# Patient Record
Sex: Female | Born: 1950 | Race: Black or African American | Hispanic: No | Marital: Single | Smoking: Never smoker
Health system: Southern US, Community
[De-identification: ages and names within clinical notes are randomized; demographics above are authoritative.]

## PROBLEM LIST (undated history)

## (undated) DIAGNOSIS — N390 Urinary tract infection, site not specified: Secondary | ICD-10-CM

## (undated) DIAGNOSIS — I10 Essential (primary) hypertension: Secondary | ICD-10-CM

## (undated) DIAGNOSIS — T8859XA Other complications of anesthesia, initial encounter: Secondary | ICD-10-CM

## (undated) DIAGNOSIS — T884XXA Failed or difficult intubation, initial encounter: Secondary | ICD-10-CM

## (undated) DIAGNOSIS — D649 Anemia, unspecified: Secondary | ICD-10-CM

## (undated) DIAGNOSIS — Z9889 Other specified postprocedural states: Secondary | ICD-10-CM

## (undated) DIAGNOSIS — R112 Nausea with vomiting, unspecified: Secondary | ICD-10-CM

## (undated) DIAGNOSIS — C259 Malignant neoplasm of pancreas, unspecified: Secondary | ICD-10-CM

## (undated) DIAGNOSIS — R109 Unspecified abdominal pain: Secondary | ICD-10-CM

## (undated) DIAGNOSIS — T4145XA Adverse effect of unspecified anesthetic, initial encounter: Secondary | ICD-10-CM

## (undated) HISTORY — PX: TUBAL LIGATION: SHX77

## (undated) HISTORY — DX: Unspecified abdominal pain: R10.9

## (undated) HISTORY — PX: OOPHORECTOMY: SHX86

## (undated) HISTORY — PX: CHOLECYSTECTOMY: SHX55

---

## 2015-12-14 DIAGNOSIS — N281 Cyst of kidney, acquired: Secondary | ICD-10-CM | POA: Insufficient documentation

## 2015-12-14 DIAGNOSIS — I10 Essential (primary) hypertension: Secondary | ICD-10-CM | POA: Insufficient documentation

## 2016-05-18 DIAGNOSIS — D4959 Neoplasm of unspecified behavior of other genitourinary organ: Secondary | ICD-10-CM | POA: Insufficient documentation

## 2017-06-19 DIAGNOSIS — K8020 Calculus of gallbladder without cholecystitis without obstruction: Secondary | ICD-10-CM | POA: Insufficient documentation

## 2017-07-24 DIAGNOSIS — N183 Chronic kidney disease, stage 3 unspecified: Secondary | ICD-10-CM | POA: Insufficient documentation

## 2017-09-19 DIAGNOSIS — M4800 Spinal stenosis, site unspecified: Secondary | ICD-10-CM | POA: Insufficient documentation

## 2017-10-17 ENCOUNTER — Encounter: Payer: Self-pay | Admitting: Emergency Medicine

## 2017-10-17 ENCOUNTER — Emergency Department: Payer: Medicare Other

## 2017-10-17 ENCOUNTER — Emergency Department
Admission: EM | Admit: 2017-10-17 | Discharge: 2017-10-17 | Disposition: A | Discharge status: Home / Self Care | Payer: Medicare Other | Source: Home / Self Care | Attending: Emergency Medicine | Admitting: Emergency Medicine

## 2017-10-17 ENCOUNTER — Other Ambulatory Visit: Payer: Self-pay

## 2017-10-17 DIAGNOSIS — K8689 Other specified diseases of pancreas: Secondary | ICD-10-CM

## 2017-10-17 DIAGNOSIS — C259 Malignant neoplasm of pancreas, unspecified: Secondary | ICD-10-CM | POA: Diagnosis not present

## 2017-10-17 DIAGNOSIS — N2889 Other specified disorders of kidney and ureter: Secondary | ICD-10-CM | POA: Diagnosis not present

## 2017-10-17 DIAGNOSIS — M545 Low back pain: Secondary | ICD-10-CM | POA: Insufficient documentation

## 2017-10-17 DIAGNOSIS — N3000 Acute cystitis without hematuria: Secondary | ICD-10-CM

## 2017-10-17 DIAGNOSIS — K869 Disease of pancreas, unspecified: Secondary | ICD-10-CM

## 2017-10-17 DIAGNOSIS — N390 Urinary tract infection, site not specified: Secondary | ICD-10-CM

## 2017-10-17 LAB — COMPREHENSIVE METABOLIC PANEL
ALBUMIN: 4.1 g/dL (ref 3.5–5.0)
ALK PHOS: 111 U/L (ref 38–126)
ALT: 18 U/L (ref 0–44)
AST: 26 U/L (ref 15–41)
Anion gap: 13 (ref 5–15)
BILIRUBIN TOTAL: 0.8 mg/dL (ref 0.3–1.2)
BUN: 18 mg/dL (ref 8–23)
CALCIUM: 9.7 mg/dL (ref 8.9–10.3)
CO2: 21 mmol/L — ABNORMAL LOW (ref 22–32)
Chloride: 108 mmol/L (ref 98–111)
Creatinine, Ser: 1.24 mg/dL — ABNORMAL HIGH (ref 0.44–1.00)
GFR calc Af Amer: 51 mL/min — ABNORMAL LOW (ref >60)
GFR, EST NON AFRICAN AMERICAN: 44 mL/min — AB (ref >60)
GLUCOSE: 130 mg/dL — AB (ref 70–99)
POTASSIUM: 4 mmol/L (ref 3.5–5.1)
Sodium: 142 mmol/L (ref 135–145)
TOTAL PROTEIN: 8.3 g/dL — AB (ref 6.5–8.1)

## 2017-10-17 LAB — URINALYSIS, COMPLETE (UACMP) WITH MICROSCOPIC
Bilirubin Urine: NEGATIVE
GLUCOSE, UA: NEGATIVE mg/dL
Hgb urine dipstick: NEGATIVE
KETONES UR: 5 mg/dL — AB
NITRITE: NEGATIVE
PROTEIN: NEGATIVE mg/dL
Specific Gravity, Urine: 1.01 (ref 1.005–1.030)
pH: 5 (ref 5.0–8.0)

## 2017-10-17 LAB — CBC WITH DIFFERENTIAL/PLATELET
BASOS ABS: 0.1 10*3/uL (ref 0–0.1)
Basophils Relative: 1 %
Eosinophils Absolute: 0 10*3/uL (ref 0–0.7)
Eosinophils Relative: 0 %
HEMATOCRIT: 37.5 % (ref 35.0–47.0)
Hemoglobin: 13 g/dL (ref 12.0–16.0)
LYMPHS PCT: 26 %
Lymphs Abs: 3.8 10*3/uL — ABNORMAL HIGH (ref 1.0–3.6)
MCH: 32 pg (ref 26.0–34.0)
MCHC: 34.7 g/dL (ref 32.0–36.0)
MCV: 92.4 fL (ref 80.0–100.0)
MONO ABS: 0.6 10*3/uL (ref 0.2–0.9)
MONOS PCT: 4 %
NEUTROS ABS: 10.4 10*3/uL — AB (ref 1.4–6.5)
Neutrophils Relative %: 69 %
Platelets: 345 10*3/uL (ref 150–440)
RBC: 4.05 MIL/uL (ref 3.80–5.20)
RDW: 14.5 % (ref 11.5–14.5)
WBC: 14.9 10*3/uL — ABNORMAL HIGH (ref 3.6–11.0)

## 2017-10-17 LAB — TROPONIN I: Troponin I: <0.03 ng/mL (ref <0.03)

## 2017-10-17 LAB — LIPASE, BLOOD: LIPASE: 42 U/L (ref 11–51)

## 2017-10-17 MED ORDER — CEPHALEXIN 250 MG PO CAPS: 250.0000 mg | ORAL_CAPSULE | Freq: Four times a day (QID) | ORAL | 0 refills | Status: AC

## 2017-10-17 MED ORDER — ONDANSETRON 4 MG PO TBDP: 4.0000 mg | ORAL_TABLET | Freq: Three times a day (TID) | ORAL | 0 refills | Status: DC | PRN

## 2017-10-17 MED ORDER — HYDROMORPHONE HCL 1 MG/ML IJ SOLN
1.0000 mg | Freq: Once | INTRAMUSCULAR | Status: AC
  Administered 2017-10-17: 1 mg via INTRAVENOUS
  Filled 2017-10-17: qty 1

## 2017-10-17 MED ORDER — IOPAMIDOL (ISOVUE-300) INJECTION 61%
100.0000 mL | Freq: Once | INTRAVENOUS | Status: AC | PRN
  Administered 2017-10-17: 100 mL via INTRAVENOUS

## 2017-10-17 MED ORDER — ONDANSETRON HCL 4 MG/2ML IJ SOLN
4.0000 mg | Freq: Once | INTRAMUSCULAR | Status: AC
  Administered 2017-10-17: 4 mg via INTRAVENOUS
  Filled 2017-10-17: qty 2

## 2017-10-17 MED ORDER — HYDROMORPHONE HCL 2 MG PO TABS: 2.0000 mg | ORAL_TABLET | Freq: Two times a day (BID) | ORAL | 0 refills | Status: DC | PRN

## 2017-10-17 MED ORDER — SODIUM CHLORIDE 0.9 % IV SOLN
1.0000 g | Freq: Once | INTRAVENOUS | Status: AC
  Administered 2017-10-17: 1 g via INTRAVENOUS
  Filled 2017-10-17: qty 10

## 2017-10-17 MED ORDER — SODIUM CHLORIDE 0.9 % IV SOLN
1000.0000 mL | Freq: Once | INTRAVENOUS | Status: AC
  Administered 2017-10-17: 1000 mL via INTRAVENOUS

## 2017-10-17 MED ORDER — MORPHINE SULFATE (PF) 4 MG/ML IV SOLN
4.0000 mg | Freq: Once | INTRAVENOUS | Status: AC
  Administered 2017-10-17: 4 mg via INTRAVENOUS
  Filled 2017-10-17: qty 1

## 2017-10-17 MED ORDER — SENNA 8.6 MG PO TABS: 1.0000 | ORAL_TABLET | Freq: Every day | ORAL | 0 refills | Status: DC

## 2017-10-17 NOTE — ED Provider Notes (Signed)
Patient was discussed with oncology and she will be seen on Friday for further evaluation.  We will discharge her with pain medicine and antibiotics until outpatient follow-up.   Earleen Newport, MD 10/17/17 1101

## 2017-10-17 NOTE — ED Notes (Signed)
Pt was unable to obtain urine specimen at this time.

## 2017-10-17 NOTE — ED Provider Notes (Signed)
[facility] Emergency Department Provider Note       Time seen: ----------------------------------------- 7:57 AM on 10/17/2017 -----------------------------------------   I have reviewed the triage vital signs and the nursing notes.  HISTORY   Chief Complaint Back Pain    HPI Cindy Bowman is a 67 y.o. female with a history of hypertension and chronic kidney disease who presents to the ED for mid to lower back pain for the past few days is worsening today.  She has no history of kidney stones but recently had hematuria and was treated for UTI approximately 3 weeks ago.  Patient states as far she knows the urinary symptoms improved.  She states that she does have a history of back pain and she has been having on and off problems since April.  She denies fevers but has had vomiting this morning.  Denies diarrhea.  History reviewed. No pertinent past medical history.  There are no active problems to display for this patient.   History reviewed. No pertinent surgical history.  Allergies Penicillins  Social History Social History   Tobacco Use  . Smoking status: Never Smoker  . Smokeless tobacco: Never Used  Substance Use Topics  . Alcohol use: Not Currently  . Drug use: Not on file   Review of Systems Constitutional: Negative for fever. Cardiovascular: Negative for chest pain. Respiratory: Negative for shortness of breath. Gastrointestinal: Negative for abdominal pain, positive for vomiting Genitourinary: Negative for dysuria. Musculoskeletal: Positive for back pain Skin: Negative for rash. Neurological: Negative for headaches, focal weakness or numbness.  All systems negative/normal/unremarkable except as stated in the HPI  ____________________________________________   PHYSICAL EXAM:  VITAL SIGNS: ED Triage Vitals  Enc Vitals Group     BP 10/17/17 0749 (!) 141/80     Pulse Rate 10/17/17 0749 (!) 103     Resp --      Temp 10/17/17  0749 98.3 F (36.8 C)     Temp Source 10/17/17 0749 Oral     SpO2 10/17/17 0749 100 %     Weight 10/17/17 0753 177 lb (80.3 kg)     Height 10/17/17 0753 5\' 3"  (1.6 m)     Head Circumference --      Peak Flow --      Pain Score 10/17/17 0753 10     Pain Loc --      Pain Edu? --      Excl. in [place]? --    Constitutional: Alert and oriented.  Tearful, mild distress Eyes: Conjunctivae are normal. Normal extraocular movements. ENT   Head: Normocephalic and atraumatic.   Nose: No congestion/rhinnorhea.   Mouth/Throat: Mucous membranes are moist.   Neck: No stridor. Cardiovascular: Normal rate, regular rhythm. No murmurs, rubs, or gallops. Respiratory: Normal respiratory effort without tachypnea nor retractions. Breath sounds are clear and equal bilaterally. No wheezes/rales/rhonchi. Gastrointestinal: Soft and nontender. Normal bowel sounds Musculoskeletal: Midline lumbar spine tenderness, left-sided thoracic tenderness. Neurologic:  Normal speech and language. No gross focal neurologic deficits are appreciated.  Skin:  Skin is warm, dry and intact. No rash noted. Psychiatric: Mood and affect are normal.  ____________________________________________  ED COURSE:  As part of my medical decision making, I reviewed the following data within the [place] History obtained from family if available, nursing notes, old chart and ekg, as well as notes from prior ED visits. Patient presented for worsening back pain, we will assess with labs and imaging as indicated at this time.   Procedures  ____________________________________________   LABS (pertinent positives/negatives)  Labs Reviewed  CBC WITH DIFFERENTIAL/PLATELET - Abnormal; Notable for the following components:      Result Value   WBC 14.9 (*)    Neutro Abs 10.4 (*)    Lymphs Abs 3.8 (*)    All other components within normal limits  COMPREHENSIVE METABOLIC PANEL - Abnormal; Notable for the following  components:   CO2 21 (*)    Glucose, Bld 130 (*)    Creatinine, Ser 1.24 (*)    Total Protein 8.3 (*)    GFR calc non Af Amer 44 (*)    GFR calc Af Amer 51 (*)    All other components within normal limits  URINALYSIS, COMPLETE (UACMP) WITH MICROSCOPIC - Abnormal; Notable for the following components:   Color, Urine YELLOW (*)    APPearance HAZY (*)    Ketones, ur 5 (*)    Leukocytes, UA LARGE (*)    WBC, UA >50 (*)    Bacteria, UA RARE (*)    All other components within normal limits  LIPASE, BLOOD  TROPONIN I    RADIOLOGY Images were viewed by me  CT renal protocol CT the abdomen pelvis with contrast IMPRESSION: 1. Hypoenhancing 3.0 cm mass of the uncinate process of the pancreas. The lesion abuts the SMV, SMA, and left renal vein without true encasement at this time. Appearance suspicious for pancreatic adenocarcinoma, focal pancreatitis considered a less likely differential diagnostic consideration. Metastatic disease from the left renal mass is likewise considered less likely given the difference in overall characteristics between the pancreatic and renal lesions. Further characteristics of the pancreatic tumor are enumerated above. 2. There is also a 2.8 cm enhancing exophytic mass of the left mid kidney compatible with renal cell carcinoma. No tumor thrombus in the left renal vein or adjacent periaortic adenopathy. 3. No evidence of hepatic or other metastatic lesions. 4. Other imaging findings of potential clinical significance: Diffuse hepatic steatosis. Descending and sigmoid colon diverticulosis. Trace free pelvic fluid. Umbilical hernia contains adipose tissue.  IMPRESSION: 1. Hypoenhancing 3.0 cm mass of the uncinate process of the pancreas. The lesion abuts the SMV, SMA, and left renal vein without true encasement at this time. Appearance suspicious for pancreatic adenocarcinoma, focal pancreatitis considered a less likely differential diagnostic  consideration. Metastatic disease from the left renal mass is likewise considered less likely given the difference in overall characteristics between the pancreatic and renal lesions. Further characteristics of the pancreatic tumor are enumerated above. 2. There is also a 2.8 cm enhancing exophytic mass of the left mid kidney compatible with renal cell carcinoma. No tumor thrombus in the left renal vein or adjacent periaortic adenopathy. 3. No evidence of hepatic or other metastatic lesions. 4. Other imaging findings of potential clinical significance: Diffuse hepatic steatosis. Descending and sigmoid colon diverticulosis. Trace free pelvic fluid. Umbilical hernia contains adipose tissue. ____________________________________________  DIFFERENTIAL DIAGNOSIS   Renal colic, UTI, pyelonephritis, chronic pain, degenerative disc disease  FINAL ASSESSMENT AND PLAN  Back pain, pancreatic mass, UTI   Plan: The patient had presented for worsening low back pain. Patient's labs do reveal leukocytosis as well as a mildly elevated creatinine. Patient's imaging as dictated above.  There is a concerning masslike area on her pancreas.  There is also likely UTI.  I will discuss with oncology for a plan going forward.   Laurence Aly, MD   Note: This note was generated in part or whole with voice recognition software. Voice recognition is  usually quite accurate but there are transcription errors that can and very often do occur. I apologize for any typographical errors that were not detected and corrected.     Earleen Newport, MD 10/17/17 1037

## 2017-10-17 NOTE — ED Notes (Signed)
Patient transported to CT 

## 2017-10-17 NOTE — ED Triage Notes (Signed)
Pain in mid to lower back for the past few days, worsening today, no history of kidney stones, however, son thinks that might be the issue, denies burning with urination, has had blood in her urine recently.

## 2017-10-18 ENCOUNTER — Other Ambulatory Visit: Payer: Self-pay

## 2017-10-18 ENCOUNTER — Encounter: Payer: Self-pay | Admitting: *Deleted

## 2017-10-18 ENCOUNTER — Telehealth: Payer: Self-pay | Admitting: *Deleted

## 2017-10-18 ENCOUNTER — Inpatient Hospital Stay
Admission: EM | Admit: 2017-10-18 | Discharge: 2017-10-19 | DRG: 437 | Disposition: A | Discharge status: Home / Self Care | Payer: Medicare Other | Attending: Internal Medicine | Admitting: Internal Medicine

## 2017-10-18 DIAGNOSIS — I129 Hypertensive chronic kidney disease with stage 1 through stage 4 chronic kidney disease, or unspecified chronic kidney disease: Secondary | ICD-10-CM | POA: Diagnosis present

## 2017-10-18 DIAGNOSIS — M5134 Other intervertebral disc degeneration, thoracic region: Secondary | ICD-10-CM | POA: Diagnosis present

## 2017-10-18 DIAGNOSIS — Z885 Allergy status to narcotic agent status: Secondary | ICD-10-CM | POA: Diagnosis not present

## 2017-10-18 DIAGNOSIS — Z8249 Family history of ischemic heart disease and other diseases of the circulatory system: Secondary | ICD-10-CM | POA: Diagnosis not present

## 2017-10-18 DIAGNOSIS — N2889 Other specified disorders of kidney and ureter: Secondary | ICD-10-CM | POA: Diagnosis present

## 2017-10-18 DIAGNOSIS — K8689 Other specified diseases of pancreas: Secondary | ICD-10-CM

## 2017-10-18 DIAGNOSIS — N189 Chronic kidney disease, unspecified: Secondary | ICD-10-CM | POA: Diagnosis present

## 2017-10-18 DIAGNOSIS — C259 Malignant neoplasm of pancreas, unspecified: Secondary | ICD-10-CM | POA: Diagnosis present

## 2017-10-18 DIAGNOSIS — C250 Malignant neoplasm of head of pancreas: Secondary | ICD-10-CM | POA: Diagnosis present

## 2017-10-18 DIAGNOSIS — R52 Pain, unspecified: Secondary | ICD-10-CM

## 2017-10-18 DIAGNOSIS — K869 Disease of pancreas, unspecified: Secondary | ICD-10-CM | POA: Diagnosis not present

## 2017-10-18 DIAGNOSIS — Z886 Allergy status to analgesic agent status: Secondary | ICD-10-CM | POA: Diagnosis not present

## 2017-10-18 DIAGNOSIS — R109 Unspecified abdominal pain: Secondary | ICD-10-CM | POA: Diagnosis not present

## 2017-10-18 HISTORY — DX: Essential (primary) hypertension: I10

## 2017-10-18 LAB — CBC WITH DIFFERENTIAL/PLATELET
BASOS ABS: 0.2 10*3/uL — AB (ref 0–0.1)
BASOS PCT: 1 %
EOS PCT: 1 %
Eosinophils Absolute: 0.1 10*3/uL (ref 0–0.7)
HCT: 37.7 % (ref 35.0–47.0)
Hemoglobin: 12.7 g/dL (ref 12.0–16.0)
Lymphocytes Relative: 31 %
Lymphs Abs: 5 10*3/uL — ABNORMAL HIGH (ref 1.0–3.6)
MCH: 31.4 pg (ref 26.0–34.0)
MCHC: 33.7 g/dL (ref 32.0–36.0)
MCV: 92.9 fL (ref 80.0–100.0)
MONO ABS: 0.9 10*3/uL (ref 0.2–0.9)
Monocytes Relative: 6 %
Neutro Abs: 9.7 10*3/uL — ABNORMAL HIGH (ref 1.4–6.5)
Neutrophils Relative %: 61 %
Platelets: 360 10*3/uL (ref 150–440)
RBC: 4.06 MIL/uL (ref 3.80–5.20)
RDW: 14.4 % (ref 11.5–14.5)
WBC: 15.9 10*3/uL — ABNORMAL HIGH (ref 3.6–11.0)

## 2017-10-18 LAB — COMPREHENSIVE METABOLIC PANEL
ALBUMIN: 4.1 g/dL (ref 3.5–5.0)
ALK PHOS: 115 U/L (ref 38–126)
ALT: 20 U/L (ref 0–44)
AST: 30 U/L (ref 15–41)
Anion gap: 14 (ref 5–15)
BILIRUBIN TOTAL: 0.8 mg/dL (ref 0.3–1.2)
BUN: 15 mg/dL (ref 8–23)
CALCIUM: 9.5 mg/dL (ref 8.9–10.3)
CO2: 22 mmol/L (ref 22–32)
CREATININE: 1.23 mg/dL — AB (ref 0.44–1.00)
Chloride: 104 mmol/L (ref 98–111)
GFR calc Af Amer: 51 mL/min — ABNORMAL LOW (ref >60)
GFR calc non Af Amer: 44 mL/min — ABNORMAL LOW (ref >60)
GLUCOSE: 106 mg/dL — AB (ref 70–99)
Potassium: 4 mmol/L (ref 3.5–5.1)
Sodium: 140 mmol/L (ref 135–145)
TOTAL PROTEIN: 8.3 g/dL — AB (ref 6.5–8.1)

## 2017-10-18 LAB — LIPASE, BLOOD: Lipase: 42 U/L (ref 11–51)

## 2017-10-18 LAB — URINALYSIS, COMPLETE (UACMP) WITH MICROSCOPIC
Bilirubin Urine: NEGATIVE
GLUCOSE, UA: NEGATIVE mg/dL
Hgb urine dipstick: NEGATIVE
Ketones, ur: 5 mg/dL — AB
NITRITE: NEGATIVE
PH: 5 (ref 5.0–8.0)
PROTEIN: 30 mg/dL — AB
Specific Gravity, Urine: 1.033 — ABNORMAL HIGH (ref 1.005–1.030)

## 2017-10-18 LAB — CA 19-9 (SERIAL): CA 19-9: 78 U/mL — ABNORMAL HIGH (ref 0–35)

## 2017-10-18 MED ORDER — SODIUM CHLORIDE 0.9 % IV SOLN
1.0000 g | Freq: Once | INTRAVENOUS | Status: AC
  Administered 2017-10-18: 1 g via INTRAVENOUS
  Filled 2017-10-18: qty 10

## 2017-10-18 MED ORDER — ONDANSETRON HCL 4 MG/2ML IJ SOLN
4.0000 mg | Freq: Once | INTRAMUSCULAR | Status: AC
  Administered 2017-10-18: 4 mg via INTRAVENOUS
  Filled 2017-10-18: qty 2

## 2017-10-18 MED ORDER — ONDANSETRON HCL 4 MG/2ML IJ SOLN
4.0000 mg | Freq: Four times a day (QID) | INTRAMUSCULAR | Status: DC | PRN
  Administered 2017-10-19: 4 mg via INTRAVENOUS
  Filled 2017-10-18: qty 2

## 2017-10-18 MED ORDER — ENOXAPARIN SODIUM 40 MG/0.4ML ~~LOC~~ SOLN
40.0000 mg | SUBCUTANEOUS | Status: DC
  Administered 2017-10-18: 40 mg via SUBCUTANEOUS
  Filled 2017-10-18: qty 0.4

## 2017-10-18 MED ORDER — OXYCODONE HCL ER 10 MG PO T12A
10.0000 mg | EXTENDED_RELEASE_TABLET | Freq: Two times a day (BID) | ORAL | Status: DC
  Administered 2017-10-18 – 2017-10-19 (×2): 10 mg via ORAL
  Filled 2017-10-18 (×2): qty 1

## 2017-10-18 MED ORDER — ONDANSETRON HCL 4 MG PO TABS: 4.0000 mg | ORAL_TABLET | Freq: Four times a day (QID) | ORAL | Status: DC | PRN

## 2017-10-18 MED ORDER — KETOROLAC TROMETHAMINE 30 MG/ML IJ SOLN
15.0000 mg | Freq: Once | INTRAMUSCULAR | Status: AC
  Administered 2017-10-18: 15 mg via INTRAVENOUS
  Filled 2017-10-18: qty 1

## 2017-10-18 MED ORDER — HYDROCODONE-ACETAMINOPHEN 7.5-325 MG PO TABS
1.0000 | ORAL_TABLET | Freq: Four times a day (QID) | ORAL | Status: DC | PRN
  Administered 2017-10-18: 21:00:00 1 via ORAL
  Filled 2017-10-18: qty 1

## 2017-10-18 MED ORDER — HYDROMORPHONE HCL 1 MG/ML IJ SOLN
1.0000 mg | INTRAMUSCULAR | Status: DC | PRN
  Administered 2017-10-19 (×3): 1 mg via INTRAVENOUS
  Filled 2017-10-18 (×3): qty 1

## 2017-10-18 MED ORDER — FENTANYL 25 MCG/HR TD PT72: 25.0000 ug | MEDICATED_PATCH | TRANSDERMAL | Status: DC

## 2017-10-18 MED ORDER — ACETAMINOPHEN 325 MG PO TABS: 650.0000 mg | ORAL_TABLET | Freq: Four times a day (QID) | ORAL | Status: DC | PRN

## 2017-10-18 MED ORDER — LOSARTAN POTASSIUM-HCTZ 50-12.5 MG PO TABS: 1.0000 | ORAL_TABLET | Freq: Every day | ORAL | Status: DC

## 2017-10-18 MED ORDER — HYDROCHLOROTHIAZIDE 12.5 MG PO CAPS
12.5000 mg | ORAL_CAPSULE | Freq: Every day | ORAL | Status: DC
  Administered 2017-10-18 – 2017-10-19 (×2): 12.5 mg via ORAL
  Filled 2017-10-18 (×2): qty 1

## 2017-10-18 MED ORDER — ACETAMINOPHEN 650 MG RE SUPP: 650.0000 mg | Freq: Four times a day (QID) | RECTAL | Status: DC | PRN

## 2017-10-18 MED ORDER — LOSARTAN POTASSIUM 50 MG PO TABS
50.0000 mg | ORAL_TABLET | Freq: Every day | ORAL | Status: DC
  Administered 2017-10-18 – 2017-10-19 (×2): 50 mg via ORAL
  Filled 2017-10-18 (×2): qty 1

## 2017-10-18 MED ORDER — HYDROMORPHONE HCL 1 MG/ML IJ SOLN
1.0000 mg | Freq: Once | INTRAMUSCULAR | Status: AC
  Administered 2017-10-18: 1 mg via INTRAVENOUS
  Filled 2017-10-18: qty 1

## 2017-10-18 MED ORDER — SODIUM CHLORIDE 0.9 % IV BOLUS
500.0000 mL | Freq: Once | INTRAVENOUS | Status: AC
  Administered 2017-10-18: 500 mL via INTRAVENOUS

## 2017-10-18 NOTE — ED Triage Notes (Signed)
Increasing low back and also pointing to right flank pain.  Some dry heaving.  Here yesterday.

## 2017-10-18 NOTE — H&P (Signed)
[facility] [place] NAME: Cindy Bowman    MR#:  [id number]  DATE OF BIRTH:  [birth year]  DATE OF ADMISSION:  10/18/2017  PRIMARY CARE PHYSICIAN: Glendon Axe, MD   REQUESTING/REFERRING PHYSICIAN: Dr. Charlotte Crumb  CHIEF COMPLAINT:   Chief Complaint  Patient presents with  . Back Pain    HISTORY OF PRESENT ILLNESS:  Cindy Bowman  is a 67 y.o. female with a known history of essential hypertension who presents to the hospital complaining of severe back pain.  Patient says she has been having back pain ongoing for the past few months and has had an extensive work-up including MRI of her lumbar and thoracic spine which showed some degenerative disc disease but no other acute pathology.  She continues to have significant back pain which was excruciating so she came to the ER for further evaluation yesterday.  Patient had a CT of her abdomen pelvis and also a kidney stone CT which showed evidence of a possible pancreatic mass and also a renal mass.  She was started on some oral Dilaudid and discharged home with outpatient follow-up with oncology.  Overnight her pain has gotten significantly worse and therefore she came back to the ER for further evaluation.  Despite getting multiple doses of IV Dilaudid patient is still having significant excruciating back/abdominal pain therefore hospitalist services were contacted for admission.  Patient does admit to a weight loss about 10 pounds over the past few months, she denies any night sweats, hematuria, admits to some mild nausea but no vomiting.  She also admits to poor p.o. intake ongoing for the past few months.   PAST MEDICAL HISTORY:   Past Medical History:  Diagnosis Date  . Essential hypertension     PAST SURGICAL HISTORY:  History reviewed. No pertinent surgical history.  SOCIAL HISTORY:   Social History   Tobacco Use  . Smoking status: Never Smoker  . Smokeless tobacco: Never Used  Substance  Use Topics  . Alcohol use: Not Currently    FAMILY HISTORY:   Family History  Problem Relation Age of Onset  . Hypertension Mother   . Heart attack Father     DRUG ALLERGIES:   Allergies  Allergen Reactions  . Nsaids Other (See Comments)    Decreased GFR  . Ibuprofen     Lowers kidney function  . Penicillins     Yeast infection  Has patient had a PCN reaction causing immediate rash, facial/tongue/throat swelling, SOB or lightheadedness with hypotension: No Has patient had a PCN reaction causing severe rash involving mucus membranes or skin necrosis: No Has patient had a PCN reaction that required hospitalization: No Has patient had a PCN reaction occurring within the last 10 years: No If all of the above answers are "NO", then may proceed with Cephalosporin use.     REVIEW OF SYSTEMS:   Review of Systems  Constitutional: Negative for fever and weight loss.  HENT: Negative for congestion, nosebleeds and tinnitus.   Eyes: Negative for blurred vision, double vision and redness.  Respiratory: Negative for cough, hemoptysis and shortness of breath.   Cardiovascular: Negative for chest pain, orthopnea, leg swelling and PND.  Gastrointestinal: Positive for abdominal pain. Negative for diarrhea, melena, nausea and vomiting.  Genitourinary: Negative for dysuria, hematuria and urgency.  Musculoskeletal: Positive for back pain. Negative for falls and joint pain.  Neurological: Negative for dizziness, tingling, sensory change, focal weakness, seizures, weakness and headaches.  Endo/Heme/Allergies: Negative for  polydipsia. Does not bruise/bleed easily.  Psychiatric/Behavioral: Negative for depression and memory loss. The patient is not nervous/anxious.     MEDICATIONS AT HOME:   Prior to Admission medications   Medication Sig Start Date End Date Taking? Authorizing Provider  cephALEXin (KEFLEX) 250 MG capsule Take 1 capsule (250 mg total) by mouth 4 (four) times daily for 10  days. 10/17/17 10/27/17 Yes Earleen Newport, MD  HYDROmorphone (DILAUDID) 2 MG tablet Take 1 tablet (2 mg total) by mouth every 12 (twelve) hours as needed for severe pain. 10/17/17 10/17/18 Yes Earleen Newport, MD  losartan-hydrochlorothiazide (HYZAAR) 50-12.5 MG tablet Take 1 tablet by mouth daily. 08/27/17  Yes [provider]  ondansetron (ZOFRAN ODT) 4 MG disintegrating tablet Take 1 tablet (4 mg total) by mouth every 8 (eight) hours as needed for nausea or vomiting. 10/17/17  Yes Earleen Newport, MD  predniSONE (STERAPRED UNI-PAK 21 TAB) 5 MG (21) TBPK tablet Take by mouth as directed.  10/16/17 10/20/17 Yes [provider]  senna (SENOKOT) 8.6 MG TABS tablet Take 1 tablet (8.6 mg total) by mouth at bedtime. 10/17/17  Yes Earleen Newport, MD      VITAL SIGNS:  Blood pressure 128/68, pulse 69, temperature 98.2 F (36.8 C), temperature source Oral, resp. rate 15, height 5\' 3"  (1.6 m), weight 80.3 kg, SpO2 100 %.  PHYSICAL EXAMINATION:  Physical Exam  GENERAL:  67 y.o.-year-old patient lying in the bed with no acute distress.  EYES: Pupils equal, round, reactive to light and accommodation. No scleral icterus. Extraocular muscles intact.  HEENT: Head atraumatic, normocephalic. Oropharynx and nasopharynx clear. No oropharyngeal erythema, moist oral mucosa  NECK:  Supple, no jugular venous distention. No thyroid enlargement, no tenderness.  LUNGS: Normal breath sounds bilaterally, no wheezing, rales, rhonchi. No use of accessory muscles of respiration.  CARDIOVASCULAR: S1, S2 RRR. No murmurs, rubs, gallops, clicks.  ABDOMEN: Soft, nontender, nondistended. Bowel sounds present. No organomegaly or mass.  EXTREMITIES: No pedal edema, cyanosis, or clubbing. + 2 pedal & radial pulses b/l.   NEUROLOGIC: Cranial nerves II through XII are intact. No focal Motor or sensory deficits appreciated b/l PSYCHIATRIC: The patient is alert and oriented x 3.  SKIN: No obvious rash,  lesion, or ulcer.   LABORATORY PANEL:   CBC Recent Labs  Lab 10/18/17 1310  WBC 15.9*  HGB 12.7  HCT 37.7  PLT 360   ------------------------------------------------------------------------------------------------------------------  Chemistries  Recent Labs  Lab 10/18/17 1310  NA 140  K 4.0  CL 104  CO2 22  GLUCOSE 106*  BUN 15  CREATININE 1.23*  CALCIUM 9.5  AST 30  ALT 20  ALKPHOS 115  BILITOT 0.8   ------------------------------------------------------------------------------------------------------------------  Cardiac Enzymes Recent Labs  Lab 10/17/17 0809  TROPONINI <0.03   ------------------------------------------------------------------------------------------------------------------  RADIOLOGY:  Ct Abdomen Pelvis W Contrast  Result Date: 10/17/2017 CLINICAL DATA:  Pancreatic lesion on earlier noncontrast CT, for further assessment. EXAM: CT ABDOMEN AND PELVIS WITH CONTRAST TECHNIQUE: Multidetector CT imaging of the abdomen and pelvis was performed using the standard protocol following bolus administration of intravenous contrast. CONTRAST:  160mL ISOVUE-300 IOPAMIDOL (ISOVUE-300) INJECTION 61% COMPARISON:  10/17/2017 FINDINGS: Lower chest: Unremarkable Hepatobiliary: Diffuse hepatic steatosis. Cholecystectomy. No appreciable focal hepatic mass. Pancreas: There is a hypodense mass in the uncinate process of the pancreas suspicious for pancreatic adenocarcinoma. Size: 3.0 by 2.8 by 2.4 cm Location: Uncinate Characterization: Solid Enhancement: Hypoenhancing Other Characteristics: No calcification. Mildly ill-defined margins. Local extent of mass: No definite duodenal  invasion. Vascular Involvement: The mass abuts the posterior margin of the superior mesenteric artery and also abuts the posterior margin of the superior mesenteric vein. No overt encasement of either of the structures. The posterior margin of the mass slightly abuts the left renal vein. Variant  hepatic artery anatomy: Conventional Bile Duct Involvement: Absent Variant biliary anatomy: Not appreciated Adjacent Nodes: Portacaval node 0.8 cm in short axis on image 30/4, and somewhat indistinctly marginated. Peripancreatic node 0.6 cm in short axis on image 28/4. Omental/Peritoneal Disease: Absent Distant Metastases: Absent Spleen: Unremarkable Adrenals/Urinary Tract: 2.6 by 2.8 cm enhancing exophytic mass of the left mid kidney laterally within anterior cystic component but primarily solid, most compatible with a renal cell carcinoma. The adrenal glands appear normal. Suspected urethral diverticulum or periurethral cyst somewhat eccentric to the right. Small bladder cellule or diverticulum on the left. Stomach/Bowel: Wall thickening in the stomach antrum is most likely physiologic or due to contraction. Fatty prominence of the ileocecal valve. Descending and sigmoid colon diverticulosis. Upper normal diameter of the appendix at 7 mm. Vascular/Lymphatic: No tumor thrombus in the left renal vein. The pancreatic mass abuts the SMA, SMV, and left renal vein as noted above in the pancreas section. Small peripancreatic/porta hepatis lymph nodes as noted above. No overt pathologic periaortic adenopathy at this time. Reproductive: Unremarkable Other: Trace free pelvic fluid. Musculoskeletal: Umbilical hernia contains adipose tissue. IMPRESSION: 1. Hypoenhancing 3.0 cm mass of the uncinate process of the pancreas. The lesion abuts the SMV, SMA, and left renal vein without true encasement at this time. Appearance suspicious for pancreatic adenocarcinoma, focal pancreatitis considered a less likely differential diagnostic consideration. Metastatic disease from the left renal mass is likewise considered less likely given the difference in overall characteristics between the pancreatic and renal lesions. Further characteristics of the pancreatic tumor are enumerated above. 2. There is also a 2.8 cm enhancing exophytic  mass of the left mid kidney compatible with renal cell carcinoma. No tumor thrombus in the left renal vein or adjacent periaortic adenopathy. 3. No evidence of hepatic or other metastatic lesions. 4. Other imaging findings of potential clinical significance: Diffuse hepatic steatosis. Descending and sigmoid colon diverticulosis. Trace free pelvic fluid. Umbilical hernia contains adipose tissue. Electronically Signed   By: Van Clines M.D.   On: 10/17/2017 10:27   Ct Renal Stone Study  Result Date: 10/17/2017 CLINICAL DATA:  Mid to lower back pain on the left for a few days EXAM: CT ABDOMEN AND PELVIS WITHOUT CONTRAST TECHNIQUE: Multidetector CT imaging of the abdomen and pelvis was performed following the standard protocol without IV contrast. COMPARISON:  None. FINDINGS: Lower chest: Ground-glass density in the right lower lobe is attributed to scarring from bulky osteophytes. Hepatobiliary: Hepatic steatosis.Cholecystectomy with normal common bile duct diameter. Pancreas: Abnormal fullness at the level of the uncinate process with haziness of surrounding fat. Soft tissue indistinguishable from the proximal SMA. No ductal dilatation or evident collection. Spleen: Unremarkable. Adrenals/Urinary Tract: Negative adrenals. No hydronephrosis or stone. 2.7 cm left renal lesion measuring cystic density. Unremarkable bladder. There is a C-shaped low-density right of the urethra measuring 19 mm in length by 8 mm in thickness, most likely a urethral diverticulum. Stomach/Bowel: Extensive colonic diverticulosis distally. No obstruction . No appendicitis Vascular/Lymphatic: No acute vascular abnormality. No mass or adenopathy. Reproductive:No pathologic findings. Other: No ascites or pneumoperitoneum.  Fatty umbilical hernia. Musculoskeletal: No acute abnormalities. Spondylosis and disc degeneration. These results were called by telephone at the time of interpretation on 10/17/2017 at 8:55  am to Dr. Lenise Arena  , who verbally acknowledged these results. IMPRESSION: 1. Mass versus focal pancreatitis at the uncinate process, appearance more concerning for carcinoma than inflammation. Please correlate with serum enzymes and obtained postcontrast pancreas protocol CT. 2. Urethral diverticulum. 3. Hepatic steatosis. 4. Colonic diverticulosis. 5. Fatty supra umbilical hernia. Electronically Signed   By: Monte Fantasia M.D.   On: 10/17/2017 08:58     IMPRESSION AND PLAN:   67 year old female with past medical history of essential hypertension who presents to the hospital complaining of back pain/abdominal pain and was noted to have a pancreatic and left renal mass.  1.  Abdominal/back pain-secondary to the renal/pancreatic mass.  Patient was in the ER yesterday and diagnosed with a pancreatic/renal mass and referred to oncology as an outpatient and discharged on oral pain meds but got significantly worse overnight and therefore is being admitted to the hospital now for pain control. - I will start the patient on some fentanyl patch, continue IV Dilaudid and oral Norco for pain.  2.  Pancreatic/left renal mass- based on a CT of the abdomen pelvis as noted yesterday.  Suspicious for pancreatic cancer. -Patient's CA-19-9 is significantly elevated.  I will consult oncology.(Dr. Grayland Ormond)  3. Essential HTN - cont. losartan/HCTZ.  All the records are reviewed and case discussed with ED provider. Management plans discussed with the patient, family and they are in agreement.  CODE STATUS: Full code  TOTAL TIME TAKING CARE OF THIS PATIENT: 40 minutes.    Henreitta Leber M.D on 10/18/2017 at 2:55 PM  Between 7am to 6pm - Pager - [phone]  After 6pm go to [email or web address] - password [email or web address]  [place] [facility]  Office  [phone]  CC: Primary care physician; Glendon Axe, MD

## 2017-10-18 NOTE — Telephone Encounter (Signed)
Patient is being admitted to [facility]

## 2017-10-18 NOTE — Progress Notes (Signed)
   10/18/17 1950  Clinical Encounter Type  Visited With Patient and family together  Visit Type Initial  Referral From Nurse  Consult/Referral To Chaplain  Spiritual Encounters  Spiritual Needs Brochure  This author visited patient to follow up on an advanced directive consult visit. Patient was alert and awake upon arrival. Was leaning to her right and watching television. Family member was also present. Patient was interested in AD. This Pryor Curia informed that the earliest it could be completed would be next day. Patient is familiar with how AD's work. Provided pastoral care by being a non-anxious presence.  Follow up visit is desired.

## 2017-10-18 NOTE — Telephone Encounter (Signed)
Son called back, patient is in ER as she was in severe pain

## 2017-10-18 NOTE — ED Triage Notes (Signed)
  Pt to ED reporting increased right sided back pain that is radiating into the center of her back. Pt was seen in ED yesterday and was sent home with a dilauded prescription. Pt reports having taken 2mg  at 7am this morning and the pain continues to be unmanaged. Nausea and dry heaves reported. No diarrhea. No changes in urine .

## 2017-10-18 NOTE — ED Notes (Signed)
Pt going to inpt room 131 - upon transport pt alert & oriented x4, ABCs intact, NAD.

## 2017-10-18 NOTE — ED Provider Notes (Signed)
[facility] Emergency Department Provider Note  ____________________________________________   I have reviewed the triage vital signs and the nursing notes. Where available I have reviewed prior notes and, if possible and indicated, outside hospital notes.    HISTORY  Chief Complaint Back Pain    HPI Cindy Bowman is a 67 y.o. female  Was seen here yesterday, for flank and back pain, please see that note.  Patient was sent home for pain medications after diagnosis of likely pancreatic cancer and renal cell carcinoma was made.  She states she is been taking the medications but they does not touch her pain and she is in significant discomfort.  She states that she has had some dry heaving but no vomiting.  She denies any fever, she denies any diarrhea.  She is very uncomfortable.  Pain is in her stomach and in her back.  Nothing makes it better nothing makes worse no radiation no other alleviating or aggravating symptoms,     History reviewed. No pertinent past medical history.  There are no active problems to display for this patient.   History reviewed. No pertinent surgical history.  Prior to Admission medications   Medication Sig Start Date End Date Taking? Authorizing Provider  cephALEXin (KEFLEX) 250 MG capsule Take 1 capsule (250 mg total) by mouth 4 (four) times daily for 10 days. 10/17/17 10/27/17  Earleen Newport, MD  HYDROmorphone (DILAUDID) 2 MG tablet Take 1 tablet (2 mg total) by mouth every 12 (twelve) hours as needed for severe pain. 10/17/17 10/17/18  Earleen Newport, MD  losartan-hydrochlorothiazide (HYZAAR) 50-12.5 MG tablet Take 1 tablet by mouth daily. 08/27/17   [provider]  ondansetron (ZOFRAN ODT) 4 MG disintegrating tablet Take 1 tablet (4 mg total) by mouth every 8 (eight) hours as needed for nausea or vomiting. 10/17/17   Earleen Newport, MD  predniSONE (STERAPRED UNI-PAK 21 TAB) 5 MG (21) TBPK tablet Take by mouth  as directed.  10/16/17 10/20/17  [provider]  senna (SENOKOT) 8.6 MG TABS tablet Take 1 tablet (8.6 mg total) by mouth at bedtime. 10/17/17   Earleen Newport, MD    Allergies Ibuprofen and Penicillins  History reviewed. No pertinent family history.  Social History Social History   Tobacco Use  . Smoking status: Never Smoker  . Smokeless tobacco: Never Used  Substance Use Topics  . Alcohol use: Not Currently  . Drug use: Not on file    Review of Systems Constitutional: No fever/chills Eyes: No visual changes. ENT: No sore throat. No stiff neck no neck pain Cardiovascular: Denies chest pain. Respiratory: Denies shortness of breath. Gastrointestinal:   HPI regarding vomiting.  No diarrhea.  No constipation. Genitourinary: Negative for dysuria. Musculoskeletal: Negative lower extremity swelling Skin: Negative for rash. Neurological: Negative for severe headaches, focal weakness or numbness.   ____________________________________________   PHYSICAL EXAM:  VITAL SIGNS: ED Triage Vitals  Enc Vitals Group     BP 10/18/17 1221 131/60     Pulse Rate 10/18/17 1221 84     Resp 10/18/17 1221 14     Temp 10/18/17 1221 98.2 F (36.8 C)     Temp Source 10/18/17 1221 Oral     SpO2 10/18/17 1221 100 %     Weight 10/18/17 1222 177 lb (80.3 kg)     Height 10/18/17 1222 5\' 3"  (1.6 m)     Head Circumference --      Peak Flow --  Pain Score 10/18/17 1232 10     Pain Loc --      Pain Edu? --      Excl. in [place]? --     Constitutional: Alert and oriented.  Patient bending over the bed, crying uncomfortable, but not medically toxic in appearance Eyes: Conjunctivae are normal Head: Atraumatic HEENT: No congestion/rhinnorhea. Mucous membranes are moist.  Oropharynx non-erythematous Neck:   Nontender with no meningismus, no masses, no stridor Cardiovascular: Normal rate, regular rhythm. Grossly normal heart sounds.  Good peripheral circulation. Respiratory: Normal  respiratory effort.  No retractions. Lungs CTAB. Abdominal: Soft and nontender. No distention. No guarding no rebound Back:   Tender to palpation diffusely across the mid back, does not seem to involve the spinal coloumn There is R CVA tenderness  Musculoskeletal: No lower extremity tenderness, no upper extremity tenderness. No joint effusions, no DVT signs strong distal pulses no edema Neurologic:  Normal speech and language. No gross focal neurologic deficits are appreciated.  Skin:  Skin is warm, dry and intact. No rash noted. Psychiatric: Mood and affect are anxious. Speech and behavior are normal.  ____________________________________________   LABS (all labs ordered are listed, but only abnormal results are displayed)  Labs Reviewed  COMPREHENSIVE METABOLIC PANEL  CBC WITH DIFFERENTIAL/PLATELET  LIPASE, BLOOD  URINALYSIS, COMPLETE (UACMP) WITH MICROSCOPIC    Pertinent labs  results that were available during my care of the patient were reviewed by me and considered in my medical decision making (see chart for details). ____________________________________________  EKG  I personally interpreted any EKGs ordered by me or triage  ____________________________________________  RADIOLOGY  Pertinent labs & imaging results that were available during my care of the patient were reviewed by me and considered in my medical decision making (see chart for details). If possible, patient and/or family made aware of any abnormal findings.  No results found. ____________________________________________    PROCEDURES  Procedure(s) performed: None  Procedures  Critical Care performed: None  ____________________________________________   INITIAL IMPRESSION / ASSESSMENT AND PLAN / ED COURSE  Pertinent labs & imaging results that were available during my care of the patient were reviewed by me and considered in my medical decision making (see chart for details).  And very  poorly controlled pain from 2 different cancers were diagnosed yesterday we have given her Dilaudid already here, and at this time she is feeling more comfortable but really not at ease.  We will also try Toradol.  Patient has an intolerance to ibuprofen which is related apparently to kidney function, creatinine issues 1.29.  I think in the interest of pain control a half dose of Toradol would certainly not be contraindicated.  No evidence of bleeding or other reason to withhold it.  Patient is in a great deal of pain.  If we cannot get her pain better controlled we will likely have to admit her.    ____________________________________________   FINAL CLINICAL IMPRESSION(S) / ED DIAGNOSES  Final diagnoses:  None      This chart was dictated using voice recognition software.  Despite best efforts to proofread,  errors can occur which can change meaning.      Schuyler Amor, MD 10/18/17 1327

## 2017-10-18 NOTE — Telephone Encounter (Signed)
Patient in pain and was given medicine in ER she can only take every 12 hours but it is not controlling her pain beyond 7 hours and he wants to know if we can ok for her to take it more frequently or put her on something else. She has her first appointment with Dr Grayland Ormond tomorrow in [place]. Or he is asking if she needs to be seen sooner than tomorrow. Please advise

## 2017-10-18 NOTE — Telephone Encounter (Signed)
SHe can take it every 8 hrs and I will re-evaluate in the AM.

## 2017-10-18 NOTE — ED Notes (Signed)
[facility] MD at bedside.

## 2017-10-18 NOTE — Telephone Encounter (Signed)
Call returned to son, I had to leave a message for him to return my call

## 2017-10-19 ENCOUNTER — Telehealth: Payer: Self-pay

## 2017-10-19 ENCOUNTER — Other Ambulatory Visit: Payer: Self-pay

## 2017-10-19 ENCOUNTER — Inpatient Hospital Stay: Payer: Medicare Other | Admitting: Oncology

## 2017-10-19 DIAGNOSIS — Z885 Allergy status to narcotic agent status: Secondary | ICD-10-CM

## 2017-10-19 DIAGNOSIS — N2889 Other specified disorders of kidney and ureter: Secondary | ICD-10-CM

## 2017-10-19 DIAGNOSIS — Z886 Allergy status to analgesic agent status: Secondary | ICD-10-CM

## 2017-10-19 DIAGNOSIS — K869 Disease of pancreas, unspecified: Secondary | ICD-10-CM

## 2017-10-19 DIAGNOSIS — R109 Unspecified abdominal pain: Secondary | ICD-10-CM

## 2017-10-19 LAB — BASIC METABOLIC PANEL
ANION GAP: 8 (ref 5–15)
BUN: 17 mg/dL (ref 8–23)
CALCIUM: 8.7 mg/dL — AB (ref 8.9–10.3)
CO2: 26 mmol/L (ref 22–32)
Chloride: 108 mmol/L (ref 98–111)
Creatinine, Ser: 1.22 mg/dL — ABNORMAL HIGH (ref 0.44–1.00)
GFR calc Af Amer: 52 mL/min — ABNORMAL LOW (ref >60)
GFR, EST NON AFRICAN AMERICAN: 44 mL/min — AB (ref >60)
GLUCOSE: 103 mg/dL — AB (ref 70–99)
Potassium: 4.3 mmol/L (ref 3.5–5.1)
Sodium: 142 mmol/L (ref 135–145)

## 2017-10-19 LAB — CBC
HEMATOCRIT: 32.9 % — AB (ref 35.0–47.0)
Hemoglobin: 11.2 g/dL — ABNORMAL LOW (ref 12.0–16.0)
MCH: 32.1 pg (ref 26.0–34.0)
MCHC: 34.2 g/dL (ref 32.0–36.0)
MCV: 94.1 fL (ref 80.0–100.0)
Platelets: 287 10*3/uL (ref 150–440)
RBC: 3.49 MIL/uL — ABNORMAL LOW (ref 3.80–5.20)
RDW: 15.1 % — AB (ref 11.5–14.5)
WBC: 11 10*3/uL (ref 3.6–11.0)

## 2017-10-19 MED ORDER — ONDANSETRON HCL 4 MG PO TABS: 4.0000 mg | ORAL_TABLET | Freq: Four times a day (QID) | ORAL | 0 refills | Status: DC | PRN

## 2017-10-19 MED ORDER — HYDROMORPHONE HCL 2 MG PO TABS: 2.0000 mg | ORAL_TABLET | Freq: Two times a day (BID) | ORAL | 0 refills | Status: DC | PRN

## 2017-10-19 MED ORDER — OXYCODONE HCL ER 10 MG PO T12A: 10.0000 mg | EXTENDED_RELEASE_TABLET | Freq: Two times a day (BID) | ORAL | 0 refills | Status: DC

## 2017-10-19 MED ORDER — DOCUSATE SODIUM 100 MG PO CAPS: 200.0000 mg | ORAL_CAPSULE | Freq: Two times a day (BID) | ORAL | 0 refills | Status: DC

## 2017-10-19 MED ORDER — HYDROCODONE-ACETAMINOPHEN 7.5-325 MG PO TABS: 1.0000 | ORAL_TABLET | Freq: Four times a day (QID) | ORAL | 0 refills | Status: DC | PRN

## 2017-10-19 MED ORDER — DOCUSATE SODIUM 100 MG PO CAPS
200.0000 mg | ORAL_CAPSULE | Freq: Two times a day (BID) | ORAL | Status: DC
  Administered 2017-10-19: 200 mg via ORAL
  Filled 2017-10-19: qty 2

## 2017-10-19 NOTE — Progress Notes (Signed)
MD paged at patient request.

## 2017-10-19 NOTE — Progress Notes (Signed)
Patient is discharged home on self. Discharge instruction given to patient. All questions answered and concerns addressed. Patient verbalized understanding.

## 2017-10-19 NOTE — Consult Note (Signed)
[place]  Telephone:([phone]) [phone] Fax:([phone]) [phone]  ID: [name] OB: 03/01/1950  MR#: [id number]  CBJ#:[id number]  Patient Care Team: Glendon Axe, MD as PCP - General (Internal Medicine) Clent Jacks, RN as Registered Nurse  CHIEF COMPLAINT: Pancreatic mass, intractable abdominal pain.  INTERVAL HISTORY: Patient is a 67 year old female who presented to the emergency room earlier this week with intractable abdominal pain that it started intermittently in April 2019.  Subsequent CT scan revealed a pancreatic mass highly suspicious for underlying malignancy.  She returned to the emergency room and subsequently admitted for worsening pain.  Currently, she continues to have pain but is significantly better controlled on her current narcotic regimen.  She otherwise feels well.  She has no neurologic complaints.  She denies any recent fevers or illnesses.  She has a good appetite and denies weight loss.  She denies any chest pain or shortness of breath.  She has no nausea, vomiting, constipation, or diarrhea.  She has no urinary complaints.  Patient otherwise feels well and offers no further specific complaints.  REVIEW OF SYSTEMS:   Review of Systems  Constitutional: Negative.  Negative for fever, malaise/fatigue and weight loss.  Respiratory: Negative.  Negative for cough and shortness of breath.   Cardiovascular: Negative.  Negative for chest pain and leg swelling.  Gastrointestinal: Positive for abdominal pain. Negative for blood in stool, constipation, diarrhea and melena.  Genitourinary: Negative.  Negative for dysuria.  Musculoskeletal: Negative.  Negative for back pain.  Skin: Negative.  Negative for rash.  Neurological: Negative.  Negative for dizziness, focal weakness, weakness and headaches.  Psychiatric/Behavioral: Negative.  The patient is not nervous/anxious.     As per HPI. Otherwise, a complete review of systems is negative.  PAST MEDICAL  HISTORY: Past Medical History:  Diagnosis Date  . Essential hypertension     PAST SURGICAL HISTORY: History reviewed. No pertinent surgical history.  FAMILY HISTORY: Family History  Problem Relation Age of Onset  . Hypertension Mother   . Heart attack Father     ADVANCED DIRECTIVES (Y/N):  @ADVDIR @  HEALTH MAINTENANCE: Social History   Tobacco Use  . Smoking status: Never Smoker  . Smokeless tobacco: Never Used  Substance Use Topics  . Alcohol use: Not Currently  . Drug use: Never     Colonoscopy:  PAP:  Bone density:  Lipid panel:  Allergies  Allergen Reactions  . Nsaids Other (See Comments)    Decreased GFR  . Ibuprofen     Lowers kidney function  . Penicillins     Yeast infection  Has patient had a PCN reaction causing immediate rash, facial/tongue/throat swelling, SOB or lightheadedness with hypotension: No Has patient had a PCN reaction causing severe rash involving mucus membranes or skin necrosis: No Has patient had a PCN reaction that required hospitalization: No Has patient had a PCN reaction occurring within the last 10 years: No If all of the above answers are "NO", then may proceed with Cephalosporin use.     Current Facility-Administered Medications  Medication Dose Route Frequency Provider Last Rate Last Dose  . acetaminophen (TYLENOL) tablet 650 mg  650 mg Oral Q6H PRN Henreitta Leber, MD       Or  . acetaminophen (TYLENOL) suppository 650 mg  650 mg Rectal Q6H PRN Henreitta Leber, MD      . docusate sodium (COLACE) capsule 200 mg  200 mg Oral BID Dustin Flock, MD   200 mg at 10/19/17 1432  .  enoxaparin (LOVENOX) injection 40 mg  40 mg Subcutaneous Q24H Henreitta Leber, MD   40 mg at 10/18/17 2030  . losartan (COZAAR) tablet 50 mg  50 mg Oral Daily Henreitta Leber, MD   50 mg at 10/19/17 0913   And  . hydrochlorothiazide (MICROZIDE) capsule 12.5 mg  12.5 mg Oral Daily Henreitta Leber, MD   12.5 mg at 10/19/17 0913  .  HYDROcodone-acetaminophen (NORCO) 7.5-325 MG per tablet 1 tablet  1 tablet Oral Q6H PRN Henreitta Leber, MD   1 tablet at 10/18/17 2030  . HYDROmorphone (DILAUDID) injection 1 mg  1 mg Intravenous Q3H PRN Henreitta Leber, MD   1 mg at 10/19/17 1416  . ondansetron (ZOFRAN) tablet 4 mg  4 mg Oral Q6H PRN Henreitta Leber, MD       Or  . ondansetron (ZOFRAN) injection 4 mg  4 mg Intravenous Q6H PRN Henreitta Leber, MD   4 mg at 10/19/17 0037  . oxyCODONE (OXYCONTIN) 12 hr tablet 10 mg  10 mg Oral Q12H Henreitta Leber, MD   10 mg at 10/19/17 8413   Current Outpatient Medications  Medication Sig Dispense Refill  . cephALEXin (KEFLEX) 250 MG capsule Take 1 capsule (250 mg total) by mouth 4 (four) times daily for 10 days. 40 capsule 0  . losartan-hydrochlorothiazide (HYZAAR) 50-12.5 MG tablet Take 1 tablet by mouth daily.  1  . ondansetron (ZOFRAN ODT) 4 MG disintegrating tablet Take 1 tablet (4 mg total) by mouth every 8 (eight) hours as needed for nausea or vomiting. 20 tablet 0  . senna (SENOKOT) 8.6 MG TABS tablet Take 1 tablet (8.6 mg total) by mouth at bedtime. 120 each 0  . docusate sodium (COLACE) 100 MG capsule Take 2 capsules (200 mg total) by mouth 2 (two) times daily. 30 capsule 0  . HYDROmorphone (DILAUDID) 2 MG tablet Take 1 tablet (2 mg total) by mouth every 12 (twelve) hours as needed for severe pain. 20 tablet 0  . ondansetron (ZOFRAN) 4 MG tablet Take 1 tablet (4 mg total) by mouth every 6 (six) hours as needed for nausea. 20 tablet 0  . oxyCODONE (OXYCONTIN) 10 mg 12 hr tablet Take 1 tablet (10 mg total) by mouth every 12 (twelve) hours. 14 tablet 0    OBJECTIVE: Vitals:   10/19/17 0910 10/19/17 1421  BP: (!) 131/54 (!) 122/56  Pulse: 77 74  Resp:  18  Temp: 98.7 F (37.1 C) 98.3 F (36.8 C)  SpO2: 98% 99%     Body mass index is 31.35 kg/m.    ECOG FS:1 - Symptomatic but completely ambulatory  General: Well-developed, well-nourished, no acute distress. Eyes: Pink  conjunctiva, anicteric sclera. HEENT: Normocephalic, moist mucous membranes, clear oropharnyx. Lungs: Clear to auscultation bilaterally. Heart: Regular rate and rhythm. No rubs, murmurs, or gallops. Abdomen: Soft, nontender, nondistended. No organomegaly noted, normoactive bowel sounds. Musculoskeletal: No edema, cyanosis, or clubbing. Neuro: Alert, answering all questions appropriately. Cranial nerves grossly intact. Skin: No rashes or petechiae noted. Psych: Normal affect. Lymphatics: No cervical, calvicular, axillary or inguinal LAD.   LAB RESULTS:  Lab Results  Component Value Date   NA 142 10/19/2017   K 4.3 10/19/2017   CL 108 10/19/2017   CO2 26 10/19/2017   GLUCOSE 103 (H) 10/19/2017   BUN 17 10/19/2017   CREATININE 1.22 (H) 10/19/2017   CALCIUM 8.7 (L) 10/19/2017   PROT 8.3 (H) 10/18/2017   ALBUMIN 4.1 10/18/2017  AST 30 10/18/2017   ALT 20 10/18/2017   ALKPHOS 115 10/18/2017   BILITOT 0.8 10/18/2017   GFRNONAA 44 (L) 10/19/2017   GFRAA 52 (L) 10/19/2017    Lab Results  Component Value Date   WBC 11.0 10/19/2017   NEUTROABS 9.7 (H) 10/18/2017   HGB 11.2 (L) 10/19/2017   HCT 32.9 (L) 10/19/2017   MCV 94.1 10/19/2017   PLT 287 10/19/2017     STUDIES: Ct Abdomen Pelvis W Contrast  Result Date: 10/17/2017 CLINICAL DATA:  Pancreatic lesion on earlier noncontrast CT, for further assessment. EXAM: CT ABDOMEN AND PELVIS WITH CONTRAST TECHNIQUE: Multidetector CT imaging of the abdomen and pelvis was performed using the standard protocol following bolus administration of intravenous contrast. CONTRAST:  134mL ISOVUE-300 IOPAMIDOL (ISOVUE-300) INJECTION 61% COMPARISON:  10/17/2017 FINDINGS: Lower chest: Unremarkable Hepatobiliary: Diffuse hepatic steatosis. Cholecystectomy. No appreciable focal hepatic mass. Pancreas: There is a hypodense mass in the uncinate process of the pancreas suspicious for pancreatic adenocarcinoma. Size: 3.0 by 2.8 by 2.4 cm Location: Uncinate  Characterization: Solid Enhancement: Hypoenhancing Other Characteristics: No calcification. Mildly ill-defined margins. Local extent of mass: No definite duodenal invasion. Vascular Involvement: The mass abuts the posterior margin of the superior mesenteric artery and also abuts the posterior margin of the superior mesenteric vein. No overt encasement of either of the structures. The posterior margin of the mass slightly abuts the left renal vein. Variant hepatic artery anatomy: Conventional Bile Duct Involvement: Absent Variant biliary anatomy: Not appreciated Adjacent Nodes: Portacaval node 0.8 cm in short axis on image 30/4, and somewhat indistinctly marginated. Peripancreatic node 0.6 cm in short axis on image 28/4. Omental/Peritoneal Disease: Absent Distant Metastases: Absent Spleen: Unremarkable Adrenals/Urinary Tract: 2.6 by 2.8 cm enhancing exophytic mass of the left mid kidney laterally within anterior cystic component but primarily solid, most compatible with a renal cell carcinoma. The adrenal glands appear normal. Suspected urethral diverticulum or periurethral cyst somewhat eccentric to the right. Small bladder cellule or diverticulum on the left. Stomach/Bowel: Wall thickening in the stomach antrum is most likely physiologic or due to contraction. Fatty prominence of the ileocecal valve. Descending and sigmoid colon diverticulosis. Upper normal diameter of the appendix at 7 mm. Vascular/Lymphatic: No tumor thrombus in the left renal vein. The pancreatic mass abuts the SMA, SMV, and left renal vein as noted above in the pancreas section. Small peripancreatic/porta hepatis lymph nodes as noted above. No overt pathologic periaortic adenopathy at this time. Reproductive: Unremarkable Other: Trace free pelvic fluid. Musculoskeletal: Umbilical hernia contains adipose tissue. IMPRESSION: 1. Hypoenhancing 3.0 cm mass of the uncinate process of the pancreas. The lesion abuts the SMV, SMA, and left renal vein  without true encasement at this time. Appearance suspicious for pancreatic adenocarcinoma, focal pancreatitis considered a less likely differential diagnostic consideration. Metastatic disease from the left renal mass is likewise considered less likely given the difference in overall characteristics between the pancreatic and renal lesions. Further characteristics of the pancreatic tumor are enumerated above. 2. There is also a 2.8 cm enhancing exophytic mass of the left mid kidney compatible with renal cell carcinoma. No tumor thrombus in the left renal vein or adjacent periaortic adenopathy. 3. No evidence of hepatic or other metastatic lesions. 4. Other imaging findings of potential clinical significance: Diffuse hepatic steatosis. Descending and sigmoid colon diverticulosis. Trace free pelvic fluid. Umbilical hernia contains adipose tissue. Electronically Signed   By: Van Clines M.D.   On: 10/17/2017 10:27   Ct Renal Stone Study  Result Date: 10/17/2017  CLINICAL DATA:  Mid to lower back pain on the left for a few days EXAM: CT ABDOMEN AND PELVIS WITHOUT CONTRAST TECHNIQUE: Multidetector CT imaging of the abdomen and pelvis was performed following the standard protocol without IV contrast. COMPARISON:  None. FINDINGS: Lower chest: Ground-glass density in the right lower lobe is attributed to scarring from bulky osteophytes. Hepatobiliary: Hepatic steatosis.Cholecystectomy with normal common bile duct diameter. Pancreas: Abnormal fullness at the level of the uncinate process with haziness of surrounding fat. Soft tissue indistinguishable from the proximal SMA. No ductal dilatation or evident collection. Spleen: Unremarkable. Adrenals/Urinary Tract: Negative adrenals. No hydronephrosis or stone. 2.7 cm left renal lesion measuring cystic density. Unremarkable bladder. There is a C-shaped low-density right of the urethra measuring 19 mm in length by 8 mm in thickness, most likely a urethral diverticulum.  Stomach/Bowel: Extensive colonic diverticulosis distally. No obstruction . No appendicitis Vascular/Lymphatic: No acute vascular abnormality. No mass or adenopathy. Reproductive:No pathologic findings. Other: No ascites or pneumoperitoneum.  Fatty umbilical hernia. Musculoskeletal: No acute abnormalities. Spondylosis and disc degeneration. These results were called by telephone at the time of interpretation on 10/17/2017 at 8:55 am to Dr. Lenise Arena , who verbally acknowledged these results. IMPRESSION: 1. Mass versus focal pancreatitis at the uncinate process, appearance more concerning for carcinoma than inflammation. Please correlate with serum enzymes and obtained postcontrast pancreas protocol CT. 2. Urethral diverticulum. 3. Hepatic steatosis. 4. Colonic diverticulosis. 5. Fatty supra umbilical hernia. Electronically Signed   By: Monte Fantasia M.D.   On: 10/17/2017 08:58    ASSESSMENT: Pancreatic mass, intractable abdominal pain.  PLAN:    1.  Pancreatic mass: Highly suspicious for underlying malignancy.  Patient CA-19-9 is mildly elevated at 78.  Patient has follow-up in the cancer center scheduled for Wednesday, October 23, 2017.  She has biopsy scheduled by EUS on Thursday, October 24, 2017.  Ultimately, patient will require a PET scan to complete the staging work-up.  No further intervention is needed.  Okay to discharge from an oncology standpoint. 2.  Intractable abdominal pain: Possibly secondary to pancreatic mass: Agree with current narcotics. 3.  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  Continue to monitor while pancreatic mass is being worked up.  No intervention is needed.  Appreciate consult, call with questions.  Lloyd Huger, MD   10/19/2017 3:26 PM

## 2017-10-19 NOTE — Telephone Encounter (Signed)
Voicemail left with [name] to return call. EUS for pancreatic biopsy has been scheduled for 10/25/17 with Dr. Francella Solian at [facility]. We need to go over instructions and education for biopsy.  Oncology Nurse Navigator Documentation  Navigator Location: [facility] (10/19/17 1500)   )Navigator Encounter Type: Telephone (10/19/17 1500) Telephone: Cindy Bowman Call;Appt Confirmation/Clarification (10/19/17 1500)                       Barriers/Navigation Needs: Coordination of Care (10/19/17 1500)   Interventions: Coordination of Care (10/19/17 1500)   Coordination of Care: EUS (10/19/17 1500)                  Time Spent with Patient: 15 (10/19/17 1500)

## 2017-10-19 NOTE — H&P (View-Only) (Signed)
Cindy Bowman  [phone] [phone]  ID: Cindy Bowman OB: Dec 08, 1950  MR#: [id number]  WEX#:[id number]  Patient Care Team: Cindy Axe, MD as PCP - General (Internal Medicine) Cindy Jacks, RN as Registered Nurse  CHIEF COMPLAINT: Pancreatic mass, intractable abdominal pain.  INTERVAL HISTORY: Patient is a 67 year old female who presented to the emergency room earlier this week with intractable abdominal pain that it started intermittently in April 2019.  Subsequent CT scan revealed a pancreatic mass highly suspicious for underlying malignancy.  She returned to the emergency room and subsequently admitted for worsening pain.  Currently, she continues to have pain but is significantly better controlled on her current narcotic regimen.  She otherwise feels well.  She has no neurologic complaints.  She denies any recent fevers or illnesses.  She has a good appetite and denies weight loss.  She denies any chest pain or shortness of breath.  She has no nausea, vomiting, constipation, or diarrhea.  She has no urinary complaints.  Patient otherwise feels well and offers no further specific complaints.  REVIEW OF SYSTEMS:   Review of Systems  Constitutional: Negative.  Negative for fever, malaise/fatigue and weight loss.  Respiratory: Negative.  Negative for cough and shortness of breath.   Cardiovascular: Negative.  Negative for chest pain and leg swelling.  Gastrointestinal: Positive for abdominal pain. Negative for blood in stool, constipation, diarrhea and melena.  Genitourinary: Negative.  Negative for dysuria.  Musculoskeletal: Negative.  Negative for back pain.  Skin: Negative.  Negative for rash.  Neurological: Negative.  Negative for dizziness, focal weakness, weakness and headaches.  Psychiatric/Behavioral: Negative.  The patient is not nervous/anxious.     As per HPI. Otherwise, a complete review of systems is negative.  PAST MEDICAL  HISTORY: Past Medical History:  Diagnosis Date  . Essential hypertension     PAST SURGICAL HISTORY: History reviewed. No pertinent surgical history.  FAMILY HISTORY: Family History  Problem Relation Age of Onset  . Hypertension Mother   . Heart attack Father     ADVANCED DIRECTIVES (Y/N):  @ADVDIR @  HEALTH MAINTENANCE: Social History   Tobacco Use  . Smoking status: Never Smoker  . Smokeless tobacco: Never Used  Substance Use Topics  . Alcohol use: Not Currently  . Drug use: Never     Colonoscopy:  PAP:  Bone density:  Lipid panel:  Allergies  Allergen Reactions  . Nsaids Other (See Comments)    Decreased GFR  . Ibuprofen     Lowers kidney function  . Penicillins     Yeast infection  Has patient had a PCN reaction causing immediate rash, facial/tongue/throat swelling, SOB or lightheadedness with hypotension: No Has patient had a PCN reaction causing severe rash involving mucus membranes or skin necrosis: No Has patient had a PCN reaction that required hospitalization: No Has patient had a PCN reaction occurring within the last 10 years: No If all of the above answers are "NO", then may proceed with Cephalosporin use.     Current Facility-Administered Medications  Medication Dose Route Frequency Provider Last Rate Last Dose  . acetaminophen (TYLENOL) tablet 650 mg  650 mg Oral Q6H PRN Cindy Leber, MD       Or  . acetaminophen (TYLENOL) suppository 650 mg  650 mg Rectal Q6H PRN Cindy Leber, MD      . docusate sodium (COLACE) capsule 200 mg  200 mg Oral BID Cindy Flock, MD   200 mg at 10/19/17 1432  .  enoxaparin (LOVENOX) injection 40 mg  40 mg Subcutaneous Q24H Cindy Leber, MD   40 mg at 10/18/17 2030  . losartan (COZAAR) tablet 50 mg  50 mg Oral Daily Cindy Leber, MD   50 mg at 10/19/17 0913   And  . hydrochlorothiazide (MICROZIDE) capsule 12.5 mg  12.5 mg Oral Daily Cindy Leber, MD   12.5 mg at 10/19/17 0913  .  HYDROcodone-acetaminophen (NORCO) 7.5-325 MG per tablet 1 tablet  1 tablet Oral Q6H PRN Cindy Leber, MD   1 tablet at 10/18/17 2030  . HYDROmorphone (DILAUDID) injection 1 mg  1 mg Intravenous Q3H PRN Cindy Leber, MD   1 mg at 10/19/17 1416  . ondansetron (ZOFRAN) tablet 4 mg  4 mg Oral Q6H PRN Cindy Leber, MD       Or  . ondansetron (ZOFRAN) injection 4 mg  4 mg Intravenous Q6H PRN Cindy Leber, MD   4 mg at 10/19/17 0037  . oxyCODONE (OXYCONTIN) 12 hr tablet 10 mg  10 mg Oral Q12H Cindy Leber, MD   10 mg at 10/19/17 6503   Current Outpatient Medications  Medication Sig Dispense Refill  . cephALEXin (KEFLEX) 250 MG capsule Take 1 capsule (250 mg total) by mouth 4 (four) times daily for 10 days. 40 capsule 0  . losartan-hydrochlorothiazide (HYZAAR) 50-12.5 MG tablet Take 1 tablet by mouth daily.  1  . ondansetron (ZOFRAN ODT) 4 MG disintegrating tablet Take 1 tablet (4 mg total) by mouth every 8 (eight) hours as needed for nausea or vomiting. 20 tablet 0  . senna (SENOKOT) 8.6 MG TABS tablet Take 1 tablet (8.6 mg total) by mouth at bedtime. 120 each 0  . docusate sodium (COLACE) 100 MG capsule Take 2 capsules (200 mg total) by mouth 2 (two) times daily. 30 capsule 0  . HYDROmorphone (DILAUDID) 2 MG tablet Take 1 tablet (2 mg total) by mouth every 12 (twelve) hours as needed for severe pain. 20 tablet 0  . ondansetron (ZOFRAN) 4 MG tablet Take 1 tablet (4 mg total) by mouth every 6 (six) hours as needed for nausea. 20 tablet 0  . oxyCODONE (OXYCONTIN) 10 mg 12 hr tablet Take 1 tablet (10 mg total) by mouth every 12 (twelve) hours. 14 tablet 0    OBJECTIVE: Vitals:   10/19/17 0910 10/19/17 1421  BP: (!) 131/54 (!) 122/56  Pulse: 77 74  Resp:  18  Temp: 98.7 F (37.1 C) 98.3 F (36.8 C)  SpO2: 98% 99%     Body mass index is 31.35 kg/m.    ECOG FS:1 - Symptomatic but completely ambulatory  General: Well-developed, well-nourished, no acute distress. Eyes: Pink  conjunctiva, anicteric sclera. HEENT: Normocephalic, moist mucous membranes, clear oropharnyx. Lungs: Clear to auscultation bilaterally. Heart: Regular rate and rhythm. No rubs, murmurs, or gallops. Abdomen: Soft, nontender, nondistended. No organomegaly noted, normoactive bowel sounds. Musculoskeletal: No edema, cyanosis, or clubbing. Neuro: Alert, answering all questions appropriately. Cranial nerves grossly intact. Skin: No rashes or petechiae noted. Psych: Normal affect. Lymphatics: No cervical, calvicular, axillary or inguinal LAD.   LAB RESULTS:  Lab Results  Component Value Date   NA 142 10/19/2017   K 4.3 10/19/2017   CL 108 10/19/2017   CO2 26 10/19/2017   GLUCOSE 103 (H) 10/19/2017   BUN 17 10/19/2017   CREATININE 1.22 (H) 10/19/2017   CALCIUM 8.7 (L) 10/19/2017   PROT 8.3 (H) 10/18/2017   ALBUMIN 4.1 10/18/2017  AST 30 10/18/2017   ALT 20 10/18/2017   ALKPHOS 115 10/18/2017   BILITOT 0.8 10/18/2017   GFRNONAA 44 (L) 10/19/2017   GFRAA 52 (L) 10/19/2017    Lab Results  Component Value Date   WBC 11.0 10/19/2017   NEUTROABS 9.7 (H) 10/18/2017   HGB 11.2 (L) 10/19/2017   HCT 32.9 (L) 10/19/2017   MCV 94.1 10/19/2017   PLT 287 10/19/2017     STUDIES: Ct Abdomen Pelvis W Contrast  Result Date: 10/17/2017 CLINICAL DATA:  Pancreatic lesion on earlier noncontrast CT, for further assessment. EXAM: CT ABDOMEN AND PELVIS WITH CONTRAST TECHNIQUE: Multidetector CT imaging of the abdomen and pelvis was performed using the standard protocol following bolus administration of intravenous contrast. CONTRAST:  165mL ISOVUE-300 IOPAMIDOL (ISOVUE-300) INJECTION 61% COMPARISON:  10/17/2017 FINDINGS: Lower chest: Unremarkable Hepatobiliary: Diffuse hepatic steatosis. Cholecystectomy. No appreciable focal hepatic mass. Pancreas: There is a hypodense mass in the uncinate process of the pancreas suspicious for pancreatic adenocarcinoma. Size: 3.0 by 2.8 by 2.4 cm Location: Uncinate  Characterization: Solid Enhancement: Hypoenhancing Other Characteristics: No calcification. Mildly ill-defined margins. Local extent of mass: No definite duodenal invasion. Vascular Involvement: The mass abuts the posterior margin of the superior mesenteric artery and also abuts the posterior margin of the superior mesenteric vein. No overt encasement of either of the structures. The posterior margin of the mass slightly abuts the left renal vein. Variant hepatic artery anatomy: Conventional Bile Duct Involvement: Absent Variant biliary anatomy: Not appreciated Adjacent Nodes: Portacaval node 0.8 cm in short axis on image 30/4, and somewhat indistinctly marginated. Peripancreatic node 0.6 cm in short axis on image 28/4. Omental/Peritoneal Disease: Absent Distant Metastases: Absent Spleen: Unremarkable Adrenals/Urinary Tract: 2.6 by 2.8 cm enhancing exophytic mass of the left mid kidney laterally within anterior cystic component but primarily solid, most compatible with a renal cell carcinoma. The adrenal glands appear normal. Suspected urethral diverticulum or periurethral cyst somewhat eccentric to the right. Small bladder cellule or diverticulum on the left. Stomach/Bowel: Wall thickening in the stomach antrum is most likely physiologic or due to contraction. Fatty prominence of the ileocecal valve. Descending and sigmoid colon diverticulosis. Upper normal diameter of the appendix at 7 mm. Vascular/Lymphatic: No tumor thrombus in the left renal vein. The pancreatic mass abuts the SMA, SMV, and left renal vein as noted above in the pancreas section. Small peripancreatic/porta hepatis lymph nodes as noted above. No overt pathologic periaortic adenopathy at this time. Reproductive: Unremarkable Other: Trace free pelvic fluid. Musculoskeletal: Umbilical hernia contains adipose tissue. IMPRESSION: 1. Hypoenhancing 3.0 cm mass of the uncinate process of the pancreas. The lesion abuts the SMV, SMA, and left renal vein  without true encasement at this time. Appearance suspicious for pancreatic adenocarcinoma, focal pancreatitis considered a less likely differential diagnostic consideration. Metastatic disease from the left renal mass is likewise considered less likely given the difference in overall characteristics between the pancreatic and renal lesions. Further characteristics of the pancreatic tumor are enumerated above. 2. There is also a 2.8 cm enhancing exophytic mass of the left mid kidney compatible with renal cell carcinoma. No tumor thrombus in the left renal vein or adjacent periaortic adenopathy. 3. No evidence of hepatic or other metastatic lesions. 4. Other imaging findings of potential clinical significance: Diffuse hepatic steatosis. Descending and sigmoid colon diverticulosis. Trace free pelvic fluid. Umbilical hernia contains adipose tissue. Electronically Signed   By: Van Clines M.D.   On: 10/17/2017 10:27   Ct Renal Stone Study  Result Date: 10/17/2017  CLINICAL DATA:  Mid to lower back pain on the left for a few days EXAM: CT ABDOMEN AND PELVIS WITHOUT CONTRAST TECHNIQUE: Multidetector CT imaging of the abdomen and pelvis was performed following the standard protocol without IV contrast. COMPARISON:  None. FINDINGS: Lower chest: Ground-glass density in the right lower lobe is attributed to scarring from bulky osteophytes. Hepatobiliary: Hepatic steatosis.Cholecystectomy with normal common bile duct diameter. Pancreas: Abnormal fullness at the level of the uncinate process with haziness of surrounding fat. Soft tissue indistinguishable from the proximal SMA. No ductal dilatation or evident collection. Spleen: Unremarkable. Adrenals/Urinary Tract: Negative adrenals. No hydronephrosis or stone. 2.7 cm left renal lesion measuring cystic density. Unremarkable bladder. There is a C-shaped low-density right of the urethra measuring 19 mm in length by 8 mm in thickness, most likely a urethral diverticulum.  Stomach/Bowel: Extensive colonic diverticulosis distally. No obstruction . No appendicitis Vascular/Lymphatic: No acute vascular abnormality. No mass or adenopathy. Reproductive:No pathologic findings. Other: No ascites or pneumoperitoneum.  Fatty umbilical hernia. Musculoskeletal: No acute abnormalities. Spondylosis and disc degeneration. These results were called by telephone at the time of interpretation on 10/17/2017 at 8:55 am to Dr. Lenise Arena , who verbally acknowledged these results. IMPRESSION: 1. Mass versus focal pancreatitis at the uncinate process, appearance more concerning for carcinoma than inflammation. Please correlate with serum enzymes and obtained postcontrast pancreas protocol CT. 2. Urethral diverticulum. 3. Hepatic steatosis. 4. Colonic diverticulosis. 5. Fatty supra umbilical hernia. Electronically Signed   By: Monte Fantasia M.D.   On: 10/17/2017 08:58    ASSESSMENT: Pancreatic mass, intractable abdominal pain.  PLAN:    1.  Pancreatic mass: Highly suspicious for underlying malignancy.  Patient CA-19-9 is mildly elevated at 78.  Patient has follow-up in the cancer center scheduled for Wednesday, October 23, 2017.  She has biopsy scheduled by [facility] on Thursday, October 24, 2017.  Ultimately, patient will require a PET scan to complete the staging work-up.  No further intervention is needed.  Okay to discharge from an oncology standpoint. 2.  Intractable abdominal pain: Possibly secondary to pancreatic mass: Agree with current narcotics. 3.  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  Continue to monitor while pancreatic mass is being worked up.  No intervention is needed.  Appreciate consult, call with questions.  Lloyd Huger, MD   10/19/2017 3:26 PM

## 2017-10-19 NOTE — Plan of Care (Signed)
  Problem: Spiritual Needs Goal: Ability to function at adequate level Outcome: Adequate for Discharge   Problem: Education: Goal: Knowledge of General Education information will improve Description Including pain rating scale, medication(s)/side effects and non-pharmacologic comfort measures 10/19/2017 1358 by Linton Flemings, RN Outcome: Adequate for Discharge 10/19/2017 1144 by Linton Flemings, RN Outcome: Progressing   Problem: Health Behavior/Discharge Planning: Goal: Ability to manage health-related needs will improve 10/19/2017 1358 by Linton Flemings, RN Outcome: Adequate for Discharge 10/19/2017 1144 by Linton Flemings, RN Outcome: Progressing   Problem: Clinical Measurements: Goal: Ability to maintain clinical measurements within normal limits will improve 10/19/2017 1358 by Linton Flemings, RN Outcome: Adequate for Discharge 10/19/2017 1144 by Linton Flemings, RN Outcome: Progressing Goal: Will remain free from infection Outcome: Adequate for Discharge Goal: Diagnostic test results will improve Outcome: Adequate for Discharge Goal: Respiratory complications will improve 10/19/2017 1358 by Linton Flemings, RN Outcome: Adequate for Discharge 10/19/2017 1144 by Linton Flemings, RN Outcome: Progressing Goal: Cardiovascular complication will be avoided Outcome: Adequate for Discharge   Problem: Activity: Goal: Risk for activity intolerance will decrease 10/19/2017 1358 by Linton Flemings, RN Outcome: Adequate for Discharge 10/19/2017 1144 by Linton Flemings, RN Outcome: Progressing   Problem: Nutrition: Goal: Adequate nutrition will be maintained Outcome: Adequate for Discharge   Problem: Coping: Goal: Level of anxiety will decrease Outcome: Adequate for Discharge   Problem: Elimination: Goal: Will not experience complications related to bowel motility Outcome: Adequate for Discharge Goal: Will not experience complications related to urinary retention Outcome:  Adequate for Discharge   Problem: Pain Managment: Goal: General experience of comfort will improve Outcome: Adequate for Discharge   Problem: Safety: Goal: Ability to remain free from injury will improve Outcome: Adequate for Discharge   Problem: Skin Integrity: Goal: Risk for impaired skin integrity will decrease Outcome: Adequate for Discharge

## 2017-10-19 NOTE — Progress Notes (Signed)
Initial Nutrition Assessment  DOCUMENTATION CODES:   Obesity unspecified  INTERVENTION:   -Snacks TID -MVI with minerals daily  NUTRITION DIAGNOSIS:   Inadequate oral intake related to decreased appetite as evidenced by per patient/family report, percent weight loss.  GOAL:   Patient will meet greater than or equal to 90% of their needs  MONITOR:   PO intake, Supplement acceptance, Labs, Weight trends, Skin, I & O's  REASON FOR ASSESSMENT:   Malnutrition Screening Tool    ASSESSMENT:   67 year old female with past medical history of essential hypertension who presents to the hospital complaining of back pain/abdominal pain and was noted to have a pancreatic and left renal mass.  Pt admitted with pancreatic/ lt renal mass and back pain.   Spoke with pt and son at bedside. Pt reports ongoing poor appetite and weight loss over the past 4-5 months, which she attributes to ongoing abdominal pain. She estimates she has been consuming 25-50% less than what she usually eats. She typically consumes 2 meals per day, which consists of a meat, starch, and vegetable; soup; or sandwich. Pt reports she only consumed about 50% of breakfast this morning.   Pt reports she has lost about 10 pounds over the past 4 months. Reviewed chart from [facility], which revealed a wt of 85.6 kg on 07/24/17 (which reveals a 6.1% wt loss over the past 3 months, which while not significant for time frame, is concerning given decreased appetite).   Discussed importance of good meal and supplement intake to promote healing. Offered supplements, but pt refused, as she does not tolerate well. Pt amenable to snacks.   Per MD notes, plan to discharge pt home today after being seen by oncology.   Labs reviewed.   NUTRITION - FOCUSED PHYSICAL EXAM:    Most Recent Value  Orbital Region  No depletion  Upper Arm Region  No depletion  Thoracic and Lumbar Region  No depletion  Buccal Region  No depletion   Temple Region  No depletion  Clavicle Bone Region  No depletion  Clavicle and Acromion Bone Region  No depletion  Scapular Bone Region  No depletion  Dorsal Hand  No depletion  Patellar Region  No depletion  Anterior Thigh Region  No depletion  Posterior Calf Region  No depletion  Edema (RD Assessment)  None  Hair  Reviewed  Eyes  Reviewed  Mouth  Reviewed  Skin  Reviewed  Nails  Reviewed       Diet Order:   Diet Order            Diet Heart Room service appropriate? Yes; Fluid consistency: Thin  Diet effective now              EDUCATION NEEDS:   Education needs have been addressed  Skin:  Skin Assessment: Reviewed RN Assessment  Last BM:  10/18/17  Height:   Ht Readings from Last 1 Encounters:  10/18/17 5\' 3"  (1.6 m)    Weight:   Wt Readings from Last 1 Encounters:  10/18/17 80.3 kg    Ideal Body Weight:  52.3 kg  BMI:  Body mass index is 31.35 kg/m.  Estimated Nutritional Needs:   Kcal:  1650-1850  Protein:  80-95 grams  Fluid:  1.6-1.8 L    Mekenzie Modeste A. Jimmye Norman, RD, LDN, CDE Pager: [phone] After hours Pager: [phone]

## 2017-10-19 NOTE — Plan of Care (Signed)

## 2017-10-19 NOTE — Discharge Summary (Signed)
[name] at [facility], 67 y.o., DOB Oct 06, 1950, MRN [id number]. Admission date: 10/18/2017 Discharge Date 10/19/2017 Primary MD Glendon Axe, MD Admitting Physician Henreitta Leber, MD  Admission Diagnosis  Kidney mass [N28.89] Pain [R52] Pancreatic mass [K86.9]  Discharge Diagnosis   Active Problems: Back pain due to pancreatic mass Pancreatic mass likely due to pancreatic cancer with elevated CA-19-9 Left renal mass outpatient urology follow-up Essential hypertension   Hospital Course patient 67 year old female with history of hypertension who is been having trouble with her back and abdomen ongoing for the past few months.  Has been seen at another institutionion and was thought to have gallbladder issues and has had her gallbladder resected presented to our hospital with back pain underwent evaluation with a CT of the abdomen which showed a pancreatic mass as well as a renal mass.  Patient will be seen by oncology and outpatient follow-up will be set up.  She is also started on pain medications that has seems to have helped her symptoms.            Consults  oncology  Significant Tests:  See full reports for all details    Ct Abdomen Pelvis W Contrast  Result Date: 10/17/2017 CLINICAL DATA:  Pancreatic lesion on earlier noncontrast CT, for further assessment. EXAM: CT ABDOMEN AND PELVIS WITH CONTRAST TECHNIQUE: Multidetector CT imaging of the abdomen and pelvis was performed using the standard protocol following bolus administration of intravenous contrast. CONTRAST:  128mL ISOVUE-300 IOPAMIDOL (ISOVUE-300) INJECTION 61% COMPARISON:  10/17/2017 FINDINGS: Lower chest: Unremarkable Hepatobiliary: Diffuse hepatic steatosis. Cholecystectomy. No appreciable focal hepatic mass. Pancreas: There is a hypodense mass in the uncinate process of the pancreas suspicious for pancreatic adenocarcinoma. Size: 3.0 by 2.8 by 2.4 cm Location: Uncinate  Characterization: Solid Enhancement: Hypoenhancing Other Characteristics: No calcification. Mildly ill-defined margins. Local extent of mass: No definite duodenal invasion. Vascular Involvement: The mass abuts the posterior margin of the superior mesenteric artery and also abuts the posterior margin of the superior mesenteric vein. No overt encasement of either of the structures. The posterior margin of the mass slightly abuts the left renal vein. Variant hepatic artery anatomy: Conventional Bile Duct Involvement: Absent Variant biliary anatomy: Not appreciated Adjacent Nodes: Portacaval node 0.8 cm in short axis on image 30/4, and somewhat indistinctly marginated. Peripancreatic node 0.6 cm in short axis on image 28/4. Omental/Peritoneal Disease: Absent Distant Metastases: Absent Spleen: Unremarkable Adrenals/Urinary Tract: 2.6 by 2.8 cm enhancing exophytic mass of the left mid kidney laterally within anterior cystic component but primarily solid, most compatible with a renal cell carcinoma. The adrenal glands appear normal. Suspected urethral diverticulum or periurethral cyst somewhat eccentric to the right. Small bladder cellule or diverticulum on the left. Stomach/Bowel: Wall thickening in the stomach antrum is most likely physiologic or due to contraction. Fatty prominence of the ileocecal valve. Descending and sigmoid colon diverticulosis. Upper normal diameter of the appendix at 7 mm. Vascular/Lymphatic: No tumor thrombus in the left renal vein. The pancreatic mass abuts the SMA, SMV, and left renal vein as noted above in the pancreas section. Small peripancreatic/porta hepatis lymph nodes as noted above. No overt pathologic periaortic adenopathy at this time. Reproductive: Unremarkable Other: Trace free pelvic fluid. Musculoskeletal: Umbilical hernia contains adipose tissue. IMPRESSION: 1. Hypoenhancing 3.0 cm mass of the uncinate process of the pancreas. The lesion abuts the SMV, SMA, and left renal vein  without true encasement at this time. Appearance suspicious for pancreatic adenocarcinoma, focal pancreatitis  considered a less likely differential diagnostic consideration. Metastatic disease from the left renal mass is likewise considered less likely given the difference in overall characteristics between the pancreatic and renal lesions. Further characteristics of the pancreatic tumor are enumerated above. 2. There is also a 2.8 cm enhancing exophytic mass of the left mid kidney compatible with renal cell carcinoma. No tumor thrombus in the left renal vein or adjacent periaortic adenopathy. 3. No evidence of hepatic or other metastatic lesions. 4. Other imaging findings of potential clinical significance: Diffuse hepatic steatosis. Descending and sigmoid colon diverticulosis. Trace free pelvic fluid. Umbilical hernia contains adipose tissue. Electronically Signed   By: Van Clines M.D.   On: 10/17/2017 10:27   Ct Renal Stone Study  Result Date: 10/17/2017 CLINICAL DATA:  Mid to lower back pain on the left for a few days EXAM: CT ABDOMEN AND PELVIS WITHOUT CONTRAST TECHNIQUE: Multidetector CT imaging of the abdomen and pelvis was performed following the standard protocol without IV contrast. COMPARISON:  None. FINDINGS: Lower chest: Ground-glass density in the right lower lobe is attributed to scarring from bulky osteophytes. Hepatobiliary: Hepatic steatosis.Cholecystectomy with normal common bile duct diameter. Pancreas: Abnormal fullness at the level of the uncinate process with haziness of surrounding fat. Soft tissue indistinguishable from the proximal SMA. No ductal dilatation or evident collection. Spleen: Unremarkable. Adrenals/Urinary Tract: Negative adrenals. No hydronephrosis or stone. 2.7 cm left renal lesion measuring cystic density. Unremarkable bladder. There is a C-shaped low-density right of the urethra measuring 19 mm in length by 8 mm in thickness, most likely a urethral diverticulum.  Stomach/Bowel: Extensive colonic diverticulosis distally. No obstruction . No appendicitis Vascular/Lymphatic: No acute vascular abnormality. No mass or adenopathy. Reproductive:No pathologic findings. Other: No ascites or pneumoperitoneum.  Fatty umbilical hernia. Musculoskeletal: No acute abnormalities. Spondylosis and disc degeneration. These results were called by telephone at the time of interpretation on 10/17/2017 at 8:55 am to Dr. Lenise Arena , who verbally acknowledged these results. IMPRESSION: 1. Mass versus focal pancreatitis at the uncinate process, appearance more concerning for carcinoma than inflammation. Please correlate with serum enzymes and obtained postcontrast pancreas protocol CT. 2. Urethral diverticulum. 3. Hepatic steatosis. 4. Colonic diverticulosis. 5. Fatty supra umbilical hernia. Electronically Signed   By: Monte Fantasia M.D.   On: 10/17/2017 08:58       Today   Subjective:   Cindy Bowman patient doing better denies any abdominal pain currently pain under control  Objective:   Blood pressure (!) 131/54, pulse 77, temperature 98.7 F (37.1 C), temperature source Oral, resp. rate 17, height 5\' 3"  (1.6 m), weight 80.3 kg, SpO2 98 %.  .  Intake/Output Summary (Last 24 hours) at 10/19/2017 1328 Last data filed at 10/19/2017 1156 Gross per 24 hour  Intake 800 ml  Output 300 ml  Net 500 ml    Exam VITAL SIGNS: Blood pressure (!) 131/54, pulse 77, temperature 98.7 F (37.1 C), temperature source Oral, resp. rate 17, height 5\' 3"  (1.6 m), weight 80.3 kg, SpO2 98 %.  GENERAL:  67 y.o.-year-old patient lying in the bed with no acute distress.  EYES: Pupils equal, round, reactive to light and accommodation. No scleral icterus. Extraocular muscles intact.  HEENT: Head atraumatic, normocephalic. Oropharynx and nasopharynx clear.  NECK:  Supple, no jugular venous distention. No thyroid enlargement, no tenderness.  LUNGS: Normal breath sounds bilaterally, no  wheezing, rales,rhonchi or crepitation. No use of accessory muscles of respiration.  CARDIOVASCULAR: S1, S2 normal. No murmurs, rubs, or gallops.  ABDOMEN:  Soft, nontender, nondistended. Bowel sounds present. No organomegaly or mass.  EXTREMITIES: No pedal edema, cyanosis, or clubbing.  NEUROLOGIC: Cranial nerves II through XII are intact. Muscle strength 5/5 in all extremities. Sensation intact. Gait not checked.  PSYCHIATRIC: The patient is alert and oriented x 3.  SKIN: No obvious rash, lesion, or ulcer.   Data Review     CBC w Diff:  Lab Results  Component Value Date   WBC 11.0 10/19/2017   HGB 11.2 (L) 10/19/2017   HCT 32.9 (L) 10/19/2017   PLT 287 10/19/2017   LYMPHOPCT 31 10/18/2017   MONOPCT 6 10/18/2017   EOSPCT 1 10/18/2017   BASOPCT 1 10/18/2017   CMP:  Lab Results  Component Value Date   NA 142 10/19/2017   K 4.3 10/19/2017   CL 108 10/19/2017   CO2 26 10/19/2017   BUN 17 10/19/2017   CREATININE 1.22 (H) 10/19/2017   PROT 8.3 (H) 10/18/2017   ALBUMIN 4.1 10/18/2017   BILITOT 0.8 10/18/2017   ALKPHOS 115 10/18/2017   AST 30 10/18/2017   ALT 20 10/18/2017  .  Micro Results No results found for this or any previous visit (from the past 240 hour(s)).      Code Status Orders  (From admission, onward)         Start     Ordered   10/18/17 1501  Full code  Continuous     10/18/17 1500        Code Status History    This patient has a current code status but no historical code status.          Follow-up Information    Lloyd Huger, MD Follow up in 5 day(s).   Specialty:  Oncology Why:  pancreatis mass Contact information: [place] [address] [phone]        Hollice Espy, MD Follow up in 1 week(s).   Specialty:  Urology Why:  renal mass Contact information: [address] [address] [phone]        Glendon Axe, MD Follow up in 6 day(s).   Specialty:   Internal Medicine Why:  hosp f/u Contact information: [address] [address] [phone]           Discharge Medications   Allergies as of 10/19/2017      Reactions   Nsaids Other (See Comments)   Decreased GFR   Ibuprofen    Lowers kidney function   Penicillins    Yeast infection Has patient had a PCN reaction causing immediate rash, facial/tongue/throat swelling, SOB or lightheadedness with hypotension: No Has patient had a PCN reaction causing severe rash involving mucus membranes or skin necrosis: No Has patient had a PCN reaction that required hospitalization: No Has patient had a PCN reaction occurring within the last 10 years: No If all of the above answers are "NO", then may proceed with Cephalosporin use.      Medication List    STOP taking these medications   cephALEXin 250 MG capsule Commonly known as:  KEFLEX   predniSONE 5 MG (21) Tbpk tablet Commonly known as:  STERAPRED UNI-PAK 21 TAB     TAKE these medications   docusate sodium 100 MG capsule Commonly known as:  COLACE Take 2 capsules (200 mg total) by mouth 2 (two) times daily.   HYDROmorphone 2 MG tablet Commonly known as:  DILAUDID Take 1 tablet (2 mg total)  by mouth every 12 (twelve) hours as needed for severe pain.   losartan-hydrochlorothiazide 50-12.5 MG tablet Commonly known as:  HYZAAR Take 1 tablet by mouth daily.   ondansetron 4 MG disintegrating tablet Commonly known as:  ZOFRAN-ODT Take 1 tablet (4 mg total) by mouth every 8 (eight) hours as needed for nausea or vomiting.   ondansetron 4 MG tablet Commonly known as:  ZOFRAN Take 1 tablet (4 mg total) by mouth every 6 (six) hours as needed for nausea.   oxyCODONE 10 mg 12 hr tablet Commonly known as:  OXYCONTIN Take 1 tablet (10 mg total) by mouth every 12 (twelve) hours.   senna 8.6 MG Tabs tablet Commonly known as:  SENOKOT Take 1 tablet (8.6 mg total) by mouth at bedtime.           Total Time in preparing paper work, data evaluation and todays exam - 1 minutes  Dustin Flock M.D on 10/19/2017 at 1:28 PM [place]  [phone]

## 2017-10-19 NOTE — Progress Notes (Signed)
Chaplain received OR for AD. Chaplain went to patient room and completed the AD and offered prayer.

## 2017-10-20 LAB — URINE CULTURE: CULTURE: NO GROWTH

## 2017-10-20 LAB — HIV ANTIBODY (ROUTINE TESTING W REFLEX): HIV Screen 4th Generation wRfx: NONREACTIVE

## 2017-10-22 NOTE — Progress Notes (Signed)
[place]  [phone] [phone]  ID: Cindy Bowman OB: [birth year]  MR#: [id number]  CXK#:[id number]  Patient Care Team: Glendon Axe, MD as PCP - General (Internal Medicine) Clent Jacks, RN as Registered Nurse  CHIEF COMPLAINT: Clinical stage Ib pancreatic adenocarcinoma.  INTERVAL HISTORY: Patient returns to clinic today for hospital follow-up and additional diagnostic planning.  She continues to have abdominal pain radiating to her back, but this has improved with her current narcotics.  She has an EUS scheduled tomorrow.  She is anxious, but otherwise feels well. She has no neurologic complaints.  She denies any recent fevers or illnesses.    She has a poor appetite, but denies weight loss.  She denies any chest pain or shortness of breath.  She has no nausea, vomiting, constipation, or diarrhea.  She has no urinary complaints.  Patient offers no further specific complaints today.  REVIEW OF SYSTEMS:   Review of Systems  Constitutional: Negative.  Negative for fever, malaise/fatigue and weight loss.  Respiratory: Negative.  Negative for cough and shortness of breath.   Cardiovascular: Negative.  Negative for chest pain and leg swelling.  Gastrointestinal: Positive for abdominal pain. Negative for blood in stool, melena, nausea and vomiting.  Genitourinary: Negative.  Negative for dysuria.  Musculoskeletal: Negative.  Negative for back pain.  Skin: Negative.  Negative for rash.  Neurological: Negative.  Negative for dizziness, focal weakness, weakness and headaches.  Psychiatric/Behavioral: The patient is nervous/anxious.     As per HPI. Otherwise, a complete review of systems is negative.  PAST MEDICAL HISTORY: Past Medical History:  Diagnosis Date  . Essential hypertension   . UTI (urinary tract infection)     PAST SURGICAL HISTORY: Past Surgical History:  Procedure Laterality Date  . CESAREAN SECTION    . CHOLECYSTECTOMY      . EUS N/A 10/25/2017   Procedure: FULL UPPER ENDOSCOPIC ULTRASOUND (EUS) RADIAL;  Surgeon: Jola Schmidt, MD;  Location: [facility] ENDOSCOPY;  Service: Endoscopy;  Laterality: N/A;  . OOPHORECTOMY    . TUBAL LIGATION      FAMILY HISTORY: Family History  Problem Relation Age of Onset  . Hypertension Mother   . Heart attack Father     ADVANCED DIRECTIVES (Y/N):  N  HEALTH MAINTENANCE: Social History   Tobacco Use  . Smoking status: Never Smoker  . Smokeless tobacco: Never Used  Substance Use Topics  . Alcohol use: Not Currently  . Drug use: Never     Colonoscopy:  PAP:  Bone density:  Lipid panel:  Allergies  Allergen Reactions  . Nsaids Other (See Comments)    Decreased GFR  . Ibuprofen     Lowers kidney function  . Penicillins     Yeast infection  Has patient had a PCN reaction causing immediate rash, facial/tongue/throat swelling, SOB or lightheadedness with hypotension: No Has patient had a PCN reaction causing severe rash involving mucus membranes or skin necrosis: No Has patient had a PCN reaction that required hospitalization: No Has patient had a PCN reaction occurring within the last 10 years: No If all of the above answers are "NO", then may proceed with Cephalosporin use.     No current facility-administered medications for this visit.    Current Outpatient Medications  Medication Sig Dispense Refill  . Oxycodone HCl 10 MG TABS Take 1 tablet (10 mg total) by mouth every 6 (six) hours as needed. 40 tablet 0   Facility-Administered Medications Ordered in Other Visits  Medication Dose  Route Frequency Provider Last Rate Last Dose  . 0.9 %  sodium chloride infusion   Intravenous Continuous Jola Schmidt, MD 20 mL/hr at 10/25/17 1253      OBJECTIVE: Vitals:   10/24/17 0922 10/24/17 0928  BP:  137/88  Pulse:  96  Resp: 16   Temp:  97.9 F (36.6 C)     Body mass index is 30.72 kg/m.    ECOG FS:0 - Asymptomatic  General: Well-developed, well-nourished,  no acute distress. Eyes: Pink conjunctiva, anicteric sclera. HEENT: Normocephalic, moist mucous membranes, clear oropharnyx. Lungs: Clear to auscultation bilaterally. Heart: Regular rate and rhythm. No rubs, murmurs, or gallops. Abdomen: Soft, nontender, nondistended. No organomegaly noted, normoactive bowel sounds. Musculoskeletal: No edema, cyanosis, or clubbing. Neuro: Alert, answering all questions appropriately. Cranial nerves grossly intact. Skin: No rashes or petechiae noted. Psych: Normal affect. Lymphatics: No cervical, calvicular, axillary or inguinal LAD.   LAB RESULTS:  Lab Results  Component Value Date   NA 142 10/19/2017   K 4.3 10/19/2017   CL 108 10/19/2017   CO2 26 10/19/2017   GLUCOSE 103 (H) 10/19/2017   BUN 17 10/19/2017   CREATININE 1.22 (H) 10/19/2017   CALCIUM 8.7 (L) 10/19/2017   PROT 8.3 (H) 10/18/2017   ALBUMIN 4.1 10/18/2017   AST 30 10/18/2017   ALT 20 10/18/2017   ALKPHOS 115 10/18/2017   BILITOT 0.8 10/18/2017   GFRNONAA 44 (L) 10/19/2017   GFRAA 52 (L) 10/19/2017    Lab Results  Component Value Date   WBC 11.0 10/19/2017   NEUTROABS 9.7 (H) 10/18/2017   HGB 11.2 (L) 10/19/2017   HCT 32.9 (L) 10/19/2017   MCV 94.1 10/19/2017   PLT 287 10/19/2017     STUDIES: Ct Abdomen Pelvis W Contrast  Result Date: 10/17/2017 CLINICAL DATA:  Pancreatic lesion on earlier noncontrast CT, for further assessment. EXAM: CT ABDOMEN AND PELVIS WITH CONTRAST TECHNIQUE: Multidetector CT imaging of the abdomen and pelvis was performed using the standard protocol following bolus administration of intravenous contrast. CONTRAST:  155mL ISOVUE-300 IOPAMIDOL (ISOVUE-300) INJECTION 61% COMPARISON:  10/17/2017 FINDINGS: Lower chest: Unremarkable Hepatobiliary: Diffuse hepatic steatosis. Cholecystectomy. No appreciable focal hepatic mass. Pancreas: There is a hypodense mass in the uncinate process of the pancreas suspicious for pancreatic adenocarcinoma. Size: 3.0 by  2.8 by 2.4 cm Location: Uncinate Characterization: Solid Enhancement: Hypoenhancing Other Characteristics: No calcification. Mildly ill-defined margins. Local extent of mass: No definite duodenal invasion. Vascular Involvement: The mass abuts the posterior margin of the superior mesenteric artery and also abuts the posterior margin of the superior mesenteric vein. No overt encasement of either of the structures. The posterior margin of the mass slightly abuts the left renal vein. Variant hepatic artery anatomy: Conventional Bile Duct Involvement: Absent Variant biliary anatomy: Not appreciated Adjacent Nodes: Portacaval node 0.8 cm in short axis on image 30/4, and somewhat indistinctly marginated. Peripancreatic node 0.6 cm in short axis on image 28/4. Omental/Peritoneal Disease: Absent Distant Metastases: Absent Spleen: Unremarkable Adrenals/Urinary Tract: 2.6 by 2.8 cm enhancing exophytic mass of the left mid kidney laterally within anterior cystic component but primarily solid, most compatible with a renal cell carcinoma. The adrenal glands appear normal. Suspected urethral diverticulum or periurethral cyst somewhat eccentric to the right. Small bladder cellule or diverticulum on the left. Stomach/Bowel: Wall thickening in the stomach antrum is most likely physiologic or due to contraction. Fatty prominence of the ileocecal valve. Descending and sigmoid colon diverticulosis. Upper normal diameter of the appendix at 7 mm. Vascular/Lymphatic: No  tumor thrombus in the left renal vein. The pancreatic mass abuts the SMA, SMV, and left renal vein as noted above in the pancreas section. Small peripancreatic/porta hepatis lymph nodes as noted above. No overt pathologic periaortic adenopathy at this time. Reproductive: Unremarkable Other: Trace free pelvic fluid. Musculoskeletal: Umbilical hernia contains adipose tissue. IMPRESSION: 1. Hypoenhancing 3.0 cm mass of the uncinate process of the pancreas. The lesion abuts the  SMV, SMA, and left renal vein without true encasement at this time. Appearance suspicious for pancreatic adenocarcinoma, focal pancreatitis considered a less likely differential diagnostic consideration. Metastatic disease from the left renal mass is likewise considered less likely given the difference in overall characteristics between the pancreatic and renal lesions. Further characteristics of the pancreatic tumor are enumerated above. 2. There is also a 2.8 cm enhancing exophytic mass of the left mid kidney compatible with renal cell carcinoma. No tumor thrombus in the left renal vein or adjacent periaortic adenopathy. 3. No evidence of hepatic or other metastatic lesions. 4. Other imaging findings of potential clinical significance: Diffuse hepatic steatosis. Descending and sigmoid colon diverticulosis. Trace free pelvic fluid. Umbilical hernia contains adipose tissue. Electronically Signed   By: Van Clines M.D.   On: 10/17/2017 10:27   Ct Renal Stone Study  Result Date: 10/17/2017 CLINICAL DATA:  Mid to lower back pain on the left for a few days EXAM: CT ABDOMEN AND PELVIS WITHOUT CONTRAST TECHNIQUE: Multidetector CT imaging of the abdomen and pelvis was performed following the standard protocol without IV contrast. COMPARISON:  None. FINDINGS: Lower chest: Ground-glass density in the right lower lobe is attributed to scarring from bulky osteophytes. Hepatobiliary: Hepatic steatosis.Cholecystectomy with normal common bile duct diameter. Pancreas: Abnormal fullness at the level of the uncinate process with haziness of surrounding fat. Soft tissue indistinguishable from the proximal SMA. No ductal dilatation or evident collection. Spleen: Unremarkable. Adrenals/Urinary Tract: Negative adrenals. No hydronephrosis or stone. 2.7 cm left renal lesion measuring cystic density. Unremarkable bladder. There is a C-shaped low-density right of the urethra measuring 19 mm in length by 8 mm in thickness, most  likely a urethral diverticulum. Stomach/Bowel: Extensive colonic diverticulosis distally. No obstruction . No appendicitis Vascular/Lymphatic: No acute vascular abnormality. No mass or adenopathy. Reproductive:No pathologic findings. Other: No ascites or pneumoperitoneum.  Fatty umbilical hernia. Musculoskeletal: No acute abnormalities. Spondylosis and disc degeneration. These results were called by telephone at the time of interpretation on 10/17/2017 at 8:55 am to Dr. Lenise Arena , who verbally acknowledged these results. IMPRESSION: 1. Mass versus focal pancreatitis at the uncinate process, appearance more concerning for carcinoma than inflammation. Please correlate with serum enzymes and obtained postcontrast pancreas protocol CT. 2. Urethral diverticulum. 3. Hepatic steatosis. 4. Colonic diverticulosis. 5. Fatty supra umbilical hernia. Electronically Signed   By: Monte Fantasia M.D.   On: 10/17/2017 08:58    ASSESSMENT: Clinical stage Ib pancreatic adenocarcinoma.  PLAN:    1.  Clinical stage Ib pancreatic adenocarcinoma: EUS completed on October 25, 2017 confirmed adenocarcinoma. Patient CA-19-9 is mildly elevated at 78.  Patient will require a PET scan to complete the staging work-up.  Return to clinic on October 31, 2017 to discuss the results and treatment planning.  Patient will likely benefit from neoadjuvant chemotherapy followed by surgical resection.  2.  Intractable abdominal pain: Improving.  Patient is given a prescription for oxycodone today. 3.  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  Continue to monitor while pancreatic mass is being worked up.  No intervention is  needed.  Patient has follow-up with urology on November 20, 2017.  I spent a total of 30 minutes face-to-face with the patient of which greater than 50% of the visit was spent in counseling and coordination of care as detailed above.   Patient expressed understanding and was in agreement with this  plan. She also understands that She can call clinic at any time with any questions, concerns, or complaints.   Cancer Staging Pancreatic adenocarcinoma [facility]) Staging form: Exocrine Pancreas, AJCC 8th Edition - Clinical stage from 10/26/2017: Stage IB (cT2, cN0, cM0) - Signed by Lloyd Huger, MD on 10/26/2017   Lloyd Huger, MD   10/26/2017 4:08 PM

## 2017-10-23 ENCOUNTER — Other Ambulatory Visit: Payer: Self-pay

## 2017-10-23 NOTE — Progress Notes (Signed)
Cindy Bowman returned call. She was unaware of her appointment for hospital follow up. Appointment details reviewed and directions to cancer center given. Briefly went over EUS and instructions. I will meet with her in the am and go over all instructions and provide her written copy.

## 2017-10-24 ENCOUNTER — Other Ambulatory Visit: Payer: Self-pay | Admitting: *Deleted

## 2017-10-24 ENCOUNTER — Other Ambulatory Visit: Payer: Self-pay

## 2017-10-24 ENCOUNTER — Inpatient Hospital Stay: Payer: Medicare Other | Attending: Oncology | Admitting: Oncology

## 2017-10-24 ENCOUNTER — Encounter: Payer: Self-pay | Admitting: Oncology

## 2017-10-24 VITALS — BP 137/88 | HR 96 | Temp 97.9°F | Resp 16 | Ht 63.0 in | Wt 173.4 lb

## 2017-10-24 DIAGNOSIS — R970 Elevated carcinoembryonic antigen [CEA]: Secondary | ICD-10-CM | POA: Diagnosis not present

## 2017-10-24 DIAGNOSIS — Z5111 Encounter for antineoplastic chemotherapy: Secondary | ICD-10-CM | POA: Insufficient documentation

## 2017-10-24 DIAGNOSIS — I10 Essential (primary) hypertension: Secondary | ICD-10-CM | POA: Diagnosis not present

## 2017-10-24 DIAGNOSIS — N2889 Other specified disorders of kidney and ureter: Secondary | ICD-10-CM | POA: Diagnosis not present

## 2017-10-24 DIAGNOSIS — C259 Malignant neoplasm of pancreas, unspecified: Secondary | ICD-10-CM | POA: Diagnosis present

## 2017-10-24 DIAGNOSIS — R109 Unspecified abdominal pain: Secondary | ICD-10-CM | POA: Diagnosis not present

## 2017-10-24 MED ORDER — OXYCODONE HCL 5 MG PO TABS: 10.0000 mg | ORAL_TABLET | ORAL | 0 refills | Status: DC | PRN

## 2017-10-24 MED ORDER — OXYCODONE HCL ER 10 MG PO T12A: 10.0000 mg | EXTENDED_RELEASE_TABLET | Freq: Two times a day (BID) | ORAL | 0 refills | Status: DC

## 2017-10-24 NOTE — Telephone Encounter (Signed)
What will they approve?

## 2017-10-24 NOTE — Telephone Encounter (Signed)
According to [employer] medicine needs a prior authorization and anything ordered will be the same. I asked that they fax prior authorization request to our office and gave him Pod 2 fax number

## 2017-10-24 NOTE — Progress Notes (Signed)
Met with Ms. Maul prior to follow up appointment with Dr. Grayland Ormond. Fully introduced nurse navigator services and provided contact information for any further needs. Educated further on EUS. Went over instructions for EUS and provided written copy. Denies anticoagulant use. Denies diabetes. EUS is scheduled for 10/25/17. Oncology Nurse Navigator Documentation  Navigator Location: [facility] (10/24/17 1100)   )Navigator Encounter Type: Initial MedOnc(Hospital follow up) (10/24/17 1100) Telephone: Education (10/24/17 1100)                   Patient Visit Type: MedOnc;Initial (10/24/17 1100)   Barriers/Navigation Needs: Coordination of Care (10/24/17 1100)   Interventions: Coordination of Care (10/24/17 1100)   Coordination of Care: EUS (10/24/17 1100)                  Time Spent with Patient: 30 (10/24/17 1100)

## 2017-10-24 NOTE — Telephone Encounter (Signed)
Cindy Bowman has been submitted, waiting for determination at this time.

## 2017-10-24 NOTE — Telephone Encounter (Signed)
Patient insurance denying Oxy ER 10 mg and pharmacy requesting an alternative medicine be sent in for her. Please send in new prescription ASAP

## 2017-10-25 ENCOUNTER — Encounter
Admission: RE | Disposition: A | Discharge status: Home / Self Care | Payer: Self-pay | Source: Ambulatory Visit | Attending: Gastroenterology

## 2017-10-25 ENCOUNTER — Ambulatory Visit: Payer: Medicare Other | Admitting: Certified Registered Nurse Anesthetist

## 2017-10-25 ENCOUNTER — Ambulatory Visit
Admission: RE | Admit: 2017-10-25 | Discharge: 2017-10-25 | Disposition: A | Discharge status: Home / Self Care | Payer: Medicare Other | Source: Ambulatory Visit | Attending: Gastroenterology | Admitting: Gastroenterology

## 2017-10-25 ENCOUNTER — Encounter: Payer: Self-pay | Admitting: *Deleted

## 2017-10-25 DIAGNOSIS — Z79899 Other long term (current) drug therapy: Secondary | ICD-10-CM | POA: Insufficient documentation

## 2017-10-25 DIAGNOSIS — C250 Malignant neoplasm of head of pancreas: Secondary | ICD-10-CM | POA: Diagnosis not present

## 2017-10-25 DIAGNOSIS — N2889 Other specified disorders of kidney and ureter: Secondary | ICD-10-CM | POA: Diagnosis not present

## 2017-10-25 DIAGNOSIS — I10 Essential (primary) hypertension: Secondary | ICD-10-CM | POA: Diagnosis not present

## 2017-10-25 DIAGNOSIS — K8689 Other specified diseases of pancreas: Secondary | ICD-10-CM | POA: Diagnosis present

## 2017-10-25 HISTORY — PX: EUS: SHX5427

## 2017-10-25 HISTORY — DX: Urinary tract infection, site not specified: N39.0

## 2017-10-25 SURGERY — ULTRASOUND, UPPER GI TRACT, ENDOSCOPIC
Anesthesia: General

## 2017-10-25 MED ORDER — OXYCODONE HCL 10 MG PO TABS: 10.0000 mg | ORAL_TABLET | Freq: Four times a day (QID) | ORAL | 0 refills | Status: DC | PRN

## 2017-10-25 MED ORDER — PROPOFOL 10 MG/ML IV BOLUS
INTRAVENOUS | Status: DC | PRN
  Administered 2017-10-25: 40 mg via INTRAVENOUS

## 2017-10-25 MED ORDER — LIDOCAINE HCL (CARDIAC) PF 100 MG/5ML IV SOSY
PREFILLED_SYRINGE | INTRAVENOUS | Status: DC | PRN
  Administered 2017-10-25: 50 mg via INTRAVENOUS

## 2017-10-25 MED ORDER — FENTANYL CITRATE (PF) 100 MCG/2ML IJ SOLN
INTRAMUSCULAR | Status: DC | PRN
  Administered 2017-10-25 (×2): 50 ug via INTRAVENOUS

## 2017-10-25 MED ORDER — FENTANYL CITRATE (PF) 100 MCG/2ML IJ SOLN
INTRAMUSCULAR | Status: AC
  Filled 2017-10-25: qty 2

## 2017-10-25 MED ORDER — PROPOFOL 500 MG/50ML IV EMUL
INTRAVENOUS | Status: DC | PRN
  Administered 2017-10-25: 100 ug/kg/min via INTRAVENOUS

## 2017-10-25 MED ORDER — SODIUM CHLORIDE 0.9 % IV SOLN
INTRAVENOUS | Status: DC
  Administered 2017-10-25: 13:00:00 via INTRAVENOUS

## 2017-10-25 MED ORDER — PHENYLEPHRINE HCL 10 MG/ML IJ SOLN
INTRAMUSCULAR | Status: DC | PRN
  Administered 2017-10-25: 100 ug via INTRAVENOUS

## 2017-10-25 NOTE — Transfer of Care (Signed)
Immediate Anesthesia Transfer of Care Note  Patient: Cindy Bowman  Procedure(s) Performed: FULL UPPER ENDOSCOPIC ULTRASOUND (EUS) RADIAL (N/A )  Patient Location: PACU  Anesthesia Type:MAC  Level of Consciousness: awake, alert  and oriented  Airway & Oxygen Therapy: Patient Spontanous Breathing and Patient connected to nasal cannula oxygen  Post-op Assessment: Report given to RN and Post -op Vital signs reviewed and stable  Post vital signs: stable  Last Vitals:  Vitals Value Taken Time  BP 96/41 10/25/2017  2:02 PM  Temp 36.3 C 10/25/2017  2:02 PM  Pulse 82 10/25/2017  2:02 PM  Resp 19 10/25/2017  2:02 PM  SpO2 100 % 10/25/2017  2:02 PM  Vitals shown include unvalidated device data.  Last Pain:  Vitals:   10/25/17 1402  TempSrc: Tympanic  PainSc: Asleep         Complications: No apparent anesthesia complications

## 2017-10-25 NOTE — Anesthesia Post-op Follow-up Note (Signed)
Anesthesia QCDR form completed.        

## 2017-10-25 NOTE — Anesthesia Postprocedure Evaluation (Signed)
Anesthesia Post Note  Patient: Cindy Bowman  Procedure(s) Performed: FULL UPPER ENDOSCOPIC ULTRASOUND (EUS) RADIAL (N/A )  Patient location during evaluation: Endoscopy Anesthesia Type: General Level of consciousness: awake and alert Pain management: pain level controlled Vital Signs Assessment: post-procedure vital signs reviewed and stable Respiratory status: spontaneous breathing, nonlabored ventilation, respiratory function stable and patient connected to nasal cannula oxygen Cardiovascular status: blood pressure returned to baseline and stable Postop Assessment: no apparent nausea or vomiting Anesthetic complications: no     Last Vitals:  Vitals:   10/25/17 1237 10/25/17 1402  BP: 137/79 (!) 96/41  Pulse: 100 82  Resp: 16 (!) 24  Temp: (!) 36.4 C (!) 36.3 C  SpO2: 100% 100%    Last Pain:  Vitals:   10/25/17 1402  TempSrc: Tympanic  PainSc: Asleep                 Leniyah Martell S

## 2017-10-25 NOTE — Anesthesia Preprocedure Evaluation (Signed)
Anesthesia Evaluation  Patient identified by MRN, date of birth, ID band Patient awake    Reviewed: Allergy & Precautions, NPO status , Patient's Chart, lab work & pertinent test results, reviewed documented beta blocker date and time   Airway Mallampati: II  TM Distance: >3 FB     Dental  (+) Chipped   Pulmonary           Cardiovascular hypertension, Pt. on medications      Neuro/Psych    GI/Hepatic   Endo/Other    Renal/GU      Musculoskeletal   Abdominal   Peds  Hematology   Anesthesia Other Findings Abdominal pain - pancreatic mass.  Reproductive/Obstetrics                             Anesthesia Physical Anesthesia Plan  ASA: III  Anesthesia Plan: General   Post-op Pain Management:    Induction: Intravenous  PONV Risk Score and Plan:   Airway Management Planned:   Additional Equipment:   Intra-op Plan:   Post-operative Plan:   Informed Consent: I have reviewed the patients History and Physical, chart, labs and discussed the procedure including the risks, benefits and alternatives for the proposed anesthesia with the patient or authorized representative who has indicated his/her understanding and acceptance.     Plan Discussed with: CRNA  Anesthesia Plan Comments:         Anesthesia Quick Evaluation

## 2017-10-25 NOTE — Op Note (Addendum)
[facility] Gastroenterology Patient Name: Cindy Bowman Procedure Date: 10/25/2017 1:02 PM MRN: [id number] Account #: [id number] Date of Birth: 12/26/1950 Admit Type: Outpatient Age: 67 Room: [facility] ENDO ROOM 3 Gender: Female Note Status: Finalized Procedure:            Upper EUS Indications:          Suspected mass in pancreas on CT scan, Abnormal                        abdominal/pelvic CT scan, Epigastric abdominal pain Providers:            [name] Referring MD:         Glendon Axe (Referring MD), Kathlene November. Grayland Ormond, MD                        (Referring MD) Medicines:            Monitored Anesthesia Care Complications:        No immediate complications. Procedure:            Pre-Anesthesia Assessment:                       - Please see pre-anesthesia assessment documentation                        already completed in Epic.                       After obtaining informed consent, the endoscope was                        passed under direct vision. Throughout the procedure,                        the patient's blood pressure, pulse, and oxygen                        saturations were monitored continuously. The Endoscope                        was introduced through the mouth, and advanced to the                        duodenum for ultrasound examination from the stomach                        and duodenum. The upper EUS was accomplished without                        difficulty. The patient tolerated the procedure well. Findings:      ENDOSCOPIC FINDING: :      The examined esophagus was endoscopically normal.      The entire examined stomach was endoscopically normal.      The examined duodenum was endoscopically normal.      ENDOSONOGRAPHIC FINDING: :      A round mass was identified in the uncinate process of the pancreas. The       mass was heterogenous. The mass measured 31 mm by 25 mm in maximal       cross-sectional diameter. The endosonographic  borders were  poorly-defined. There was sonographic evidence suggesting invasion into       the superior mesenteric artery (manifested by abutment) and the superior       mesenteric vein (manifested by abutment). The remainder of the pancreas       was examined. The endosonographic appearance of parenchyma and the       upstream pancreatic duct indicated no duct dilation and that the       remainder of the pancreas was unremarkable. Fine needle biopsy was       performed. Color Doppler imaging was utilized prior to needle puncture       to confirm a lack of significant vascular structures within the needle       path. Five passes were made with the 25 gauge SharkCore biopsy needle       using a transduodenal approach. A visible core of tissue was obtained. A       preliminary cytologic examination was performed. Final cytology results       are pending.      There was no sign of significant endosonographic abnormality in the       common bile duct and in the common hepatic duct. CBC 4 mm.      Endosonographic imaging in the visualized portion of the liver showed no       lesion.      One abnormal lymph node was visualized in the peripancreatic region       adjacent to the head of pancreas. It measured 8 mm by 7 mm in maximal       cross-sectional diameter. The node was round, hypoechoic and had well       defined margins. Impression:           - Normal esophagus.                       - Normal stomach.                       - Normal examined duodenum.                       EUS:                       - A mass was identified in the uncinate process of the                        pancreas. Tissue was obtained from this exam, and                        results are pending. However, the endosonographic                        appearance is suggestive of adenocarcinoma. This was                        staged T4 N1 Mx by endosonographic criteria. The                        staging applies  if malignancy is confirmed. Fine needle                        biopsy performed.                       -  There was no sign of significant pathology in the                        common bile duct and in the common hepatic duct.                       - One abnormal lymph node was visualized in the                        peripancreatic region. Tissue has not been obtained.                        However, the endosonographic appearance is suggestive                        of metastatic pancreatic adenocarcinoma. Recommendation:       - Patient has a contact number available for                        emergencies. The signs and symptoms of potential                        delayed complications were discussed with the patient.                        Return to normal activities tomorrow. Written discharge                        instructions were provided to the patient.                       - Await cytology results.                       - Return to referring physician.                       - The findings and recommendations were discussed with                        the patient and their family. Procedure Code(s):    --- Professional ---                       [phone], Esophagogastroduodenoscopy, flexible, transoral;                        with transendoscopic ultrasound-guided intramural or                        transmural fine needle aspiration/biopsy(s), (includes                        endoscopic ultrasound examination limited to the                        esophagus, stomach or duodenum, and adjacent structures) Diagnosis Code(s):    --- Professional ---                       R93.3, Abnormal findings on diagnostic imaging of other  parts of digestive tract                       R93.5, Abnormal findings on diagnostic imaging of other                        abdominal regions, including retroperitoneum                       I89.9, Noninfective disorder of lymphatic  vessels and                        lymph nodes, unspecified                       K86.89, Other specified diseases of pancreas CPT copyright 2017 [employer]. All rights reserved. The codes documented in this report are preliminary and upon coder review may  be revised to meet current compliance requirements. [name],  10/25/2017 2:14:46 PM This report has been signed electronically. Number of Addenda: 0 Note Initiated On: 10/25/2017 1:02 PM      [facility]

## 2017-10-25 NOTE — Interval H&P Note (Signed)
History and Physical Interval Note:  10/25/2017 12:50 PM  Cindy Bowman  has presented today for surgery, with the diagnosis of Pancreatic mass  The various methods of treatment have been discussed with the patient and family. After consideration of risks, benefits and other options for treatment, the patient has consented to  Procedure(s): FULL UPPER ENDOSCOPIC ULTRASOUND (EUS) RADIAL (N/A) as a surgical intervention .  The patient's history has been reviewed, patient examined, no change in status, stable for surgery.  I have reviewed the patient's chart and labs.  Questions were answered to the patient's satisfaction.    Today her pain is at baseline of around 7/10 - mostly back pain. Abd soft with mild tenderness in epigastrium.   [name]

## 2017-10-26 ENCOUNTER — Other Ambulatory Visit: Payer: Self-pay | Admitting: Oncology

## 2017-10-26 ENCOUNTER — Encounter: Payer: Self-pay | Admitting: Gastroenterology

## 2017-10-26 LAB — CYTOLOGY - NON PAP

## 2017-10-28 NOTE — Progress Notes (Signed)
[place]  Telephone:([phone]) [phone] Fax:(336) [phone]  ID: Cindy Bowman OB: Jun 30, 1950  MR#: [id number]  NKN#:[id number]  Patient Care Team: Glendon Axe, MD as PCP - General (Internal Medicine) Clent Jacks, RN as Registered Nurse  CHIEF COMPLAINT: Clinical stage Ib pancreatic adenocarcinoma.  INTERVAL HISTORY: Patient returns to clinic today for further evaluation, discussion of her pathology and imaging results, and treatment planning.  Continues to have abdominal pain radiating to her back.  She continues to be anxious, but otherwise feels well.  She has no neurologic complaints.  She denies any recent fevers or illnesses.    She has a poor appetite, but denies weight loss.  She denies any chest pain or shortness of breath.  She has no nausea, vomiting, constipation, or diarrhea.  She has no urinary complaints.  Patient offers no further specific complaints today.  REVIEW OF SYSTEMS:   Review of Systems  Constitutional: Negative.  Negative for fever, malaise/fatigue and weight loss.  Respiratory: Negative.  Negative for cough and shortness of breath.   Cardiovascular: Negative.  Negative for chest pain and leg swelling.  Gastrointestinal: Positive for abdominal pain. Negative for blood in stool, melena, nausea and vomiting.  Genitourinary: Negative.  Negative for dysuria.  Musculoskeletal: Negative.  Negative for back pain.  Skin: Negative.  Negative for rash.  Neurological: Negative.  Negative for dizziness, focal weakness, weakness and headaches.  Psychiatric/Behavioral: The patient is nervous/anxious.     As per HPI. Otherwise, a complete review of systems is negative.  PAST MEDICAL HISTORY: Past Medical History:  Diagnosis Date  . Essential hypertension   . UTI (urinary tract infection)     PAST SURGICAL HISTORY: Past Surgical History:  Procedure Laterality Date  . CESAREAN SECTION    . CHOLECYSTECTOMY    . EUS N/A 10/25/2017   Procedure: FULL  UPPER ENDOSCOPIC ULTRASOUND (EUS) RADIAL;  Surgeon: Jola Schmidt, MD;  Location: [facility] ENDOSCOPY;  Service: Endoscopy;  Laterality: N/A;  . OOPHORECTOMY    . TUBAL LIGATION      FAMILY HISTORY: Family History  Problem Relation Age of Onset  . Hypertension Mother   . Heart attack Father     ADVANCED DIRECTIVES (Y/N):  N  HEALTH MAINTENANCE: Social History   Tobacco Use  . Smoking status: Never Smoker  . Smokeless tobacco: Never Used  Substance Use Topics  . Alcohol use: Not Currently  . Drug use: Never     Colonoscopy:  PAP:  Bone density:  Lipid panel:  Allergies  Allergen Reactions  . Nsaids Other (See Comments)    Decreased GFR  . Ibuprofen     Lowers kidney function  . Penicillins     Yeast infection  Has patient had a PCN reaction causing immediate rash, facial/tongue/throat swelling, SOB or lightheadedness with hypotension: No Has patient had a PCN reaction causing severe rash involving mucus membranes or skin necrosis: No Has patient had a PCN reaction that required hospitalization: No Has patient had a PCN reaction occurring within the last 10 years: No If all of the above answers are "NO", then may proceed with Cephalosporin use.     Current Outpatient Medications  Medication Sig Dispense Refill  . docusate sodium (COLACE) 100 MG capsule Take 2 capsules (200 mg total) by mouth 2 (two) times daily. 30 capsule 0  . losartan-hydrochlorothiazide (HYZAAR) 50-12.5 MG tablet Take 1 tablet by mouth daily.  1  . Oxycodone HCl 10 MG TABS Take 1 tablet (10 mg total) by mouth  every 6 (six) hours as needed. 40 tablet 0  . senna (SENOKOT) 8.6 MG TABS tablet Take 1 tablet (8.6 mg total) by mouth at bedtime. 120 each 0  . fentaNYL (DURAGESIC - DOSED MCG/HR) 25 MCG/HR patch Place 1 patch (25 mcg total) onto the skin every 3 (three) days. 10 patch 0  . fentaNYL (DURAGESIC - DOSED MCG/HR) 25 MCG/HR patch Place 1 patch (25 mcg total) onto the skin every 3 (three) days for 15  days. 5 patch 0  . ondansetron (ZOFRAN ODT) 4 MG disintegrating tablet Take 1 tablet (4 mg total) by mouth every 8 (eight) hours as needed for nausea or vomiting. (Patient not taking: Reported on 10/31/2017) 20 tablet 0  . ondansetron (ZOFRAN) 4 MG tablet Take 1 tablet (4 mg total) by mouth every 6 (six) hours as needed for nausea. (Patient not taking: Reported on 10/31/2017) 20 tablet 0   No current facility-administered medications for this visit.     OBJECTIVE: Vitals:   10/31/17 1055  BP: 133/73  Pulse: 81  Resp: 18  Temp: 97.9 F (36.6 C)     Body mass index is 30.16 kg/m.    ECOG FS:0 - Asymptomatic  General: Well-developed, well-nourished, no acute distress. Eyes: Pink conjunctiva, anicteric sclera. HEENT: Normocephalic, moist mucous membranes. Lungs: Clear to auscultation bilaterally. Heart: Regular rate and rhythm. No rubs, murmurs, or gallops. Abdomen: Soft, nontender, nondistended. No organomegaly noted, normoactive bowel sounds. Musculoskeletal: No edema, cyanosis, or clubbing. Neuro: Alert, answering all questions appropriately. Cranial nerves grossly intact. Skin: No rashes or petechiae noted. Psych: Normal affect.  LAB RESULTS:  Lab Results  Component Value Date   NA 142 10/19/2017   K 4.3 10/19/2017   CL 108 10/19/2017   CO2 26 10/19/2017   GLUCOSE 103 (H) 10/19/2017   BUN 17 10/19/2017   CREATININE 1.22 (H) 10/19/2017   CALCIUM 8.7 (L) 10/19/2017   PROT 8.3 (H) 10/18/2017   ALBUMIN 4.1 10/18/2017   AST 30 10/18/2017   ALT 20 10/18/2017   ALKPHOS 115 10/18/2017   BILITOT 0.8 10/18/2017   GFRNONAA 44 (L) 10/19/2017   GFRAA 52 (L) 10/19/2017    Lab Results  Component Value Date   WBC 11.0 10/19/2017   NEUTROABS 9.7 (H) 10/18/2017   HGB 11.2 (L) 10/19/2017   HCT 32.9 (L) 10/19/2017   MCV 94.1 10/19/2017   PLT 287 10/19/2017     STUDIES: Ct Abdomen Pelvis W Contrast  Result Date: 10/17/2017 CLINICAL DATA:  Pancreatic lesion on earlier  noncontrast CT, for further assessment. EXAM: CT ABDOMEN AND PELVIS WITH CONTRAST TECHNIQUE: Multidetector CT imaging of the abdomen and pelvis was performed using the standard protocol following bolus administration of intravenous contrast. CONTRAST:  168mL ISOVUE-300 IOPAMIDOL (ISOVUE-300) INJECTION 61% COMPARISON:  10/17/2017 FINDINGS: Lower chest: Unremarkable Hepatobiliary: Diffuse hepatic steatosis. Cholecystectomy. No appreciable focal hepatic mass. Pancreas: There is a hypodense mass in the uncinate process of the pancreas suspicious for pancreatic adenocarcinoma. Size: 3.0 by 2.8 by 2.4 cm Location: Uncinate Characterization: Solid Enhancement: Hypoenhancing Other Characteristics: No calcification. Mildly ill-defined margins. Local extent of mass: No definite duodenal invasion. Vascular Involvement: The mass abuts the posterior margin of the superior mesenteric artery and also abuts the posterior margin of the superior mesenteric vein. No overt encasement of either of the structures. The posterior margin of the mass slightly abuts the left renal vein. Variant hepatic artery anatomy: Conventional Bile Duct Involvement: Absent Variant biliary anatomy: Not appreciated Adjacent Nodes: Portacaval node 0.8 cm in  short axis on image 30/4, and somewhat indistinctly marginated. Peripancreatic node 0.6 cm in short axis on image 28/4. Omental/Peritoneal Disease: Absent Distant Metastases: Absent Spleen: Unremarkable Adrenals/Urinary Tract: 2.6 by 2.8 cm enhancing exophytic mass of the left mid kidney laterally within anterior cystic component but primarily solid, most compatible with a renal cell carcinoma. The adrenal glands appear normal. Suspected urethral diverticulum or periurethral cyst somewhat eccentric to the right. Small bladder cellule or diverticulum on the left. Stomach/Bowel: Wall thickening in the stomach antrum is most likely physiologic or due to contraction. Fatty prominence of the ileocecal valve.  Descending and sigmoid colon diverticulosis. Upper normal diameter of the appendix at 7 mm. Vascular/Lymphatic: No tumor thrombus in the left renal vein. The pancreatic mass abuts the SMA, SMV, and left renal vein as noted above in the pancreas section. Small peripancreatic/porta hepatis lymph nodes as noted above. No overt pathologic periaortic adenopathy at this time. Reproductive: Unremarkable Other: Trace free pelvic fluid. Musculoskeletal: Umbilical hernia contains adipose tissue. IMPRESSION: 1. Hypoenhancing 3.0 cm mass of the uncinate process of the pancreas. The lesion abuts the SMV, SMA, and left renal vein without true encasement at this time. Appearance suspicious for pancreatic adenocarcinoma, focal pancreatitis considered a less likely differential diagnostic consideration. Metastatic disease from the left renal mass is likewise considered less likely given the difference in overall characteristics between the pancreatic and renal lesions. Further characteristics of the pancreatic tumor are enumerated above. 2. There is also a 2.8 cm enhancing exophytic mass of the left mid kidney compatible with renal cell carcinoma. No tumor thrombus in the left renal vein or adjacent periaortic adenopathy. 3. No evidence of hepatic or other metastatic lesions. 4. Other imaging findings of potential clinical significance: Diffuse hepatic steatosis. Descending and sigmoid colon diverticulosis. Trace free pelvic fluid. Umbilical hernia contains adipose tissue. Electronically Signed   By: Van Clines M.D.   On: 10/17/2017 10:27   Nm Pet Image Initial (pi) Skull Base To Thigh  Result Date: 10/30/2017 CLINICAL DATA:  Initial treatment strategy for pancreatic adenocarcinoma. EXAM: NUCLEAR MEDICINE PET SKULL BASE TO THIGH TECHNIQUE: 9.42 mCi F-18 FDG was injected intravenously. Full-ring PET imaging was performed from the skull base to thigh after the radiotracer. CT data was obtained and used for attenuation  correction and anatomic localization. Fasting blood glucose: 119 mg/dl COMPARISON:  CT scan 10/17/2017 FINDINGS: Mediastinal blood pool activity: SUV max 2.99 NECK: No hypermetabolic lymph nodes in the neck. Incidental CT findings: none CHEST: No hypermetabolic mediastinal or hilar nodes. No suspicious pulmonary nodules on the CT scan. Incidental CT findings: No breast masses, supraclavicular or axillary adenopathy. ABDOMEN/PELVIS: The 3.0 x 3.0 cm pancreatic head mass is hypermetabolic with SUV max of 0.17. No adjacent lymphadenopathy. No findings for hepatic metastatic disease. The adrenal glands are unremarkable. Marked hypermetabolism noted in the right periurethral region. 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 It is better demonstrated on the prior CT scan. Anal activity may be related to internal hemorrhoids. Recommend correlation with physical examination. Incidental CT findings: Status post cholecystectomy. No biliary dilatation. Remarkably no atherosclerotic calcifications involving the aorta and iliac arteries. Periumbilical anterior abdominal wall hernia containing fat. Sigmoid colon diverticulosis without findings for acute diverticulitis. SKELETON: No focal hypermetabolic activity to suggest skeletal metastasis. Incidental CT findings: none IMPRESSION: 1. 3 cm pancreatic head mass is hypermetabolic and consistent with known pancreatic adenocarcinoma. No surrounding adenopathy or evidence of hepatic metastatic disease. 2. No findings for metastatic disease involving the chest  or bony structures. 3. 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 Electronically Signed   By: Marijo Sanes M.D.   On: 10/30/2017 16:51   Ct Renal Stone Study  Result Date: 10/17/2017 CLINICAL DATA:  Mid to lower back pain on the left for a few days EXAM: CT ABDOMEN AND PELVIS WITHOUT CONTRAST TECHNIQUE: Multidetector CT  imaging of the abdomen and pelvis was performed following the standard protocol without IV contrast. COMPARISON:  None. FINDINGS: Lower chest: Ground-glass density in the right lower lobe is attributed to scarring from bulky osteophytes. Hepatobiliary: Hepatic steatosis.Cholecystectomy with normal common bile duct diameter. Pancreas: Abnormal fullness at the level of the uncinate process with haziness of surrounding fat. Soft tissue indistinguishable from the proximal SMA. No ductal dilatation or evident collection. Spleen: Unremarkable. Adrenals/Urinary Tract: Negative adrenals. No hydronephrosis or stone. 2.7 cm left renal lesion measuring cystic density. Unremarkable bladder. There is a C-shaped low-density right of the urethra measuring 19 mm in length by 8 mm in thickness, most likely a urethral diverticulum. Stomach/Bowel: Extensive colonic diverticulosis distally. No obstruction . No appendicitis Vascular/Lymphatic: No acute vascular abnormality. No mass or adenopathy. Reproductive:No pathologic findings. Other: No ascites or pneumoperitoneum.  Fatty umbilical hernia. Musculoskeletal: No acute abnormalities. Spondylosis and disc degeneration. These results were called by telephone at the time of interpretation on 10/17/2017 at 8:55 am to Dr. Lenise Arena , who verbally acknowledged these results. IMPRESSION: 1. Mass versus focal pancreatitis at the uncinate process, appearance more concerning for carcinoma than inflammation. Please correlate with serum enzymes and obtained postcontrast pancreas protocol CT. 2. Urethral diverticulum. 3. Hepatic steatosis. 4. Colonic diverticulosis. 5. Fatty supra umbilical hernia. Electronically Signed   By: Monte Fantasia M.D.   On: 10/17/2017 08:58    ASSESSMENT: Clinical stage Ib pancreatic adenocarcinoma.  PLAN:    1.  Clinical stage Ib pancreatic adenocarcinoma: EUS completed on October 25, 2017 confirmed adenocarcinoma. Patient CA-19-9 is mildly elevated at  78.  PET scan results reviewed independently and report as above with lesion confined to the pancreas.  After review of cancer conference and with radiology, it appears the lesion is abutting the SMA, therefore patient will likely require neoadjuvant chemotherapy.  She has consultation with GI surgery in the next week for further evaluation and additional treatment planning.  Follow-up in 1 to 2 weeks after her surgical evaluation. 2.  Intractable abdominal pain: Improving.  Patient is given a prescription for fentanyl patch 25 mcg every 3 days.  She is also been instructed to continue oxycodone as prescribed. 3.  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  Continue to monitor while pancreatic mass is being worked up.  No intervention is needed.  Patient has follow-up with urology on November 20, 2017.  I spent a total of 30 minutes face-to-face with the patient of which greater than 50% of the visit was spent in counseling and coordination of care as detailed above.   Patient expressed understanding and was in agreement with this plan. She also understands that She can call clinic at any time with any questions, concerns, or complaints.   Cancer Staging Pancreatic adenocarcinoma [facility]) Staging form: Exocrine Pancreas, AJCC 8th Edition - Clinical stage from 10/26/2017: Stage IB (cT2, cN0, cM0) - Signed by Lloyd Huger, MD on 10/26/2017   Lloyd Huger, MD   11/02/2017 5:05 PM

## 2017-10-30 ENCOUNTER — Ambulatory Visit
Admission: RE | Admit: 2017-10-30 | Discharge: 2017-10-30 | Disposition: A | Discharge status: Home / Self Care | Payer: Medicare Other | Source: Ambulatory Visit | Attending: Oncology | Admitting: Oncology

## 2017-10-30 DIAGNOSIS — C259 Malignant neoplasm of pancreas, unspecified: Secondary | ICD-10-CM | POA: Diagnosis not present

## 2017-10-30 LAB — GLUCOSE, CAPILLARY: Glucose-Capillary: 119 mg/dL — ABNORMAL HIGH (ref 70–99)

## 2017-10-30 MED ORDER — FLUDEOXYGLUCOSE F - 18 (FDG) INJECTION
9.4200 | Freq: Once | INTRAVENOUS | Status: AC | PRN
  Administered 2017-10-30: 9.42 via INTRAVENOUS

## 2017-10-31 ENCOUNTER — Inpatient Hospital Stay (HOSPITAL_BASED_OUTPATIENT_CLINIC_OR_DEPARTMENT_OTHER): Payer: Medicare Other | Admitting: Oncology

## 2017-10-31 ENCOUNTER — Telehealth: Payer: Self-pay

## 2017-10-31 ENCOUNTER — Other Ambulatory Visit: Payer: Self-pay | Admitting: *Deleted

## 2017-10-31 ENCOUNTER — Encounter: Payer: Self-pay | Admitting: Oncology

## 2017-10-31 VITALS — BP 133/73 | HR 81 | Temp 97.9°F | Resp 18 | Wt 170.2 lb

## 2017-10-31 DIAGNOSIS — N2889 Other specified disorders of kidney and ureter: Secondary | ICD-10-CM | POA: Diagnosis not present

## 2017-10-31 DIAGNOSIS — C259 Malignant neoplasm of pancreas, unspecified: Secondary | ICD-10-CM

## 2017-10-31 DIAGNOSIS — R109 Unspecified abdominal pain: Secondary | ICD-10-CM | POA: Diagnosis not present

## 2017-10-31 DIAGNOSIS — Z5111 Encounter for antineoplastic chemotherapy: Secondary | ICD-10-CM | POA: Diagnosis not present

## 2017-10-31 DIAGNOSIS — R630 Anorexia: Secondary | ICD-10-CM

## 2017-10-31 MED ORDER — FENTANYL 25 MCG/HR TD PT72: 25.0000 ug | MEDICATED_PATCH | TRANSDERMAL | 0 refills | Status: DC

## 2017-10-31 NOTE — Telephone Encounter (Signed)
Referral sent to [facility]. Appointment confirmed for [facility] ([place]) office, 11-05-17 at 0945 with 0915 check in. Appointment is with Dr. Stark Klein. Directions to [facility] office given to Ms. Belmontes. Oncology Nurse Navigator Documentation  Navigator Location: [facility] (10/31/17 1400)   )Navigator Encounter Type: Follow-up Appt (10/31/17 1400) Telephone: [place] Call (10/31/17 1400)                   Patient Visit Type: MedOnc (10/31/17 1400)   Barriers/Navigation Needs: Coordination of Care (10/31/17 1400)   Interventions: Referrals;Coordination of Care (10/31/17 1400) Referrals: (Surgeon) (10/31/17 1400)                    Time Spent with Patient: 30 (10/31/17 1400)

## 2017-10-31 NOTE — Progress Notes (Signed)
Pt in for follow up, reports not feeling very well.  Poor appetite and pain in mid back area.

## 2017-11-01 ENCOUNTER — Telehealth: Payer: Self-pay | Admitting: *Deleted

## 2017-11-01 ENCOUNTER — Encounter: Payer: Self-pay | Admitting: Oncology

## 2017-11-01 ENCOUNTER — Other Ambulatory Visit: Payer: Self-pay | Admitting: Oncology

## 2017-11-01 MED ORDER — FENTANYL 25 MCG/HR TD PT72: 25.0000 ug | MEDICATED_PATCH | TRANSDERMAL | 0 refills | Status: DC

## 2017-11-01 NOTE — Telephone Encounter (Signed)
Fentanyl patches sent to patients pharmacy yesterday, required prior authorization. PA has been submitted, awaiting determination from insurance company.

## 2017-11-01 NOTE — Telephone Encounter (Signed)
Called patient to advise her to check with pharmacy for authorization.

## 2017-11-01 NOTE — Progress Notes (Signed)
[facility] [place] [facility] not [facility] 25 mcg that was prescribed and may not haveit until Monday.  I have prescribed 5 patches of [facility] [address]. Please calculate this for future refills  Dr. Randa Evens, MD, MPH [facility] [phone] 11/01/2017 6:23 PM

## 2017-11-01 NOTE — Telephone Encounter (Signed)
Patient called requesting medication refill for fentaNYL patch.

## 2017-11-02 ENCOUNTER — Telehealth: Payer: Self-pay

## 2017-11-02 NOTE — Telephone Encounter (Signed)
Called and spoke to Cindy Bowman regarding genetic testing for her newly diagnosed pancreatic cancer. We would like for her to see our genetic counselor. Briefly described what the consult would consist of and reasons for seeing counselor. At this time she would like to think about it. She voiced that her diagnosis is still sinking in.  I did let her know that we have some openings to see the counselor on 9-19 and to call me if she would like for me to arrange. Oncology Nurse Navigator Documentation  Navigator Location: [facility] (11/02/17 0900)   )Navigator Encounter Type: Telephone (11/02/17 0900) Telephone: Lahoma Crocker Call (11/02/17 0900)                       Barriers/Navigation Needs: Coordination of Care (11/02/17 0900)   Interventions: Referrals (11/02/17 0900) Referrals: Genetics (11/02/17 0900)                    Time Spent with Patient: 15 (11/02/17 0900)

## 2017-11-05 ENCOUNTER — Other Ambulatory Visit: Payer: Self-pay | Admitting: General Surgery

## 2017-11-06 ENCOUNTER — Inpatient Hospital Stay: Payer: Medicare Other | Admitting: Oncology

## 2017-11-06 ENCOUNTER — Inpatient Hospital Stay: Payer: Medicare Other

## 2017-11-06 ENCOUNTER — Other Ambulatory Visit: Payer: Self-pay | Admitting: *Deleted

## 2017-11-06 ENCOUNTER — Encounter (INDEPENDENT_AMBULATORY_CARE_PROVIDER_SITE_OTHER): Payer: Self-pay

## 2017-11-06 ENCOUNTER — Other Ambulatory Visit: Payer: Self-pay

## 2017-11-06 ENCOUNTER — Encounter: Payer: Self-pay | Admitting: Oncology

## 2017-11-06 VITALS — BP 119/71 | HR 88 | Temp 97.4°F | Resp 18 | Wt 179.0 lb

## 2017-11-06 DIAGNOSIS — Z5111 Encounter for antineoplastic chemotherapy: Secondary | ICD-10-CM | POA: Diagnosis not present

## 2017-11-06 DIAGNOSIS — N2889 Other specified disorders of kidney and ureter: Secondary | ICD-10-CM

## 2017-11-06 DIAGNOSIS — C259 Malignant neoplasm of pancreas, unspecified: Secondary | ICD-10-CM

## 2017-11-06 DIAGNOSIS — R109 Unspecified abdominal pain: Secondary | ICD-10-CM

## 2017-11-06 DIAGNOSIS — R970 Elevated carcinoembryonic antigen [CEA]: Secondary | ICD-10-CM

## 2017-11-06 MED ORDER — ONDANSETRON HCL 8 MG PO TABS: 8.0000 mg | ORAL_TABLET | Freq: Two times a day (BID) | ORAL | 2 refills | Status: DC | PRN

## 2017-11-06 MED ORDER — LIDOCAINE-PRILOCAINE 2.5-2.5 % EX CREA: TOPICAL_CREAM | CUTANEOUS | 3 refills | Status: DC

## 2017-11-06 MED ORDER — PROCHLORPERAZINE MALEATE 10 MG PO TABS: 10.0000 mg | ORAL_TABLET | Freq: Four times a day (QID) | ORAL | 2 refills | Status: DC | PRN

## 2017-11-06 MED ORDER — LOPERAMIDE HCL 2 MG PO TABS: 2.0000 mg | ORAL_TABLET | Freq: Four times a day (QID) | ORAL | 1 refills | Status: DC | PRN

## 2017-11-06 NOTE — Progress Notes (Signed)
START ON PATHWAY REGIMEN - Pancreatic     A cycle is every 14 days:     Oxaliplatin      Leucovorin      Irinotecan      5-Fluorouracil      5-Fluorouracil   **Always confirm dose/schedule in your pharmacy ordering system**  Patient Characteristics: Adenocarcinoma, Borderline Resectable or High Risk, Potentially Resectable, Primary Neoadjuvant Therapy, PS = 0, 1 Histology: Adenocarcinoma Current evidence of distant metastases<= No AJCC T Category: T2 AJCC N Category: N0 AJCC M Category: M0 AJCC 8 Stage Grouping: IB Intent of Therapy: Curative Intent, Discussed with Patient 

## 2017-11-06 NOTE — Progress Notes (Signed)
[place]  [phone] [phone]) [phone]  ID: Cindy Bowman OB: 1950/07/14  MR#: [id number]  IEP#:[id number]  Patient Care Team: Glendon Axe, MD as PCP - General (Internal Medicine) Clent Jacks, RN as Registered Nurse  CHIEF COMPLAINT: Clinical stage Ib pancreatic adenocarcinoma.  INTERVAL HISTORY: Patient returns to clinic today for further evaluation and treatment planning.  She had consultation with surgery recently who agreed to pursue neoadjuvant chemotherapy.  Patient's pain is much better controlled on her current narcotic regimen.  She continues to be highly anxious. She has no neurologic complaints.  She denies any recent fevers or illnesses.    She has a poor appetite, but denies weight loss.  She denies any chest pain or shortness of breath.  She has no nausea, vomiting, constipation, or diarrhea.  She has no urinary complaints.  Patient offers no further specific complaints today.  REVIEW OF SYSTEMS:   Review of Systems  Constitutional: Negative.  Negative for fever, malaise/fatigue and weight loss.  Respiratory: Negative.  Negative for cough and shortness of breath.   Cardiovascular: Negative.  Negative for chest pain and leg swelling.  Gastrointestinal: Positive for abdominal pain. Negative for blood in stool, melena, nausea and vomiting.  Genitourinary: Negative.  Negative for dysuria.  Musculoskeletal: Negative.  Negative for back pain.  Skin: Negative.  Negative for rash.  Neurological: Negative.  Negative for dizziness, focal weakness, weakness and headaches.  Psychiatric/Behavioral: The patient is nervous/anxious.     As per HPI. Otherwise, a complete review of systems is negative.  PAST MEDICAL HISTORY: Past Medical History:  Diagnosis Date  . Essential hypertension   . UTI (urinary tract infection)     PAST SURGICAL HISTORY: Past Surgical History:  Procedure Laterality Date  . CESAREAN SECTION    . CHOLECYSTECTOMY      . EUS N/A 10/25/2017   Procedure: FULL UPPER ENDOSCOPIC ULTRASOUND (EUS) RADIAL;  Surgeon: Jola Schmidt, MD;  Location: [facility] ENDOSCOPY;  Service: Endoscopy;  Laterality: N/A;  . OOPHORECTOMY    . TUBAL LIGATION      FAMILY HISTORY: Family History  Problem Relation Age of Onset  . Hypertension Mother   . Heart attack Father     ADVANCED DIRECTIVES (Y/N):  N  HEALTH MAINTENANCE: Social History   Tobacco Use  . Smoking status: Never Smoker  . Smokeless tobacco: Never Used  Substance Use Topics  . Alcohol use: Not Currently  . Drug use: Never     Colonoscopy:  PAP:  Bone density:  Lipid panel:  Allergies  Allergen Reactions  . Nsaids Other (See Comments)    Decreased GFR  . Ibuprofen     Lowers kidney function  . Penicillins     Yeast infection  Has patient had a PCN reaction causing immediate rash, facial/tongue/throat swelling, SOB or lightheadedness with hypotension: No Has patient had a PCN reaction causing severe rash involving mucus membranes or skin necrosis: No Has patient had a PCN reaction that required hospitalization: No Has patient had a PCN reaction occurring within the last 10 years: No If all of the above answers are "NO", then may proceed with Cephalosporin use.     Current Outpatient Medications  Medication Sig Dispense Refill  . fentaNYL (DURAGESIC - DOSED MCG/HR) 25 MCG/HR patch Place 1 patch (25 mcg total) onto the skin every 3 (three) days. 10 patch 0  . losartan-hydrochlorothiazide (HYZAAR) 50-12.5 MG tablet Take 1 tablet by mouth daily.  1  . Oxycodone HCl 10 MG TABS  Take 1 tablet (10 mg total) by mouth every 6 (six) hours as needed. 40 tablet 0  . docusate sodium (COLACE) 100 MG capsule Take 2 capsules (200 mg total) by mouth 2 (two) times daily. (Patient not taking: Reported on 11/06/2017) 30 capsule 0  . lidocaine-prilocaine (EMLA) cream Apply to affected area once 30 g 3  . loperamide (IMODIUM A-D) 2 MG tablet Take 1 tablet (2 mg total) by  mouth 4 (four) times daily as needed. Take 2 at diarrhea onset , then 1 every 2hr until 12hrs with no BM. May take 2 every 4hrs at night. If diarrhea recurs repeat. 100 tablet 1  . ondansetron (ZOFRAN ODT) 4 MG disintegrating tablet Take 1 tablet (4 mg total) by mouth every 8 (eight) hours as needed for nausea or vomiting. (Patient not taking: Reported on 11/06/2017) 20 tablet 0  . ondansetron (ZOFRAN) 8 MG tablet Take 1 tablet (8 mg total) by mouth 2 (two) times daily as needed for refractory nausea / vomiting. 30 tablet 2  . prochlorperazine (COMPAZINE) 10 MG tablet Take 1 tablet (10 mg total) by mouth every 6 (six) hours as needed (NAUSEA). 60 tablet 2   No current facility-administered medications for this visit.     OBJECTIVE: Vitals:   11/06/17 0923 11/06/17 0925  BP:  119/71  Pulse:  88  Resp: 18   Temp: (!) 97.4 F (36.3 C)      Body mass index is 31.71 kg/m.    ECOG FS:0 - Asymptomatic  General: Well-developed, well-nourished, no acute distress. Eyes: Pink conjunctiva, anicteric sclera. HEENT: Normocephalic, moist mucous membranes. Lungs: Clear to auscultation bilaterally. Heart: Regular rate and rhythm. No rubs, murmurs, or gallops. Abdomen: Soft, nontender, nondistended. No organomegaly noted, normoactive bowel sounds. Musculoskeletal: No edema, cyanosis, or clubbing. Neuro: Alert, answering all questions appropriately. Cranial nerves grossly intact. Skin: No rashes or petechiae noted. Psych: Normal affect.  LAB RESULTS:  Lab Results  Component Value Date   NA 142 10/19/2017   K 4.3 10/19/2017   CL 108 10/19/2017   CO2 26 10/19/2017   GLUCOSE 103 (H) 10/19/2017   BUN 17 10/19/2017   CREATININE 1.22 (H) 10/19/2017   CALCIUM 8.7 (L) 10/19/2017   PROT 8.3 (H) 10/18/2017   ALBUMIN 4.1 10/18/2017   AST 30 10/18/2017   ALT 20 10/18/2017   ALKPHOS 115 10/18/2017   BILITOT 0.8 10/18/2017   GFRNONAA 44 (L) 10/19/2017   GFRAA 52 (L) 10/19/2017    Lab Results    Component Value Date   WBC 11.0 10/19/2017   NEUTROABS 9.7 (H) 10/18/2017   HGB 11.2 (L) 10/19/2017   HCT 32.9 (L) 10/19/2017   MCV 94.1 10/19/2017   PLT 287 10/19/2017     STUDIES: Ct Abdomen Pelvis W Contrast  Result Date: 10/17/2017 CLINICAL DATA:  Pancreatic lesion on earlier noncontrast CT, for further assessment. EXAM: CT ABDOMEN AND PELVIS WITH CONTRAST TECHNIQUE: Multidetector CT imaging of the abdomen and pelvis was performed using the standard protocol following bolus administration of intravenous contrast. CONTRAST:  115mL ISOVUE-300 IOPAMIDOL (ISOVUE-300) INJECTION 61% COMPARISON:  10/17/2017 FINDINGS: Lower chest: Unremarkable Hepatobiliary: Diffuse hepatic steatosis. Cholecystectomy. No appreciable focal hepatic mass. Pancreas: There is a hypodense mass in the uncinate process of the pancreas suspicious for pancreatic adenocarcinoma. Size: 3.0 by 2.8 by 2.4 cm Location: Uncinate Characterization: Solid Enhancement: Hypoenhancing Other Characteristics: No calcification. Mildly ill-defined margins. Local extent of mass: No definite duodenal invasion. Vascular Involvement: The mass abuts the posterior margin of the  superior mesenteric artery and also abuts the posterior margin of the superior mesenteric vein. No overt encasement of either of the structures. The posterior margin of the mass slightly abuts the left renal vein. Variant hepatic artery anatomy: Conventional Bile Duct Involvement: Absent Variant biliary anatomy: Not appreciated Adjacent Nodes: Portacaval node 0.8 cm in short axis on image 30/4, and somewhat indistinctly marginated. Peripancreatic node 0.6 cm in short axis on image 28/4. Omental/Peritoneal Disease: Absent Distant Metastases: Absent Spleen: Unremarkable Adrenals/Urinary Tract: 2.6 by 2.8 cm enhancing exophytic mass of the left mid kidney laterally within anterior cystic component but primarily solid, most compatible with a renal cell carcinoma. The adrenal glands  appear normal. Suspected urethral diverticulum or periurethral cyst somewhat eccentric to the right. Small bladder cellule or diverticulum on the left. Stomach/Bowel: Wall thickening in the stomach antrum is most likely physiologic or due to contraction. Fatty prominence of the ileocecal valve. Descending and sigmoid colon diverticulosis. Upper normal diameter of the appendix at 7 mm. Vascular/Lymphatic: No tumor thrombus in the left renal vein. The pancreatic mass abuts the SMA, SMV, and left renal vein as noted above in the pancreas section. Small peripancreatic/porta hepatis lymph nodes as noted above. No overt pathologic periaortic adenopathy at this time. Reproductive: Unremarkable Other: Trace free pelvic fluid. Musculoskeletal: Umbilical hernia contains adipose tissue. IMPRESSION: 1. Hypoenhancing 3.0 cm mass of the uncinate process of the pancreas. The lesion abuts the SMV, SMA, and left renal vein without true encasement at this time. Appearance suspicious for pancreatic adenocarcinoma, focal pancreatitis considered a less likely differential diagnostic consideration. Metastatic disease from the left renal mass is likewise considered less likely given the difference in overall characteristics between the pancreatic and renal lesions. Further characteristics of the pancreatic tumor are enumerated above. 2. There is also a 2.8 cm enhancing exophytic mass of the left mid kidney compatible with renal cell carcinoma. No tumor thrombus in the left renal vein or adjacent periaortic adenopathy. 3. No evidence of hepatic or other metastatic lesions. 4. Other imaging findings of potential clinical significance: Diffuse hepatic steatosis. Descending and sigmoid colon diverticulosis. Trace free pelvic fluid. Umbilical hernia contains adipose tissue. Electronically Signed   By: Van Clines M.D.   On: 10/17/2017 10:27   Nm Pet Image Initial (pi) Skull Base To Thigh  Result Date: 10/30/2017 CLINICAL DATA:   Initial treatment strategy for pancreatic adenocarcinoma. EXAM: NUCLEAR MEDICINE PET SKULL BASE TO THIGH TECHNIQUE: 9.42 mCi F-18 FDG was injected intravenously. Full-ring PET imaging was performed from the skull base to thigh after the radiotracer. CT data was obtained and used for attenuation correction and anatomic localization. Fasting blood glucose: 119 mg/dl COMPARISON:  CT scan 10/17/2017 FINDINGS: Mediastinal blood pool activity: SUV max 2.99 NECK: No hypermetabolic lymph nodes in the neck. Incidental CT findings: none CHEST: No hypermetabolic mediastinal or hilar nodes. No suspicious pulmonary nodules on the CT scan. Incidental CT findings: No breast masses, supraclavicular or axillary adenopathy. ABDOMEN/PELVIS: The 3.0 x 3.0 cm pancreatic head mass is hypermetabolic with SUV max of 2.22. No adjacent lymphadenopathy. No findings for hepatic metastatic disease. The adrenal glands are unremarkable. Marked hypermetabolism noted in the right periurethral region. 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 It is better demonstrated on the prior CT scan. Anal activity may be related to internal hemorrhoids. Recommend correlation with physical examination. Incidental CT findings: Status post cholecystectomy. No biliary dilatation. Remarkably no atherosclerotic calcifications involving the aorta and iliac arteries. Periumbilical anterior abdominal wall hernia containing  fat. Sigmoid colon diverticulosis without findings for acute diverticulitis. SKELETON: No focal hypermetabolic activity to suggest skeletal metastasis. Incidental CT findings: none IMPRESSION: 1. 3 cm pancreatic head mass is hypermetabolic and consistent with known pancreatic adenocarcinoma. No surrounding adenopathy or evidence of hepatic metastatic disease. 2. No findings for metastatic disease involving the chest or bony structures. 3. Hypermetabolism noted in the right periurethral region most likely due to a urethral  diverticulum containing urine. Electronically Signed   By: Marijo Sanes M.D.   On: 10/30/2017 16:51   Ct Renal Stone Study  Result Date: 10/17/2017 CLINICAL DATA:  Mid to lower back pain on the left for a few days EXAM: CT ABDOMEN AND PELVIS WITHOUT CONTRAST TECHNIQUE: Multidetector CT imaging of the abdomen and pelvis was performed following the standard protocol without IV contrast. COMPARISON:  None. FINDINGS: Lower chest: Ground-glass density in the right lower lobe is attributed to scarring from bulky osteophytes. Hepatobiliary: Hepatic steatosis.Cholecystectomy with normal common bile duct diameter. Pancreas: Abnormal fullness at the level of the uncinate process with haziness of surrounding fat. Soft tissue indistinguishable from the proximal SMA. No ductal dilatation or evident collection. Spleen: Unremarkable. Adrenals/Urinary Tract: Negative adrenals. No hydronephrosis or stone. 2.7 cm left renal lesion measuring cystic density. Unremarkable bladder. There is a C-shaped low-density right of the urethra measuring 19 mm in length by 8 mm in thickness, most likely a urethral diverticulum. Stomach/Bowel: Extensive colonic diverticulosis distally. No obstruction . No appendicitis Vascular/Lymphatic: No acute vascular abnormality. No mass or adenopathy. Reproductive:No pathologic findings. Other: No ascites or pneumoperitoneum.  Fatty umbilical hernia. Musculoskeletal: No acute abnormalities. Spondylosis and disc degeneration. These results were called by telephone at the time of interpretation on 10/17/2017 at 8:55 am to Dr. Lenise Arena , who verbally acknowledged these results. IMPRESSION: 1. Mass versus focal pancreatitis at the uncinate process, appearance more concerning for carcinoma than inflammation. Please correlate with serum enzymes and obtained postcontrast pancreas protocol CT. 2. Urethral diverticulum. 3. Hepatic steatosis. 4. Colonic diverticulosis. 5. Fatty supra umbilical hernia.  Electronically Signed   By: Monte Fantasia M.D.   On: 10/17/2017 08:58    ASSESSMENT: Clinical stage Ib pancreatic adenocarcinoma.  PLAN:    1.  Clinical stage Ib pancreatic adenocarcinoma: EUS completed on October 25, 2017 confirmed adenocarcinoma. Patient CA-19-9 is mildly elevated at 78.  PET scan results reviewed independently and report as above with lesion confined to the pancreas.  After review of cancer conference and with radiology, it appears the lesion is abutting the SMA, therefore patient will require neoadjuvant chemotherapy.  She was evaluated by surgery and is considered borderline resectable.  Patient will have port placement later this week.  Return to clinic on November 14, 2017 for further evaluation and initiation of cycle 1 of 4 of neoadjuvant FOLFIRINOX.  Patient also may receive stereotactic XRT prior to surgery. 2.  Pain: Well controlled.  Continue fentanyl patch and oxycodone as needed. 3.  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  Continue to monitor while pancreatic mass is being worked up.  No intervention is needed.  Patient has follow-up with urology on November 20, 2017. 4.  Genetic: Patient was given to referral for genetics for further evaluation and counseling.  I spent a total of 30 minutes face-to-face with the patient of which greater than 50% of the visit was spent in counseling and coordination of care as detailed above.    Patient expressed understanding and was in agreement with this plan. She also understands  that She can call clinic at any time with any questions, concerns, or complaints.   Cancer Staging Pancreatic adenocarcinoma [facility]) Staging form: Exocrine Pancreas, AJCC 8th Edition - Clinical stage from 10/26/2017: Stage IB (cT2, cN0, cM0) - Signed by Lloyd Huger, MD on 10/26/2017   Lloyd Huger, MD   11/06/2017 1:05 PM

## 2017-11-06 NOTE — Progress Notes (Signed)
Patient here today to discuss treatment planning.

## 2017-11-07 ENCOUNTER — Telehealth: Payer: Self-pay

## 2017-11-07 ENCOUNTER — Other Ambulatory Visit: Payer: Self-pay | Admitting: *Deleted

## 2017-11-07 MED ORDER — FENTANYL 25 MCG/HR TD PT72: 25.0000 ug | MEDICATED_PATCH | TRANSDERMAL | 0 refills | Status: DC

## 2017-11-07 MED ORDER — OXYCODONE HCL 10 MG PO TABS: 10.0000 mg | ORAL_TABLET | Freq: Four times a day (QID) | ORAL | 0 refills | Status: DC | PRN

## 2017-11-07 NOTE — Telephone Encounter (Signed)
----- Message from Irving Copas., MD sent at 11/07/2017  1:45 PM EDT ----- Regarding: RE: pancreatic cancer patient.  Dorris Fetch, I hope you are well.  I just learned that we have received the fiducials and should be able to start using them within the next week once we have our charges up.  Linna Hoff is going to be with me for the first few that we do so that he can get up to speed on these as well so that we can offer this most days of the week if needed.   I spoke with Linna Hoff about his own protocol of how he was doing neuro lysis vs neural block.  I am awaiting my colleagues back at [facility] sending me what we used to use so that I can have that for my records and we can hopefully put together a protocol that Linna Hoff and I can use moving forward for our patients. I am actually in the hospital all next week and I do not have any outpatient time available in the hospital the next week after.  However, if we can set up some EUS time next Wednesday or Thursday (while in the hospital,  we could try and add her on to do the procedures.  When were you all thinking of starting her on therapy and when which she be simulated for radiation?  Chong Sicilian can you start looking at times thoughts for either the 25th or the 26th and if we have availability and can block time that would be great ideally earlier in the day if possible. Thanks. Gabe ----- Message ----- From: Irving Copas., MD Sent: 11/06/2017   3:25 PM EDT To: Stark Klein, MD, Irving Copas., MD Subject: RE: pancreatic cancer patient.                 Dorris Fetch, I looked at [facility] EUS photographs. I think it is in a region that could be possible for placement of a Fiducial but we don't always know until we get down there, but if a FNB was possible, and position is OK, we should be able to get it done. I need to check with our group as to when we will get the Fiducials as we are awaiting the OK and the ordering process. In regards to the question  of a Celiac block/neurolysis, I am not sure if we have a protocol and the materials to do this.  I have sent an email to Oretha Caprice about this to see if we do have a protocol. Once I have the answer to both of these questions I can let you know, hopefully in the next 24-48 hours. Thanks. Gabe  ----- Message ----- From: Stark Klein, MD Sent: 11/06/2017  12:36 PM EDT To: Irving Copas., MD Subject: pancreatic cancer patient.                     This lady is a newly dx pancreatic cancer patient from [place].  She had EUS, but has borderline (probably not) resectable cancer.  We are doing neoadjuvant chemo, then most likely stereotactic radiation, though she hasn't seen them yet.  Can you look at scans and see if you could put a fiducial in?  Also, she is having intractable back pain.  Would you consider celiac block at the same time?  tx FB  ----- Message ----- From: Lloyd Huger, MD Sent: 11/05/2017  11:45 AM EDT To: Stark Klein, MD, Clent Jacks, RN  Thank you Dorris Fetch.  With the stereotactic XRT, do you have a particular concurrent chemo regimen that is used?  I am ok with both referrals and I'll take care of any chemo needed.   A Port would be great. Thanks!  -Tim  ----- Message ----- From: Stark Klein, MD Sent: 11/05/2017   9:50 AM EDT To: Lloyd Huger, MD, Clent Jacks, RN  Dr. Grayland Ormond and Steffanie Dunn,  Thanks for referral.  I agree with neoadjuvant chemo on this lady.  With these with possible vascular involvement, we have also been doing neoadjuvant stereotactic radiation as well.  The thing that has held [place] up with timing of those is getting a fiducial marker placed in the mass.  We have a new guy that does that here with [facility].  Are you OK with me going ahead and making that referral?  Before, we had to send them to [facility], and I am imagining that you guys would have had to send them out to [facility] or [facility].    Are you arranging for port  at [facility], or would you like me to place it?  Thanks Stark [facility].

## 2017-11-07 NOTE — Patient Instructions (Signed)
Irinotecan injection What is this medicine? IRINOTECAN (ir in oh TEE kan ) is a chemotherapy drug. It is used to treat colon and rectal cancer. This medicine may be used for other purposes; ask your health care provider or pharmacist if you have questions. COMMON BRAND NAME(S): Camptosar What should I tell my health care provider before I take this medicine? They need to know if you have any of these conditions: -blood disorders -dehydration -diarrhea -infection (especially a virus infection such as chickenpox, cold sores, or herpes) -liver disease -low blood counts, like low white cell, platelet, or red cell counts -recent or ongoing radiation therapy -an unusual or allergic reaction to irinotecan, sorbitol, other chemotherapy, other medicines, foods, dyes, or preservatives -pregnant or trying to get pregnant -breast-feeding How should I use this medicine? This drug is given as an infusion into a vein. It is administered in a hospital or clinic by a specially trained health care professional. Talk to your pediatrician regarding the use of this medicine in children. Special care may be needed. Overdosage: If you think you have taken too much of this medicine contact a poison control center or emergency room at once. NOTE: This medicine is only for you. Do not share this medicine with others. What if I miss a dose? It is important not to miss your dose. Call your doctor or health care professional if you are unable to keep an appointment. What may interact with this medicine? Do not take this medicine with any of the following medications: -atazanavir -certain medicines for fungal infections like itraconazole and ketoconazole -St. John's Wort This medicine may also interact with the following medications: -dexamethasone -diuretics -laxatives -medicines for seizures like carbamazepine, mephobarbital, phenobarbital, phenytoin, primidone -medicines to increase blood counts like  filgrastim, pegfilgrastim, sargramostim -prochlorperazine -vaccines This list may not describe all possible interactions. Give your health care provider a list of all the medicines, herbs, non-prescription drugs, or dietary supplements you use. Also tell them if you smoke, drink alcohol, or use illegal drugs. Some items may interact with your medicine. What should I watch for while using this medicine? Your condition will be monitored carefully while you are receiving this medicine. You will need important blood work done while you are taking this medicine. This drug may make you feel generally unwell. This is not uncommon, as chemotherapy can affect healthy cells as well as cancer cells. Report any side effects. Continue your course of treatment even though you feel ill unless your doctor tells you to stop. In some cases, you may be given additional medicines to help with side effects. Follow all directions for their use. You may get drowsy or dizzy. Do not drive, use machinery, or do anything that needs mental alertness until you know how this medicine affects you. Do not stand or sit up quickly, especially if you are an older patient. This reduces the risk of dizzy or fainting spells. Call your doctor or health care professional for advice if you get a fever, chills or sore throat, or other symptoms of a cold or flu. Do not treat yourself. This drug decreases your body's ability to fight infections. Try to avoid being around people who are sick. This medicine may increase your risk to bruise or bleed. Call your doctor or health care professional if you notice any unusual bleeding. Be careful brushing and flossing your teeth or using a toothpick because you may get an infection or bleed more easily. If you have any dental work done, tell  your dentist you are receiving this medicine. Avoid taking products that contain aspirin, acetaminophen, ibuprofen, naproxen, or ketoprofen unless instructed by your  doctor. These medicines may hide a fever. Do not become pregnant while taking this medicine. Women should inform their doctor if they wish to become pregnant or think they might be pregnant. There is a potential for serious side effects to an unborn child. Talk to your health care professional or pharmacist for more information. Do not breast-feed an infant while taking this medicine. What side effects may I notice from receiving this medicine? Side effects that you should report to your doctor or health care professional as soon as possible: -allergic reactions like skin rash, itching or hives, swelling of the face, lips, or tongue -low blood counts - this medicine may decrease the number of white blood cells, red blood cells and platelets. You may be at increased risk for infections and bleeding. -signs of infection - fever or chills, cough, sore throat, pain or difficulty passing urine -signs of decreased platelets or bleeding - bruising, pinpoint red spots on the skin, black, tarry stools, blood in the urine -signs of decreased red blood cells - unusually weak or tired, fainting spells, lightheadedness -breathing problems -chest pain -diarrhea -feeling faint or lightheaded, falls -flushing, runny nose, sweating during infusion -mouth sores or pain -pain, swelling, redness or irritation where injected -pain, swelling, warmth in the leg -pain, tingling, numbness in the hands or feet -problems with balance, talking, walking -stomach cramps, pain -trouble passing urine or change in the amount of urine -vomiting as to be unable to hold down drinks or food -yellowing of the eyes or skin Side effects that usually do not require medical attention (report to your doctor or health care professional if they continue or are bothersome): -constipation -hair loss -headache -loss of appetite -nausea, vomiting -stomach upset This list may not describe all possible side effects. Call your doctor for  medical advice about side effects. You may report side effects to FDA at 1-800-FDA-1088. Where should I keep my medicine? This drug is given in a hospital or clinic and will not be stored at home. NOTE: This sheet is a summary. It may not cover all possible information. If you have questions about this medicine, talk to your doctor, pharmacist, or health care provider.  2018 [employer] Standard (2012-08-05 16:29:32) Oxaliplatin Injection What is this medicine? OXALIPLATIN (ox AL i PLA tin) is a chemotherapy drug. It targets fast dividing cells, like cancer cells, and causes these cells to die. This medicine is used to treat cancers of the colon and rectum, and many other cancers. This medicine may be used for other purposes; ask your health care provider or pharmacist if you have questions. COMMON BRAND NAME(S): Eloxatin What should I tell my health care provider before I take this medicine? They need to know if you have any of these conditions: -kidney disease -an unusual or allergic reaction to oxaliplatin, other chemotherapy, other medicines, foods, dyes, or preservatives -pregnant or trying to get pregnant -breast-feeding How should I use this medicine? This drug is given as an infusion into a vein. It is administered in a hospital or clinic by a specially trained health care professional. Talk to your pediatrician regarding the use of this medicine in children. Special care may be needed. Overdosage: If you think you have taken too much of this medicine contact a poison control center or emergency room at once. NOTE: This medicine is only for you. Do not share  this medicine with others. What if I miss a dose? It is important not to miss a dose. Call your doctor or health care professional if you are unable to keep an appointment. What may interact with this medicine? -medicines to increase blood counts like filgrastim, pegfilgrastim, sargramostim -probenecid -some antibiotics like  amikacin, gentamicin, neomycin, polymyxin B, streptomycin, tobramycin -zalcitabine Talk to your doctor or health care professional before taking any of these medicines: -acetaminophen -aspirin -ibuprofen -ketoprofen -naproxen This list may not describe all possible interactions. Give your health care provider a list of all the medicines, herbs, non-prescription drugs, or dietary supplements you use. Also tell them if you smoke, drink alcohol, or use illegal drugs. Some items may interact with your medicine. What should I watch for while using this medicine? Your condition will be monitored carefully while you are receiving this medicine. You will need important blood work done while you are taking this medicine. This medicine can make you more sensitive to cold. Do not drink cold drinks or use ice. Cover exposed skin before coming in contact with cold temperatures or cold objects. When out in cold weather wear warm clothing and cover your mouth and nose to warm the air that goes into your lungs. Tell your doctor if you get sensitive to the cold. This drug may make you feel generally unwell. This is not uncommon, as chemotherapy can affect healthy cells as well as cancer cells. Report any side effects. Continue your course of treatment even though you feel ill unless your doctor tells you to stop. In some cases, you may be given additional medicines to help with side effects. Follow all directions for their use. Call your doctor or health care professional for advice if you get a fever, chills or sore throat, or other symptoms of a cold or flu. Do not treat yourself. This drug decreases your body's ability to fight infections. Try to avoid being around people who are sick. This medicine may increase your risk to bruise or bleed. Call your doctor or health care professional if you notice any unusual bleeding. Be careful brushing and flossing your teeth or using a toothpick because you may get an  infection or bleed more easily. If you have any dental work done, tell your dentist you are receiving this medicine. Avoid taking products that contain aspirin, acetaminophen, ibuprofen, naproxen, or ketoprofen unless instructed by your doctor. These medicines may hide a fever. Do not become pregnant while taking this medicine. Women should inform their doctor if they wish to become pregnant or think they might be pregnant. There is a potential for serious side effects to an unborn child. Talk to your health care professional or pharmacist for more information. Do not breast-feed an infant while taking this medicine. Call your doctor or health care professional if you get diarrhea. Do not treat yourself. What side effects may I notice from receiving this medicine? Side effects that you should report to your doctor or health care professional as soon as possible: -allergic reactions like skin rash, itching or hives, swelling of the face, lips, or tongue -low blood counts - This drug may decrease the number of white blood cells, red blood cells and platelets. You may be at increased risk for infections and bleeding. -signs of infection - fever or chills, cough, sore throat, pain or difficulty passing urine -signs of decreased platelets or bleeding - bruising, pinpoint red spots on the skin, black, tarry stools, nosebleeds -signs of decreased red blood cells -  unusually weak or tired, fainting spells, lightheadedness -breathing problems -chest pain, pressure -cough -diarrhea -jaw tightness -mouth sores -nausea and vomiting -pain, swelling, redness or irritation at the injection site -pain, tingling, numbness in the hands or feet -problems with balance, talking, walking -redness, blistering, peeling or loosening of the skin, including inside the mouth -trouble passing urine or change in the amount of urine Side effects that usually do not require medical attention (report to your doctor or health  care professional if they continue or are bothersome): -changes in vision -constipation -hair loss -loss of appetite -metallic taste in the mouth or changes in taste -stomach pain This list may not describe all possible side effects. Call your doctor for medical advice about side effects. You may report side effects to FDA at 1-800-FDA-1088. Where should I keep my medicine? This drug is given in a hospital or clinic and will not be stored at home. NOTE: This sheet is a summary. It may not cover all possible information. If you have questions about this medicine, talk to your doctor, pharmacist, or health care provider.  2018 [employer] Standard (2007-09-03 17:22:47) Fluorouracil, 5-FU injection What is this medicine? FLUOROURACIL, 5-FU (flure oh YOOR a sil) is a chemotherapy drug. It slows the growth of cancer cells. This medicine is used to treat many types of cancer like breast cancer, colon or rectal cancer, pancreatic cancer, and stomach cancer. This medicine may be used for other purposes; ask your health care provider or pharmacist if you have questions. COMMON BRAND NAME(S): Adrucil What should I tell my health care provider before I take this medicine? They need to know if you have any of these conditions: -blood disorders -dihydropyrimidine dehydrogenase (DPD) deficiency -infection (especially a virus infection such as chickenpox, cold sores, or herpes) -kidney disease -liver disease -malnourished, poor nutrition -recent or ongoing radiation therapy -an unusual or allergic reaction to fluorouracil, other chemotherapy, other medicines, foods, dyes, or preservatives -pregnant or trying to get pregnant -breast-feeding How should I use this medicine? This drug is given as an infusion or injection into a vein. It is administered in a hospital or clinic by a specially trained health care professional. Talk to your pediatrician regarding the use of this medicine in children.  Special care may be needed. Overdosage: If you think you have taken too much of this medicine contact a poison control center or emergency room at once. NOTE: This medicine is only for you. Do not share this medicine with others. What if I miss a dose? It is important not to miss your dose. Call your doctor or health care professional if you are unable to keep an appointment. What may interact with this medicine? -allopurinol -cimetidine -dapsone -digoxin -hydroxyurea -leucovorin -levamisole -medicines for seizures like ethotoin, fosphenytoin, phenytoin -medicines to increase blood counts like filgrastim, pegfilgrastim, sargramostim -medicines that treat or prevent blood clots like warfarin, enoxaparin, and dalteparin -methotrexate -metronidazole -pyrimethamine -some other chemotherapy drugs like busulfan, cisplatin, estramustine, vinblastine -trimethoprim -trimetrexate -vaccines Talk to your doctor or health care professional before taking any of these medicines: -acetaminophen -aspirin -ibuprofen -ketoprofen -naproxen This list may not describe all possible interactions. Give your health care provider a list of all the medicines, herbs, non-prescription drugs, or dietary supplements you use. Also tell them if you smoke, drink alcohol, or use illegal drugs. Some items may interact with your medicine. What should I watch for while using this medicine? Visit your doctor for checks on your progress. This drug may make you feel generally  unwell. This is not uncommon, as chemotherapy can affect healthy cells as well as cancer cells. Report any side effects. Continue your course of treatment even though you feel ill unless your doctor tells you to stop. In some cases, you may be given additional medicines to help with side effects. Follow all directions for their use. Call your doctor or health care professional for advice if you get a fever, chills or sore throat, or other symptoms of a  cold or flu. Do not treat yourself. This drug decreases your body's ability to fight infections. Try to avoid being around people who are sick. This medicine may increase your risk to bruise or bleed. Call your doctor or health care professional if you notice any unusual bleeding. Be careful brushing and flossing your teeth or using a toothpick because you may get an infection or bleed more easily. If you have any dental work done, tell your dentist you are receiving this medicine. Avoid taking products that contain aspirin, acetaminophen, ibuprofen, naproxen, or ketoprofen unless instructed by your doctor. These medicines may hide a fever. Do not become pregnant while taking this medicine. Women should inform their doctor if they wish to become pregnant or think they might be pregnant. There is a potential for serious side effects to an unborn child. Talk to your health care professional or pharmacist for more information. Do not breast-feed an infant while taking this medicine. Men should inform their doctor if they wish to father a child. This medicine may lower sperm counts. Do not treat diarrhea with over the counter products. Contact your doctor if you have diarrhea that lasts more than 2 days or if it is severe and watery. This medicine can make you more sensitive to the sun. Keep out of the sun. If you cannot avoid being in the sun, wear protective clothing and use sunscreen. Do not use sun lamps or tanning beds/booths. What side effects may I notice from receiving this medicine? Side effects that you should report to your doctor or health care professional as soon as possible: -allergic reactions like skin rash, itching or hives, swelling of the face, lips, or tongue -low blood counts - this medicine may decrease the number of white blood cells, red blood cells and platelets. You may be at increased risk for infections and bleeding. -signs of infection - fever or chills, cough, sore throat, pain  or difficulty passing urine -signs of decreased platelets or bleeding - bruising, pinpoint red spots on the skin, black, tarry stools, blood in the urine -signs of decreased red blood cells - unusually weak or tired, fainting spells, lightheadedness -breathing problems -changes in vision -chest pain -mouth sores -nausea and vomiting -pain, swelling, redness at site where injected -pain, tingling, numbness in the hands or feet -redness, swelling, or sores on hands or feet -stomach pain -unusual bleeding Side effects that usually do not require medical attention (report to your doctor or health care professional if they continue or are bothersome): -changes in finger or toe nails -diarrhea -dry or itchy skin -hair loss -headache -loss of appetite -sensitivity of eyes to the light -stomach upset -unusually teary eyes This list may not describe all possible side effects. Call your doctor for medical advice about side effects. You may report side effects to FDA at 1-800-FDA-1088. Where should I keep my medicine? This drug is given in a hospital or clinic and will not be stored at home. NOTE: This sheet is a summary. It may not cover all  possible information. If you have questions about this medicine, talk to your doctor, pharmacist, or health care provider.  2018 [employer] Standard (2007-06-12 13:53:16) Leucovorin injection What is this medicine? LEUCOVORIN (loo koe VOR in) is used to prevent or treat the harmful effects of some medicines. This medicine is used to treat anemia caused by a low amount of folic acid in the body. It is also used with 5-fluorouracil (5-FU) to treat colon cancer. This medicine may be used for other purposes; ask your health care provider or pharmacist if you have questions. What should I tell my health care provider before I take this medicine? They need to know if you have any of these conditions: -anemia from low levels of vitamin B-12 in the blood -an  unusual or allergic reaction to leucovorin, folic acid, other medicines, foods, dyes, or preservatives -pregnant or trying to get pregnant -breast-feeding How should I use this medicine? This medicine is for injection into a muscle or into a vein. It is given by a health care professional in a hospital or clinic setting. Talk to your pediatrician regarding the use of this medicine in children. Special care may be needed. Overdosage: If you think you have taken too much of this medicine contact a poison control center or emergency room at once. NOTE: This medicine is only for you. Do not share this medicine with others. What if I miss a dose? This does not apply. What may interact with this medicine? -capecitabine -fluorouracil -phenobarbital -phenytoin -primidone -trimethoprim-sulfamethoxazole This list may not describe all possible interactions. Give your health care provider a list of all the medicines, herbs, non-prescription drugs, or dietary supplements you use. Also tell them if you smoke, drink alcohol, or use illegal drugs. Some items may interact with your medicine. What should I watch for while using this medicine? Your condition will be monitored carefully while you are receiving this medicine. This medicine may increase the side effects of 5-fluorouracil, 5-FU. Tell your doctor or health care professional if you have diarrhea or mouth sores that do not get better or that get worse. What side effects may I notice from receiving this medicine? Side effects that you should report to your doctor or health care professional as soon as possible: -allergic reactions like skin rash, itching or hives, swelling of the face, lips, or tongue -breathing problems -fever, infection -mouth sores -unusual bleeding or bruising -unusually weak or tired Side effects that usually do not require medical attention (report to your doctor or health care professional if they continue or are  bothersome): -constipation or diarrhea -loss of appetite -nausea, vomiting This list may not describe all possible side effects. Call your doctor for medical advice about side effects. You may report side effects to FDA at 1-800-FDA-1088. Where should I keep my medicine? This drug is given in a hospital or clinic and will not be stored at home. NOTE: This sheet is a summary. It may not cover all possible information. If you have questions about this medicine, talk to your doctor, pharmacist, or health care provider.  2018 [employer] Standard (2007-08-13 16:50:29)

## 2017-11-07 NOTE — Telephone Encounter (Signed)
I have made Claib[facility] Billings aware not to add any pt's for 9/25 or 26 until we hear back from Dr Rush Landmark.

## 2017-11-08 ENCOUNTER — Inpatient Hospital Stay: Payer: Medicare Other

## 2017-11-08 ENCOUNTER — Inpatient Hospital Stay (HOSPITAL_BASED_OUTPATIENT_CLINIC_OR_DEPARTMENT_OTHER): Payer: Medicare Other | Admitting: Oncology

## 2017-11-08 ENCOUNTER — Encounter: Payer: Self-pay | Admitting: Oncology

## 2017-11-08 ENCOUNTER — Telehealth: Payer: Self-pay

## 2017-11-08 VITALS — BP 127/74 | HR 74 | Temp 96.5°F | Resp 18

## 2017-11-08 DIAGNOSIS — I10 Essential (primary) hypertension: Secondary | ICD-10-CM

## 2017-11-08 DIAGNOSIS — Z5111 Encounter for antineoplastic chemotherapy: Secondary | ICD-10-CM | POA: Diagnosis not present

## 2017-11-08 DIAGNOSIS — C259 Malignant neoplasm of pancreas, unspecified: Secondary | ICD-10-CM | POA: Diagnosis not present

## 2017-11-08 NOTE — Progress Notes (Signed)
Well Chemo Visit  Subjective:  Patient ID: Cindy Bowman, female    DOB: April 13, 1950  Age: 67 y.o. MRN: [id number]  CC:  Chief Complaint  Patient presents with  . Chemo Care Visit    HPI   Patient initially presented to the emergency room on 10/19/2017 for intractable abdominal pain.  Patient admitted to intermittent abdominal pain beginning in April 2019.  Subsequent CT scan revealed a pancreatic mass that was highly suspicious for underlying malignancy.  EUS on 10/25/2017 confirmed adenocarcinoma.  Found to have a mildly elevated CA-19-9.  PET scan revealed lesion confined to the pancreas but appeared to be abutting the SMA, therefore she would require neoadjuvant chemotherapy with the possibility for resection by Dr. Barry Dienes.  Incidentally a renal mass was identified which possibly could be a second primary.  She has a follow-up appointment with urology on November 20, 2017 after pancreatic work-up.  She was started on a pain regimen that included 25 mcg/h every 3 days Fentanyl Patch and oxycodone PRN.  Scheduled to have port placed on Monday, 11/12/2017 and to begin FOLFIRINOX chemotherapy on Wednesday, 11/14/2017 with labs and assessment by Dr. Grayland Ormond.  Patient presents to [facility] for initial meeting and discussion in preparation of starting chemotherapy. We discussed that the role of the [facility] is to provide additional resources and assistance to those who may have an increased risk for complications during the course of chemotherapy. High risk factors may include advanced age, performance status and/or comorbid conditions such as hypertension, DM and CKD.  We discussed the relationship with her primary care physician, available insurance, financial concerns/needs, access to medications and potential transportation issues.  Discussed that the goal of this program is to help prevent unplanned ER visits and to help reduce complications during chemotherapy. We introduced symptom  management clinic and their availability for same day appointment should problems arise here at the cancer center.   Today, she tells me she is doing "okay".  She completed chemotherapy education with Rosa this morning and feels overwhelmed with information.  She is currently only taking 1 prescribed medication for hypertension.  States her blood pressures are controlled.  She recently picked up prescriptions sent in by Dr. Grayland Ormond prior to initiating chemotherapy.  Can drive but does not have access to car all the time.  Relies on her son or public transportation.  Questions regarding financial support for groceries, household utilities/treatment bills.  He currently is unemployed/retired living on a fixed income.   History Patient Active Problem List   Diagnosis Date Noted  . Pancreatic adenocarcinoma ([place]) 10/18/2017   Past Medical History:  Diagnosis Date  . Essential hypertension   . UTI (urinary tract infection)    Past Surgical History:  Procedure Laterality Date  . CESAREAN SECTION    . CHOLECYSTECTOMY    . EUS N/A 10/25/2017   Procedure: FULL UPPER ENDOSCOPIC ULTRASOUND (EUS) RADIAL;  Surgeon: Jola Schmidt, MD;  Location: [facility] ENDOSCOPY;  Service: Endoscopy;  Laterality: N/A;  . OOPHORECTOMY    . TUBAL LIGATION     Allergies  Allergen Reactions  . Nsaids Other (See Comments)    Decreased GFR  . Ibuprofen     Lowers kidney function  . Penicillins     Yeast infection  Has patient had a PCN reaction causing immediate rash, facial/tongue/throat swelling, SOB or lightheadedness with hypotension: No Has patient had a PCN reaction causing severe rash involving mucus membranes or skin necrosis: No Has patient had a  PCN reaction that required hospitalization: No Has patient had a PCN reaction occurring within the last 10 years: No If all of the above answers are "NO", then may proceed with Cephalosporin use.    Prior to Admission medications   Medication Sig Start Date End Date  Taking? Authorizing Provider  fentaNYL (DURAGESIC - DOSED MCG/HR) 25 MCG/HR patch Place 1 patch (25 mcg total) onto the skin every 3 (three) days. 11/07/17  Yes Lloyd Huger, MD  lidocaine-prilocaine (EMLA) cream Apply to affected area once 11/06/17  Yes Finnegan, Kathlene November, MD  losartan-hydrochlorothiazide (HYZAAR) 50-12.5 MG tablet Take 1 tablet by mouth daily. 08/27/17  Yes [provider]  Oxycodone HCl 10 MG TABS Take 1 tablet (10 mg total) by mouth every 6 (six) hours as needed. 11/07/17  Yes Lloyd Huger, MD  docusate sodium (COLACE) 100 MG capsule Take 2 capsules (200 mg total) by mouth 2 (two) times daily. Patient not taking: Reported on 11/06/2017 10/19/17   Dustin Flock, MD  loperamide (IMODIUM A-D) 2 MG tablet Take 1 tablet (2 mg total) by mouth 4 (four) times daily as needed. Take 2 at diarrhea onset , then 1 every 2hr until 12hrs with no BM. May take 2 every 4hrs at night. If diarrhea recurs repeat. Patient not taking: Reported on 11/08/2017 11/06/17   Lloyd Huger, MD  ondansetron (ZOFRAN ODT) 4 MG disintegrating tablet Take 1 tablet (4 mg total) by mouth every 8 (eight) hours as needed for nausea or vomiting. Patient not taking: Reported on 11/08/2017 10/17/17   Earleen Newport, MD  ondansetron (ZOFRAN) 8 MG tablet Take 1 tablet (8 mg total) by mouth 2 (two) times daily as needed for refractory nausea / vomiting. Patient not taking: Reported on 11/08/2017 11/06/17   Lloyd Huger, MD  prochlorperazine (COMPAZINE) 10 MG tablet Take 1 tablet (10 mg total) by mouth every 6 (six) hours as needed (NAUSEA). Patient not taking: Reported on 11/08/2017 11/06/17   Lloyd Huger, MD   Social History   Socioeconomic History  . Marital status: Single    Spouse name: Not on file  . Number of children: Not on file  . Years of education: Not on file  . Highest education level: Not on file  Occupational History  . Not on file  Social Needs  . Financial  resource strain: Not on file  . Food insecurity:    Worry: Not on file    Inability: Not on file  . Transportation needs:    Medical: Not on file    Non-medical: Not on file  Tobacco Use  . Smoking status: Never Smoker  . Smokeless tobacco: Never Used  Substance and Sexual Activity  . Alcohol use: Not Currently  . Drug use: Never  . Sexual activity: Not on file  Lifestyle  . Physical activity:    Days per week: Not on file    Minutes per session: Not on file  . Stress: Not on file  Relationships  . Social connections:    Talks on phone: Not on file    Gets together: Not on file    Attends religious service: Not on file    Active member of club or organization: Not on file    Attends meetings of clubs or organizations: Not on file    Relationship status: Not on file  . Intimate partner violence:    Fear of current or ex partner: Not on file    Emotionally abused: Not  on file    Physically abused: Not on file    Forced sexual activity: Not on file  Other Topics Concern  . Not on file  Social History Narrative  . Not on file    Review of Systems  Constitutional: Negative.  Negative for appetite change, chills, fatigue and fever.  HENT: Negative.  Negative for congestion, mouth sores, nosebleeds, sinus pain and sore throat.   Eyes: Negative.   Respiratory: Negative.  Negative for cough, shortness of breath and wheezing.   Cardiovascular: Negative.  Negative for chest pain and leg swelling.  Gastrointestinal: Positive for abdominal pain (improved). Negative for abdominal distention, blood in stool, constipation, diarrhea, nausea and vomiting.  Endocrine: Negative.   Genitourinary: Negative.  Negative for dysuria, flank pain, frequency, hematuria and urgency.  Musculoskeletal: Negative.  Negative for arthralgias and back pain.  Skin: Negative.  Negative for pallor.  Allergic/Immunologic: Negative.   Neurological: Negative.  Negative for dizziness, weakness and headaches.    Hematological: Negative.  Negative for adenopathy.  Psychiatric/Behavioral: Negative for confusion. The patient is nervous/anxious.     Objective:  BP 127/74 (BP Location: Right Arm, Patient Position: Sitting)   Pulse 74   Temp (!) 96.5 F (35.8 C) (Tympanic)   Resp 18   Physical Exam  Constitutional: She is oriented to person, place, and time. Vital signs are normal. She appears well-developed and well-nourished.  HENT:  Head: Normocephalic and atraumatic.  Eyes: Pupils are equal, round, and reactive to light.  Neck: Normal range of motion.  Cardiovascular: Normal rate, regular rhythm and normal heart sounds.  No murmur heard. Pulmonary/Chest: Effort normal and breath sounds normal. She has no wheezes.  Abdominal: Soft. Normal appearance and bowel sounds are normal. She exhibits no distension. There is no tenderness.  Musculoskeletal: Normal range of motion. She exhibits no edema.  Neurological: She is alert and oriented to person, place, and time.  Skin: Skin is warm and dry. No rash noted.  Psychiatric: Judgment normal.    Assessment & Plan:  Tamatha Gadbois is a 67 y.o. female  who presents to chemo care prior to initiating chemotherapy which is scheduled to begin next week.   1. New Pancreatic leison: Has scheduled appointment for port placement on 11/12/17 at 2pm.  She is scheduled for cycle 1 FOLFIRINOX on 11/14/2017, MD assessment and labs. Reviewed medications recently prescribed by Dr. Grayland Ormond.  She has picked up all medications.   2. Financial concerns/barriers:Will Reach out to Elease Etienne, Education officer, museum to see when he is available for a consultation.  Offered our food pantry every 2 weeks as needed for canned goods, household and personal hygiene products.  Selection of items chosen and signed out at [employer]. Appropriate paperwork reviewed and signed for patient to use complementary [facility] [employer] services as needed.  Scheduled [employer] to help with pickup on Monday.  Her son will  be able to bring her home given she will be mildly sedated after [facility].  3. Hypertension: Continue losartan hydrochlorothiazide 50-12.5 mg daily.  Blood pressure appears stable.  Patient has 1 refill ordered if needed.  Has follow-up with PCP PRN.  Main side effects from FOLFIRINOX include cumulative sensory peripheral neuropathy and hematological concerns which are dose-limiting.  If blood pressure appears to be a problem either worsening hypertension or hypotension, will consult PCP for guidance on dose adjustment.  Greater than 50% was spent in counseling and coordination of care with this patient including but not limited to discussion of the  relevant topics above (See A&P) including, but not limited to diagnosis and management of acute and chronic medical conditions.   Pancreatic adenocarcinoma ([place]) No orders of the defined types were placed in this encounter.  There are no Patient Instructions on file for this visit.  Cindy Casa, NP 11/09/2017 2:31 PM

## 2017-11-08 NOTE — Telephone Encounter (Signed)
----- Message from Irving Copas., MD sent at 11/07/2017  6:57 PM EDT ----- Regarding: RE: pancreatic cancer patient.  Faera, That sounds fine. Let us know when and we will look into my schedule for October and get her added on for both procedures. Thanks. Gabe ----- Message ----- From: Stark Klein, MD Sent: 11/07/2017   5:46 PM EDT To: Milus Banister, MD, Timothy Lasso, RN, # Subject: RE: pancreatic cancer patient.                 She is actually getting chemo first. I am trying to be proactive so we don't have a time lag prior to sbrt, so several weeks at least. I also need her to see rad onc before you put in fiducial.  FB ----- Message ----- From: Irving Copas., MD Sent: 11/07/2017   1:45 PM EDT To: Milus Banister, MD, Stark Klein, MD, # Subject: RE: pancreatic cancer patient.                 Dorris Fetch, I hope you are well.  I just learned that we have received the fiducials and should be able to start using them within the next week once we have our charges up.  Linna Hoff is going to be with me for the first few that we do so that he can get up to speed on these as well so that we can offer this most days of the week if needed.   I spoke with Linna Hoff about his own protocol of how he was doing neuro lysis vs neural block.  I am awaiting my colleagues back at [facility] sending me what we used to use so that I can have that for my records and we can hopefully put together a protocol that Linna Hoff and I can use moving forward for our patients. I am actually in the hospital all next week and I do not have any outpatient time available in the hospital the next week after.  However, if we can set up some EUS time next Wednesday or Thursday (while in the hospital,  we could try and add her on to do the procedures.  When were you all thinking of starting her on therapy and when which she be simulated for radiation?  Chong Sicilian can you start looking at times thoughts for either the 25th or the 26th and  if we have availability and can block time that would be great ideally earlier in the day if possible. Thanks. Gabe ----- Message ----- From: Irving Copas., MD Sent: 11/06/2017   3:25 PM EDT To: Stark Klein, MD, Irving Copas., MD Subject: RE: pancreatic cancer patient.                 Dorris Fetch, I looked at [facility] EUS photographs. I think it is in a region that could be possible for placement of a Fiducial but we don't always know until we get down there, but if a FNB was possible, and position is OK, we should be able to get it done. I need to check with our group as to when we will get the Fiducials as we are awaiting the OK and the ordering process. In regards to the question of a Celiac block/neurolysis, I am not sure if we have a protocol and the materials to do this.  I have sent an email to Oretha Caprice about this to see if we do have a protocol. Once I have the answer  to both of these questions I can let you know, hopefully in the next 24-48 hours. Thanks. Gabe  ----- Message ----- From: Stark Klein, MD Sent: 11/06/2017  12:36 PM EDT To: Irving Copas., MD Subject: pancreatic cancer patient.                     This lady is a newly dx pancreatic cancer patient from [place].  She had EUS, but has borderline (probably not) resectable cancer.  We are doing neoadjuvant chemo, then most likely stereotactic radiation, though she hasn't seen them yet.  Can you look at scans and see if you could put a fiducial in?  Also, she is having intractable back pain.  Would you consider celiac block at the same time?  tx FB  ----- Message ----- From: Lloyd Huger, MD Sent: 11/05/2017  11:45 AM EDT To: Stark Klein, MD, Clent Jacks, RN  Thank you Dorris Fetch.  With the stereotactic XRT, do you have a particular concurrent chemo regimen that is used?  I am ok with both referrals and I'll take care of any chemo needed.   A Port would be great.  Thanks!  -Tim  ----- Message ----- From: Stark Klein, MD Sent: 11/05/2017   9:50 AM EDT To: Lloyd Huger, MD, Clent Jacks, RN  Dr. Grayland Ormond and Steffanie Dunn,  Thanks for referral.  I agree with neoadjuvant chemo on this lady.  With these with possible vascular involvement, we have also been doing neoadjuvant stereotactic radiation as well.  The thing that has held [place] up with timing of those is getting a fiducial marker placed in the mass.  We have a new guy that does that here with [facility].  Are you OK with me going ahead and making that referral?  Before, we had to send them to [facility], and I am imagining that you guys would have had to send them out to [facility] or [facility].    Are you arranging for port at [facility], or would you like me to place it?  Thanks Stark [facility].

## 2017-11-09 ENCOUNTER — Other Ambulatory Visit (INDEPENDENT_AMBULATORY_CARE_PROVIDER_SITE_OTHER): Payer: Self-pay | Admitting: Vascular Surgery

## 2017-11-11 MED ORDER — CIPROFLOXACIN IN D5W 400 MG/200ML IV SOLN: 400.0000 mg | INTRAVENOUS | Status: DC

## 2017-11-11 MED ORDER — CLINDAMYCIN PHOSPHATE 300 MG/50ML IV SOLN
300.0000 mg | Freq: Once | INTRAVENOUS | Status: AC
  Administered 2017-11-12: 300 mg via INTRAVENOUS

## 2017-11-11 NOTE — Progress Notes (Signed)
[place]  [phone] [phone]  ID: Cindy Bowman OB: 1950/12/16  MR#: [id number]  NAT#:[id number]  Patient Care Team: Glendon Axe, MD as PCP - General (Internal Medicine) Clent Jacks, RN as Registered Nurse  CHIEF COMPLAINT: Clinical stage Ib pancreatic adenocarcinoma.  INTERVAL HISTORY: Patient returns to clinic today for further evaluation and consideration of cycle 1 of neoadjuvant FOLFIRINOX.  She is anxious, but otherwise feels well.  Her pain is only mildly controlled on her current narcotic regimen.  She has no neurologic complaints.  She denies any recent fevers or illnesses.    She has a poor appetite, but denies weight loss.  She denies any chest pain or shortness of breath.  She has no nausea, vomiting, constipation, or diarrhea.  She has no urinary complaints.  Patient offers no further specific complaints today.  REVIEW OF SYSTEMS:   Review of Systems  Constitutional: Negative.  Negative for fever, malaise/fatigue and weight loss.  Respiratory: Negative.  Negative for cough and shortness of breath.   Cardiovascular: Negative.  Negative for chest pain and leg swelling.  Gastrointestinal: Positive for abdominal pain. Negative for blood in stool, melena, nausea and vomiting.  Genitourinary: Negative.  Negative for dysuria.  Musculoskeletal: Negative.  Negative for back pain.  Skin: Negative.  Negative for rash.  Neurological: Negative.  Negative for dizziness, focal weakness, weakness and headaches.  Psychiatric/Behavioral: The patient is nervous/anxious.     As per HPI. Otherwise, a complete review of systems is negative.  PAST MEDICAL HISTORY: Past Medical History:  Diagnosis Date  . Essential hypertension   . Pancreatic cancer ([place])   . UTI (urinary tract infection)     PAST SURGICAL HISTORY: Past Surgical History:  Procedure Laterality Date  . CESAREAN SECTION    . CHOLECYSTECTOMY    . EUS N/A 10/25/2017   Procedure: FULL UPPER ENDOSCOPIC ULTRASOUND (EUS) RADIAL;  Surgeon: Jola Schmidt, MD;  Location: [facility] ENDOSCOPY;  Service: Endoscopy;  Laterality: N/A;  . OOPHORECTOMY    . PORTA CATH INSERTION N/A 11/12/2017   Procedure: PORTA CATH INSERTION;  Surgeon: Algernon Huxley, MD;  Location: [facility];  Service: Cardiovascular;  Laterality: N/A;  . TUBAL LIGATION      FAMILY HISTORY: Family History  Problem Relation Age of Onset  . Hypertension Mother   . Heart attack Father     ADVANCED DIRECTIVES (Y/N):  N  HEALTH MAINTENANCE: Social History   Tobacco Use  . Smoking status: Never Smoker  . Smokeless tobacco: Never Used  Substance Use Topics  . Alcohol use: Not Currently  . Drug use: Never     Colonoscopy:  PAP:  Bone density:  Lipid panel:  Allergies  Allergen Reactions  . Nsaids Other (See Comments)    Decreased GFR  . Ibuprofen     Lowers kidney function  . Penicillins     Yeast infection  Has patient had a PCN reaction causing immediate rash, facial/tongue/throat swelling, SOB or lightheadedness with hypotension: No Has patient had a PCN reaction causing severe rash involving mucus membranes or skin necrosis: No Has patient had a PCN reaction that required hospitalization: No Has patient had a PCN reaction occurring within the last 10 years: No If all of the above answers are "NO", then may proceed with Cephalosporin use.     No current facility-administered medications for this visit.    Current Outpatient Medications  Medication Sig Dispense Refill  . metoCLOPramide (REGLAN) 10 MG tablet Take 1  tablet (10 mg total) by mouth 3 (three) times daily with meals. 90 tablet 0   Facility-Administered Medications Ordered in Other Visits  Medication Dose Route Frequency Provider Last Rate Last Dose  . acetaminophen (TYLENOL) tablet 650 mg  650 mg Oral Q6H PRN Saundra Shelling, MD       Or  . acetaminophen (TYLENOL) suppository 650 mg  650 mg Rectal Q6H PRN  Pyreddy, Pavan, MD      . dextrose 5 %-0.9 % sodium chloride infusion   Intravenous Continuous Saundra Shelling, MD 100 mL/hr at 11/17/17 2130    . docusate sodium (COLACE) capsule 200 mg  200 mg Oral BID Pyreddy, Pavan, MD      . enoxaparin (LOVENOX) injection 40 mg  40 mg Subcutaneous Q24H Pyreddy, Pavan, MD      . feeding supplement (BOOST / RESOURCE BREEZE) liquid 1 Container  1 Container Oral TID BM Pyreddy, Pavan, MD      . fentaNYL ([place] - dosed mcg/hr) patch 25 mcg  25 mcg Transdermal Q72H Saundra Shelling, MD   25 mcg at 11/16/17 1300  . ondansetron (ZOFRAN) tablet 4 mg  4 mg Oral Q6H PRN Saundra Shelling, MD       Or  . ondansetron (ZOFRAN) injection 4 mg  4 mg Intravenous Q6H PRN Saundra Shelling, MD   4 mg at 11/16/17 1835  . oxyCODONE (Oxy IR/ROXICODONE) immediate release tablet 10 mg  10 mg Oral Q6H PRN Saundra Shelling, MD   10 mg at 11/17/17 0533  . sodium chloride flush (NS) 0.9 % injection 10-40 mL  10-40 mL Intracatheter PRN Saundra Shelling, MD        OBJECTIVE: Vitals:   11/14/17 0831 11/14/17 0837  BP:  108/73  Pulse:  80  Resp: 12   Temp:  (!) 97.4 F (36.3 C)     Body mass index is 30.33 kg/m.    ECOG FS:0 - Asymptomatic  General: Well-developed, well-nourished, no acute distress. Eyes: Pink conjunctiva, anicteric sclera. HEENT: Normocephalic, moist mucous membranes. Lungs: Clear to auscultation bilaterally. Heart: Regular rate and rhythm. No rubs, murmurs, or gallops. Abdomen: Soft, nontender, nondistended. No organomegaly noted, normoactive bowel sounds. Musculoskeletal: No edema, cyanosis, or clubbing. Neuro: Alert, answering all questions appropriately. Cranial nerves grossly intact. Skin: No rashes or petechiae noted. Psych: Normal affect.  LAB RESULTS:  Lab Results  Component Value Date   NA 140 11/17/2017   K 3.6 11/17/2017   CL 111 11/17/2017   CO2 24 11/17/2017   GLUCOSE 127 (H) 11/17/2017   BUN 11 11/17/2017   CREATININE 1.24 (H) 11/17/2017    CALCIUM 8.1 (L) 11/17/2017   PROT 7.4 11/15/2017   ALBUMIN 3.7 11/15/2017   AST 32 11/15/2017   ALT 27 11/15/2017   ALKPHOS 94 11/15/2017   BILITOT 0.7 11/15/2017   GFRNONAA 44 (L) 11/17/2017   GFRAA 51 (L) 11/17/2017    Lab Results  Component Value Date   WBC 8.0 11/17/2017   NEUTROABS 7.1 (H) 11/14/2017   HGB 10.9 (L) 11/17/2017   HCT 32.0 (L) 11/17/2017   MCV 93.3 11/17/2017   PLT 225 11/17/2017     STUDIES: Nm Pet Image Initial (pi) Skull Base To Thigh  Result Date: 10/30/2017 CLINICAL DATA:  Initial treatment strategy for pancreatic adenocarcinoma. EXAM: NUCLEAR MEDICINE PET SKULL BASE TO THIGH TECHNIQUE: 9.42 mCi F-18 FDG was injected intravenously. Full-ring PET imaging was performed from the skull base to thigh after the radiotracer. CT data was obtained and used for  attenuation correction and anatomic localization. Fasting blood glucose: 119 mg/dl COMPARISON:  CT scan 10/17/2017 FINDINGS: Mediastinal blood pool activity: SUV max 2.99 NECK: No hypermetabolic lymph nodes in the neck. Incidental CT findings: none CHEST: No hypermetabolic mediastinal or hilar nodes. No suspicious pulmonary nodules on the CT scan. Incidental CT findings: No breast masses, supraclavicular or axillary adenopathy. ABDOMEN/PELVIS: The 3.0 x 3.0 cm pancreatic head mass is hypermetabolic with SUV max of 8.29. No adjacent lymphadenopathy. No findings for hepatic metastatic disease. The adrenal glands are unremarkable. Marked hypermetabolism noted in the right periurethral region. 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 It is better demonstrated on the prior CT scan. Anal activity may be related to internal hemorrhoids. Recommend correlation with physical examination. Incidental CT findings: Status post cholecystectomy. No biliary dilatation. Remarkably no atherosclerotic calcifications involving the aorta and iliac arteries. Periumbilical anterior abdominal wall hernia containing  fat. Sigmoid colon diverticulosis without findings for acute diverticulitis. SKELETON: No focal hypermetabolic activity to suggest skeletal metastasis. Incidental CT findings: none IMPRESSION: 1. 3 cm pancreatic head mass is hypermetabolic and consistent with known pancreatic adenocarcinoma. No surrounding adenopathy or evidence of hepatic metastatic disease. 2. No findings for metastatic disease involving the chest or bony structures. 3. 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 Electronically Signed   By: Marijo Sanes M.D.   On: 10/30/2017 16:51    ASSESSMENT: Clinical stage Ib pancreatic adenocarcinoma.  PLAN:    1.  Clinical stage Ib pancreatic adenocarcinoma: EUS completed on October 25, 2017 confirmed adenocarcinoma. Patient CA-19-9 is mildly elevated at 78.  PET scan results reviewed independently and reported as above confirming no other disease outside the pancreas.  After review of cancer conference and with radiology, it appears the lesion is abutting the SMA, therefore patient will require neoadjuvant chemotherapy.  She was evaluated by surgery and is considered borderline resectable.  Patient may also require stereotactic XRT prior to surgery.  Proceed with cycle 1 of neoadjuvant FOLFIRINOX today.  Return to clinic in 2 days for pump removal, 1 week for laboratory work and further evaluation, in 2 weeks for further evaluation and consideration of cycle 2 of 4 of neoadjuvant FOLFIRINOX. 2.  Palliative care: Patient was given a referral for further evaluation. 3.  Pain: Patient is instructed to increase her fentanyl patch to 50 mcg every 3 hours.  Continue oxycodone as prescribed.   4.  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    No intervention is needed at this time.  We will continue to monitor while patient is undergoing treatment for her pancreatic cancer.  Patient has follow-up with urology on  November 20, 2017. 4.  Genetic: Patient was given to referral for genetics for further evaluation and counseling.  I spent a total of 30 minutes face-to-face with the patient of which greater than 50% of the visit was spent in counseling and coordination of care as detailed above.    Patient expressed understanding and was in agreement with this plan. She also understands that She can call clinic at any time with any questions, concerns, or complaints.   Cancer Staging Pancreatic adenocarcinoma [facility]) Staging form: Exocrine Pancreas, AJCC 8th Edition - Clinical stage from 10/26/2017: Stage IB (cT2, cN0, cM0) - Signed by Lloyd Huger, MD on 10/26/2017   Lloyd Huger, MD   11/17/2017 8:09 AM

## 2017-11-12 ENCOUNTER — Encounter
Admission: RE | Disposition: A | Discharge status: Home / Self Care | Payer: Self-pay | Source: Ambulatory Visit | Attending: Vascular Surgery

## 2017-11-12 ENCOUNTER — Ambulatory Visit
Admission: RE | Admit: 2017-11-12 | Discharge: 2017-11-12 | Disposition: A | Discharge status: Home / Self Care | Payer: Medicare Other | Source: Ambulatory Visit | Attending: Vascular Surgery | Admitting: Vascular Surgery

## 2017-11-12 DIAGNOSIS — Z9851 Tubal ligation status: Secondary | ICD-10-CM | POA: Diagnosis not present

## 2017-11-12 DIAGNOSIS — Z886 Allergy status to analgesic agent status: Secondary | ICD-10-CM | POA: Insufficient documentation

## 2017-11-12 DIAGNOSIS — Z9889 Other specified postprocedural states: Secondary | ICD-10-CM | POA: Insufficient documentation

## 2017-11-12 DIAGNOSIS — Z87440 Personal history of urinary (tract) infections: Secondary | ICD-10-CM | POA: Insufficient documentation

## 2017-11-12 DIAGNOSIS — I10 Essential (primary) hypertension: Secondary | ICD-10-CM | POA: Insufficient documentation

## 2017-11-12 DIAGNOSIS — Z9049 Acquired absence of other specified parts of digestive tract: Secondary | ICD-10-CM | POA: Diagnosis not present

## 2017-11-12 DIAGNOSIS — Z880 Allergy status to penicillin: Secondary | ICD-10-CM | POA: Diagnosis not present

## 2017-11-12 DIAGNOSIS — C259 Malignant neoplasm of pancreas, unspecified: Secondary | ICD-10-CM | POA: Diagnosis not present

## 2017-11-12 HISTORY — PX: PORTA CATH INSERTION: CATH118285

## 2017-11-12 SURGERY — PORTA CATH INSERTION
Anesthesia: Moderate Sedation

## 2017-11-12 MED ORDER — FENTANYL CITRATE (PF) 100 MCG/2ML IJ SOLN
INTRAMUSCULAR | Status: DC | PRN
  Administered 2017-11-12: 50 ug via INTRAVENOUS

## 2017-11-12 MED ORDER — ONDANSETRON HCL 4 MG/2ML IJ SOLN
INTRAMUSCULAR | Status: AC
  Filled 2017-11-12: qty 2

## 2017-11-12 MED ORDER — CLINDAMYCIN PHOSPHATE 300 MG/50ML IV SOLN
INTRAVENOUS | Status: AC
  Administered 2017-11-12: 300 mg via INTRAVENOUS
  Filled 2017-11-12: qty 50

## 2017-11-12 MED ORDER — ONDANSETRON HCL 4 MG/2ML IJ SOLN
4.0000 mg | Freq: Four times a day (QID) | INTRAMUSCULAR | Status: DC | PRN
  Administered 2017-11-12: 4 mg via INTRAVENOUS

## 2017-11-12 MED ORDER — HYDROMORPHONE HCL 1 MG/ML IJ SOLN: 1.0000 mg | Freq: Once | INTRAMUSCULAR | Status: DC | PRN

## 2017-11-12 MED ORDER — HEPARIN (PORCINE) IN NACL 1000-0.9 UT/500ML-% IV SOLN
INTRAVENOUS | Status: AC
  Filled 2017-11-12: qty 500

## 2017-11-12 MED ORDER — ONDANSETRON HCL 4 MG/2ML IJ SOLN
INTRAMUSCULAR | Status: AC
  Administered 2017-11-12: 4 mg via INTRAVENOUS
  Filled 2017-11-12: qty 2

## 2017-11-12 MED ORDER — SODIUM CHLORIDE 0.9 % IV SOLN
INTRAVENOUS | Status: DC
  Administered 2017-11-12: 15:00:00 via INTRAVENOUS

## 2017-11-12 MED ORDER — GABAPENTIN 300 MG PO CAPS: 300.0000 mg | ORAL_CAPSULE | ORAL | Status: DC

## 2017-11-12 MED ORDER — ONDANSETRON HCL 4 MG/2ML IJ SOLN
4.0000 mg | Freq: Once | INTRAMUSCULAR | Status: AC
  Administered 2017-11-12: 4 mg via INTRAVENOUS

## 2017-11-12 MED ORDER — ONDANSETRON HCL 4 MG/2ML IJ SOLN: 4.0000 mg | Freq: Four times a day (QID) | INTRAMUSCULAR | Status: DC

## 2017-11-12 MED ORDER — SODIUM CHLORIDE 0.9 % IV SOLN
Freq: Once | INTRAVENOUS | Status: DC
  Filled 2017-11-12: qty 2

## 2017-11-12 MED ORDER — MIDAZOLAM HCL 2 MG/2ML IJ SOLN
INTRAMUSCULAR | Status: DC | PRN
  Administered 2017-11-12: 2 mg via INTRAVENOUS

## 2017-11-12 MED ORDER — MIDAZOLAM HCL 5 MG/5ML IJ SOLN
INTRAMUSCULAR | Status: AC
  Filled 2017-11-12: qty 5

## 2017-11-12 MED ORDER — LIDOCAINE-EPINEPHRINE (PF) 1 %-1:200000 IJ SOLN
INTRAMUSCULAR | Status: AC
  Filled 2017-11-12: qty 30

## 2017-11-12 MED ORDER — ACETAMINOPHEN 500 MG PO TABS: 1000.0000 mg | ORAL_TABLET | ORAL | Status: DC

## 2017-11-12 MED ORDER — FENTANYL CITRATE (PF) 100 MCG/2ML IJ SOLN
INTRAMUSCULAR | Status: AC
  Filled 2017-11-12: qty 2

## 2017-11-12 SURGICAL SUPPLY — 8 items
KIT PORT POWER 8FR ISP CVUE (Port) ×3 IMPLANT
PACK ANGIOGRAPHY (CUSTOM PROCEDURE TRAY) ×3 IMPLANT
PAD GROUND ADULT SPLIT (MISCELLANEOUS) ×3 IMPLANT
PENCIL ELECTRO HAND CTR (MISCELLANEOUS) ×3 IMPLANT
SUT MNCRL AB 4-0 PS2 18 (SUTURE) ×3 IMPLANT
SUT PROLENE 0 CT 1 30 (SUTURE) ×3 IMPLANT
SUT VIC AB 3-0 SH 27 (SUTURE) ×2
SUT VIC AB 3-0 SH 27X BRD (SUTURE) ×1 IMPLANT

## 2017-11-12 NOTE — H&P (Signed)
[facility] History & Physical Update  The patient was interviewed and re-examined.  The patient's previous History and Physical has been reviewed and is unchanged.  There is no change in the plan of care. We plan to proceed with the scheduled procedure.  Leotis Pain, MD  11/12/2017, 2:11 PM

## 2017-11-12 NOTE — Discharge Instructions (Signed)
Moderate Conscious Sedation, Adult, Care After °These instructions provide you with information about caring for yourself after your procedure. Your health care provider may also give you more specific instructions. Your treatment has been planned according to current medical practices, but problems sometimes occur. Call your health care provider if you have any problems or questions after your procedure. °What can I expect after the procedure? °After your procedure, it is common: °· To feel sleepy for several hours. °· To feel clumsy and have poor balance for several hours. °· To have poor judgment for several hours. °· To vomit if you eat too soon. ° °Follow these instructions at home: °For at least 24 hours after the procedure: ° °· Do not: °? Participate in activities where you could fall or become injured. °? Drive. °? Use heavy machinery. °? Drink alcohol. °? Take sleeping pills or medicines that cause drowsiness. °? Make important decisions or sign legal documents. °? Take care of children on your own. °· Rest. °Eating and drinking °· Follow the diet recommended by your health care provider. °· If you vomit: °? Drink water, juice, or soup when you can drink without vomiting. °? Make sure you have little or no nausea before eating solid foods. °General instructions °· Have a responsible adult stay with you until you are awake and alert. °· Take over-the-counter and prescription medicines only as told by your health care provider. °· If you smoke, do not smoke without supervision. °· Keep all follow-up visits as told by your health care provider. This is important. °Contact a health care provider if: °· You keep feeling nauseous or you keep vomiting. °· You feel light-headed. °· You develop a rash. °· You have a fever. °Get help right away if: °· You have trouble breathing. °This information is not intended to replace advice given to you by your health care provider. Make sure you discuss any questions you have  with your health care provider. °Document Released: 11/27/2012 Document Revised: 07/12/2015 Document Reviewed: 05/29/2015 °Elsevier Interactive Patient Education © 2018 [employer] °Implanted Port Insertion, Care After °This sheet gives you information about how to care for yourself after your procedure. Your health care provider may also give you more specific instructions. If you have problems or questions, contact your health care provider. °What can I expect after the procedure? °After your procedure, it is common to have: °· Discomfort at the port insertion site. °· Bruising on the skin over the port. This should improve over 3-4 days. ° °Follow these instructions at home: °Port care °· After your port is placed, you will get a manufacturer's information card. The card has information about your port. Keep this card with you at all times. °· Take care of the port as told by your health care provider. Ask your health care provider if you or a family member can get training for taking care of the port at home. A home health care nurse may also take care of the port. °· Make sure to remember what type of port you have. °Incision care °· Follow instructions from your health care provider about how to take care of your port insertion site. Make sure you: °? Wash your hands with soap and water before you change your bandage (dressing). If soap and water are not available, use hand sanitizer. °? Change your dressing as told by your health care provider. °? Leave stitches (sutures), skin glue, or adhesive strips in place. These skin closures may need to stay   in place for 2 weeks or longer. If adhesive strip edges start to loosen and curl up, you may trim the loose edges. Do not remove adhesive strips completely unless your health care provider tells you to do that. °· Check your port insertion site every day for signs of infection. Check for: °? More redness, swelling, or pain. °? More fluid or  blood. °? Warmth. °? Pus or a bad smell. °General instructions °· Do not take baths, swim, or use a hot tub until your health care provider approves. °· Do not lift anything that is heavier than 10 lb (4.5 kg) for a week, or as told by your health care provider. °· Ask your health care provider when it is okay to: °? Return to work or school. °? Resume usual physical activities or sports. °· Do not drive for 24 hours if you were given a medicine to help you relax (sedative). °· Take over-the-counter and prescription medicines only as told by your health care provider. °· Wear a medical alert bracelet in case of an emergency. This will tell any health care providers that you have a port. °· Keep all follow-up visits as told by your health care provider. This is important. °Contact a health care provider if: °· You cannot flush your port with saline as directed, or you cannot draw blood from the port. °· You have a fever or chills. °· You have more redness, swelling, or pain around your port insertion site. °· You have more fluid or blood coming from your port insertion site. °· Your port insertion site feels warm to the touch. °· You have pus or a bad smell coming from the port insertion site. °Get help right away if: °· You have chest pain or shortness of breath. °· You have bleeding from your port that you cannot control. °Summary °· Take care of the port as told by your health care provider. °· Change your dressing as told by your health care provider. °· Keep all follow-up visits as told by your health care provider. °This information is not intended to replace advice given to you by your health care provider. Make sure you discuss any questions you have with your health care provider. °Document Released: 11/27/2012 Document Revised: 12/29/2015 Document Reviewed: 12/29/2015 °Elsevier Interactive Patient Education © 2017 Elsevier Inc. ° °

## 2017-11-12 NOTE — Op Note (Signed)
      [facility]       Operative Note  Date: 11/12/2017  Preoperative diagnosis:  1. Pancreas cancer  Postoperative diagnosis:  Same as above  Procedures: #1. Ultrasound guidance for vascular access to the right internal jugular vein. #2. Fluoroscopic guidance for placement of catheter. #3. Placement of CT compatible Port-A-Cath, right internal jugular vein.  Surgeon: Leotis Pain, MD.   Anesthesia: Local with moderate conscious sedation for approximately 20  minutes using 2 mg of Versed and 50 mcg of Fentanyl  Fluoroscopy time: less than 1 minute  Contrast used: 0  Estimated blood loss: 5 cc  Indication for the procedure:  The patient is a 67 y.o.female with pancreatic cancer.  The patient needs a Port-A-Cath for durable venous access, chemotherapy, lab draws, and CT scans. We are asked to place this. Risks and benefits were discussed and informed consent was obtained.  Description of procedure: The patient was brought to the vascular and interventional radiology suite.  Moderate conscious sedation was administered throughout the procedure during a face to face encounter with the patient with my supervision of the RN administering medicines and monitoring the patient's vital signs, pulse oximetry, telemetry and mental status throughout from the start of the procedure until the patient was taken to the recovery room. The right neck chest and shoulder were sterilely prepped and draped, and a sterile surgical field was created. Ultrasound was used to help visualize a patent right internal jugular vein. This was then accessed under direct ultrasound guidance without difficulty with the Seldinger needle and a permanent image was recorded. A J-wire was placed. After skin nick and dilatation, the peel-away sheath was then placed over the wire. I then anesthetized an area under the clavicle approximately 1-2 fingerbreadths. A transverse incision was created and an inferior  pocket was created with electrocautery and blunt dissection. The port was then brought onto the field, placed into the pocket and secured to the chest wall with 2 Prolene sutures. The catheter was connected to the port and tunneled from the subclavicular incision to the access site. Fluoroscopic guidance was then used to cut the catheter to an appropriate length. The catheter was then placed through the peel-away sheath and the peel-away sheath was removed. The catheter tip was parked in excellent location under fluorocoscopic guidance in the cavoatrial junction. The pocket was then irrigated with antibiotic impregnated saline and the wound was closed with a running 3-0 Vicryl and a 4-0 Monocryl. The access incision was closed with a single 4-0 Monocryl. The Huber needle was used to withdraw blood and flush the port with heparinized saline. Dermabond was then placed as a dressing. The patient tolerated the procedure well and was taken to the recovery room in stable condition.   Leotis Pain 11/12/2017 5:11 PM   This note was created with Dragon Medical transcription system. Any errors in dictation are purely unintentional.

## 2017-11-13 ENCOUNTER — Encounter: Payer: Self-pay | Admitting: Vascular Surgery

## 2017-11-14 ENCOUNTER — Other Ambulatory Visit: Payer: Self-pay | Admitting: *Deleted

## 2017-11-14 ENCOUNTER — Encounter: Payer: Self-pay | Admitting: Oncology

## 2017-11-14 ENCOUNTER — Inpatient Hospital Stay (HOSPITAL_BASED_OUTPATIENT_CLINIC_OR_DEPARTMENT_OTHER): Payer: Medicare Other | Admitting: Oncology

## 2017-11-14 ENCOUNTER — Inpatient Hospital Stay: Payer: Medicare Other

## 2017-11-14 ENCOUNTER — Other Ambulatory Visit: Payer: Self-pay

## 2017-11-14 VITALS — BP 150/80 | HR 90 | Temp 97.8°F

## 2017-11-14 VITALS — BP 108/73 | HR 80 | Temp 97.4°F | Resp 12 | Ht 63.0 in | Wt 171.2 lb

## 2017-11-14 DIAGNOSIS — R109 Unspecified abdominal pain: Secondary | ICD-10-CM | POA: Diagnosis not present

## 2017-11-14 DIAGNOSIS — N289 Disorder of kidney and ureter, unspecified: Secondary | ICD-10-CM

## 2017-11-14 DIAGNOSIS — C259 Malignant neoplasm of pancreas, unspecified: Secondary | ICD-10-CM

## 2017-11-14 DIAGNOSIS — R970 Elevated carcinoembryonic antigen [CEA]: Secondary | ICD-10-CM | POA: Diagnosis not present

## 2017-11-14 DIAGNOSIS — R630 Anorexia: Secondary | ICD-10-CM

## 2017-11-14 DIAGNOSIS — Z5111 Encounter for antineoplastic chemotherapy: Secondary | ICD-10-CM | POA: Diagnosis not present

## 2017-11-14 LAB — COMPREHENSIVE METABOLIC PANEL
ALBUMIN: 3.6 g/dL (ref 3.5–5.0)
ALT: 21 U/L (ref 0–44)
ANION GAP: 14 (ref 5–15)
AST: 29 U/L (ref 15–41)
Alkaline Phosphatase: 97 U/L (ref 38–126)
BILIRUBIN TOTAL: 0.6 mg/dL (ref 0.3–1.2)
BUN: 14 mg/dL (ref 8–23)
CHLORIDE: 107 mmol/L (ref 98–111)
CO2: 24 mmol/L (ref 22–32)
Calcium: 9.2 mg/dL (ref 8.9–10.3)
Creatinine, Ser: 1.51 mg/dL — ABNORMAL HIGH (ref 0.44–1.00)
GFR calc Af Amer: 40 mL/min — ABNORMAL LOW (ref >60)
GFR, EST NON AFRICAN AMERICAN: 34 mL/min — AB (ref >60)
GLUCOSE: 141 mg/dL — AB (ref 70–99)
POTASSIUM: 3.6 mmol/L (ref 3.5–5.1)
Sodium: 145 mmol/L (ref 135–145)
TOTAL PROTEIN: 7.4 g/dL (ref 6.5–8.1)

## 2017-11-14 LAB — CBC WITH DIFFERENTIAL/PLATELET
BASOS ABS: 0 10*3/uL (ref 0–0.1)
Basophils Relative: 0 %
Eosinophils Absolute: 0.2 10*3/uL (ref 0–0.7)
Eosinophils Relative: 2 %
HEMATOCRIT: 33.2 % — AB (ref 35.0–47.0)
Hemoglobin: 11.3 g/dL — ABNORMAL LOW (ref 12.0–16.0)
LYMPHS ABS: 3.4 10*3/uL (ref 1.0–3.6)
Lymphocytes Relative: 29 %
MCH: 32 pg (ref 26.0–34.0)
MCHC: 34.2 g/dL (ref 32.0–36.0)
MCV: 93.8 fL (ref 80.0–100.0)
Monocytes Absolute: 0.9 10*3/uL (ref 0.2–0.9)
Monocytes Relative: 7 %
NEUTROS ABS: 7.1 10*3/uL — AB (ref 1.4–6.5)
Neutrophils Relative %: 62 %
Platelets: 270 10*3/uL (ref 150–440)
RBC: 3.54 MIL/uL — AB (ref 3.80–5.20)
RDW: 14.2 % (ref 11.5–14.5)
WBC: 11.6 10*3/uL — AB (ref 3.6–11.0)

## 2017-11-14 MED ORDER — DEXTROSE 5 % IV SOLN
Freq: Once | INTRAVENOUS | Status: AC
  Administered 2017-11-14: 10:00:00 via INTRAVENOUS
  Filled 2017-11-14: qty 250

## 2017-11-14 MED ORDER — SODIUM CHLORIDE 0.9 % IV SOLN: 10.0000 mg | Freq: Once | INTRAVENOUS | Status: DC

## 2017-11-14 MED ORDER — DEXAMETHASONE SODIUM PHOSPHATE 10 MG/ML IJ SOLN
10.0000 mg | Freq: Once | INTRAMUSCULAR | Status: AC
  Administered 2017-11-14: 10 mg via INTRAVENOUS
  Filled 2017-11-14: qty 1

## 2017-11-14 MED ORDER — ONDANSETRON HCL 4 MG/2ML IJ SOLN
8.0000 mg | Freq: Once | INTRAMUSCULAR | Status: DC
  Filled 2017-11-14: qty 4

## 2017-11-14 MED ORDER — IRINOTECAN HCL CHEMO INJECTION 100 MG/5ML
180.0000 mg/m2 | Freq: Once | INTRAVENOUS | Status: AC
  Administered 2017-11-14: 340 mg via INTRAVENOUS
  Filled 2017-11-14: qty 15

## 2017-11-14 MED ORDER — OXALIPLATIN CHEMO INJECTION 100 MG/20ML
150.0000 mg | Freq: Once | INTRAVENOUS | Status: AC
  Administered 2017-11-14: 150 mg via INTRAVENOUS
  Filled 2017-11-14: qty 20

## 2017-11-14 MED ORDER — SODIUM CHLORIDE 0.9% FLUSH
10.0000 mL | Freq: Once | INTRAVENOUS | Status: AC
  Administered 2017-11-14: 10 mL via INTRAVENOUS
  Filled 2017-11-14: qty 10

## 2017-11-14 MED ORDER — SODIUM CHLORIDE 0.9 % IV SOLN
2400.0000 mg/m2 | INTRAVENOUS | Status: DC
  Administered 2017-11-14: 4550 mg via INTRAVENOUS
  Filled 2017-11-14: qty 91

## 2017-11-14 MED ORDER — LEUCOVORIN CALCIUM INJECTION 350 MG
750.0000 mg | Freq: Once | INTRAVENOUS | Status: AC
  Administered 2017-11-14: 750 mg via INTRAVENOUS
  Filled 2017-11-14: qty 25

## 2017-11-14 MED ORDER — ATROPINE SULFATE 1 MG/ML IJ SOLN
0.5000 mg | Freq: Once | INTRAMUSCULAR | Status: AC | PRN
  Administered 2017-11-14: 0.5 mg via INTRAVENOUS
  Filled 2017-11-14: qty 1

## 2017-11-14 MED ORDER — LORAZEPAM 2 MG/ML IJ SOLN
INTRAMUSCULAR | Status: AC
  Filled 2017-11-14: qty 1

## 2017-11-14 MED ORDER — LORAZEPAM 2 MG/ML IJ SOLN
1.0000 mg | Freq: Once | INTRAMUSCULAR | Status: AC
  Administered 2017-11-14: 1 mg via INTRAVENOUS

## 2017-11-14 MED ORDER — FLUOROURACIL CHEMO INJECTION 2.5 GM/50ML
400.0000 mg/m2 | Freq: Once | INTRAVENOUS | Status: AC
  Administered 2017-11-14: 750 mg via INTRAVENOUS
  Filled 2017-11-14: qty 15

## 2017-11-14 MED ORDER — HEPARIN SOD (PORK) LOCK FLUSH 100 UNIT/ML IV SOLN
500.0000 [IU] | Freq: Once | INTRAVENOUS | Status: DC
  Filled 2017-11-14: qty 5

## 2017-11-14 MED ORDER — PALONOSETRON HCL INJECTION 0.25 MG/5ML
0.2500 mg | Freq: Once | INTRAVENOUS | Status: AC
  Administered 2017-11-14: 0.25 mg via INTRAVENOUS
  Filled 2017-11-14: qty 5

## 2017-11-14 MED ORDER — SODIUM CHLORIDE 0.9 % IV SOLN: Freq: Once | INTRAVENOUS | Status: DC

## 2017-11-14 NOTE — Progress Notes (Signed)
Approximately an hour into receiving camptostar/leuco (1352)patient complained of nausea, dry heaving, stopped meds, vitals taken and stable (see flowsheet), spoke with Dr. Grayland Ormond who ordered ativan 1 mg.  Patient's nausea subsided approximately 20 minutes later, continued chemo and patient tolerated well.

## 2017-11-14 NOTE — Progress Notes (Signed)
Patient here for pre treatment check. She has complaints of constant pain in her mid back despite Duragesic patch and oxycodone. Level 5 today.

## 2017-11-14 NOTE — Progress Notes (Signed)
Proceed with treatment today per Dr. Finnegan 

## 2017-11-15 ENCOUNTER — Encounter: Payer: Self-pay | Admitting: *Deleted

## 2017-11-15 ENCOUNTER — Telehealth: Payer: Self-pay | Admitting: Oncology

## 2017-11-15 ENCOUNTER — Other Ambulatory Visit: Payer: Self-pay

## 2017-11-15 DIAGNOSIS — I10 Essential (primary) hypertension: Secondary | ICD-10-CM | POA: Insufficient documentation

## 2017-11-15 DIAGNOSIS — Z8249 Family history of ischemic heart disease and other diseases of the circulatory system: Secondary | ICD-10-CM | POA: Diagnosis not present

## 2017-11-15 DIAGNOSIS — E860 Dehydration: Secondary | ICD-10-CM | POA: Diagnosis not present

## 2017-11-15 DIAGNOSIS — Z880 Allergy status to penicillin: Secondary | ICD-10-CM | POA: Diagnosis not present

## 2017-11-15 DIAGNOSIS — Z886 Allergy status to analgesic agent status: Secondary | ICD-10-CM | POA: Insufficient documentation

## 2017-11-15 DIAGNOSIS — Z79899 Other long term (current) drug therapy: Secondary | ICD-10-CM | POA: Diagnosis not present

## 2017-11-15 DIAGNOSIS — T451X5A Adverse effect of antineoplastic and immunosuppressive drugs, initial encounter: Secondary | ICD-10-CM | POA: Diagnosis present

## 2017-11-15 DIAGNOSIS — C259 Malignant neoplasm of pancreas, unspecified: Secondary | ICD-10-CM | POA: Insufficient documentation

## 2017-11-15 LAB — COMPREHENSIVE METABOLIC PANEL
ALBUMIN: 3.7 g/dL (ref 3.5–5.0)
ALT: 27 U/L (ref 0–44)
ANION GAP: 9 (ref 5–15)
AST: 32 U/L (ref 15–41)
Alkaline Phosphatase: 94 U/L (ref 38–126)
BUN: 19 mg/dL (ref 8–23)
CALCIUM: 9.1 mg/dL (ref 8.9–10.3)
CO2: 25 mmol/L (ref 22–32)
Chloride: 106 mmol/L (ref 98–111)
Creatinine, Ser: 1.53 mg/dL — ABNORMAL HIGH (ref 0.44–1.00)
GFR calc non Af Amer: 34 mL/min — ABNORMAL LOW (ref >60)
GFR, EST AFRICAN AMERICAN: 39 mL/min — AB (ref >60)
Glucose, Bld: 113 mg/dL — ABNORMAL HIGH (ref 70–99)
POTASSIUM: 4.4 mmol/L (ref 3.5–5.1)
Sodium: 140 mmol/L (ref 135–145)
Total Bilirubin: 0.7 mg/dL (ref 0.3–1.2)
Total Protein: 7.4 g/dL (ref 6.5–8.1)

## 2017-11-15 LAB — CBC
HEMATOCRIT: 35.4 % (ref 35.0–47.0)
HEMOGLOBIN: 12.3 g/dL (ref 12.0–16.0)
MCH: 32.2 pg (ref 26.0–34.0)
MCHC: 34.7 g/dL (ref 32.0–36.0)
MCV: 92.6 fL (ref 80.0–100.0)
Platelets: 273 10*3/uL (ref 150–440)
RBC: 3.82 MIL/uL (ref 3.80–5.20)
RDW: 14.1 % (ref 11.5–14.5)
WBC: 14.3 10*3/uL — AB (ref 3.6–11.0)

## 2017-11-15 LAB — LIPASE, BLOOD: Lipase: 42 U/L (ref 11–51)

## 2017-11-15 NOTE — ED Triage Notes (Signed)
Pt to triage via wheelchair.  Pt reports vomiting x 5 today   No diarrhea.  Pt has abd pain.  Hx pancreatic cancer.  Pt taking chemo thru port now.  Pt alert.

## 2017-11-15 NOTE — ED Notes (Signed)
Unable to void at this time.

## 2017-11-15 NOTE — Telephone Encounter (Signed)
Patient called and reports that she continues vomiting, despite taking her home antiemetics. "I can not hold anything down".  S/p chemotherapy 11/14/2017.  Advise patient to go to ER for IV anti-emetics and hydration. Patient voices understanding.  Called ER triage nurse and give patient information.  Cc Dr.Finnegan

## 2017-11-16 ENCOUNTER — Telehealth: Payer: Self-pay

## 2017-11-16 ENCOUNTER — Encounter: Payer: Self-pay | Admitting: *Deleted

## 2017-11-16 ENCOUNTER — Inpatient Hospital Stay: Payer: Medicare Other

## 2017-11-16 ENCOUNTER — Observation Stay
Admission: EM | Admit: 2017-11-16 | Discharge: 2017-11-18 | Disposition: A | Discharge status: Home / Self Care | Payer: Medicare Other | Attending: Internal Medicine | Admitting: Internal Medicine

## 2017-11-16 ENCOUNTER — Encounter: Payer: Self-pay | Admitting: Internal Medicine

## 2017-11-16 ENCOUNTER — Other Ambulatory Visit: Payer: Self-pay

## 2017-11-16 DIAGNOSIS — T451X5A Adverse effect of antineoplastic and immunosuppressive drugs, initial encounter: Secondary | ICD-10-CM

## 2017-11-16 DIAGNOSIS — R112 Nausea with vomiting, unspecified: Secondary | ICD-10-CM | POA: Diagnosis present

## 2017-11-16 HISTORY — DX: Malignant neoplasm of pancreas, unspecified: C25.9

## 2017-11-16 LAB — URINALYSIS, COMPLETE (UACMP) WITH MICROSCOPIC
Bacteria, UA: NONE SEEN
Bilirubin Urine: NEGATIVE
Glucose, UA: NEGATIVE mg/dL
Hgb urine dipstick: NEGATIVE
Ketones, ur: 5 mg/dL — AB
Nitrite: NEGATIVE
PROTEIN: NEGATIVE mg/dL
Specific Gravity, Urine: 1.016 (ref 1.005–1.030)
WBC, UA: >50 WBC/hpf — ABNORMAL HIGH (ref 0–5)
pH: 5 (ref 5.0–8.0)

## 2017-11-16 LAB — CBC
HEMATOCRIT: 31.7 % — AB (ref 35.0–47.0)
HEMOGLOBIN: 11 g/dL — AB (ref 12.0–16.0)
MCH: 32.1 pg (ref 26.0–34.0)
MCHC: 34.7 g/dL (ref 32.0–36.0)
MCV: 92.5 fL (ref 80.0–100.0)
Platelets: 203 10*3/uL (ref 150–440)
RBC: 3.42 MIL/uL — ABNORMAL LOW (ref 3.80–5.20)
RDW: 14 % (ref 11.5–14.5)
WBC: 9.6 10*3/uL (ref 3.6–11.0)

## 2017-11-16 LAB — CREATININE, SERUM
CREATININE: 1.33 mg/dL — AB (ref 0.44–1.00)
GFR, EST AFRICAN AMERICAN: 46 mL/min — AB (ref >60)
GFR, EST NON AFRICAN AMERICAN: 40 mL/min — AB (ref >60)

## 2017-11-16 MED ORDER — METOCLOPRAMIDE HCL 5 MG/ML IJ SOLN
INTRAMUSCULAR | Status: AC
  Administered 2017-11-16: 10 mg via INTRAVENOUS
  Filled 2017-11-16: qty 2

## 2017-11-16 MED ORDER — FENTANYL 25 MCG/HR TD PT72
25.0000 ug | MEDICATED_PATCH | TRANSDERMAL | Status: DC
  Administered 2017-11-16: 25 ug via TRANSDERMAL
  Filled 2017-11-16: qty 1

## 2017-11-16 MED ORDER — METOCLOPRAMIDE HCL 10 MG PO TABS: 10.0000 mg | ORAL_TABLET | Freq: Three times a day (TID) | ORAL | 0 refills | Status: DC

## 2017-11-16 MED ORDER — SODIUM CHLORIDE 0.9% FLUSH: 10.0000 mL | INTRAVENOUS | Status: DC | PRN

## 2017-11-16 MED ORDER — ONDANSETRON HCL 4 MG/2ML IJ SOLN
4.0000 mg | Freq: Once | INTRAMUSCULAR | Status: AC
  Administered 2017-11-16: 4 mg via INTRAVENOUS
  Filled 2017-11-16: qty 2

## 2017-11-16 MED ORDER — ONDANSETRON HCL 4 MG/2ML IJ SOLN
4.0000 mg | Freq: Four times a day (QID) | INTRAMUSCULAR | Status: DC | PRN
  Administered 2017-11-16 – 2017-11-17 (×2): 4 mg via INTRAVENOUS
  Filled 2017-11-16 (×2): qty 2

## 2017-11-16 MED ORDER — DEXTROSE-NACL 5-0.9 % IV SOLN
INTRAVENOUS | Status: DC
  Administered 2017-11-16 – 2017-11-17 (×3): via INTRAVENOUS

## 2017-11-16 MED ORDER — DOCUSATE SODIUM 100 MG PO CAPS
200.0000 mg | ORAL_CAPSULE | Freq: Two times a day (BID) | ORAL | Status: DC
  Filled 2017-11-16 (×3): qty 2

## 2017-11-16 MED ORDER — SODIUM CHLORIDE 0.9 % IV BOLUS
1000.0000 mL | Freq: Once | INTRAVENOUS | Status: AC
  Administered 2017-11-16: 1000 mL via INTRAVENOUS

## 2017-11-16 MED ORDER — ENOXAPARIN SODIUM 40 MG/0.4ML ~~LOC~~ SOLN
40.0000 mg | SUBCUTANEOUS | Status: DC
  Administered 2017-11-18: 40 mg via SUBCUTANEOUS
  Filled 2017-11-16: qty 0.4

## 2017-11-16 MED ORDER — ONDANSETRON HCL 4 MG PO TABS: 4.0000 mg | ORAL_TABLET | Freq: Four times a day (QID) | ORAL | Status: DC | PRN

## 2017-11-16 MED ORDER — METOCLOPRAMIDE HCL 5 MG/ML IJ SOLN
10.0000 mg | Freq: Once | INTRAMUSCULAR | Status: AC
  Administered 2017-11-16: 10 mg via INTRAVENOUS

## 2017-11-16 MED ORDER — PROMETHAZINE HCL 25 MG/ML IJ SOLN
12.5000 mg | Freq: Once | INTRAMUSCULAR | Status: DC
  Filled 2017-11-16: qty 1

## 2017-11-16 MED ORDER — ACETAMINOPHEN 325 MG PO TABS: 650.0000 mg | ORAL_TABLET | Freq: Four times a day (QID) | ORAL | Status: DC | PRN

## 2017-11-16 MED ORDER — BOOST / RESOURCE BREEZE PO LIQD CUSTOM
1.0000 | Freq: Three times a day (TID) | ORAL | Status: DC
  Administered 2017-11-18: 1 via ORAL

## 2017-11-16 MED ORDER — BOOST / RESOURCE BREEZE PO LIQD CUSTOM: 1.0000 | Freq: Three times a day (TID) | ORAL | Status: DC

## 2017-11-16 MED ORDER — MORPHINE SULFATE (PF) 4 MG/ML IV SOLN
4.0000 mg | Freq: Once | INTRAVENOUS | Status: AC
  Administered 2017-11-16: 4 mg via INTRAVENOUS
  Filled 2017-11-16: qty 1

## 2017-11-16 MED ORDER — OXYCODONE HCL 5 MG PO TABS
10.0000 mg | ORAL_TABLET | Freq: Four times a day (QID) | ORAL | Status: DC | PRN
  Administered 2017-11-16 – 2017-11-17 (×3): 10 mg via ORAL
  Filled 2017-11-16 (×3): qty 2

## 2017-11-16 MED ORDER — ACETAMINOPHEN 650 MG RE SUPP: 650.0000 mg | Freq: Four times a day (QID) | RECTAL | Status: DC | PRN

## 2017-11-16 MED ORDER — ENOXAPARIN SODIUM 40 MG/0.4ML ~~LOC~~ SOLN
40.0000 mg | SUBCUTANEOUS | Status: DC
  Administered 2017-11-16: 40 mg via SUBCUTANEOUS
  Filled 2017-11-16: qty 0.4

## 2017-11-16 NOTE — Telephone Encounter (Signed)
Received call from son, [name]. Cindy Bowman is back in the ED and is going to be admitted for refractory N/V. She needs appointments for van and pump removal cancelled today. They are asking if we can come to her for pump removal today. Will contact triage nurse and Dr. Gary Fleet team. Oncology Nurse Navigator Documentation  Navigator Location: [facility] (11/16/17 0900)   )Navigator Encounter Type: Telephone (11/16/17 0900) Telephone: Incoming Call;Patient Update (11/16/17 0900)                                                  Time Spent with Patient: 15 (11/16/17 0900)

## 2017-11-16 NOTE — Plan of Care (Signed)
  Problem: Education: Goal: Knowledge of General Education information will improve Description Including pain rating scale, medication(s)/side effects and non-pharmacologic comfort measures Outcome: Progressing   

## 2017-11-16 NOTE — ED Notes (Signed)
Report given to Jessica, RN.

## 2017-11-16 NOTE — Progress Notes (Signed)
Initial Nutrition Assessment  DOCUMENTATION CODES:   Obesity unspecified  INTERVENTION:   - Boost Breeze po TID, each supplement provides 250 kcal and 9 grams of protein (please bring at room temperature)  - Will follow for diet advancement and supplement as appropriate  NUTRITION DIAGNOSIS:   Inadequate oral intake related to nausea, vomiting, cancer and cancer related treatments as evidenced by per patient/family report.  GOAL:   Patient will meet greater than or equal to 90% of their needs  MONITOR:   PO intake, Supplement acceptance, Diet advancement, Labs, I & O's, Weight trends  REASON FOR ASSESSMENT:   Malnutrition Screening Tool    ASSESSMENT:   67 year old female who presented to the ED with emesis. PMH significant for recently diagnosed pancreatic cancer receiving chemotherapy and hypertension. Pt admitted for refractory N/V.  Spoke with pt at bedside. Pt endorses continued nausea but states she thinks it is getting better.  Pt shares that she has experienced persistent N/V since Wednesday of this week when she had her first chemotherapy treatment. Pt reports that her appetite has been "non-existent" during this time period and that "I haven't been able to keep anything down."  Pt reports that prior to this Wednesday, she was eating 2-3 small meals daily but this was not normal for her. Pt shares that her appetite and PO intake decreased in April "when all of this started," referring to cancer diagnosis. One of pt's meals may include peanut butter crackers and juice or a sandwich and soup.  Pt endorses weight loss starting prior to April and reports her UBW prior to weight loss as 196-198 lbs. Pt shares that she weighed 188 lbs at the end of April. Per weight history in chart, pt has lost 6 lbs over the past 1 month. This is a 3.4% weight loss which is not significant for timeframe. Pt is at risk for malnutrition given weight loss and persistent poor appetite.  Pt  states she cannot tolerate anything cold and that she will most likely just consume hot tea and broth. Pt agreeable to trying Boost Breeze oral nutrition supplement at room temperature. RD to order.  Wt Readings from Last 10 Encounters:  11/15/17 77.6 kg  11/14/17 77.7 kg  11/06/17 81.2 kg  10/31/17 77.2 kg  10/25/17 78.5 kg  10/24/17 78.7 kg  10/18/17 80.3 kg  10/17/17 80.3 kg   Medications reviewed and include: 200 mg Colace BID, D5 in NaCl @ 100 ml/hr (provides 408 kcal/day)  Labs reviewed: creatinine 1.33 (H), hemoglobin 11.0 (L), HCT 31.7 (L)  NUTRITION - FOCUSED PHYSICAL EXAM:    Most Recent Value  Orbital Region  No depletion  Upper Arm Region  No depletion  Thoracic and Lumbar Region  No depletion  Buccal Region  No depletion  Temple Region  No depletion  Clavicle Bone Region  No depletion  Clavicle and Acromion Bone Region  Mild depletion  Scapular Bone Region  No depletion  Dorsal Hand  No depletion  Patellar Region  No depletion  Anterior Thigh Region  No depletion  Posterior Calf Region  No depletion  Hair  Reviewed  Eyes  Reviewed  Mouth  Reviewed  Skin  Reviewed  Nails  Reviewed       Diet Order:   Diet Order            Diet clear liquid Room service appropriate? Yes; Fluid consistency: Thin  Diet effective now  EDUCATION NEEDS:   Not appropriate for education at this time (N/V)  Skin:  Skin Assessment: Reviewed RN Assessment  Last BM:  unknown/PTA  Height:   Ht Readings from Last 1 Encounters:  11/15/17 5\' 3"  (1.6 m)    Weight:   Wt Readings from Last 1 Encounters:  11/15/17 77.6 kg    Ideal Body Weight:  52.27 kg  BMI:  Body mass index is 30.29 kg/m.  Estimated Nutritional Needs:   Kcal:  1900-2100  Protein:  95-110 grams  Fluid:  1.9-2.1 L    Gaynell Face, MS, RD, LDN Inpatient Clinical Dietitian Pager: [phone] Weekend/After Hours: [phone]

## 2017-11-16 NOTE — Telephone Encounter (Signed)
Thanks!  Patient is getting admitted.  I am stuck in [place] all day, do you mind doing the consult when it comes in?  She got her first dose of FOLFIRINOX on wednesday.

## 2017-11-16 NOTE — Progress Notes (Signed)
Advanced care plan.  Purpose of the Encounter: CODE STATUS  Parties in Attendance: Patient and family  Patient's Decision Capacity: Good  Subjective/Patient's story: Presented to emergency room with nausea and vomiting   Objective/Medical story Has intractable nausea and vomiting Patient unable to eat anything Patient has pancreatic cancer on chemotherapy   Goals of care determination:  Advance care directives and goals of care discussed with the patient And plan discussed Patient wants everything been as includes CPR, intubation ventilator if the need arises   CODE STATUS: Full code   Time spent discussing advanced care planning: 16 minutes

## 2017-11-16 NOTE — Telephone Encounter (Signed)
Spoke with Dr. Grayland Ormond. It is okay to disconnect 5FU now or when it finishes. Infusion is aware and will go disconnect it for her. Oncology Nurse Navigator Documentation  Navigator Location: [facility] (11/16/17 1000)   )Navigator Encounter Type: Telephone (11/16/17 1000)                                                    Time Spent with Patient: 15 (11/16/17 1000)

## 2017-11-16 NOTE — ED Provider Notes (Signed)
[facility] Emergency Department Provider Note   First MD Initiated Contact with Patient 11/16/17 0255     (approximate)  I have reviewed the triage vital signs and the nursing notes.   HISTORY  Chief Complaint Emesis    HPI Cindy Bowman is a 67 y.o. female with below list of chronic medical conditions including recently diagnosed pancreatic cancer currently receiving chemotherapy presents to the emergency department with nausea and vomiting x5 episodes today.  Patient admits to continued epigastric abdominal pain which radiates to her back.  Patient states current pain score is 9 out of 10.   Past Medical History:  Diagnosis Date  . Essential hypertension   . UTI (urinary tract infection)     Patient Active Problem List   Diagnosis Date Noted  . Pancreatic adenocarcinoma ([place]) 10/18/2017    Past Surgical History:  Procedure Laterality Date  . CESAREAN SECTION    . CHOLECYSTECTOMY    . EUS N/A 10/25/2017   Procedure: FULL UPPER ENDOSCOPIC ULTRASOUND (EUS) RADIAL;  Surgeon: Jola Schmidt, MD;  Location: [facility] ENDOSCOPY;  Service: Endoscopy;  Laterality: N/A;  . OOPHORECTOMY    . PORTA CATH INSERTION N/A 11/12/2017   Procedure: PORTA CATH INSERTION;  Surgeon: Algernon Huxley, MD;  Location: [place];  Service: Cardiovascular;  Laterality: N/A;  . TUBAL LIGATION      Prior to Admission medications   Medication Sig Start Date End Date Taking? Authorizing Provider  docusate sodium (COLACE) 100 MG capsule Take 2 capsules (200 mg total) by mouth 2 (two) times daily. 10/19/17   Dustin Flock, MD  fentaNYL (DURAGESIC - DOSED MCG/HR) 25 MCG/HR patch Place 1 patch (25 mcg total) onto the skin every 3 (three) days. 11/07/17   Lloyd Huger, MD  lidocaine-prilocaine (EMLA) cream Apply to affected area once 11/06/17   Lloyd Huger, MD  loperamide (IMODIUM A-D) 2 MG tablet Take 1 tablet (2 mg total) by mouth 4 (four) times daily as needed. Take  2 at diarrhea onset , then 1 every 2hr until 12hrs with no BM. May take 2 every 4hrs at night. If diarrhea recurs repeat. 11/06/17   Lloyd Huger, MD  losartan-hydrochlorothiazide (HYZAAR) 50-12.5 MG tablet Take 1 tablet by mouth daily. 08/27/17   [provider]  metoCLOPramide (REGLAN) 10 MG tablet Take 1 tablet (10 mg total) by mouth 3 (three) times daily with meals. 11/16/17 12/16/17  Gregor Hams, MD  ondansetron (ZOFRAN ODT) 4 MG disintegrating tablet Take 1 tablet (4 mg total) by mouth every 8 (eight) hours as needed for nausea or vomiting. 10/17/17   Earleen Newport, MD  ondansetron (ZOFRAN) 8 MG tablet Take 1 tablet (8 mg total) by mouth 2 (two) times daily as needed for refractory nausea / vomiting. 11/06/17   Lloyd Huger, MD  Oxycodone HCl 10 MG TABS Take 1 tablet (10 mg total) by mouth every 6 (six) hours as needed. 11/07/17   Lloyd Huger, MD  prochlorperazine (COMPAZINE) 10 MG tablet Take 1 tablet (10 mg total) by mouth every 6 (six) hours as needed (NAUSEA). 11/06/17   Lloyd Huger, MD    Allergies Nsaids; Ibuprofen; and Penicillins  Family History  Problem Relation Age of Onset  . Hypertension Mother   . Heart attack Father     Social History Social History   Tobacco Use  . Smoking status: Never Smoker  . Smokeless tobacco: Never Used  Substance Use Topics  .  Alcohol use: Not Currently  . Drug use: Never    Review of Systems Constitutional: No fever/chills Eyes: No visual changes. ENT: No sore throat. Cardiovascular: Denies chest pain. Respiratory: Denies shortness of breath. Gastrointestinal: Positive for abdominal pain and nausea..  No diarrhea.  No constipation. Genitourinary: Negative for dysuria. Musculoskeletal: Negative for neck pain.  Negative for back pain. Integumentary: Negative for rash. Neurological: Negative for headaches, focal weakness or  numbness.   ____________________________________________   PHYSICAL EXAM:  VITAL SIGNS: ED Triage Vitals  Enc Vitals Group     BP 11/15/17 1950 137/65     Pulse Rate 11/15/17 1950 93     Resp 11/15/17 1950 18     Temp 11/15/17 1950 98.5 F (36.9 C)     Temp Source 11/15/17 1950 Oral     SpO2 11/15/17 1950 98 %     Weight 11/15/17 1951 77.6 kg (171 lb)     Height 11/15/17 1951 1.6 m (5\' 3" )     Head Circumference --      Peak Flow --      Pain Score 11/15/17 1951 4     Pain Loc --      Pain Edu? --      Excl. in [place]? --     Constitutional: Alert and oriented. Well appearing and in no acute distress. Eyes: Conjunctivae are normal.  Head: Atraumatic. Mouth/Throat: Mucous membranes are moist. Oropharynx non-erythematous. Neck: No stridor.  Cardiovascular: Normal rate, regular rhythm. Good peripheral circulation. Grossly normal heart sounds. Respiratory: Normal respiratory effort.  No retractions. Lungs CTAB. Gastrointestinal:  No distention.   Epigastric tenderness to palpation. Musculoskeletal:  Epigastric abdominal pain with palpation. No lower extremity tenderness nor edema. No gross deformities of extremities. Neurologic:  Normal speech and language. No gross focal neurologic deficits are appreciated.  Skin:  Skin is warm, dry and intact. No rash noted. Psychiatric: Mood and affect are normal. Speech and behavior are normal.  ____________________________________________   LABS (all labs ordered are listed, but only abnormal results are displayed)  Labs Reviewed  COMPREHENSIVE METABOLIC PANEL - Abnormal; Notable for the following components:      Result Value   Glucose, Bld 113 (*)    Creatinine, Ser 1.53 (*)    GFR calc non Af Amer 34 (*)    GFR calc Af Amer 39 (*)    All other components within normal limits  CBC - Abnormal; Notable for the following components:   WBC 14.3 (*)    All other components within normal limits  LIPASE, BLOOD  URINALYSIS, COMPLETE  (UACMP) WITH MICROSCOPIC      Procedures   ____________________________________________   INITIAL IMPRESSION / ASSESSMENT AND PLAN / ED COURSE  As part of my medical decision making, I reviewed the following data within the electronic MEDICAL RECORD NUMBER   67 year old female presenting to the emergency department with above-stated history and physical exam of nausea abdominal pain and vomiting.  Patient states that pain predominantly with vomiting.  Patient given IV Zofran and subsequently IV Reglan with resolution of nausea and vomiting no pain at this time.  Suspect the patient's symptoms to be secondary to side effect of chemotherapy.  Patient discussed with Dr. Tasia Catchings oncologist regarding possibility of adding Reglan to the patient's antiemetic regimen at home to which Dr. Tasia Catchings was agreeable.     ____________________________________________  FINAL CLINICAL IMPRESSION(S) / ED DIAGNOSES  Final diagnoses:  Chemotherapy adverse reaction, initial encounter     MEDICATIONS GIVEN DURING  THIS VISIT:  Medications  morphine 4 MG/ML injection 4 mg (4 mg Intravenous Given 11/16/17 0318)  ondansetron (ZOFRAN) injection 4 mg (4 mg Intravenous Given 11/16/17 0317)  sodium chloride 0.9 % bolus 1,000 mL (0 mLs Intravenous Stopped 11/16/17 0403)  ondansetron (ZOFRAN) injection 4 mg (4 mg Intravenous Given 11/16/17 0344)  sodium chloride 0.9 % bolus 1,000 mL (0 mLs Intravenous Stopped 11/16/17 0519)  metoCLOPramide (REGLAN) injection 10 mg (10 mg Intravenous Given 11/16/17 0414)     ED Discharge Orders         Ordered    metoCLOPramide (REGLAN) 10 MG tablet  3 times daily with meals     11/16/17 0729           Note:  This document was prepared using Dragon voice recognition software and may include unintentional dictation errors.    Gregor Hams, MD 11/16/17 [phone]

## 2017-11-16 NOTE — ED Provider Notes (Signed)
Initial plan was for the patient to be discharged.  However, when she sat up with her son arrives back to the emergency department she began vomiting again.  Patient has received 2 doses of Zofran as well as 1 dose of Reglan.  Recently started chemotherapy.  Also with prolonged standing emergency department.  I feel at this point she had a prolonged period of observation as well as multiple antiemetics and will need to be admitted for refractory nausea and vomiting.  Patient as well as son are aware of this.  Signed out to Dr. Estanislado Pandy.   Orbie Pyo, MD 11/16/17 [phone]

## 2017-11-16 NOTE — ED Notes (Signed)
Fent patch and fluids requested from pharm.

## 2017-11-16 NOTE — ED Notes (Signed)
Called bed placement to assign  1221

## 2017-11-16 NOTE — ED Notes (Signed)
Dr. Clearnce Hasten informed that when pt sat up and started to move around she began vomiting again. Dr. Clearnce Hasten will go in and reassess pt. RN will continue to monitor.

## 2017-11-16 NOTE — H&P (Signed)
[place] [facility] [place] NAME: Cindy Bowman    MR#:  [id number]  DATE OF BIRTH:  04-16-1950  DATE OF ADMISSION:  11/16/2017  PRIMARY CARE PHYSICIAN: Glendon Axe, MD   REQUESTING/REFERRING PHYSICIAN:   CHIEF COMPLAINT:   Chief Complaint  Patient presents with  . Emesis    HISTORY OF PRESENT ILLNESS: Cindy Bowman  is a 67 y.o. female with a known history of pancreatic cancer, hypertension presented to the emergency room for nausea and vomiting since last 1 day.  Patient unable to eat anything or drink any fluids because of intractable nausea and vomiting.  Appears dry and dehydrated.  Patient getting chemotherapy for pancreatic cancer.  No complaints of any abdominal pain.  No history of hematemesis, hemoptysis.  Hospitalist service was consulted for further care.  PAST MEDICAL HISTORY:   Past Medical History:  Diagnosis Date  . Essential hypertension   . Pancreatic cancer ([place])   . UTI (urinary tract infection)     PAST SURGICAL HISTORY:  Past Surgical History:  Procedure Laterality Date  . CESAREAN SECTION    . CHOLECYSTECTOMY    . EUS N/A 10/25/2017   Procedure: FULL UPPER ENDOSCOPIC ULTRASOUND (EUS) RADIAL;  Surgeon: Jola Schmidt, MD;  Location: [facility] ENDOSCOPY;  Service: Endoscopy;  Laterality: N/A;  . OOPHORECTOMY    . PORTA CATH INSERTION N/A 11/12/2017   Procedure: PORTA CATH INSERTION;  Surgeon: Algernon Huxley, MD;  Location: [facility];  Service: Cardiovascular;  Laterality: N/A;  . TUBAL LIGATION      SOCIAL HISTORY:  Social History   Tobacco Use  . Smoking status: Never Smoker  . Smokeless tobacco: Never Used  Substance Use Topics  . Alcohol use: Not Currently    FAMILY HISTORY:  Family History  Problem Relation Age of Onset  . Hypertension Mother   . Heart attack Father     DRUG ALLERGIES:  Allergies  Allergen Reactions  . Nsaids Other (See Comments)    Decreased GFR  . Ibuprofen     Lowers  kidney function  . Penicillins     Yeast infection  Has patient had a PCN reaction causing immediate rash, facial/tongue/throat swelling, SOB or lightheadedness with hypotension: No Has patient had a PCN reaction causing severe rash involving mucus membranes or skin necrosis: No Has patient had a PCN reaction that required hospitalization: No Has patient had a PCN reaction occurring within the last 10 years: No If all of the above answers are "NO", then may proceed with Cephalosporin use.     REVIEW OF SYSTEMS:   CONSTITUTIONAL: No fever,has  fatigue and weakness.  EYES: No blurred or double vision.  EARS, NOSE, AND THROAT: No tinnitus or ear pain.  RESPIRATORY: No cough, shortness of breath, wheezing or hemoptysis.  CARDIOVASCULAR: No chest pain, orthopnea, edema.  GASTROINTESTINAL: Has nausea, vomiting,  No diarrhea or abdominal pain.  GENITOURINARY: No dysuria, hematuria.  ENDOCRINE: No polyuria, nocturia,  HEMATOLOGY: No anemia, easy bruising or bleeding SKIN: No rash or lesion. MUSCULOSKELETAL: No joint pain or arthritis.   NEUROLOGIC: No tingling, numbness, weakness.  PSYCHIATRY: No anxiety or depression.   MEDICATIONS AT HOME:  Prior to Admission medications   Medication Sig Start Date End Date Taking? Authorizing Provider  fentaNYL (DURAGESIC - DOSED MCG/HR) 25 MCG/HR patch Place 1 patch (25 mcg total) onto the skin every 3 (three) days. 11/07/17  Yes Lloyd Huger, MD  lidocaine-prilocaine (EMLA) cream Apply to  affected area once 11/06/17  Yes Finnegan, Kathlene November, MD  losartan-hydrochlorothiazide (HYZAAR) 50-12.5 MG tablet Take 1 tablet by mouth daily. 08/27/17  Yes [provider]  ondansetron (ZOFRAN ODT) 4 MG disintegrating tablet Take 1 tablet (4 mg total) by mouth every 8 (eight) hours as needed for nausea or vomiting. 10/17/17  Yes Earleen Newport, MD  ondansetron (ZOFRAN) 8 MG tablet Take 1 tablet (8 mg total) by mouth 2 (two) times daily as needed  for refractory nausea / vomiting. 11/06/17  Yes Lloyd Huger, MD  Oxycodone HCl 10 MG TABS Take 1 tablet (10 mg total) by mouth every 6 (six) hours as needed. 11/07/17  Yes Lloyd Huger, MD  prochlorperazine (COMPAZINE) 10 MG tablet Take 1 tablet (10 mg total) by mouth every 6 (six) hours as needed (NAUSEA). 11/06/17  Yes Lloyd Huger, MD  docusate sodium (COLACE) 100 MG capsule Take 2 capsules (200 mg total) by mouth 2 (two) times daily. Patient not taking: Reported on 11/16/2017 10/19/17   Dustin Flock, MD  loperamide (IMODIUM A-D) 2 MG tablet Take 1 tablet (2 mg total) by mouth 4 (four) times daily as needed. Take 2 at diarrhea onset , then 1 every 2hr until 12hrs with no BM. May take 2 every 4hrs at night. If diarrhea recurs repeat. Patient not taking: Reported on 11/16/2017 11/06/17   Lloyd Huger, MD  metoCLOPramide (REGLAN) 10 MG tablet Take 1 tablet (10 mg total) by mouth 3 (three) times daily with meals. 11/16/17 12/16/17  Gregor Hams, MD      PHYSICAL EXAMINATION:   VITAL SIGNS: Blood pressure 140/74, pulse 94, temperature 98.1 F (36.7 C), temperature source Oral, resp. rate 16, height 5\' 3"  (1.6 m), weight 77.6 kg, SpO2 100 %.  GENERAL:  67 y.o.-year-old patient lying in the bed with no acute distress.  EYES: Pupils equal, round, reactive to light and accommodation. No scleral icterus. Extraocular muscles intact.  HEENT: Head atraumatic, normocephalic. Oropharynx dry and nasopharynx clear.  NECK:  Supple, no jugular venous distention. No thyroid enlargement, no tenderness.  LUNGS: Normal breath sounds bilaterally, no wheezing, rales,rhonchi or crepitation. No use of accessory muscles of respiration.  CARDIOVASCULAR: S1, S2 normal. No murmurs, rubs, or gallops.  ABDOMEN: Soft, nontender, nondistended. Bowel sounds present. No organomegaly or mass.  EXTREMITIES: No pedal edema, cyanosis, or clubbing.  NEUROLOGIC: Cranial nerves II through XII are intact.  Muscle strength 5/5 in all extremities. Sensation intact. Gait not checked.  PSYCHIATRIC: The patient is alert and oriented x 3.  SKIN: No obvious rash, lesion, or ulcer.   LABORATORY PANEL:   CBC Recent Labs  Lab 11/14/17 0819 11/15/17 1954  WBC 11.6* 14.3*  HGB 11.3* 12.3  HCT 33.2* 35.4  PLT 270 273  MCV 93.8 92.6  MCH 32.0 32.2  MCHC 34.2 34.7  RDW 14.2 14.1  LYMPHSABS 3.4  --   MONOABS 0.9  --   EOSABS 0.2  --   BASOSABS 0.0  --    ------------------------------------------------------------------------------------------------------------------  Chemistries  Recent Labs  Lab 11/14/17 0819 11/15/17 1954  NA 145 140  K 3.6 4.4  CL 107 106  CO2 24 25  GLUCOSE 141* 113*  BUN 14 19  CREATININE 1.51* 1.53*  CALCIUM 9.2 9.1  AST 29 32  ALT 21 27  ALKPHOS 97 94  BILITOT 0.6 0.7   ------------------------------------------------------------------------------------------------------------------ estimated creatinine clearance is 34.7 mL/min (A) (by C-G formula based on SCr of 1.53 mg/dL (H)). ------------------------------------------------------------------------------------------------------------------ No  results for input(s): TSH, T4TOTAL, T3FREE, THYROIDAB in the last 72 hours.  Invalid input(s): FREET3   Coagulation profile No results for input(s): INR, PROTIME in the last 168 hours. ------------------------------------------------------------------------------------------------------------------- No results for input(s): DDIMER in the last 72 hours. -------------------------------------------------------------------------------------------------------------------  Cardiac Enzymes No results for input(s): CKMB, TROPONINI, MYOGLOBIN in the last 168 hours.  Invalid input(s): CK ------------------------------------------------------------------------------------------------------------------ Invalid input(s):  POCBNP  ---------------------------------------------------------------------------------------------------------------  Urinalysis    Component Value Date/Time   COLORURINE AMBER (A) 10/18/2017 2035   APPEARANCEUR CLOUDY (A) 10/18/2017 2035   LABSPEC 1.033 (H) 10/18/2017 2035   PHURINE 5.0 10/18/2017 2035   GLUCOSEU NEGATIVE 10/18/2017 2035   HGBUR NEGATIVE 10/18/2017 2035   BILIRUBINUR NEGATIVE 10/18/2017 2035   KETONESUR 5 (A) 10/18/2017 2035   PROTEINUR 30 (A) 10/18/2017 2035   NITRITE NEGATIVE 10/18/2017 2035   LEUKOCYTESUR TRACE (A) 10/18/2017 2035     RADIOLOGY: No results found.  EKG: No orders found for this or any previous visit.  IMPRESSION AND PLAN:  66 year old female patient with history of pancreatic cancer on chemotherapy, hypertension presented to emergency room for nausea and vomiting  -Intractable nausea and vomiting Secondary to chemotherapy Antiemetics aggressively  -Dehydration IV hydration with normal saline  -Pancreatic cancer Outpatient oncology follow-up 5-FU will be finished and pump will be disconnected As per oncology recommendations  -DVT prophylaxis subcu Lovenox daily  All the records are reviewed and case discussed with ED provider. Management plans discussed with the patient, family and they are in agreement.  CODE STATUS:Full code Code Status History    Date Active Date Inactive Code Status Order ID Comments User Context   10/18/2017 1500 10/19/2017 1830 Full Code [id number]  Henreitta Leber, MD ED    Advance Directive Documentation     Most Recent Value  Type of Advance Directive  Living will  Pre-existing out of facility DNR order (yellow form or pink MOST form)  -  "MOST" Form in Place?  -       TOTAL TIME TAKING CARE OF THIS PATIENT: 52 minutes.    Saundra Shelling M.D on 11/16/2017 at 11:12 AM  Between 7am to 6pm - Pager - [phone]  After 6pm go to [email or web address] - password [email or web address] [facility]  [place]  [facility]  Office  [phone]  CC: Primary care physician; Glendon Axe, MD

## 2017-11-16 NOTE — ED Notes (Signed)
Pt actively vomiting. MD at bedside.

## 2017-11-17 LAB — BASIC METABOLIC PANEL
Anion gap: 5 (ref 5–15)
BUN: 11 mg/dL (ref 8–23)
CO2: 24 mmol/L (ref 22–32)
Calcium: 8.1 mg/dL — ABNORMAL LOW (ref 8.9–10.3)
Chloride: 111 mmol/L (ref 98–111)
Creatinine, Ser: 1.24 mg/dL — ABNORMAL HIGH (ref 0.44–1.00)
GFR, EST AFRICAN AMERICAN: 51 mL/min — AB (ref >60)
GFR, EST NON AFRICAN AMERICAN: 44 mL/min — AB (ref >60)
GLUCOSE: 127 mg/dL — AB (ref 70–99)
POTASSIUM: 3.6 mmol/L (ref 3.5–5.1)
SODIUM: 140 mmol/L (ref 135–145)

## 2017-11-17 LAB — CBC
HCT: 32 % — ABNORMAL LOW (ref 35.0–47.0)
HEMOGLOBIN: 10.9 g/dL — AB (ref 12.0–16.0)
MCH: 31.7 pg (ref 26.0–34.0)
MCHC: 33.9 g/dL (ref 32.0–36.0)
MCV: 93.3 fL (ref 80.0–100.0)
Platelets: 225 10*3/uL (ref 150–440)
RBC: 3.43 MIL/uL — AB (ref 3.80–5.20)
RDW: 14.1 % (ref 11.5–14.5)
WBC: 8 10*3/uL (ref 3.6–11.0)

## 2017-11-17 MED ORDER — OXYCODONE HCL 5 MG PO TABS
10.0000 mg | ORAL_TABLET | ORAL | Status: DC | PRN
  Administered 2017-11-17 – 2017-11-18 (×5): 10 mg via ORAL
  Filled 2017-11-17 (×5): qty 2

## 2017-11-17 MED ORDER — PROMETHAZINE HCL 25 MG PO TABS
12.5000 mg | ORAL_TABLET | Freq: Three times a day (TID) | ORAL | Status: DC
  Administered 2017-11-17 – 2017-11-18 (×3): 12.5 mg via ORAL
  Filled 2017-11-17 (×4): qty 1

## 2017-11-17 MED ORDER — HYDROCORTISONE 2.5 % RE CREA
TOPICAL_CREAM | Freq: Two times a day (BID) | RECTAL | Status: DC
  Administered 2017-11-17: 18:00:00 via RECTAL
  Filled 2017-11-17: qty 28.35

## 2017-11-17 MED ORDER — LOSARTAN POTASSIUM 50 MG PO TABS
50.0000 mg | ORAL_TABLET | Freq: Every day | ORAL | Status: DC
  Administered 2017-11-17 – 2017-11-18 (×2): 50 mg via ORAL
  Filled 2017-11-17 (×2): qty 1

## 2017-11-17 NOTE — Progress Notes (Signed)
Assumed care of patient at 1300. Patient with complaints of nausea but no vomiting. Fentanyl patch in place. Patient ambulating around the room unassisted.

## 2017-11-17 NOTE — Care Management Obs Status (Signed)
[place] NOTIFICATION   Patient Details  Name: Cindy Bowman MRN: [id number] Date of Birth: 1950-03-06   Medicare Observation Status Notification Given:  Yes    Averill Pons A Ollie Delano, RN 11/17/2017, 2:38 PM

## 2017-11-17 NOTE — Progress Notes (Signed)
Informed Dr. Judd Gaudier about patient's request for hemorroid cream and well she wanted to know why she was no longer on her home dose of losartan. MD to place.

## 2017-11-17 NOTE — Progress Notes (Signed)
[place] [facility] [place] NAME: Cindy Bowman    MR#:  [id number]  DATE OF BIRTH:  [birth year]  SUBJECTIVE:  CHIEF COMPLAINT:   Chief Complaint  Patient presents with  . Emesis   S/p chemo. Came with nausea, still have persistent nausea with meals.  REVIEW OF SYSTEMS:  CONSTITUTIONAL: No fever, fatigue or weakness.  EYES: No blurred or double vision.  EARS, NOSE, AND THROAT: No tinnitus or ear pain.  RESPIRATORY: No cough, shortness of breath, wheezing or hemoptysis.  CARDIOVASCULAR: No chest pain, orthopnea, edema.  GASTROINTESTINAL: have nausea,no vomiting, diarrhea or abdominal pain.  GENITOURINARY: No dysuria, hematuria.  ENDOCRINE: No polyuria, nocturia,  HEMATOLOGY: No anemia, easy bruising or bleeding SKIN: No rash or lesion. MUSCULOSKELETAL: No joint pain or arthritis.   NEUROLOGIC: No tingling, numbness, weakness.  PSYCHIATRY: No anxiety or depression.   ROS  DRUG ALLERGIES:   Allergies  Allergen Reactions  . Nsaids Other (See Comments)    Decreased GFR  . Ibuprofen     Lowers kidney function  . Penicillins     Yeast infection  Has patient had a PCN reaction causing immediate rash, facial/tongue/throat swelling, SOB or lightheadedness with hypotension: No Has patient had a PCN reaction causing severe rash involving mucus membranes or skin necrosis: No Has patient had a PCN reaction that required hospitalization: No Has patient had a PCN reaction occurring within the last 10 years: No If all of the above answers are "NO", then may proceed with Cephalosporin use.     VITALS:  Blood pressure 135/68, pulse 74, temperature 97.7 F (36.5 C), temperature source Oral, resp. rate 20, height 5\' 3"  (1.6 m), weight 77.6 kg, SpO2 100 %.  PHYSICAL EXAMINATION:  GENERAL:  68 y.o.-year-old patient lying in the bed with no acute distress.  EYES: Pupils equal, round, reactive to light and accommodation. No scleral icterus. Extraocular  muscles intact.  HEENT: Head atraumatic, normocephalic. Oropharynx and nasopharynx clear.  NECK:  Supple, no jugular venous distention. No thyroid enlargement, no tenderness.  LUNGS: Normal breath sounds bilaterally, no wheezing, rales,rhonchi or crepitation. No use of accessory muscles of respiration.  CARDIOVASCULAR: S1, S2 normal. No murmurs, rubs, or gallops.  ABDOMEN: Soft, nontender, nondistended. Bowel sounds present. No organomegaly or mass.  EXTREMITIES: No pedal edema, cyanosis, or clubbing.  NEUROLOGIC: Cranial nerves II through XII are intact. Muscle strength 5/5 in all extremities. Sensation intact. Gait not checked.  PSYCHIATRIC: The patient is alert and oriented x 3.  SKIN: No obvious rash, lesion, or ulcer.   Physical Exam LABORATORY PANEL:   CBC Recent Labs  Lab 11/17/17 0522  WBC 8.0  HGB 10.9*  HCT 32.0*  PLT 225   ------------------------------------------------------------------------------------------------------------------  Chemistries  Recent Labs  Lab 11/15/17 1954  11/17/17 0522  NA 140  --  140  K 4.4  --  3.6  CL 106  --  111  CO2 25  --  24  GLUCOSE 113*  --  127*  BUN 19  --  11  CREATININE 1.53*   < > 1.24*  CALCIUM 9.1  --  8.1*  AST 32  --   --   ALT 27  --   --   ALKPHOS 94  --   --   BILITOT 0.7  --   --    < > = values in this interval not displayed.   ------------------------------------------------------------------------------------------------------------------  Cardiac Enzymes No results for input(s): TROPONINI in the last 168  hours. ------------------------------------------------------------------------------------------------------------------  RADIOLOGY:  No results found.  ASSESSMENT AND PLAN:   Active Problems:   Nausea & vomiting  67 year old female patient with history of pancreatic cancer on chemotherapy, hypertension presented to emergency room for nausea and vomiting  -Intractable nausea and  vomiting Secondary to chemotherapy Antiemetics aggressively - will add scheduled before each meal  -Dehydration IV hydration with normal saline  -Pancreatic cancer Outpatient oncology follow-up 5-FU will be finished and pump will be disconnected As per oncology recommendations  -DVT prophylaxis subcu Lovenox daily    All the records are reviewed and case discussed with Care Management/Social Workerr. Management plans discussed with the patient, family and they are in agreement.  CODE STATUS: Full.  TOTAL TIME TAKING CARE OF THIS PATIENT: 35 minutes.     POSSIBLE D/C IN 1-2 DAYS, DEPENDING ON CLINICAL CONDITION.   Vaughan Basta M.D on 11/17/2017   Between 7am to 6pm - Pager - [phone]  After 6pm go to [email or web address] - password [email or web address] [facility]  Office  [phone]  CC: Primary care physician; Glendon Axe, MD  Note: This dictation was prepared with Dragon dictation along with smaller phrase technology. Any transcriptional errors that result from this process are unintentional.

## 2017-11-18 MED ORDER — LOSARTAN POTASSIUM 50 MG PO TABS: 50.0000 mg | ORAL_TABLET | Freq: Every day | ORAL | 0 refills | Status: DC

## 2017-11-18 MED ORDER — PROMETHAZINE HCL 12.5 MG PO TABS: 12.5000 mg | ORAL_TABLET | Freq: Three times a day (TID) | ORAL | 0 refills | Status: DC

## 2017-11-18 NOTE — Discharge Summary (Signed)
[place] [facility] [place] NAME: Cindy Bowman    MR#:  [id number]  DATE OF BIRTH:  1950/07/16  DATE OF ADMISSION:  11/16/2017 ADMITTING PHYSICIAN: Saundra Shelling, MD  DATE OF DISCHARGE: 11/18/2017   PRIMARY CARE PHYSICIAN: Glendon Axe, MD    ADMISSION DIAGNOSIS:  Chemotherapy adverse reaction, initial encounter [T45.1X5A]  DISCHARGE DIAGNOSIS:  Active Problems:   Nausea & vomiting   SECONDARY DIAGNOSIS:   Past Medical History:  Diagnosis Date  . Essential hypertension   . Pancreatic cancer ([place])   . UTI (urinary tract infection)     HOSPITAL COURSE:   67 year old female patient with history of pancreatic cancer on chemotherapy, hypertension presented to emergency room for nausea and vomiting  -Intractable nausea and vomiting Secondary to chemotherapy Antiemetics aggressively - will add scheduled before each meal Patient felt much better with scheduled antiemetic before meals, tolerated her diet very well.  Will give prescriptions for scheduled Phenergan before each meal at home.  -Dehydration IV hydration with normal saline  -Pancreatic cancer Outpatient oncology follow-up 5-FU will be finished and pump will be disconnected As per oncology recommendations  -DVT prophylaxis subcu Lovenox daily   DISCHARGE CONDITIONS:   Stable CONSULTS OBTAINED:    DRUG ALLERGIES:   Allergies  Allergen Reactions  . Nsaids Other (See Comments)    Decreased GFR  . Ibuprofen     Lowers kidney function  . Penicillins     Yeast infection  Has patient had a PCN reaction causing immediate rash, facial/tongue/throat swelling, SOB or lightheadedness with hypotension: No Has patient had a PCN reaction causing severe rash involving mucus membranes or skin necrosis: No Has patient had a PCN reaction that required hospitalization: No Has patient had a PCN reaction occurring within the last 10 years: No If all of the above  answers are "NO", then may proceed with Cephalosporin use.     DISCHARGE MEDICATIONS:   Allergies as of 11/18/2017      Reactions   Nsaids Other (See Comments)   Decreased GFR   Ibuprofen    Lowers kidney function   Penicillins    Yeast infection Has patient had a PCN reaction causing immediate rash, facial/tongue/throat swelling, SOB or lightheadedness with hypotension: No Has patient had a PCN reaction causing severe rash involving mucus membranes or skin necrosis: No Has patient had a PCN reaction that required hospitalization: No Has patient had a PCN reaction occurring within the last 10 years: No If all of the above answers are "NO", then may proceed with Cephalosporin use.      Medication List    STOP taking these medications   loperamide 2 MG tablet Commonly known as:  IMODIUM A-D   losartan-hydrochlorothiazide 50-12.5 MG tablet Commonly known as:  HYZAAR   ondansetron 8 MG tablet Commonly known as:  ZOFRAN     TAKE these medications   docusate sodium 100 MG capsule Commonly known as:  COLACE Take 2 capsules (200 mg total) by mouth 2 (two) times daily.   fentaNYL 25 MCG/HR patch Commonly known as:  DURAGESIC - dosed mcg/hr Place 1 patch (25 mcg total) onto the skin every 3 (three) days.   lidocaine-prilocaine cream Commonly known as:  EMLA Apply to affected area once   losartan 50 MG tablet Commonly known as:  COZAAR Take 1 tablet (50 mg total) by mouth daily. Start taking on:  11/19/2017   metoCLOPramide 10 MG tablet Commonly known as:  REGLAN Take 1  tablet (10 mg total) by mouth 3 (three) times daily with meals.   ondansetron 4 MG disintegrating tablet Commonly known as:  ZOFRAN-ODT Take 1 tablet (4 mg total) by mouth every 8 (eight) hours as needed for nausea or vomiting.   Oxycodone HCl 10 MG Tabs Take 1 tablet (10 mg total) by mouth every 6 (six) hours as needed.   prochlorperazine 10 MG tablet Commonly known as:  COMPAZINE Take 1 tablet (10  mg total) by mouth every 6 (six) hours as needed (NAUSEA).   promethazine 12.5 MG tablet Commonly known as:  PHENERGAN Take 1 tablet (12.5 mg total) by mouth 3 (three) times daily before meals.        DISCHARGE INSTRUCTIONS:    Follow with primary care center and with cancer center in the next 1 to 2 weeks.  If you experience worsening of your admission symptoms, develop shortness of breath, life threatening emergency, suicidal or homicidal thoughts you must seek medical attention immediately by calling 911 or calling your MD immediately  if symptoms less severe.  You Must read complete instructions/literature along with all the possible adverse reactions/side effects for all the Medicines you take and that have been prescribed to you. Take any new Medicines after you have completely understood and accept all the possible adverse reactions/side effects.   Please note  You were cared for by a hospitalist during your hospital stay. If you have any questions about your discharge medications or the care you received while you were in the hospital after you are discharged, you can call the unit and asked to speak with the hospitalist on call if the hospitalist that took care of you is not available. Once you are discharged, your primary care physician will handle any further medical issues. Please note that NO REFILLS for any discharge medications will be authorized once you are discharged, as it is imperative that you return to your primary care physician (or establish a relationship with a primary care physician if you do not have one) for your aftercare needs so that they can reassess your need for medications and monitor your lab values.    Today   CHIEF COMPLAINT:   Chief Complaint  Patient presents with  . Emesis    HISTORY OF PRESENT ILLNESS:  Cindy Bowman  is a 67 y.o. female with a known history of pancreatic cancer, hypertension presented to the emergency room for nausea and  vomiting since last 1 day.  Patient unable to eat anything or drink any fluids because of intractable nausea and vomiting.  Appears dry and dehydrated.  Patient getting chemotherapy for pancreatic cancer.  No complaints of any abdominal pain.  No history of hematemesis, hemoptysis.  Hospitalist service was consulted for further care.  VITAL SIGNS:  Blood pressure 139/63, pulse 74, temperature 98.3 F (36.8 C), temperature source Oral, resp. rate 18, height 5\' 3"  (1.6 m), weight 77.6 kg, SpO2 100 %.  I/O:    Intake/Output Summary (Last 24 hours) at 11/18/2017 1403 Last data filed at 11/18/2017 1300 Gross per 24 hour  Intake 2056.07 ml  Output -  Net 2056.07 ml    PHYSICAL EXAMINATION:  GENERAL:  67 y.o.-year-old patient lying in the bed with no acute distress.  EYES: Pupils equal, round, reactive to light and accommodation. No scleral icterus. Extraocular muscles intact.  HEENT: Head atraumatic, normocephalic. Oropharynx and nasopharynx clear.  NECK:  Supple, no jugular venous distention. No thyroid enlargement, no tenderness.  LUNGS: Normal breath sounds bilaterally,  no wheezing, rales,rhonchi or crepitation. No use of accessory muscles of respiration.  CARDIOVASCULAR: S1, S2 normal. No murmurs, rubs, or gallops.  ABDOMEN: Soft, non-tender, non-distended. Bowel sounds present. No organomegaly or mass.  EXTREMITIES: No pedal edema, cyanosis, or clubbing.  NEUROLOGIC: Cranial nerves II through XII are intact. Muscle strength 5/5 in all extremities. Sensation intact. Gait not checked.  PSYCHIATRIC: The patient is alert and oriented x 3.  SKIN: No obvious rash, lesion, or ulcer.   DATA REVIEW:   CBC Recent Labs  Lab 11/17/17 0522  WBC 8.0  HGB 10.9*  HCT 32.0*  PLT 225    Chemistries  Recent Labs  Lab 11/15/17 1954  11/17/17 0522  NA 140  --  140  K 4.4  --  3.6  CL 106  --  111  CO2 25  --  24  GLUCOSE 113*  --  127*  BUN 19  --  11  CREATININE 1.53*   < > 1.24*   CALCIUM 9.1  --  8.1*  AST 32  --   --   ALT 27  --   --   ALKPHOS 94  --   --   BILITOT 0.7  --   --    < > = values in this interval not displayed.    Cardiac Enzymes No results for input(s): TROPONINI in the last 168 hours.  Microbiology Results  Results for orders placed or performed during the hospital encounter of 10/18/17  Urine culture     Status: None   Collection Time: 10/18/17  8:35 PM  Result Value Ref Range Status   Specimen Description   Final    URINE, RANDOM Performed at [facility], [address], [place], [place] [address]    Special Requests   Final    NONE Performed at [facility], [address], [place], [place] [address]    Culture   Final    NO GROWTH Performed at [facility], [place] [address] [place], [place] [address]    Report Status 10/20/2017 FINAL  Final    RADIOLOGY:  No results found.  EKG:  No orders found for this or any previous visit.    Management plans discussed with the patient, family and they are in agreement.  CODE STATUS: Full    Code Status Orders  (From admission, onward)         Start     Ordered   11/16/17 1130  Full code  Continuous     11/16/17 1129        Code Status History    Date Active Date Inactive Code Status Order ID Comments User Context   10/18/2017 1500 10/19/2017 1830 Full Code [id number]  Henreitta Leber, MD ED    Advance Directive Documentation     Most Recent Value  Type of Advance Directive  Living will  Pre-existing out of facility DNR order (yellow form or pink MOST form)  -  "MOST" Form in Place?  -      TOTAL TIME TAKING CARE OF THIS PATIENT: 35 minutes.    Vaughan Basta M.D on 11/18/2017 at 2:03 PM  Between 7am to 6pm - Pager - [phone]  After 6pm go to [email or web address] - password [email or web address] [facility]  Office  [phone]  CC: Primary care physician; Glendon Axe, MD   Note: This dictation was prepared with  Dragon dictation along with smaller phrase technology. Any transcriptional errors that  result from this process are unintentional.

## 2017-11-19 NOTE — Progress Notes (Signed)
[place]  Telephone:([phone]) [phone] Fax:(336) [phone]  ID: Cindy Bowman OB: 12-Jun-1950  MR#: [id number]  YHC#:[id number]  Patient Care Team: Glendon Axe, MD as PCP - General (Internal Medicine) Clent Jacks, RN as Registered Nurse  CHIEF COMPLAINT: Clinical stage Ib pancreatic adenocarcinoma.  INTERVAL HISTORY: Patient returns to clinic today for further evaluation and to assess her toleration of cycle 1 of neoadjuvant FOLFIRINOX.  She had a difficult time with treatment with intractable nausea vomiting for 1 to 2 days requiring admission to the hospital.  She feels significantly better and is nearly back to her baseline.  Her pain is better controlled on her current narcotic regimen.  She denies any fevers. She has a poor appetite, but denies weight loss.  She denies any chest pain or shortness of breath.  She has no nausea, vomiting, constipation, or diarrhea.  She has no urinary complaints.  Patient offers no further specific complaints today.  REVIEW OF SYSTEMS:   Review of Systems  Constitutional: Negative.  Negative for fever, malaise/fatigue and weight loss.  Respiratory: Negative.  Negative for cough and shortness of breath.   Cardiovascular: Negative.  Negative for chest pain and leg swelling.  Gastrointestinal: Positive for abdominal pain. Negative for blood in stool, melena, nausea and vomiting.  Genitourinary: Negative.  Negative for dysuria.  Musculoskeletal: Negative.  Negative for back pain.  Skin: Negative.  Negative for rash.  Neurological: Negative.  Negative for dizziness, focal weakness, weakness and headaches.  Psychiatric/Behavioral: Negative.  The patient is not nervous/anxious.     As per HPI. Otherwise, a complete review of systems is negative.  PAST MEDICAL HISTORY: Past Medical History:  Diagnosis Date  . Essential hypertension   . Pancreatic cancer ([place])   . UTI (urinary tract infection)     PAST SURGICAL HISTORY: Past  Surgical History:  Procedure Laterality Date  . CESAREAN SECTION    . CHOLECYSTECTOMY    . EUS N/A 10/25/2017   Procedure: FULL UPPER ENDOSCOPIC ULTRASOUND (EUS) RADIAL;  Surgeon: Jola Schmidt, MD;  Location: [facility] ENDOSCOPY;  Service: Endoscopy;  Laterality: N/A;  . OOPHORECTOMY    . PORTA CATH INSERTION N/A 11/12/2017   Procedure: PORTA CATH INSERTION;  Surgeon: Algernon Huxley, MD;  Location: [place] [facility];  Service: Cardiovascular;  Laterality: N/A;  . TUBAL LIGATION      FAMILY HISTORY: Family History  Problem Relation Age of Onset  . Hypertension Mother        deceased 26  . Heart attack Father        deceased late 55s    ADVANCED DIRECTIVES (Y/N):  N  HEALTH MAINTENANCE: Social History   Tobacco Use  . Smoking status: Never Smoker  . Smokeless tobacco: Never Used  Substance Use Topics  . Alcohol use: Not Currently  . Drug use: Never     Colonoscopy:  PAP:  Bone density:  Lipid panel:  Allergies  Allergen Reactions  . Nsaids Other (See Comments)    Decreased GFR  . Ibuprofen     Lowers kidney function  . Penicillins     Yeast infection  Has patient had a PCN reaction causing immediate rash, facial/tongue/throat swelling, SOB or lightheadedness with hypotension: No Has patient had a PCN reaction causing severe rash involving mucus membranes or skin necrosis: No Has patient had a PCN reaction that required hospitalization: No Has patient had a PCN reaction occurring within the last 10 years: No If all of the above answers are "NO",  then may proceed with Cephalosporin use.     Current Outpatient Medications  Medication Sig Dispense Refill  . fentaNYL (DURAGESIC - DOSED MCG/HR) 25 MCG/HR patch Place 1 patch (25 mcg total) onto the skin every 3 (three) days. 10 patch 0  . lidocaine-prilocaine (EMLA) cream Apply to affected area once 30 g 3  . losartan (COZAAR) 50 MG tablet Take 1 tablet (50 mg total) by mouth daily. 30 tablet 0  . docusate sodium (COLACE)  100 MG capsule Take 2 capsules (200 mg total) by mouth 2 (two) times daily. (Patient not taking: Reported on 11/21/2017) 30 capsule 0  . metoCLOPramide (REGLAN) 10 MG tablet Take 1 tablet (10 mg total) by mouth 3 (three) times daily with meals. (Patient not taking: Reported on 11/21/2017) 90 tablet 0  . ondansetron (ZOFRAN ODT) 4 MG disintegrating tablet Take 1 tablet (4 mg total) by mouth every 8 (eight) hours as needed for nausea or vomiting. (Patient not taking: Reported on 11/21/2017) 20 tablet 0  . Oxycodone HCl 10 MG TABS Take 1 tablet (10 mg total) by mouth every 6 (six) hours as needed. 60 tablet 0  . prochlorperazine (COMPAZINE) 10 MG tablet Take 1 tablet (10 mg total) by mouth every 6 (six) hours as needed (NAUSEA). (Patient not taking: Reported on 11/21/2017) 60 tablet 2  . promethazine (PHENERGAN) 12.5 MG tablet Take 1 tablet (12.5 mg total) by mouth 3 (three) times daily before meals. (Patient not taking: Reported on 11/21/2017) 30 tablet 0   No current facility-administered medications for this visit.     OBJECTIVE: Vitals:   11/21/17 0918  BP: 137/82  Pulse: 91  Resp: 18  Temp: (!) 96.5 F (35.8 C)     Body mass index is 29.51 kg/m.    ECOG FS:0 - Asymptomatic  General: Well-developed, well-nourished, no acute distress. Eyes: Pink conjunctiva, anicteric sclera. HEENT: Normocephalic, moist mucous membranes. Lungs: Clear to auscultation bilaterally. Heart: Regular rate and rhythm. No rubs, murmurs, or gallops. Abdomen: Soft, nontender, nondistended. No organomegaly noted, normoactive bowel sounds. Musculoskeletal: No edema, cyanosis, or clubbing. Neuro: Alert, answering all questions appropriately. Cranial nerves grossly intact. Skin: No rashes or petechiae noted. Psych: Normal affect.   LAB RESULTS:  Lab Results  Component Value Date   NA 143 11/21/2017   K 4.4 11/21/2017   CL 111 11/21/2017   CO2 23 11/21/2017   GLUCOSE 118 (H) 11/21/2017   BUN 15 11/21/2017    CREATININE 1.25 (H) 11/21/2017   CALCIUM 8.9 11/21/2017   PROT 7.3 11/21/2017   ALBUMIN 3.6 11/21/2017   AST 21 11/21/2017   ALT 23 11/21/2017   ALKPHOS 89 11/21/2017   BILITOT 0.5 11/21/2017   GFRNONAA 43 (L) 11/21/2017   GFRAA 50 (L) 11/21/2017    Lab Results  Component Value Date   WBC 5.3 11/21/2017   NEUTROABS 2.8 11/21/2017   HGB 11.1 (L) 11/21/2017   HCT 32.4 (L) 11/21/2017   MCV 92.8 11/21/2017   PLT 113 (L) 11/21/2017     STUDIES: Nm Pet Image Initial (pi) Skull Base To Thigh  Result Date: 10/30/2017 CLINICAL DATA:  Initial treatment strategy for pancreatic adenocarcinoma. EXAM: NUCLEAR MEDICINE PET SKULL BASE TO THIGH TECHNIQUE: 9.42 mCi F-18 FDG was injected intravenously. Full-ring PET imaging was performed from the skull base to thigh after the radiotracer. CT data was obtained and used for attenuation correction and anatomic localization. Fasting blood glucose: 119 mg/dl COMPARISON:  CT scan 10/17/2017 FINDINGS: Mediastinal blood pool activity: SUV max  2.99 NECK: No hypermetabolic lymph nodes in the neck. Incidental CT findings: none CHEST: No hypermetabolic mediastinal or hilar nodes. No suspicious pulmonary nodules on the CT scan. Incidental CT findings: No breast masses, supraclavicular or axillary adenopathy. ABDOMEN/PELVIS: The 3.0 x 3.0 cm pancreatic head mass is hypermetabolic with SUV max of 4.31. No adjacent lymphadenopathy. No findings for hepatic metastatic disease. The adrenal glands are unremarkable. Marked hypermetabolism noted in the right periurethral region. 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 It is better demonstrated on the prior CT scan. Anal activity may be related to internal hemorrhoids. Recommend correlation with physical examination. Incidental CT findings: Status post cholecystectomy. No biliary dilatation. Remarkably no atherosclerotic calcifications involving the aorta and iliac arteries. Periumbilical anterior  abdominal wall hernia containing fat. Sigmoid colon diverticulosis without findings for acute diverticulitis. SKELETON: No focal hypermetabolic activity to suggest skeletal metastasis. Incidental CT findings: none IMPRESSION: 1. 3 cm pancreatic head mass is hypermetabolic and consistent with known pancreatic adenocarcinoma. No surrounding adenopathy or evidence of hepatic metastatic disease. 2. No findings for metastatic disease involving the chest or bony structures. 3. 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 Electronically Signed   By: Marijo Sanes M.D.   On: 10/30/2017 16:51    ASSESSMENT: Clinical stage Ib pancreatic adenocarcinoma.  PLAN:    1.  Clinical stage Ib pancreatic adenocarcinoma: EUS completed on October 25, 2017 confirmed adenocarcinoma. Patient CA-19-9 is mildly elevated at 78.  PET scan results reviewed independently and reported as above confirming no other disease outside the pancreas.  After review of cancer conference and with radiology, it appears the lesion is abutting the SMA, therefore patient will require neoadjuvant chemotherapy.  She was evaluated by surgery and is considered borderline resectable.  Patient may also require stereotactic XRT prior to surgery.  Patient had a difficult time with cycle 1 of neoadjuvant FOLFIRINOX last week.  Because of this will dose reduce her treatment.  Return to clinic in 1 week as previously scheduled for further evaluation and consideration of cycle 2 of 4 of neoadjuvant FOLFIRINOX. 2.  Palliative care: Patient was given a referral for further evaluation. 3.  Pain: Continue fentanyl patch to 50 mcg every 3 hours.  Continue oxycodone as prescribed.   4.  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    No intervention is needed at this time.  We will continue to monitor while patient is undergoing treatment for her pancreatic cancer.  Patient has  follow-up with urology on November 20, 2017. 4.  Genetic: Patient was given to referral for genetics for further evaluation and counseling. 5.  Pancytopenia: Secondary to treatment, monitor. 6.  Chronic renal insufficiency: Patient's creatinine is improved to 1.25.  Monitor.   Patient expressed understanding and was in agreement with this plan. She also understands that She can call clinic at any time with any questions, concerns, or complaints.   Cancer Staging Pancreatic adenocarcinoma [facility]) Staging form: Exocrine Pancreas, AJCC 8th Edition - Clinical stage from 10/26/2017: Stage IB (cT2, cN0, cM0) - Signed by Lloyd Huger, MD on 10/26/2017   Lloyd Huger, MD   11/24/2017 7:05 AM

## 2017-11-20 ENCOUNTER — Ambulatory Visit: Payer: Self-pay | Admitting: Urology

## 2017-11-20 ENCOUNTER — Telehealth: Payer: Self-pay | Admitting: Genetic Counselor

## 2017-11-20 ENCOUNTER — Encounter: Payer: Self-pay | Admitting: Genetic Counselor

## 2017-11-20 ENCOUNTER — Encounter

## 2017-11-20 DIAGNOSIS — C259 Malignant neoplasm of pancreas, unspecified: Secondary | ICD-10-CM

## 2017-11-20 NOTE — Telephone Encounter (Signed)
Cancer Genetics            Telegenetics Initial Visit    Patient Name: Cindy Bowman Patient DOB: 02-11-1951 Patient Age: 67 y.o. Phone Call Date: 11/20/2017  Referring Provider: Delight Hoh, MD  Reason for Visit: Evaluate for hereditary susceptibility to cancer    Assessment and Plan:  . Cindy Bowman' personal history of pancreatic cancer warrants a genetics evaluation even though her family history is not suggestive of a hereditary predisposition to cancer. She has a very large family with no history of cancer. She meets NCCN criteria for genetic testing due to her own history.  . Testing is recommended to determine whether she has a pathogenic mutation that will potentially impact her treatment, screening and risk-reduction for future cancer. A negative result will be reassuring.  . Cindy Bowman wished to pursue genetic testing, and will schedule a blood draw. Analysis will include the 84 genes on [facility]'s Multi-Cancer panel (AIP, ALK, APC, ATM, AXIN2, BAP1, BARD1, BLM, BMPR1A, BRCA1, BRCA2, BRIP1, CASR, CDC73, CDH1, CDK4, CDKN1B, CDKN1C, CDKN2A, CEBPA, CHEK2, CTNNA1, DICER1, DIS3L2, EGFR, EPCAM, FH, FLCN, GATA2, GPC3, GREM1, HOXB13, HRAS, KIT, MAX, MEN1, MET, MITF, MLH1, MSH2, MSH3, MSH6, MUTYH, NBN, NF1, NF2, NTHL1, PALB2, PDGFRA, PHOX2B, PMS2, POLD1, POLE, POT1, PRKAR1A, PTCH1, PTEN, RAD50, RAD51C, RAD51D, RB1, RECQL4, RET, RUNX1, SDHA, SDHAF2, SDHB, SDHC, SDHD, SMAD4, SMARCA4, SMARCB1, SMARCE1, STK11, SUFU, TERC, TERT, TMEM127, TP53, TSC1, TSC2, VHL, WRN, WT1).   . Once the lab receives her specimen, results should be available in approximately 2-3 weeks, at which point we will contact her and address implications for her as well as address genetic testing for at-risk family members, if needed.     Dr. Grayland Ormond was available for questions concerning this case. Total time spent by counseling by phone was approximately 25 minutes.    _____________________________________________________________________   History of Present Illness: Cindy Bowman, a 67 y.o. female, was referred for genetic counseling to discuss the possibility of a hereditary predisposition to cancer and discuss whether genetic testing is warranted. This was a telegenetics visit via phone.  Cindy Bowman was diagnosed with pancreatic cancer at the age of 68. She will be receiving neoadjuvant chemotherapy     Pancreatic adenocarcinoma ([place])   10/18/2017 Initial Diagnosis    Pancreatic adenocarcinoma ([place])    10/26/2017 Cancer Staging    Staging form: Exocrine Pancreas, AJCC 8th Edition - Clinical stage from 10/26/2017: Stage IB (cT2, cN0, cM0) - Signed by Lloyd Huger, MD on 10/26/2017    11/06/2017 -  Chemotherapy    The patient had palonosetron (ALOXI) injection 0.25 mg, 0.25 mg, Intravenous,  Once, 1 of 4 cycles Administration: 0.25 mg (11/14/2017) irinotecan (CAMPTOSAR) 340 mg in dextrose 5 % 500 mL chemo infusion, 180 mg/m2 = 340 mg, Intravenous,  Once, 1 of 4 cycles Administration: 340 mg (11/14/2017) leucovorin 750 mg in dextrose 5 % 250 mL infusion, 760 mg, Intravenous,  Once, 1 of 4 cycles Administration: 750 mg (11/14/2017) oxaliplatin (ELOXATIN) 150 mg in dextrose 5 % 500 mL chemo infusion, 160 mg, Intravenous,  Once, 1 of 4 cycles Administration: 150 mg (11/14/2017) fluorouracil (ADRUCIL) chemo injection 750 mg, 400 mg/m2 = 750 mg, Intravenous,  Once, 1 of 4 cycles Administration: 750 mg (11/14/2017) fluorouracil (ADRUCIL) 4,550 mg in sodium chloride 0.9 % 59 mL chemo infusion, 2,400 mg/m2 = 4,550 mg, Intravenous, 1 Day/Dose, 1 of 4 cycles Administration: 4,550 mg (11/14/2017)  for  chemotherapy treatment.       Past Medical History:  Diagnosis Date  . Essential hypertension   . Pancreatic cancer ([place])   . UTI (urinary tract infection)     Past Surgical History:  Procedure Laterality Date  . CESAREAN SECTION    . CHOLECYSTECTOMY     . EUS N/A 10/25/2017   Procedure: FULL UPPER ENDOSCOPIC ULTRASOUND (EUS) RADIAL;  Surgeon: Jola Schmidt, MD;  Location: [facility] ENDOSCOPY;  Service: Endoscopy;  Laterality: N/A;  . OOPHORECTOMY    . PORTA CATH INSERTION N/A 11/12/2017   Procedure: PORTA CATH INSERTION;  Surgeon: Algernon Huxley, MD;  Location: [place] CV LAB;  Service: Cardiovascular;  Laterality: N/A;  . TUBAL LIGATION      Family History: Significant diagnoses include the following:  Family History  Problem Relation Age of Onset  . Hypertension Mother        deceased 60  . Heart attack Father        deceased late 76s    Additionally, Cindy Bowman has one son (age 46). She has 2 living brothers and 4 living sisters (ages 63-80s) as well as one brother who is deceased. She also had a paternal half-brother. Her mother died at 70 and her father died in his 63s; both cancer-free. She did not know how many siblings her parents had, but stated they had large families with no cancers in anyone.  Cindy Bowman ancestry is African American. There is no known Jewish ancestry and no consanguinity.  Discussion: We reviewed the characteristics, features and inheritance patterns of hereditary cancer syndromes. We discussed her risk of harboring a mutation in the context of her personal and family history. We discussed the process of genetic testing, insurance coverage and implications of results: positive, negative and variant of uncertain significance (VUS).   Cindy Bowman' questions were answered to her satisfaction today and she is welcome to call with any additional questions or concerns. Thank you for the referral and allowing [place] to share in the care of your patient.    Steele Berg, MS, [place] Certified Genetic Counselor phone: [phone]

## 2017-11-20 NOTE — Telephone Encounter (Signed)
Dr. Grayland Ormond is referring Ms. Malinowski for genetic counseling due to a personal history of pancreatic cancer.  I left her a message to call and schedule this telegenetics visit to be done by phone at her convenience.   Steele Berg, [place], [place] Genetic Counselor Phone: [phone]

## 2017-11-21 ENCOUNTER — Inpatient Hospital Stay: Payer: Medicare Other | Attending: Oncology

## 2017-11-21 ENCOUNTER — Inpatient Hospital Stay: Payer: Medicare Other

## 2017-11-21 ENCOUNTER — Other Ambulatory Visit: Payer: Self-pay

## 2017-11-21 ENCOUNTER — Other Ambulatory Visit: Payer: Self-pay | Admitting: *Deleted

## 2017-11-21 ENCOUNTER — Inpatient Hospital Stay (HOSPITAL_BASED_OUTPATIENT_CLINIC_OR_DEPARTMENT_OTHER): Payer: Medicare Other | Admitting: Oncology

## 2017-11-21 VITALS — BP 137/82 | HR 91 | Temp 96.5°F | Resp 18 | Wt 166.6 lb

## 2017-11-21 DIAGNOSIS — R52 Pain, unspecified: Secondary | ICD-10-CM

## 2017-11-21 DIAGNOSIS — Z5111 Encounter for antineoplastic chemotherapy: Secondary | ICD-10-CM | POA: Diagnosis not present

## 2017-11-21 DIAGNOSIS — Z79899 Other long term (current) drug therapy: Secondary | ICD-10-CM | POA: Insufficient documentation

## 2017-11-21 DIAGNOSIS — C259 Malignant neoplasm of pancreas, unspecified: Secondary | ICD-10-CM

## 2017-11-21 DIAGNOSIS — N2889 Other specified disorders of kidney and ureter: Secondary | ICD-10-CM | POA: Diagnosis not present

## 2017-11-21 DIAGNOSIS — D61818 Other pancytopenia: Secondary | ICD-10-CM | POA: Insufficient documentation

## 2017-11-21 DIAGNOSIS — N189 Chronic kidney disease, unspecified: Secondary | ICD-10-CM | POA: Diagnosis not present

## 2017-11-21 DIAGNOSIS — M189 Osteoarthritis of first carpometacarpal joint, unspecified: Secondary | ICD-10-CM | POA: Diagnosis not present

## 2017-11-21 DIAGNOSIS — R970 Elevated carcinoembryonic antigen [CEA]: Secondary | ICD-10-CM | POA: Insufficient documentation

## 2017-11-21 LAB — CBC WITH DIFFERENTIAL/PLATELET
BASOS PCT: 0 %
Basophils Absolute: 0 10*3/uL (ref 0–0.1)
EOS ABS: 0 10*3/uL (ref 0–0.7)
Eosinophils Relative: 1 %
HCT: 32.4 % — ABNORMAL LOW (ref 35.0–47.0)
HEMOGLOBIN: 11.1 g/dL — AB (ref 12.0–16.0)
Lymphocytes Relative: 44 %
Lymphs Abs: 2.3 10*3/uL (ref 1.0–3.6)
MCH: 31.8 pg (ref 26.0–34.0)
MCHC: 34.3 g/dL (ref 32.0–36.0)
MCV: 92.8 fL (ref 80.0–100.0)
Monocytes Absolute: 0.1 10*3/uL — ABNORMAL LOW (ref 0.2–0.9)
Monocytes Relative: 1 %
NEUTROS PCT: 54 %
Neutro Abs: 2.8 10*3/uL (ref 1.4–6.5)
Platelets: 113 10*3/uL — ABNORMAL LOW (ref 150–440)
RBC: 3.5 MIL/uL — AB (ref 3.80–5.20)
RDW: 13.8 % (ref 11.5–14.5)
WBC: 5.3 10*3/uL (ref 3.6–11.0)

## 2017-11-21 LAB — COMPREHENSIVE METABOLIC PANEL
ALT: 23 U/L (ref 0–44)
AST: 21 U/L (ref 15–41)
Albumin: 3.6 g/dL (ref 3.5–5.0)
Alkaline Phosphatase: 89 U/L (ref 38–126)
Anion gap: 9 (ref 5–15)
BUN: 15 mg/dL (ref 8–23)
CALCIUM: 8.9 mg/dL (ref 8.9–10.3)
CHLORIDE: 111 mmol/L (ref 98–111)
CO2: 23 mmol/L (ref 22–32)
CREATININE: 1.25 mg/dL — AB (ref 0.44–1.00)
GFR, EST AFRICAN AMERICAN: 50 mL/min — AB (ref >60)
GFR, EST NON AFRICAN AMERICAN: 43 mL/min — AB (ref >60)
Glucose, Bld: 118 mg/dL — ABNORMAL HIGH (ref 70–99)
Potassium: 4.4 mmol/L (ref 3.5–5.1)
Sodium: 143 mmol/L (ref 135–145)
Total Bilirubin: 0.5 mg/dL (ref 0.3–1.2)
Total Protein: 7.3 g/dL (ref 6.5–8.1)

## 2017-11-21 MED ORDER — OXYCODONE HCL 10 MG PO TABS: 10.0000 mg | ORAL_TABLET | Freq: Four times a day (QID) | ORAL | 0 refills | Status: DC | PRN

## 2017-11-21 NOTE — Progress Notes (Signed)
Pt here for follow up. Stated she was admitted to hospital last thurs ( 1 day after chemo ) admitted w uncontrollable nausea and vomiting. Admitted x 2 days-uch better now she stated

## 2017-11-26 ENCOUNTER — Other Ambulatory Visit: Payer: Self-pay | Admitting: Oncology

## 2017-11-26 NOTE — Progress Notes (Signed)
[name]  [phone] [phone]  ID: Cindy Bowman OB: 1950/10/04  MR#: [id number]  ERX#:[id number]  Patient Care Team: Glendon Axe, MD as PCP - General (Internal Medicine) Clent Jacks, RN as Registered Nurse  CHIEF COMPLAINT: Clinical stage Ib pancreatic adenocarcinoma.  INTERVAL HISTORY: Patient returns to clinic today for further evaluation and consideration of cycle 2 of neoadjuvant FOLFIRINOX.  She had a difficult time with her first infusion, but now feels well and nearly back to her baseline.  She admits to weakness and fatigue.  She has a decreased appetite. Her pain is better controlled on her current narcotic regimen.  She denies any fevers.  She denies any chest pain or shortness of breath.  She has no nausea, vomiting, constipation, or diarrhea.  She has no urinary complaints.  Patient offers no further specific complaints today.  REVIEW OF SYSTEMS:   Review of Systems  Constitutional: Positive for malaise/fatigue. Negative for fever and weight loss.  Respiratory: Negative.  Negative for cough and shortness of breath.   Cardiovascular: Negative.  Negative for chest pain and leg swelling.  Gastrointestinal: Negative.  Negative for abdominal pain, blood in stool, melena, nausea and vomiting.  Genitourinary: Negative.  Negative for dysuria.  Musculoskeletal: Negative.  Negative for back pain.  Skin: Negative.  Negative for rash.  Neurological: Positive for weakness. Negative for dizziness, focal weakness and headaches.  Psychiatric/Behavioral: Negative.  The patient is not nervous/anxious.     As per HPI. Otherwise, a complete review of systems is negative.  PAST MEDICAL HISTORY: Past Medical History:  Diagnosis Date  . Essential hypertension   . Pancreatic cancer ([place])   . UTI (urinary tract infection)     PAST SURGICAL HISTORY: Past Surgical History:  Procedure Laterality Date  . CESAREAN SECTION    . CHOLECYSTECTOMY     . EUS N/A 10/25/2017   Procedure: FULL UPPER ENDOSCOPIC ULTRASOUND (EUS) RADIAL;  Surgeon: Jola Schmidt, MD;  Location: [facility] ENDOSCOPY;  Service: Endoscopy;  Laterality: N/A;  . OOPHORECTOMY    . PORTA CATH INSERTION N/A 11/12/2017   Procedure: PORTA CATH INSERTION;  Surgeon: Algernon Huxley, MD;  Location: [place] [facility];  Service: Cardiovascular;  Laterality: N/A;  . TUBAL LIGATION      FAMILY HISTORY: Family History  Problem Relation Age of Onset  . Hypertension Mother        deceased 51  . Heart attack Father        deceased late 41s    ADVANCED DIRECTIVES (Y/N):  N  HEALTH MAINTENANCE: Social History   Tobacco Use  . Smoking status: Never Smoker  . Smokeless tobacco: Never Used  Substance Use Topics  . Alcohol use: Not Currently  . Drug use: Never     Colonoscopy:  PAP:  Bone density:  Lipid panel:  Allergies  Allergen Reactions  . Nsaids Other (See Comments)    Decreased GFR  . Ibuprofen     Lowers kidney function  . Penicillins     Yeast infection  Has patient had a PCN reaction causing immediate rash, facial/tongue/throat swelling, SOB or lightheadedness with hypotension: No Has patient had a PCN reaction causing severe rash involving mucus membranes or skin necrosis: No Has patient had a PCN reaction that required hospitalization: No Has patient had a PCN reaction occurring within the last 10 years: No If all of the above answers are "NO", then may proceed with Cephalosporin use.     Current Outpatient Medications  Medication Sig Dispense Refill  . docusate sodium (COLACE) 100 MG capsule Take 2 capsules (200 mg total) by mouth 2 (two) times daily. 30 capsule 0  . fentaNYL (DURAGESIC - DOSED MCG/HR) 25 MCG/HR patch Place 1 patch (25 mcg total) onto the skin every 3 (three) days. 10 patch 0  . levofloxacin (LEVAQUIN) 250 MG tablet Take by mouth.    . lidocaine-prilocaine (EMLA) cream Apply to affected area once 30 g 3  . losartan (COZAAR) 50 MG  tablet Take 1 tablet (50 mg total) by mouth daily. 30 tablet 0  . metoCLOPramide (REGLAN) 10 MG tablet Take 1 tablet (10 mg total) by mouth 3 (three) times daily with meals. 90 tablet 0  . ondansetron (ZOFRAN ODT) 4 MG disintegrating tablet Take 1 tablet (4 mg total) by mouth every 8 (eight) hours as needed for nausea or vomiting. 20 tablet 0  . Oxycodone HCl 10 MG TABS Take 1 tablet (10 mg total) by mouth every 6 (six) hours as needed. 60 tablet 0  . prochlorperazine (COMPAZINE) 10 MG tablet Take 1 tablet (10 mg total) by mouth every 6 (six) hours as needed (NAUSEA). 60 tablet 2  . promethazine (PHENERGAN) 12.5 MG tablet Take 1 tablet (12.5 mg total) by mouth 3 (three) times daily before meals. 30 tablet 0   No current facility-administered medications for this visit.     OBJECTIVE: Vitals:   11/28/17 0851  BP: 137/63  Pulse: (!) 115  Resp: 20  Temp: 97.6 F (36.4 C)     Body mass index is 28.61 kg/m.    ECOG FS:0 - Asymptomatic  General: Well-developed, well-nourished, no acute distress. Eyes: Pink conjunctiva, anicteric sclera. HEENT: Normocephalic, moist mucous membranes. Lungs: Clear to auscultation bilaterally. Heart: Regular rate and rhythm. No rubs, murmurs, or gallops. Abdomen: Soft, nontender, nondistended. No organomegaly noted, normoactive bowel sounds. Musculoskeletal: No edema, cyanosis, or clubbing. Neuro: Alert, answering all questions appropriately. Cranial nerves grossly intact. Skin: No rashes or petechiae noted. Psych: Normal affect.   LAB RESULTS:  Lab Results  Component Value Date   NA 138 11/28/2017   K 3.8 11/28/2017   CL 105 11/28/2017   CO2 22 11/28/2017   GLUCOSE 182 (H) 11/28/2017   BUN 24 (H) 11/28/2017   CREATININE 1.60 (H) 11/28/2017   CALCIUM 8.9 11/28/2017   PROT 7.6 11/28/2017   ALBUMIN 3.1 (L) 11/28/2017   AST 29 11/28/2017   ALT 26 11/28/2017   ALKPHOS 86 11/28/2017   BILITOT 0.7 11/28/2017   GFRNONAA 32 (L) 11/28/2017   GFRAA 37  (L) 11/28/2017    Lab Results  Component Value Date   WBC 5.1 11/28/2017   NEUTROABS 1.5 (L) 11/28/2017   HGB 9.8 (L) 11/28/2017   HCT 29.7 (L) 11/28/2017   MCV 91.7 11/28/2017   PLT 232 11/28/2017     STUDIES: No results found.  ASSESSMENT: Clinical stage Ib pancreatic adenocarcinoma.  PLAN:    1.  Clinical stage Ib pancreatic adenocarcinoma: EUS completed on October 25, 2017 confirmed adenocarcinoma. Patient CA-19-9 is mildly elevated at 78.  PET scan results reviewed independently confirming no other disease outside the pancreas.  After review at cancer conference and with radiology, it appears the lesion is abutting the SMA, therefore patient will require neoadjuvant chemotherapy.  She was evaluated by surgery and is considered borderline resectable.  Patient may also require stereotactic XRT prior to surgery.  Patient had a difficult time with cycle 1 of neoadjuvant FOLFIRINOX.  Because of this will  dose reduce her treatment.  Proceed with cycle 2 today.  Return to clinic in 2 days for pump removal and then in 2 weeks for further evaluation and consideration of cycle 3 of 4.   2.  Palliative care: Patient was given a referral for further evaluation. 3.  Pain: Improved.  Continue fentanyl patch to 50 mcg every 3 hours.  Continue oxycodone as prescribed.   4.  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    No intervention is needed at this time.  Continue to monitor while patient is undergoing treatment for her pancreatic cancer.  Patient was given a referral to urology.   4.  Genetic: Genetic testing is pending at time of dictation. 5.  Anemia: Patient's hemoglobin has trended down slightly, monitor. 6.  Chronic renal insufficiency: Patient's creatinine has trended up slightly to 1.6.  Will give IV fluids with pump removal in 2 days.     Patient expressed understanding and was in agreement with this plan. She also understands that She can call clinic at any time with  any questions, concerns, or complaints.   Cancer Staging Pancreatic adenocarcinoma [employer]) Staging form: Exocrine Pancreas, AJCC 8th Edition - Clinical stage from 10/26/2017: Stage IB (cT2, cN0, cM0) - Signed by Lloyd Huger, MD on 10/26/2017   Lloyd Huger, MD   11/30/2017 9:35 AM

## 2017-11-28 ENCOUNTER — Inpatient Hospital Stay (HOSPITAL_BASED_OUTPATIENT_CLINIC_OR_DEPARTMENT_OTHER): Payer: Medicare Other | Admitting: Oncology

## 2017-11-28 ENCOUNTER — Inpatient Hospital Stay (HOSPITAL_BASED_OUTPATIENT_CLINIC_OR_DEPARTMENT_OTHER): Payer: Medicare Other | Admitting: Hospice and Palliative Medicine

## 2017-11-28 ENCOUNTER — Inpatient Hospital Stay: Payer: Medicare Other

## 2017-11-28 ENCOUNTER — Encounter: Payer: Self-pay | Admitting: Oncology

## 2017-11-28 VITALS — BP 137/63 | HR 115 | Temp 97.6°F | Resp 20 | Wt 161.5 lb

## 2017-11-28 DIAGNOSIS — C259 Malignant neoplasm of pancreas, unspecified: Secondary | ICD-10-CM | POA: Diagnosis not present

## 2017-11-28 DIAGNOSIS — D649 Anemia, unspecified: Secondary | ICD-10-CM

## 2017-11-28 DIAGNOSIS — Z515 Encounter for palliative care: Secondary | ICD-10-CM

## 2017-11-28 DIAGNOSIS — R52 Pain, unspecified: Secondary | ICD-10-CM

## 2017-11-28 DIAGNOSIS — N2889 Other specified disorders of kidney and ureter: Secondary | ICD-10-CM

## 2017-11-28 DIAGNOSIS — N183 Chronic kidney disease, stage 3 (moderate): Secondary | ICD-10-CM | POA: Diagnosis not present

## 2017-11-28 DIAGNOSIS — Z5111 Encounter for antineoplastic chemotherapy: Secondary | ICD-10-CM | POA: Diagnosis not present

## 2017-11-28 DIAGNOSIS — R970 Elevated carcinoembryonic antigen [CEA]: Secondary | ICD-10-CM

## 2017-11-28 LAB — CBC WITH DIFFERENTIAL/PLATELET
BAND NEUTROPHILS: 7 %
BASOS PCT: 0 %
Basophils Absolute: 0 10*3/uL (ref 0.0–0.1)
Eosinophils Absolute: 0.1 10*3/uL (ref 0.0–0.5)
Eosinophils Relative: 2 %
HCT: 29.7 % — ABNORMAL LOW (ref 36.0–46.0)
Hemoglobin: 9.8 g/dL — ABNORMAL LOW (ref 12.0–15.0)
LYMPHS PCT: 55 %
Lymphs Abs: 2.8 10*3/uL (ref 0.7–4.0)
MCH: 30.2 pg (ref 26.0–34.0)
MCHC: 33 g/dL (ref 30.0–36.0)
MCV: 91.7 fL (ref 80.0–100.0)
METAMYELOCYTES PCT: 1 %
MONOS PCT: 13 %
Monocytes Absolute: 0.7 10*3/uL (ref 0.1–1.0)
Myelocytes: 1 %
NEUTROS ABS: 1.5 10*3/uL — AB (ref 1.7–7.7)
Neutrophils Relative %: 21 %
PLATELET MORPHOLOGY: NORMAL
PLATELETS: 232 10*3/uL (ref 150–400)
RBC: 3.24 MIL/uL — AB (ref 3.87–5.11)
RDW: 13.6 % (ref 11.5–15.5)
WBC: 5.1 10*3/uL (ref 4.0–10.5)
nRBC: 0 % (ref 0.0–0.2)

## 2017-11-28 LAB — COMPREHENSIVE METABOLIC PANEL
ALBUMIN: 3.1 g/dL — AB (ref 3.5–5.0)
ALT: 26 U/L (ref 0–44)
AST: 29 U/L (ref 15–41)
Alkaline Phosphatase: 86 U/L (ref 38–126)
Anion gap: 11 (ref 5–15)
BUN: 24 mg/dL — AB (ref 8–23)
CHLORIDE: 105 mmol/L (ref 98–111)
CO2: 22 mmol/L (ref 22–32)
Calcium: 8.9 mg/dL (ref 8.9–10.3)
Creatinine, Ser: 1.6 mg/dL — ABNORMAL HIGH (ref 0.44–1.00)
GFR calc Af Amer: 37 mL/min — ABNORMAL LOW (ref >60)
GFR, EST NON AFRICAN AMERICAN: 32 mL/min — AB (ref >60)
GLUCOSE: 182 mg/dL — AB (ref 70–99)
POTASSIUM: 3.8 mmol/L (ref 3.5–5.1)
SODIUM: 138 mmol/L (ref 135–145)
Total Bilirubin: 0.7 mg/dL (ref 0.3–1.2)
Total Protein: 7.6 g/dL (ref 6.5–8.1)

## 2017-11-28 MED ORDER — LEUCOVORIN CALCIUM INJECTION 350 MG
368.0000 mg/m2 | Freq: Once | INTRAVENOUS | Status: AC
  Administered 2017-11-28: 700 mg via INTRAVENOUS
  Filled 2017-11-28: qty 25

## 2017-11-28 MED ORDER — DEXTROSE 5 % IV SOLN
Freq: Once | INTRAVENOUS | Status: DC
  Filled 2017-11-28: qty 250

## 2017-11-28 MED ORDER — SODIUM CHLORIDE 0.9 % IV SOLN: 10.0000 mg | Freq: Once | INTRAVENOUS | Status: DC

## 2017-11-28 MED ORDER — HEPARIN SOD (PORK) LOCK FLUSH 100 UNIT/ML IV SOLN
500.0000 [IU] | Freq: Once | INTRAVENOUS | Status: DC
  Filled 2017-11-28: qty 5

## 2017-11-28 MED ORDER — HEPARIN SOD (PORK) LOCK FLUSH 100 UNIT/ML IV SOLN: 500.0000 [IU] | Freq: Once | INTRAVENOUS | Status: DC | PRN

## 2017-11-28 MED ORDER — DEXAMETHASONE SODIUM PHOSPHATE 10 MG/ML IJ SOLN
10.0000 mg | Freq: Once | INTRAMUSCULAR | Status: AC
  Administered 2017-11-28: 10 mg via INTRAVENOUS
  Filled 2017-11-28: qty 1

## 2017-11-28 MED ORDER — LORAZEPAM 2 MG/ML IJ SOLN
1.0000 mg | Freq: Once | INTRAMUSCULAR | Status: AC
  Administered 2017-11-28: 1 mg via INTRAVENOUS
  Filled 2017-11-28: qty 1

## 2017-11-28 MED ORDER — SODIUM CHLORIDE 0.9% FLUSH
10.0000 mL | INTRAVENOUS | Status: DC | PRN
  Administered 2017-11-28: 10 mL via INTRAVENOUS
  Filled 2017-11-28: qty 10

## 2017-11-28 MED ORDER — OXALIPLATIN CHEMO INJECTION 100 MG/20ML
70.0000 mg/m2 | Freq: Once | INTRAVENOUS | Status: AC
  Administered 2017-11-28: 135 mg via INTRAVENOUS
  Filled 2017-11-28: qty 20

## 2017-11-28 MED ORDER — ATROPINE SULFATE 1 MG/ML IJ SOLN
0.5000 mg | Freq: Once | INTRAMUSCULAR | Status: AC | PRN
  Administered 2017-11-28: 0.5 mg via INTRAVENOUS
  Filled 2017-11-28: qty 1

## 2017-11-28 MED ORDER — SODIUM CHLORIDE 0.9 % IV SOLN
2000.0000 mg/m2 | INTRAVENOUS | Status: DC
  Administered 2017-11-28: 3800 mg via INTRAVENOUS
  Filled 2017-11-28: qty 76

## 2017-11-28 MED ORDER — IRINOTECAN HCL CHEMO INJECTION 100 MG/5ML
160.0000 mg/m2 | Freq: Once | INTRAVENOUS | Status: AC
  Administered 2017-11-28: 300 mg via INTRAVENOUS
  Filled 2017-11-28: qty 15

## 2017-11-28 MED ORDER — PALONOSETRON HCL INJECTION 0.25 MG/5ML
0.2500 mg | Freq: Once | INTRAVENOUS | Status: AC
  Administered 2017-11-28: 0.25 mg via INTRAVENOUS
  Filled 2017-11-28: qty 5

## 2017-11-28 MED ORDER — FLUOROURACIL CHEMO INJECTION 2.5 GM/50ML
360.0000 mg/m2 | Freq: Once | INTRAVENOUS | Status: AC
  Administered 2017-11-28: 700 mg via INTRAVENOUS
  Filled 2017-11-28: qty 14

## 2017-11-28 MED ORDER — DEXTROSE 5 % IV SOLN
Freq: Once | INTRAVENOUS | Status: AC
  Administered 2017-11-28: 10:00:00 via INTRAVENOUS
  Filled 2017-11-28: qty 250

## 2017-11-28 NOTE — Progress Notes (Signed)
Patient here today for follow up and treatment consideration regarding pancreatic cancer. Patient reports she was seen by pcp on Monday, on antibiotics for UTI at this time. Patient reports decreased appetite, denies nausea/vomiting/diarrhea.

## 2017-11-28 NOTE — Progress Notes (Signed)
[place]  [phone] [phone]   Name: Cindy Bowman Date: 11/28/2017 MRN: [id number]  DOB: 11-24-1950  Patient Care Team: Glendon Axe, MD as PCP - General (Internal Medicine) Clent Jacks, RN as Registered Nurse    REASON FOR CONSULTATION: Palliative Care consult requested for this 67 y.o. female with multiple medical problems including stage Ib adenocarcinoma of the pancreas (diagnosed 09/2017) currently receiving neoadjuvant FOLFIRINOX plan for possible stereotactic XRT prior to resection.  Patient was recently hospitalized 11/16/2017 to 11/18/2017 with intractable nausea and vomiting secondary to chemotherapy, which improved with aggressive use of antiemetics.  Patient has been referred to palliative care to help with symptom management and medical goals.  SOCIAL HISTORY:    Patient is divorced.  She has one son with whom she lives.  Patient moved here from [place].  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  ADVANCE DIRECTIVES:  Patient says her son is her healthcare power of attorney.  Patient says she has a living will.  CODE STATUS: Full code  PAST MEDICAL HISTORY: Past Medical History:  Diagnosis Date  . Essential hypertension   . Pancreatic cancer ([place])   . UTI (urinary tract infection)     PAST SURGICAL HISTORY:  Past Surgical History:  Procedure Laterality Date  . CESAREAN SECTION    . CHOLECYSTECTOMY    . EUS N/A 10/25/2017   Procedure: FULL UPPER ENDOSCOPIC ULTRASOUND (EUS) RADIAL;  Surgeon: Jola Schmidt, MD;  Location: [facility] ENDOSCOPY;  Service: Endoscopy;  Laterality: N/A;  . OOPHORECTOMY    . PORTA CATH INSERTION N/A 11/12/2017   Procedure: PORTA CATH INSERTION;  Surgeon: Algernon Huxley, MD;  Location: [place] [facility];  Service: Cardiovascular;  Laterality: N/A;  . TUBAL LIGATION      HEMATOLOGY/ONCOLOGY HISTORY:    Pancreatic adenocarcinoma ([place])   10/18/2017 Initial Diagnosis   Pancreatic adenocarcinoma ([place])    10/26/2017 Cancer Staging    Staging form: Exocrine Pancreas, AJCC 8th Edition - Clinical stage from 10/26/2017: Stage IB (cT2, cN0, cM0) - Signed by Lloyd Huger, MD on 10/26/2017    11/06/2017 -  Chemotherapy    The patient had palonosetron (ALOXI) injection 0.25 mg, 0.25 mg, Intravenous,  Once, 1 of 4 cycles Administration: 0.25 mg (11/14/2017) irinotecan (CAMPTOSAR) 340 mg in dextrose 5 % 500 mL chemo infusion, 180 mg/m2 = 340 mg, Intravenous,  Once, 1 of 4 cycles Dose modification: 160 mg/m2 (original dose 180 mg/m2, Cycle 2, Reason: Dose not tolerated) Administration: 340 mg (11/14/2017) leucovorin 750 mg in dextrose 5 % 250 mL infusion, 760 mg, Intravenous,  Once, 1 of 4 cycles Dose modification: 360 mg/m2 (original dose 400 mg/m2, Cycle 2, Reason: Dose not tolerated) Administration: 750 mg (11/14/2017) oxaliplatin (ELOXATIN) 150 mg in dextrose 5 % 500 mL chemo infusion, 160 mg, Intravenous,  Once, 1 of 4 cycles Dose modification: 70 mg/m2 (original dose 85 mg/m2, Cycle 2, Reason: Dose not tolerated) Administration: 150 mg (11/14/2017) fluorouracil (ADRUCIL) chemo injection 750 mg, 400 mg/m2 = 750 mg, Intravenous,  Once, 1 of 4 cycles Dose modification: 360 mg/m2 (original dose 400 mg/m2, Cycle 2, Reason: Dose not tolerated) Administration: 750 mg (11/14/2017) fluorouracil (ADRUCIL) 4,550 mg in sodium chloride 0.9 % 59 mL chemo infusion, 2,400 mg/m2 = 4,550 mg, Intravenous, 1 Day/Dose, 1 of 4 cycles Dose modification: 2,000 mg/m2 (original dose 2,400 mg/m2, Cycle 2, Reason: Dose not tolerated) Administration: 4,550 mg (11/14/2017)  for chemotherapy treatment.  ALLERGIES:  is allergic to nsaids; ibuprofen; and penicillins.  MEDICATIONS:  Current Outpatient Medications  Medication Sig Dispense Refill  . docusate sodium (COLACE) 100 MG capsule Take 2 capsules (200 mg total) by mouth 2 (two) times daily. 30 capsule 0  . fentaNYL (DURAGESIC - DOSED  MCG/HR) 25 MCG/HR patch Place 1 patch (25 mcg total) onto the skin every 3 (three) days. 10 patch 0  . levofloxacin (LEVAQUIN) 250 MG tablet Take by mouth.    . lidocaine-prilocaine (EMLA) cream Apply to affected area once 30 g 3  . losartan (COZAAR) 50 MG tablet Take 1 tablet (50 mg total) by mouth daily. 30 tablet 0  . metoCLOPramide (REGLAN) 10 MG tablet Take 1 tablet (10 mg total) by mouth 3 (three) times daily with meals. 90 tablet 0  . ondansetron (ZOFRAN ODT) 4 MG disintegrating tablet Take 1 tablet (4 mg total) by mouth every 8 (eight) hours as needed for nausea or vomiting. 20 tablet 0  . Oxycodone HCl 10 MG TABS Take 1 tablet (10 mg total) by mouth every 6 (six) hours as needed. 60 tablet 0  . prochlorperazine (COMPAZINE) 10 MG tablet Take 1 tablet (10 mg total) by mouth every 6 (six) hours as needed (NAUSEA). 60 tablet 2  . promethazine (PHENERGAN) 12.5 MG tablet Take 1 tablet (12.5 mg total) by mouth 3 (three) times daily before meals. 30 tablet 0   No current facility-administered medications for this visit.    Facility-Administered Medications Ordered in Other Visits  Medication Dose Route Frequency Provider Last Rate Last Dose  . heparin lock flush 100 unit/mL  500 Units Intravenous Once Lloyd Huger, MD      . sodium chloride flush (NS) 0.9 % injection 10 mL  10 mL Intravenous PRN Lloyd Huger, MD   10 mL at 11/28/17 0824    VITAL SIGNS: There were no vitals taken for this visit. There were no vitals filed for this visit.  Estimated body mass index is 28.61 kg/m as calculated from the following:   Height as of 11/15/17: '5\' 3"'  (1.6 m).   Weight as of an earlier encounter on 11/28/17: 161 lb 8 oz (73.3 kg).  LABS: CBC:    Component Value Date/Time   WBC PENDING 11/28/2017 0833   HGB 9.8 (L) 11/28/2017 0833   HCT 29.7 (L) 11/28/2017 0833   PLT 232 11/28/2017 0833   MCV 91.7 11/28/2017 0833   NEUTROABS PENDING 11/28/2017 0833   LYMPHSABS PENDING 11/28/2017  0833   MONOABS PENDING 11/28/2017 0833   EOSABS PENDING 11/28/2017 0833   BASOSABS PENDING 11/28/2017 0833   Comprehensive Metabolic Panel:    Component Value Date/Time   NA 138 11/28/2017 0833   K 3.8 11/28/2017 0833   CL 105 11/28/2017 0833   CO2 22 11/28/2017 0833   BUN 24 (H) 11/28/2017 0833   CREATININE 1.60 (H) 11/28/2017 0833   GLUCOSE 182 (H) 11/28/2017 0833   CALCIUM 8.9 11/28/2017 0833   AST 29 11/28/2017 0833   ALT 26 11/28/2017 0833   ALKPHOS 86 11/28/2017 0833   BILITOT 0.7 11/28/2017 0833   PROT 7.6 11/28/2017 0833   ALBUMIN 3.1 (L) 11/28/2017 0833    RADIOGRAPHIC STUDIES: Nm Pet Image Initial (pi) Skull Base To Thigh  Result Date: 10/30/2017 CLINICAL DATA:  Initial treatment strategy for pancreatic adenocarcinoma. EXAM: NUCLEAR MEDICINE PET SKULL BASE TO THIGH TECHNIQUE: 9.42 mCi F-18 FDG was injected intravenously. Full-ring PET imaging was performed from the skull base to  thigh after the radiotracer. CT data was obtained and used for attenuation correction and anatomic localization. Fasting blood glucose: 119 mg/dl COMPARISON:  CT scan 10/17/2017 FINDINGS: Mediastinal blood pool activity: SUV max 2.99 NECK: No hypermetabolic lymph nodes in the neck. Incidental CT findings: none CHEST: No hypermetabolic mediastinal or hilar nodes. No suspicious pulmonary nodules on the CT scan. Incidental CT findings: No breast masses, supraclavicular or axillary adenopathy. ABDOMEN/PELVIS: The 3.0 x 3.0 cm pancreatic head mass is hypermetabolic with SUV max of 7.78. No adjacent lymphadenopathy. No findings for hepatic metastatic disease. The adrenal glands are unremarkable. Marked hypermetabolism noted in the right periurethral region. 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 It is better demonstrated on the prior CT scan. Anal activity may be related to internal hemorrhoids. Recommend correlation with physical examination. Incidental CT findings: Status post  cholecystectomy. No biliary dilatation. Remarkably no atherosclerotic calcifications involving the aorta and iliac arteries. Periumbilical anterior abdominal wall hernia containing fat. Sigmoid colon diverticulosis without findings for acute diverticulitis. SKELETON: No focal hypermetabolic activity to suggest skeletal metastasis. Incidental CT findings: none IMPRESSION: 1. 3 cm pancreatic head mass is hypermetabolic and consistent with known pancreatic adenocarcinoma. No surrounding adenopathy or evidence of hepatic metastatic disease. 2. No findings for metastatic disease involving the chest or bony structures. 3. 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 Electronically Signed   By: Marijo Sanes M.D.   On: 10/30/2017 16:51    PERFORMANCE STATUS (ECOG) : 1 - Symptomatic but completely ambulatory  Review of Systems As noted above. Otherwise, a complete review of systems is negative.  Physical Exam General: NAD Cardiovascular: regular rate and rhythm Pulmonary: clear ant fields Abdomen: soft, nontender, + bowel sounds GU: no suprapubic tenderness Extremities: no edema, no joint deformities Skin: no rashes Neurological: Weakness but otherwise nonfocal  IMPRESSION: Met with patient today in the clinic.  She says since discharging home from the hospital she has been doing better symptomatically.  She has not had any recent nausea or vomiting nor needed antiemetics.  However, we did discuss the possibility of recurrence with chemotherapy.  We discussed use of the antiemetics as needed and consideration of scheduling them if symptoms were severe.  Patient says pain is also improved on current opioid regimen of transdermal fentanyl 25 mcg every 72 hours and oxycodone 10 mg every 6 hours as needed.  Patient denies constipation.  Patient says her oral intake has been relatively poor since returning home from the hospital.  We  discussed small frequent meals, high-protein/high-calorie foods, and adding oral supplements.  We will also refer her to see our dietitian.  Patient has intermittent insomnia that she attributes to her mind racing at night.  We discussed proper sleep hygiene, limiting nighttime use of electronic devices, limiting daytime naps, and OTC medications such as melatonin.  We discussed alternative treatment including prescription medications, but patient would like to defer these at the present time.  We discussed patient's social support at home.  Since moving here from [place] to live with her son patient has not yet established a church or friend network.  She feels that her son is a significant support to her.  We discussed resources available in the cancer center including support groups.  Patient has some financial concerns related to her ability to pay for treatment and ongoing co-pays.  We will refer her to see Barnabas Lister, MSW.  Patient says that she has established advance  directives including a living will and naming her son as her healthcare power of attorney.  She says that she has had conversations with her son about future healthcare decisions including how aggressive to be in the event of a terminal illness.  Patient says that she would not want her life prolonged with futile care.  I reviewed with her a MOST form today.  However, patient would like to review this at home with her son prior to completion.   PLAN: 1. Refer to RD 2. Refer to SW 3. Continue antiemetics and opioids as ordered 4. MOST form reviewed. Patient interested in completing this at time of next visit.  5. Will see her back at time of next appointment with Dr. Grayland Ormond.    Patient expressed understanding and was in agreement with this plan. She also understands that She can call clinic at any time with any questions, concerns, or complaints.     Time Total: 30 minutes  Visit consisted of counseling and education dealing  with the complex and emotionally intense issues of symptom management and palliative care in the setting of serious and potentially life-threatening illness.Greater than 50%  of this time was spent counseling and coordinating care related to the above assessment and plan.  Signed by: Altha Harm, [place], NP-C, [place] (Work Cell)

## 2017-11-30 ENCOUNTER — Inpatient Hospital Stay: Payer: Medicare Other

## 2017-11-30 ENCOUNTER — Ambulatory Visit (HOSPITAL_BASED_OUTPATIENT_CLINIC_OR_DEPARTMENT_OTHER): Payer: Medicare Other | Admitting: Hospice and Palliative Medicine

## 2017-11-30 VITALS — BP 117/70 | HR 76 | Temp 97.4°F | Resp 20

## 2017-11-30 DIAGNOSIS — Z515 Encounter for palliative care: Secondary | ICD-10-CM

## 2017-11-30 DIAGNOSIS — C259 Malignant neoplasm of pancreas, unspecified: Secondary | ICD-10-CM

## 2017-11-30 DIAGNOSIS — Z5111 Encounter for antineoplastic chemotherapy: Secondary | ICD-10-CM | POA: Diagnosis not present

## 2017-11-30 DIAGNOSIS — Z66 Do not resuscitate: Secondary | ICD-10-CM

## 2017-11-30 MED ORDER — SODIUM CHLORIDE 0.9% FLUSH
10.0000 mL | INTRAVENOUS | Status: DC | PRN
  Administered 2017-11-30: 10 mL
  Filled 2017-11-30: qty 10

## 2017-11-30 MED ORDER — SODIUM CHLORIDE 0.9 % IV SOLN
Freq: Once | INTRAVENOUS | Status: AC
  Administered 2017-11-30: 14:00:00 via INTRAVENOUS
  Filled 2017-11-30: qty 250

## 2017-11-30 MED ORDER — SODIUM CHLORIDE 0.9 % IV SOLN: 10.0000 mg | Freq: Once | INTRAVENOUS | Status: DC

## 2017-11-30 MED ORDER — HEPARIN SOD (PORK) LOCK FLUSH 100 UNIT/ML IV SOLN
500.0000 [IU] | Freq: Once | INTRAVENOUS | Status: AC | PRN
  Administered 2017-11-30: 500 [IU]
  Filled 2017-11-30: qty 5

## 2017-11-30 MED ORDER — DEXAMETHASONE SODIUM PHOSPHATE 10 MG/ML IJ SOLN
10.0000 mg | Freq: Once | INTRAMUSCULAR | Status: AC
  Administered 2017-11-30: 10 mg via INTRAVENOUS
  Filled 2017-11-30: qty 1

## 2017-11-30 NOTE — Progress Notes (Signed)
[facility]  [phone] [phone]   Name: Cindy Bowman Date: 11/30/2017 MRN: [id number]  DOB: [birth year]  Patient Care Team: Glendon Axe, MD as PCP - General (Internal Medicine) Clent Jacks, RN as Registered Nurse    REASON FOR CONSULTATION: Palliative Care consult requested for this 67 y.o. female with multiple medical problems including stage Ib adenocarcinoma of the pancreas (diagnosed 09/2017) currently receiving neoadjuvant FOLFIRINOX plan for possible stereotactic XRT prior to resection.  Patient was recently hospitalized 11/16/2017 to 11/18/2017 with intractable nausea and vomiting secondary to chemotherapy, which improved with aggressive use of antiemetics.  Patient has been referred to palliative care to help with symptom management and medical goals.  SOCIAL HISTORY:    Patient is divorced.  She has one son with whom she lives.  Patient moved here from [place].  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  ADVANCE DIRECTIVES:  Patient says her son is her healthcare power of attorney.  Patient says she has a living will.  CODE STATUS: Full code  PAST MEDICAL HISTORY: Past Medical History:  Diagnosis Date  . Essential hypertension   . Pancreatic cancer ([place])   . UTI (urinary tract infection)     PAST SURGICAL HISTORY:  Past Surgical History:  Procedure Laterality Date  . CESAREAN SECTION    . CHOLECYSTECTOMY    . EUS N/A 10/25/2017   Procedure: FULL UPPER ENDOSCOPIC ULTRASOUND (EUS) RADIAL;  Surgeon: Jola Schmidt, MD;  Location: [facility] ENDOSCOPY;  Service: Endoscopy;  Laterality: N/A;  . OOPHORECTOMY    . PORTA CATH INSERTION N/A 11/12/2017   Procedure: PORTA CATH INSERTION;  Surgeon: Algernon Huxley, MD;  Location: [facility];  Service: Cardiovascular;  Laterality: N/A;  . TUBAL LIGATION      HEMATOLOGY/ONCOLOGY HISTORY:    Pancreatic adenocarcinoma ([place])   10/18/2017 Initial Diagnosis   Pancreatic adenocarcinoma ([place])    10/26/2017 Cancer Staging    Staging form: Exocrine Pancreas, AJCC 8th Edition - Clinical stage from 10/26/2017: Stage IB (cT2, cN0, cM0) - Signed by Lloyd Huger, MD on 10/26/2017    11/06/2017 -  Chemotherapy    The patient had palonosetron (ALOXI) injection 0.25 mg, 0.25 mg, Intravenous,  Once, 2 of 4 cycles Administration: 0.25 mg (11/14/2017), 0.25 mg (11/28/2017) irinotecan (CAMPTOSAR) 340 mg in dextrose 5 % 500 mL chemo infusion, 180 mg/m2 = 340 mg, Intravenous,  Once, 2 of 4 cycles Dose modification: 160 mg/m2 (original dose 180 mg/m2, Cycle 2, Reason: Dose not tolerated) Administration: 340 mg (11/14/2017), 300 mg (11/28/2017) leucovorin 750 mg in dextrose 5 % 250 mL infusion, 760 mg, Intravenous,  Once, 2 of 4 cycles Dose modification: 360 mg/m2 (original dose 400 mg/m2, Cycle 2, Reason: Dose not tolerated) Administration: 750 mg (11/14/2017), 700 mg (11/28/2017) oxaliplatin (ELOXATIN) 150 mg in dextrose 5 % 500 mL chemo infusion, 160 mg, Intravenous,  Once, 2 of 4 cycles Dose modification: 70 mg/m2 (original dose 85 mg/m2, Cycle 2, Reason: Dose not tolerated) Administration: 150 mg (11/14/2017), 135 mg (11/28/2017) fluorouracil (ADRUCIL) chemo injection 750 mg, 400 mg/m2 = 750 mg, Intravenous,  Once, 2 of 4 cycles Dose modification: 360 mg/m2 (original dose 400 mg/m2, Cycle 2, Reason: Dose not tolerated) Administration: 750 mg (11/14/2017), 700 mg (11/28/2017) fluorouracil (ADRUCIL) 4,550 mg in sodium chloride 0.9 % 59 mL chemo infusion, 2,400 mg/m2 = 4,550 mg, Intravenous, 1 Day/Dose, 2 of 4 cycles Dose modification: 2,000 mg/m2 (original dose 2,400 mg/m2, Cycle 2, Reason: Dose  not tolerated) Administration: 4,550 mg (11/14/2017), 3,800 mg (11/28/2017)  for chemotherapy treatment.      ALLERGIES:  is allergic to nsaids; ibuprofen; and penicillins.  MEDICATIONS:  Current Outpatient Medications  Medication Sig Dispense Refill  . docusate sodium  (COLACE) 100 MG capsule Take 2 capsules (200 mg total) by mouth 2 (two) times daily. 30 capsule 0  . fentaNYL (DURAGESIC - DOSED MCG/HR) 25 MCG/HR patch Place 1 patch (25 mcg total) onto the skin every 3 (three) days. 10 patch 0  . levofloxacin (LEVAQUIN) 250 MG tablet Take by mouth.    . lidocaine-prilocaine (EMLA) cream Apply to affected area once 30 g 3  . losartan (COZAAR) 50 MG tablet Take 1 tablet (50 mg total) by mouth daily. 30 tablet 0  . metoCLOPramide (REGLAN) 10 MG tablet Take 1 tablet (10 mg total) by mouth 3 (three) times daily with meals. 90 tablet 0  . ondansetron (ZOFRAN ODT) 4 MG disintegrating tablet Take 1 tablet (4 mg total) by mouth every 8 (eight) hours as needed for nausea or vomiting. 20 tablet 0  . Oxycodone HCl 10 MG TABS Take 1 tablet (10 mg total) by mouth every 6 (six) hours as needed. 60 tablet 0  . prochlorperazine (COMPAZINE) 10 MG tablet Take 1 tablet (10 mg total) by mouth every 6 (six) hours as needed (NAUSEA). 60 tablet 2  . promethazine (PHENERGAN) 12.5 MG tablet Take 1 tablet (12.5 mg total) by mouth 3 (three) times daily before meals. 30 tablet 0   No current facility-administered medications for this visit.    Facility-Administered Medications Ordered in Other Visits  Medication Dose Route Frequency Provider Last Rate Last Dose  . 0.9 %  sodium chloride infusion   Intravenous Once Lloyd Huger, MD 999 mL/hr at 11/30/17 1358    . heparin lock flush 100 unit/mL  500 Units Intracatheter Once PRN Lloyd Huger, MD      . sodium chloride flush (NS) 0.9 % injection 10 mL  10 mL Intracatheter PRN Lloyd Huger, MD   10 mL at 11/30/17 1356    VITAL SIGNS: There were no vitals taken for this visit. There were no vitals filed for this visit.  Estimated body mass index is 28.61 kg/m as calculated from the following:   Height as of 11/15/17: 5\' 3"  (1.6 m).   Weight as of 11/28/17: 161 lb 8 oz (73.3 kg).  LABS: CBC:    Component Value  Date/Time   WBC 5.1 11/28/2017 0833   HGB 9.8 (L) 11/28/2017 0833   HCT 29.7 (L) 11/28/2017 0833   PLT 232 11/28/2017 0833   MCV 91.7 11/28/2017 0833   NEUTROABS 1.5 (L) 11/28/2017 0833   LYMPHSABS 2.8 11/28/2017 0833   MONOABS 0.7 11/28/2017 0833   EOSABS 0.1 11/28/2017 0833   BASOSABS 0.0 11/28/2017 0833   Comprehensive Metabolic Panel:    Component Value Date/Time   NA 138 11/28/2017 0833   K 3.8 11/28/2017 0833   CL 105 11/28/2017 0833   CO2 22 11/28/2017 0833   BUN 24 (H) 11/28/2017 0833   CREATININE 1.60 (H) 11/28/2017 0833   GLUCOSE 182 (H) 11/28/2017 0833   CALCIUM 8.9 11/28/2017 0833   AST 29 11/28/2017 0833   ALT 26 11/28/2017 0833   ALKPHOS 86 11/28/2017 0833   BILITOT 0.7 11/28/2017 0833   PROT 7.6 11/28/2017 0833   ALBUMIN 3.1 (L) 11/28/2017 4825    RADIOGRAPHIC STUDIES: No results found.  PERFORMANCE STATUS (ECOG) : 1 -  Symptomatic but completely ambulatory  Review of Systems As noted above. Otherwise, a complete review of systems is negative.  Physical Exam General: NAD, sitting in chair Cardiovascular: regular rate and rhythm Pulmonary: clear ant fields Abdomen: soft, nontender, + bowel sounds GU: no suprapubic tenderness Extremities: no edema, no joint deformities Skin: no rashes Neurological: Weakness but otherwise nonfocal  IMPRESSION: Patient seen in the infusion suite.  Patient requested a visit with me today to complete the MOST form.  Patient says that she has reviewed the form at home and has thought carefully about her healthcare decisions.  She says that she would not want to be resuscitated nor have her life prolonged artificially via machines or life support.  She would like to be a DNR.  However, her goals are consistent with continued treatment including future hospitalization if needed.  Symptomatically, she feels somewhat fatigued today but denies pain, shortness of breath, nausea/vomiting, or other distressing symptoms.  I  completed a MOST form today. The patient outlined their wishes for the following treatment decisions:  Cardiopulmonary Resuscitation: Do Not Attempt Resuscitation (DNR/No CPR)  Medical Interventions: Limited Additional Interventions: Use medical treatment, IV fluids and cardiac monitoring as indicated, DO NOT USE intubation or mechanical ventilation. May consider use of less invasive airway support such as BiPAP or CPAP. Also provide comfort measures. Transfer to the hospital if indicated. Avoid intensive care.   Antibiotics: Antibiotics if indicated  IV Fluids: IV fluids if indicated  Feeding Tube: Left Blank   PLAN: 1. Refer to RD 2. Refer to SW 3. MOST form completed today as outlined above   Patient expressed understanding and was in agreement with this plan. She also understands that She can call clinic at any time with any questions, concerns, or complaints.    Time Total: 20 minutes  Visit consisted of counseling and education dealing with the complex and emotionally intense issues of symptom management and palliative care in the setting of serious and potentially life-threatening illness.Greater than 50%  of this time was spent counseling and coordinating care related to the above assessment and plan.  Signed by: Altha Harm, [place] Border, NP-C, [place] (Work Cell)

## 2017-12-03 ENCOUNTER — Telehealth: Payer: Self-pay | Admitting: Genetic Counselor

## 2017-12-03 NOTE — Telephone Encounter (Signed)
Cancer Genetics             Telegenetics Results Disclosure   Patient Name: Cindy Bowman Patient DOB: 10-17-1950 Patient Age: 67 y.o. Phone Call Date: 12/03/2017  Referring Provider: Delight Hoh, MD    Cindy Bowman was called today to discuss genetic test results. Please see the Genetics telephone note from 12/03/17 for a detailed discussion of her personal and family histories and the recommendations provided. A message was left for her to call me back to review results in detail.  Genetic Testing: At the time of Cindy Bowman' telegenetics visit, she decided to pursue genetic testing of multiple genes associated with hereditary susceptibility to cancer. Testing included sequencing and deletion/duplication analysis. Testing did not reveal a pathogenic mutation in any of the genes analyzed.  A copy of the genetic test report will be scanned into Epic under the Media tab.  Interpretation: These results suggest that Cindy Bowman' cancer was most likely not due to an inherited predisposition. Most cancers happen by chance and this test, along with details of her family history, suggests that her cancer falls into this category.   Since the current test is not perfect, it is possible that there may be a gene mutation that current testing cannot detect, but that chance is small. It is possible that a different genetic factor, which has not yet been discovered or is not on this panel, is responsible for her pancreatic cancer diagnosis. Again, the likelihood of this is low. No additional testing is recommended at this time for Cindy Bowman.  Genes Analyzed: The genes analyzed were the 84 genes on [facility]'s Multi-Cancer panel (AIP, ALK, APC, ATM, AXIN2, BAP1, BARD1, BLM, BMPR1A, BRCA1, BRCA2, BRIP1, CASR, CDC73, CDH1, CDK4, CDKN1B, CDKN1C, CDKN2A, CEBPA, CHEK2, CTNNA1, DICER1, DIS3L2, EGFR, EPCAM, FH, FLCN, GATA2, GPC3, GREM1, HOXB13, HRAS, KIT, MAX, MEN1, MET, MITF, MLH1, MSH2,  MSH3, MSH6, MUTYH, NBN, NF1, NF2, NTHL1, PALB2, PDGFRA, PHOX2B, PMS2, POLD1, POLE, POT1, PRKAR1A, PTCH1, PTEN, RAD50, RAD51C, RAD51D, RB1, RECQL4, RET, RUNX1, SDHA, SDHAF2, SDHB, SDHC, SDHD, SMAD4, SMARCA4, SMARCB1, SMARCE1, STK11, SUFU, TERC, TERT, TMEM127, TP53, TSC1, TSC2, VHL, WRN, WT1).  Cancer Screening: Cindy Bowman is recommended to follow the cancer screening guidelines provided by her physicians.   Family Members: Family members are at some increased risk of developing cancer, over the general population risk, simply due to the family history. They are recommended to speak with their own providers about appropriate cancer screenings.   Any relative who had cancer at a young age or had a particularly rare cancer may also wish to pursue genetic testing. Genetic counselors can be located in other cities, by visiting the website of the [employer] ([employer]) and Field seismologist for a Dietitian by zip code.   Follow-Up: Cancer genetics is a rapidly advancing field and it is possible that new genetic tests will be appropriate for Cindy Bowman in the future. Cindy Bowman is encouraged to remain in contact with Genetics on an annual basis so we can update her personal and family histories, and let her know of advances in cancer genetics that may benefit the family. Cindy Bowman questions were answered to her satisfaction today, and she knows she is welcome to call anytime with additional questions.    Steele Berg, MS, [place] Certified Genetic Counselor phone: [phone]

## 2017-12-04 ENCOUNTER — Telehealth: Payer: Self-pay

## 2017-12-04 ENCOUNTER — Other Ambulatory Visit: Payer: Self-pay

## 2017-12-04 ENCOUNTER — Inpatient Hospital Stay: Payer: Medicare Other

## 2017-12-04 ENCOUNTER — Inpatient Hospital Stay (HOSPITAL_BASED_OUTPATIENT_CLINIC_OR_DEPARTMENT_OTHER): Payer: Medicare Other | Admitting: Nurse Practitioner

## 2017-12-04 ENCOUNTER — Other Ambulatory Visit: Payer: Self-pay | Admitting: *Deleted

## 2017-12-04 VITALS — BP 127/75 | HR 64 | Temp 97.9°F | Resp 18

## 2017-12-04 DIAGNOSIS — R197 Diarrhea, unspecified: Secondary | ICD-10-CM

## 2017-12-04 DIAGNOSIS — C259 Malignant neoplasm of pancreas, unspecified: Secondary | ICD-10-CM

## 2017-12-04 DIAGNOSIS — E860 Dehydration: Secondary | ICD-10-CM | POA: Diagnosis not present

## 2017-12-04 DIAGNOSIS — D701 Agranulocytosis secondary to cancer chemotherapy: Secondary | ICD-10-CM | POA: Diagnosis not present

## 2017-12-04 DIAGNOSIS — R112 Nausea with vomiting, unspecified: Secondary | ICD-10-CM

## 2017-12-04 DIAGNOSIS — Z95828 Presence of other vascular implants and grafts: Secondary | ICD-10-CM

## 2017-12-04 DIAGNOSIS — A0472 Enterocolitis due to Clostridium difficile, not specified as recurrent: Secondary | ICD-10-CM | POA: Diagnosis not present

## 2017-12-04 DIAGNOSIS — T451X5A Adverse effect of antineoplastic and immunosuppressive drugs, initial encounter: Secondary | ICD-10-CM

## 2017-12-04 DIAGNOSIS — Z5111 Encounter for antineoplastic chemotherapy: Secondary | ICD-10-CM | POA: Diagnosis not present

## 2017-12-04 LAB — CBC WITH DIFFERENTIAL/PLATELET
Abs Immature Granulocytes: 0.04 10*3/uL (ref 0.00–0.07)
BASOS PCT: 0 %
Basophils Absolute: 0 10*3/uL (ref 0.0–0.1)
EOS ABS: 0 10*3/uL (ref 0.0–0.5)
EOS PCT: 1 %
HEMATOCRIT: 29.2 % — AB (ref 36.0–46.0)
Hemoglobin: 9.8 g/dL — ABNORMAL LOW (ref 12.0–15.0)
Immature Granulocytes: 1 %
Lymphocytes Relative: 63 %
Lymphs Abs: 2.8 10*3/uL (ref 0.7–4.0)
MCH: 29.9 pg (ref 26.0–34.0)
MCHC: 33.6 g/dL (ref 30.0–36.0)
MCV: 89 fL (ref 80.0–100.0)
MONO ABS: 0 10*3/uL — AB (ref 0.1–1.0)
MONOS PCT: 1 %
NEUTROS PCT: 34 %
Neutro Abs: 1.5 10*3/uL — ABNORMAL LOW (ref 1.7–7.7)
PLATELETS: 118 10*3/uL — AB (ref 150–400)
RBC: 3.28 MIL/uL — ABNORMAL LOW (ref 3.87–5.11)
RDW: 13.6 % (ref 11.5–15.5)
WBC: 4.4 10*3/uL (ref 4.0–10.5)
nRBC: 0 % (ref 0.0–0.2)

## 2017-12-04 LAB — GASTROINTESTINAL PANEL BY PCR, STOOL (REPLACES STOOL CULTURE)
ADENOVIRUS F40/41: NOT DETECTED
ASTROVIRUS: NOT DETECTED
Campylobacter species: NOT DETECTED
Cryptosporidium: NOT DETECTED
Cyclospora cayetanensis: NOT DETECTED
ENTAMOEBA HISTOLYTICA: NOT DETECTED
ENTEROAGGREGATIVE E COLI (EAEC): NOT DETECTED
ENTEROTOXIGENIC E COLI (ETEC): NOT DETECTED
Enteropathogenic E coli (EPEC): NOT DETECTED
GIARDIA LAMBLIA: NOT DETECTED
NOROVIRUS GI/GII: NOT DETECTED
Plesimonas shigelloides: NOT DETECTED
Rotavirus A: NOT DETECTED
Salmonella species: NOT DETECTED
Sapovirus (I, II, IV, and V): NOT DETECTED
Shiga like toxin producing E coli (STEC): NOT DETECTED
Shigella/Enteroinvasive E coli (EIEC): NOT DETECTED
VIBRIO CHOLERAE: NOT DETECTED
VIBRIO SPECIES: NOT DETECTED
Yersinia enterocolitica: NOT DETECTED

## 2017-12-04 LAB — COMPREHENSIVE METABOLIC PANEL
ALBUMIN: 3.2 g/dL — AB (ref 3.5–5.0)
ALK PHOS: 88 U/L (ref 38–126)
ALT: 47 U/L — AB (ref 0–44)
ANION GAP: 7 (ref 5–15)
AST: 38 U/L (ref 15–41)
BILIRUBIN TOTAL: 0.6 mg/dL (ref 0.3–1.2)
BUN: 23 mg/dL (ref 8–23)
CALCIUM: 8.8 mg/dL — AB (ref 8.9–10.3)
CO2: 20 mmol/L — AB (ref 22–32)
CREATININE: 1.15 mg/dL — AB (ref 0.44–1.00)
Chloride: 113 mmol/L — ABNORMAL HIGH (ref 98–111)
GFR calc Af Amer: 55 mL/min — ABNORMAL LOW (ref >60)
GFR calc non Af Amer: 48 mL/min — ABNORMAL LOW (ref >60)
GLUCOSE: 126 mg/dL — AB (ref 70–99)
Potassium: 3.7 mmol/L (ref 3.5–5.1)
Sodium: 140 mmol/L (ref 135–145)
TOTAL PROTEIN: 7.1 g/dL (ref 6.5–8.1)

## 2017-12-04 LAB — C DIFFICILE QUICK SCREEN W PCR REFLEX
C Diff antigen: POSITIVE — AB
C Diff toxin: NEGATIVE

## 2017-12-04 LAB — MAGNESIUM: Magnesium: 1.8 mg/dL (ref 1.7–2.4)

## 2017-12-04 LAB — CLOSTRIDIUM DIFFICILE BY PCR, REFLEXED: CDIFFPCR: POSITIVE — AB

## 2017-12-04 MED ORDER — PROCHLORPERAZINE EDISYLATE 10 MG/2ML IJ SOLN: 10.0000 mg | Freq: Once | INTRAMUSCULAR | Status: AC

## 2017-12-04 MED ORDER — PROCHLORPERAZINE EDISYLATE 10 MG/2ML IJ SOLN
10.0000 mg | Freq: Once | INTRAMUSCULAR | Status: AC
  Administered 2017-12-04: 10 mg via INTRAVENOUS
  Filled 2017-12-04: qty 2

## 2017-12-04 MED ORDER — PROCHLORPERAZINE EDISYLATE 10 MG/2ML IJ SOLN
10.0000 mg | Freq: Once | INTRAMUSCULAR | Status: DC
  Filled 2017-12-04: qty 2

## 2017-12-04 MED ORDER — DIPHENOXYLATE-ATROPINE 2.5-0.025 MG PO TABS: 2.0000 | ORAL_TABLET | Freq: Four times a day (QID) | ORAL | 0 refills | Status: DC | PRN

## 2017-12-04 MED ORDER — HEPARIN SOD (PORK) LOCK FLUSH 100 UNIT/ML IV SOLN
500.0000 [IU] | Freq: Once | INTRAVENOUS | Status: AC
  Administered 2017-12-04: 500 [IU] via INTRAVENOUS

## 2017-12-04 MED ORDER — SODIUM CHLORIDE 0.9% FLUSH
10.0000 mL | INTRAVENOUS | Status: DC | PRN
  Administered 2017-12-04: 10 mL via INTRAVENOUS
  Filled 2017-12-04: qty 10

## 2017-12-04 MED ORDER — VANCOMYCIN HCL 125 MG PO CAPS: 125.0000 mg | ORAL_CAPSULE | Freq: Four times a day (QID) | ORAL | 0 refills | Status: AC

## 2017-12-04 MED ORDER — SODIUM CHLORIDE 0.9 % IV SOLN
Freq: Once | INTRAVENOUS | Status: AC
  Administered 2017-12-04: 14:00:00 via INTRAVENOUS
  Filled 2017-12-04: qty 250

## 2017-12-04 NOTE — Progress Notes (Signed)
[facility]  [phone] [phone]  Patient Care Team: Glendon Axe, MD as PCP - General (Internal Medicine) Clent Jacks, RN as Registered Nurse Lloyd Huger, MD as Medical Oncologist (Medical Oncology)   Name of the patient: Cindy Bowman  [id number]  16-Oct-1950   Date of visit: 12/04/17  Diagnosis-stage Ib adenocarcinoma of the pancreas  Chief complaint/ Reason for visit- diarrhea & nausea  Heme/Onc history:    Pancreatic adenocarcinoma ([place])   10/18/2017 Initial Diagnosis    Pancreatic adenocarcinoma ([place])    10/26/2017 Cancer Staging    Staging form: Exocrine Pancreas, AJCC 8th Edition - Clinical stage from 10/26/2017: Stage IB (cT2, cN0, cM0) - Signed by Lloyd Huger, MD on 10/26/2017    11/06/2017 -  Chemotherapy    The patient had palonosetron (ALOXI) injection 0.25 mg, 0.25 mg, Intravenous,  Once, 2 of 4 cycles Administration: 0.25 mg (11/14/2017), 0.25 mg (11/28/2017) irinotecan (CAMPTOSAR) 340 mg in dextrose 5 % 500 mL chemo infusion, 180 mg/m2 = 340 mg, Intravenous,  Once, 2 of 4 cycles Dose modification: 160 mg/m2 (original dose 180 mg/m2, Cycle 2, Reason: Dose not tolerated) Administration: 340 mg (11/14/2017), 300 mg (11/28/2017) leucovorin 750 mg in dextrose 5 % 250 mL infusion, 760 mg, Intravenous,  Once, 2 of 4 cycles Dose modification: 360 mg/m2 (original dose 400 mg/m2, Cycle 2, Reason: Dose not tolerated) Administration: 750 mg (11/14/2017), 700 mg (11/28/2017) oxaliplatin (ELOXATIN) 150 mg in dextrose 5 % 500 mL chemo infusion, 160 mg, Intravenous,  Once, 2 of 4 cycles Dose modification: 70 mg/m2 (original dose 85 mg/m2, Cycle 2, Reason: Dose not tolerated) Administration: 150 mg (11/14/2017), 135 mg (11/28/2017) fluorouracil (ADRUCIL) chemo injection 750 mg, 400 mg/m2 = 750 mg, Intravenous,  Once, 2 of 4 cycles Dose modification: 360 mg/m2 (original dose 400 mg/m2, Cycle 2, Reason:  Dose not tolerated) Administration: 750 mg (11/14/2017), 700 mg (11/28/2017) fluorouracil (ADRUCIL) 4,550 mg in sodium chloride 0.9 % 59 mL chemo infusion, 2,400 mg/m2 = 4,550 mg, Intravenous, 1 Day/Dose, 2 of 4 cycles Dose modification: 2,000 mg/m2 (original dose 2,400 mg/m2, Cycle 2, Reason: Dose not tolerated) Administration: 4,550 mg (11/14/2017), 3,800 mg (11/28/2017)  for chemotherapy treatment.      HPI- Cindy Bowman, 67 year old female, with above history of stage I adenocarcinoma of the pancreas who presents to symptom management clinic for diarrhea and malaise after most recent cycle of chemotherapy.  Symptoms started approximately 2 weeks ago and have persisted since that time.  She rates diarrhea as approximately 6 times a day describes it as watery and occurs after she consumes any oral intake.  Describes color as representative of what ever she is eaten.  She is tried reducing oral intake to alleviate symptoms without improvement.  She has not taken antidiarrheals.  She has not been taking her stool softeners because of diarrhea.  Previously suffered from constipation.  She has not been evaluated for the symptoms previously.  She states that she is quite fatigued and exhausted because of the constant diarrhea.  Nothing seems to make symptoms better. Previously, she was hospitalized on 11/16/2017 for intractable nausea and vomiting secondary to chemotherapy and was treated with antiemetics.  She was discharged on 11/18/2017.  She currently takes Phenergan, Compazine, and Reglan for nausea which has improved her symptoms.  She denies any fever or chills.  Denies abdominal pain.  ECOG FS:2 - Symptomatic, <50% confined to bed  Review of systems- Review of Systems  Constitutional: Positive for malaise/fatigue and weight loss. Negative for chills and fever.  HENT: Negative for congestion, ear discharge, ear pain, sinus pain, sore throat and tinnitus.   Eyes: Negative.   Respiratory: Negative.   Negative for cough, sputum production and shortness of breath.   Cardiovascular: Negative for chest pain, palpitations, orthopnea, claudication and leg swelling.  Gastrointestinal: Positive for diarrhea and nausea. Negative for abdominal pain, blood in stool, constipation, heartburn and vomiting.  Genitourinary: Negative.   Musculoskeletal: Negative.   Skin: Negative.   Neurological: Positive for dizziness. Negative for tingling, weakness and headaches.  Endo/Heme/Allergies: Negative.   Psychiatric/Behavioral: Positive for depression. The patient is nervous/anxious and has insomnia (d/t diarrhea).     Current treatment-neoadjuvant FOLFIRINOX w/ plan for possible stereotactic XRT  Allergies  Allergen Reactions  . Nsaids Other (See Comments)    Decreased GFR  . Ibuprofen     Lowers kidney function  . Penicillins     Yeast infection  Has patient had a PCN reaction causing immediate rash, facial/tongue/throat swelling, SOB or lightheadedness with hypotension: No Has patient had a PCN reaction causing severe rash involving mucus membranes or skin necrosis: No Has patient had a PCN reaction that required hospitalization: No Has patient had a PCN reaction occurring within the last 10 years: No If all of the above answers are "NO", then may proceed with Cephalosporin use.     Past Medical History:  Diagnosis Date  . Essential hypertension   . Pancreatic cancer ([place])   . UTI (urinary tract infection)     Past Surgical History:  Procedure Laterality Date  . CESAREAN SECTION    . CHOLECYSTECTOMY    . EUS N/A 10/25/2017   Procedure: FULL UPPER ENDOSCOPIC ULTRASOUND (EUS) RADIAL;  Surgeon: Jola Schmidt, MD;  Location: [facility] ENDOSCOPY;  Service: Endoscopy;  Laterality: N/A;  . OOPHORECTOMY    . PORTA CATH INSERTION N/A 11/12/2017   Procedure: PORTA CATH INSERTION;  Surgeon: Algernon Huxley, MD;  Location: [facility];  Service: Cardiovascular;  Laterality: N/A;  . TUBAL LIGATION       Social History   Socioeconomic History  . Marital status: Single    Spouse name: Not on file  . Number of children: Not on file  . Years of education: Not on file  . Highest education level: Not on file  Occupational History  . Not on file  Social Needs  . Financial resource strain: Not on file  . Food insecurity:    Worry: Not on file    Inability: Not on file  . Transportation needs:    Medical: Not on file    Non-medical: Not on file  Tobacco Use  . Smoking status: Never Smoker  . Smokeless tobacco: Never Used  Substance and Sexual Activity  . Alcohol use: Not Currently  . Drug use: Never  . Sexual activity: Not on file  Lifestyle  . Physical activity:    Days per week: Not on file    Minutes per session: Not on file  . Stress: Not on file  Relationships  . Social connections:    Talks on phone: Not on file    Gets together: Not on file    Attends religious service: Not on file    Active member of club or organization: Not on file    Attends meetings of clubs or organizations: Not on file    Relationship status: Not on file  . Intimate partner violence:    Fear  of current or ex partner: Not on file    Emotionally abused: Not on file    Physically abused: Not on file    Forced sexual activity: Not on file  Other Topics Concern  . Not on file  Social History Narrative  . Not on file    Family History  Problem Relation Age of Onset  . Hypertension Mother        deceased 74  . Heart attack Father        deceased late 16s     Current Outpatient Medications:  .  loperamide (IMODIUM) 2 MG capsule, Take by mouth as needed for diarrhea or loose stools., Disp: , Rfl:  .  ondansetron (ZOFRAN ODT) 4 MG disintegrating tablet, Take 1 tablet (4 mg total) by mouth every 8 (eight) hours as needed for nausea or vomiting., Disp: 20 tablet, Rfl: 0 .  Oxycodone HCl 10 MG TABS, Take 1 tablet (10 mg total) by mouth every 6 (six) hours as needed., Disp: 60 tablet, Rfl:  0 .  prochlorperazine (COMPAZINE) 10 MG tablet, Take 1 tablet (10 mg total) by mouth every 6 (six) hours as needed (NAUSEA)., Disp: 60 tablet, Rfl: 2 .  diphenoxylate-atropine (LOMOTIL) 2.5-0.025 MG tablet, Take 2 tablets by mouth 4 (four) times daily as needed for diarrhea or loose stools. (Patient not taking: Reported on 12/06/2017), Disp: 60 tablet, Rfl: 0 .  docusate sodium (COLACE) 100 MG capsule, Take 2 capsules (200 mg total) by mouth 2 (two) times daily. (Patient not taking: Reported on 12/04/2017), Disp: 30 capsule, Rfl: 0 .  fentaNYL (DURAGESIC - DOSED MCG/HR) 25 MCG/HR patch, Place 1 patch (25 mcg total) onto the skin every 3 (three) days., Disp: 10 patch, Rfl: 0 .  lidocaine-prilocaine (EMLA) cream, Apply to affected area once, Disp: 30 g, Rfl: 3 .  losartan (COZAAR) 50 MG tablet, Take 1 tablet (50 mg total) by mouth daily., Disp: 30 tablet, Rfl: 0 .  metoCLOPramide (REGLAN) 10 MG tablet, Take 1 tablet (10 mg total) by mouth 3 (three) times daily with meals., Disp: 90 tablet, Rfl: 0 .  promethazine (PHENERGAN) 12.5 MG tablet, Take 1 tablet (12.5 mg total) by mouth 3 (three) times daily before meals., Disp: 30 tablet, Rfl: 0 .  vancomycin (VANCOCIN) 125 MG capsule, Take 1 capsule (125 mg total) by mouth every 6 (six) hours for 10 days. For treatment of c. Diff infection. (Patient not taking: Reported on 12/06/2017), Disp: 40 capsule, Rfl: 0 No current facility-administered medications for this visit.   Facility-Administered Medications Ordered in Other Visits:  .  prochlorperazine (COMPAZINE) injection 10 mg, 10 mg, Intravenous, Once, Rexene Agent  Physical exam:  Vitals:   12/04/17 1307 12/04/17 1334  BP: 127/75   Pulse: 64   Resp: 18   Temp:  97.9 F (36.6 C)  TempSrc:  Tympanic  SpO2: 99%    Physical Exam  Constitutional: She is oriented to person, place, and time. She appears well-developed and well-nourished.  Elderly female, fatigued appearing.  No acute distress.   Accompanied  HENT:  Head: Atraumatic.  Nose: Nose normal.  Mouth/Throat: Oropharynx is clear and moist. No oropharyngeal exudate.  Eyes: Conjunctivae are normal. No scleral icterus.  Neck: Normal range of motion.  Cardiovascular: Normal rate, regular rhythm and normal heart sounds.  Pulmonary/Chest: Effort normal and breath sounds normal.  Abdominal: Soft. Bowel sounds are normal. She exhibits no distension. There is no tenderness. There is no rebound.  Musculoskeletal: She exhibits no edema.  Neurological: She is alert and oriented to person, place, and time.  Skin: Skin is warm and dry.  Psychiatric: She has a normal mood and affect.    CMP Latest Ref Rng & Units 12/04/2017 11/28/2017  Glucose 70 - 99 mg/dL 126(H) 182(H)  BUN 8 - 23 mg/dL 23 24(H)  Creatinine 0.44 - 1.00 mg/dL 1.15(H) 1.60(H)  Sodium 135 - 145 mmol/L 140 138  Potassium 3.5 - 5.1 mmol/L 3.7 3.8  Chloride 98 - 111 mmol/L 113(H) 105  CO2 22 - 32 mmol/L 20(L) 22  Calcium 8.9 - 10.3 mg/dL 8.8(L) 8.9  Total Protein 6.5 - 8.1 g/dL 7.1 7.6  Total Bilirubin 0.3 - 1.2 mg/dL 0.6 0.7  Alkaline Phos 38 - 126 U/L 88 86  AST 15 - 41 U/L 38 29  ALT 0 - 44 U/L 47(H) 26   CBC Latest Ref Rng & Units 12/04/2017 11/28/2017  WBC 4.0 - 10.5 K/uL 4.4 5.1  Hemoglobin 12.0 - 15.0 g/dL 9.8(L) 9.8(L)  Hematocrit 36.0 - 46.0 % 29.2(L) 29.7(L)  Platelets 150 - 400 K/uL 118(L) 232    No images are attached to the encounter.  No results found.  Assessment and plan- Patient is a 67 y.o. female diagnosed with pancreatic cancer who presents to symptom management clinic for diarrhea.   1.  Stage Ib adenocarcinoma of the pancreas-diagnosed 09/2017 currently status post cycle 2 neoadjuvant FOLFIRINOX w/ Dr. Grayland Ormond.  Plan for possible stereotactic XRT prior to resection if she is felt to be a surgical candidate at that time.  Plans to reimage on 12/11/2017 to evaluate response given her overall poor tolerability to chemotherapy (see  below).  2.  Diarrhea-stool studies today positive for toxigenic C. Difficile.  GI panel negative.  K 3.7 -stable, magnesium 1.8-stable.  Given her acute symptoms and recent hospitalization I suspect acute infection and will treat with antibiotics. Dificid not covered by insurance so will start on oral vancomycin capsules.  Discussed holding antidiarrheal agents while we treat acute infection.  Discussed that we may consider antidiarrheals in the future for chemotherapy associated diarrhea.  In setting of pancreatic cancer I question if EPI is underlying.  With resolution of C. difficile infection would consider initiating Creon.   3.  Chemotherapy-induced neutropenia-afebrile today.  WBC 4.4, ANC 1.5 (slightly down).  Discussed with Dr. Grayland Ormond who recommends monitoring given absence of fever.    4. Dehydration- r/t poor oral intake and fluid loss d/t diarrhea.  IV fluids given in clinic with improvement in symptoms.  rtc in 2 days for re-check of labs and re-evaluation of diarrhea/tolerance of antibiotics/poss iv fluids   Visit Diagnosis 1. Pancreatic adenocarcinoma ([facility])   2. Clostridioides difficile diarrhea   3. Diarrhea, unspecified type   4. Dehydration   5. Chemotherapy induced neutropenia ([facility])     Patient expressed understanding and was in agreement with this plan. She also understands that She can call clinic at any time with any questions, concerns, or complaints.   Thank you for allowing me to participate in the care of this very pleasant patient.   Beckey Rutter, DNP, AGNP-C [place] [facility] [place] (work cell) [phone] (office)  Cc: Dr. Grayland Ormond

## 2017-12-04 NOTE — Telephone Encounter (Signed)
Received call from son, Cindy Bowman. Cindy Bowman has reported diarrhea throughout the weekend, not resolved with Imodium. She has been taking her Imodium along with Pepto-Bismol. She finds the Pepto more helpful but diarrhea does continue. Decreased intake of food/fluids throughout the weekend as well. She received IVF with pump stop on 10/11. Discussed with Dr. Grayland Ormond and Tillie Rung. Dr. Grayland Ormond will have her seen in [facility] today at 1300. Going forward, due to intolerance of modified FOLFIRINOX, he is going to postpone cycle 3 and perform CT scan now of CAP. Based on the results of scan, she may return to see surgeon or have regimen change to possibly Gemzar/Abraxane. Updated Cindy Bowman on plan of care and we will go into more detail regarding CT date/time once scheduled, as well as new appointment with Dr. Grayland Ormond for CT results and treatment plan.  Oncology Nurse Navigator Documentation  Navigator Location: [facility] (12/04/17 0900)   )Navigator Encounter Type: Telephone (12/04/17 0900) Telephone: Symptom Mgt;Incoming Call;Outgoing Call (12/04/17 0900)                                                  Time Spent with Patient: 30 (12/04/17 0900)

## 2017-12-04 NOTE — Patient Instructions (Signed)
Cindy Bowman,   I have sent prescription to your pharmacy for Lomotil which is for diarrhea. You can start this medication today. You received Compazine in clinic today. You can alternate zofran, phenergan, and compazine at home to help control the nausea. Please hold the Reglan. I will see you again on Thursday to re-evaluate your symptoms and you will see the dietician then as well. Please let me know if you have any questions or concerns. It was a pleasure meeting you today and thank you for allowing me to participate in your care.  Beckey Rutter, NP Food Choices to Help Relieve Diarrhea, Adult When you have diarrhea, the foods you eat and your eating habits are very important. Choosing the right foods and drinks can help:  Relieve diarrhea.  Replace lost fluids and nutrients.  Prevent dehydration.  What general guidelines should I follow? Relieving diarrhea  Choose foods with less than 2 g or .07 oz. of fiber per serving.  Limit fats to less than 8 tsp (38 g or 1.34 oz.) a day.  Avoid the following: ? Foods and beverages sweetened with high-fructose corn syrup, honey, or sugar alcohols such as xylitol, sorbitol, and mannitol. ? Foods that contain a lot of fat or sugar. ? Fried, greasy, or spicy foods. ? High-fiber grains, breads, and cereals. ? Raw fruits and vegetables.  Eat foods that are rich in probiotics. These foods include dairy products such as yogurt and fermented milk products. They help increase healthy bacteria in the stomach and intestines (gastrointestinal tract, or GI tract).  If you have lactose intolerance, avoid dairy products. These may make your diarrhea worse.  Take medicine to help stop diarrhea (antidiarrheal medicine) only as told by your health care provider. Replacing nutrients  Eat small meals or snacks every 3-4 hours.  Eat bland foods, such as white rice, toast, or baked potato, until your diarrhea starts to get better. Gradually reintroduce  nutrient-rich foods as tolerated or as told by your health care provider. This includes: ? Well-cooked protein foods. ? Peeled, seeded, and soft-cooked fruits and vegetables. ? Low-fat dairy products.  Take vitamin and mineral supplements as told by your health care provider. Preventing dehydration   Start by sipping water or a special solution to prevent dehydration (oral rehydration solution, ORS). Urine that is clear or pale yellow means that you are getting enough fluid.  Try to drink at least 8-10 cups of fluid each day to help replace lost fluids.  You may add other liquids in addition to water, such as clear juice or decaffeinated sports drinks, as tolerated or as told by your health care provider.  Avoid drinks with caffeine, such as coffee, tea, or soft drinks.  Avoid alcohol. What foods are recommended? The items listed may not be a complete list. Talk with your health care provider about what dietary choices are best for you. Grains White rice. White, [place], or pita breads (fresh or toasted), including plain rolls, buns, or bagels. White pasta. Saltine, soda, or graham crackers. Pretzels. Low-fiber cereal. Cooked cereals made with water (such as cornmeal, farina, or cream cereals). Plain muffins. Matzo. Melba toast. Zwieback. Vegetables Potatoes (without the skin). Most well-cooked and canned vegetables without skins or seeds. Tender lettuce. Fruits Apple sauce. Fruits canned in juice. Cooked apricots, cherries, grapefruit, peaches, pears, or plums. Fresh bananas and cantaloupe. Meats and other protein foods Baked or boiled chicken. Eggs. Tofu. Fish. Seafood. Smooth nut butters. Ground or well-cooked tender beef, ham, veal, lamb, pork,  or poultry. Dairy Plain yogurt, kefir, and unsweetened liquid yogurt. Lactose-free milk, buttermilk, skim milk, or soy milk. Low-fat or nonfat hard cheese. Beverages Water. Low-calorie sports drinks. Fruit juices without pulp. Strained tomato  and vegetable juices. Decaffeinated teas. Sugar-free beverages not sweetened with sugar alcohols. Oral rehydration solutions, if approved by your health care provider. Seasoning and other foods Bouillon, broth, or soups made from recommended foods. What foods are not recommended? The items listed may not be a complete list. Talk with your health care provider about what dietary choices are best for you. Grains Whole grain, whole wheat, bran, or rye breads, rolls, pastas, and crackers. Wild or brown rice. Whole grain or bran cereals. Barley. Oats and oatmeal. Corn tortillas or taco shells. Granola. Popcorn. Vegetables Raw vegetables. Fried vegetables. Cabbage, broccoli, Brussels sprouts, artichokes, baked beans, beet greens, corn, kale, legumes, peas, sweet potatoes, and yams. Potato skins. Cooked spinach and cabbage. Fruits Dried fruit, including raisins and dates. Raw fruits. Stewed or dried prunes. Canned fruits with syrup. Meat and other protein foods Fried or fatty meats. Deli meats. Chunky nut butters. Nuts and seeds. Beans and lentils. Berniece Salines. Hot dogs. Sausage. Dairy High-fat cheeses. Whole milk, chocolate milk, and beverages made with milk, such as milk shakes. Half-and-half. Cream. sour cream. Ice cream. Beverages Caffeinated beverages (such as coffee, tea, soda, or energy drinks). Alcoholic beverages. Fruit juices with pulp. Prune juice. Soft drinks sweetened with high-fructose corn syrup or sugar alcohols. High-calorie sports drinks. Fats and oils Butter. Cream sauces. Margarine. Salad oils. Plain salad dressings. Olives. Avocados. Mayonnaise. Sweets and desserts Sweet rolls, doughnuts, and sweet breads. Sugar-free desserts sweetened with sugar alcohols such as xylitol and sorbitol. Seasoning and other foods Honey. Hot sauce. Chili powder. Gravy. Cream-based or milk-based soups. Pancakes and waffles. Summary  When you have diarrhea, the foods you eat and your eating habits are very  important.  Make sure you get at least 8-10 cups of fluid each day, or enough to keep your urine clear or pale yellow.  Eat bland foods and gradually reintroduce healthy, nutrient-rich foods as tolerated, or as told by your health care provider.  Avoid high-fiber, fried, greasy, or spicy foods. This information is not intended to replace advice given to you by your health care provider. Make sure you discuss any questions you have with your health care provider. Document Released: 04/29/2003 Document Revised: 02/04/2016 Document Reviewed: 02/04/2016 Elsevier Interactive Patient Education  [employer].

## 2017-12-06 ENCOUNTER — Other Ambulatory Visit: Payer: Self-pay

## 2017-12-06 ENCOUNTER — Inpatient Hospital Stay: Payer: Medicare Other

## 2017-12-06 ENCOUNTER — Encounter: Payer: Self-pay | Admitting: Nurse Practitioner

## 2017-12-06 ENCOUNTER — Inpatient Hospital Stay (HOSPITAL_BASED_OUTPATIENT_CLINIC_OR_DEPARTMENT_OTHER): Payer: Medicare Other | Admitting: Nurse Practitioner

## 2017-12-06 VITALS — BP 133/80 | HR 91 | Temp 96.2°F | Resp 18

## 2017-12-06 DIAGNOSIS — A09 Infectious gastroenteritis and colitis, unspecified: Secondary | ICD-10-CM | POA: Diagnosis not present

## 2017-12-06 DIAGNOSIS — C259 Malignant neoplasm of pancreas, unspecified: Secondary | ICD-10-CM

## 2017-12-06 DIAGNOSIS — R110 Nausea: Secondary | ICD-10-CM

## 2017-12-06 DIAGNOSIS — A0472 Enterocolitis due to Clostridium difficile, not specified as recurrent: Secondary | ICD-10-CM | POA: Diagnosis not present

## 2017-12-06 DIAGNOSIS — E43 Unspecified severe protein-calorie malnutrition: Secondary | ICD-10-CM

## 2017-12-06 DIAGNOSIS — Z5111 Encounter for antineoplastic chemotherapy: Secondary | ICD-10-CM | POA: Diagnosis not present

## 2017-12-06 DIAGNOSIS — D701 Agranulocytosis secondary to cancer chemotherapy: Secondary | ICD-10-CM | POA: Diagnosis not present

## 2017-12-06 DIAGNOSIS — T451X5A Adverse effect of antineoplastic and immunosuppressive drugs, initial encounter: Secondary | ICD-10-CM

## 2017-12-06 LAB — CBC WITH DIFFERENTIAL/PLATELET
Abs Immature Granulocytes: 0.01 10*3/uL (ref 0.00–0.07)
BASOS ABS: 0 10*3/uL (ref 0.0–0.1)
Basophils Relative: 0 %
EOS ABS: 0 10*3/uL (ref 0.0–0.5)
EOS PCT: 1 %
HCT: 28.1 % — ABNORMAL LOW (ref 36.0–46.0)
HEMOGLOBIN: 9.6 g/dL — AB (ref 12.0–15.0)
Immature Granulocytes: 1 %
Lymphocytes Relative: 91 %
Lymphs Abs: 1.5 10*3/uL (ref 0.7–4.0)
MCH: 30.7 pg (ref 26.0–34.0)
MCHC: 34.2 g/dL (ref 30.0–36.0)
MCV: 89.8 fL (ref 80.0–100.0)
Monocytes Absolute: 0 10*3/uL — ABNORMAL LOW (ref 0.1–1.0)
Monocytes Relative: 1 %
NEUTROS PCT: 6 %
NRBC: 0 % (ref 0.0–0.2)
Neutro Abs: 0.1 10*3/uL — ABNORMAL LOW (ref 1.7–7.7)
Platelets: 74 10*3/uL — ABNORMAL LOW (ref 150–400)
RBC: 3.13 MIL/uL — ABNORMAL LOW (ref 3.87–5.11)
RDW: 14 % (ref 11.5–15.5)
SMEAR REVIEW: NORMAL
WBC: 1.6 10*3/uL — AB (ref 4.0–10.5)

## 2017-12-06 LAB — COMPREHENSIVE METABOLIC PANEL
ALK PHOS: 85 U/L (ref 38–126)
ALT: 46 U/L — AB (ref 0–44)
ANION GAP: 8 (ref 5–15)
AST: 35 U/L (ref 15–41)
Albumin: 3.2 g/dL — ABNORMAL LOW (ref 3.5–5.0)
BUN: 17 mg/dL (ref 8–23)
CALCIUM: 9 mg/dL (ref 8.9–10.3)
CO2: 20 mmol/L — AB (ref 22–32)
Chloride: 112 mmol/L — ABNORMAL HIGH (ref 98–111)
Creatinine, Ser: 1.31 mg/dL — ABNORMAL HIGH (ref 0.44–1.00)
GFR calc Af Amer: 47 mL/min — ABNORMAL LOW (ref >60)
GFR, EST NON AFRICAN AMERICAN: 41 mL/min — AB (ref >60)
Glucose, Bld: 130 mg/dL — ABNORMAL HIGH (ref 70–99)
Potassium: 3.7 mmol/L (ref 3.5–5.1)
Sodium: 140 mmol/L (ref 135–145)
Total Bilirubin: 0.6 mg/dL (ref 0.3–1.2)
Total Protein: 7 g/dL (ref 6.5–8.1)

## 2017-12-06 LAB — MAGNESIUM: Magnesium: 1.8 mg/dL (ref 1.7–2.4)

## 2017-12-06 MED ORDER — SODIUM CHLORIDE 0.9 % IV SOLN
Freq: Once | INTRAVENOUS | Status: AC
  Administered 2017-12-06: 11:00:00 via INTRAVENOUS
  Filled 2017-12-06: qty 250

## 2017-12-06 NOTE — Progress Notes (Signed)
Nutrition Assessment  Referral for weight loss  67 year old female with pancreatic cancer stage I. Patient receiving neoadjuvant folfirinox.  Past medical history of HTN.  Noted see in [facility] and noted to be c-diff positive.    Met with patient during IV fluid infusion in [facility].  Patient has not started antibiotic due to pharmacy having to order it.  Patient reports poor appetite since April but especially poor with starting chemotherapy and now with c-diff.  "Everything I eat even drinking water runs right through me."  Patient has been trying to eat bagels, drink juice, gatorade, cheese toast soups, ensure original.  Reports after chemo had issues with nausea but most recent is diarrhea.     Medications: reglan not taking per patient Compazine, phenergan  Labs: BUN 17, creatinine 1.31  Anthropometrics:   Height: 63 inches Weight: 161 lb on 10/9 UBW: 188 lb in April per patient BMI: 28  14% weight loss in the last 6 months, significant   Estimated Energy Needs  Kcals: 1825-2100  Protein: 91-105 g Fluid: 2.1 L  NUTRITION DIAGNOSIS: Malnutrition related to cancer and cancer related treatments as evidenced by 14% weight loss in 6 months and eating < 75% of energy needs in the last 6 months   MALNUTRITION DIAGNOSIS: Patient meets criteria for severe malnutrition in context of chronic illness as evidenced by 14% weight loss in 6 months and eating < 75% energy intake for > or equal to 1 month   INTERVENTION:  Discussed strategies to help with nausea, vomiting (fact sheet given) and diarrhea (food choices not likely to make difference with +c-diff). Discussed hydration as well and products to help with hydration. Samples given of oral nutrition supplements Contact information given Offered face to face visit but preferred phone call as treatment days not on days when RD is in clinic    MONITORING, EVALUATION, GOAL: weight trends, intake   NEXT VISIT: phone follow-up per patient  request, declined face to face visit  October 31  Kazaria Gaertner B. Zenia Resides, [place], [place] Registered Dietitian [phone] (pager)

## 2017-12-06 NOTE — Progress Notes (Signed)
Symptom Management [facility]  [phone] [phone]  Patient Care Team: Glendon Axe, MD as PCP - General (Internal Medicine) Clent Jacks, RN as Registered Nurse Lloyd Huger, MD as Medical Oncologist (Medical Oncology)   Name of the patient: Cindy Bowman  [id number]  10/16/1950   Date of visit: 12/06/17  Diagnosis-stage Ib adenocarcinoma of the pancreas  Chief complaint/ Reason for visit-diarrhea  Heme/Onc history:    Pancreatic adenocarcinoma ([facility])   10/18/2017 Initial Diagnosis    Pancreatic adenocarcinoma ([facility])    10/26/2017 Cancer Staging    Staging form: Exocrine Pancreas, AJCC 8th Edition - Clinical stage from 10/26/2017: Stage IB (cT2, cN0, cM0) - Signed by Lloyd Huger, MD on 10/26/2017    11/06/2017 -  Chemotherapy    The patient had palonosetron (ALOXI) injection 0.25 mg, 0.25 mg, Intravenous,  Once, 2 of 4 cycles Administration: 0.25 mg (11/14/2017), 0.25 mg (11/28/2017) irinotecan (CAMPTOSAR) 340 mg in dextrose 5 % 500 mL chemo infusion, 180 mg/m2 = 340 mg, Intravenous,  Once, 2 of 4 cycles Dose modification: 160 mg/m2 (original dose 180 mg/m2, Cycle 2, Reason: Dose not tolerated) Administration: 340 mg (11/14/2017), 300 mg (11/28/2017) leucovorin 750 mg in dextrose 5 % 250 mL infusion, 760 mg, Intravenous,  Once, 2 of 4 cycles Dose modification: 360 mg/m2 (original dose 400 mg/m2, Cycle 2, Reason: Dose not tolerated) Administration: 750 mg (11/14/2017), 700 mg (11/28/2017) oxaliplatin (ELOXATIN) 150 mg in dextrose 5 % 500 mL chemo infusion, 160 mg, Intravenous,  Once, 2 of 4 cycles Dose modification: 70 mg/m2 (original dose 85 mg/m2, Cycle 2, Reason: Dose not tolerated) Administration: 150 mg (11/14/2017), 135 mg (11/28/2017) fluorouracil (ADRUCIL) chemo injection 750 mg, 400 mg/m2 = 750 mg, Intravenous,  Once, 2 of 4 cycles Dose modification: 360 mg/m2 (original dose 400 mg/m2, Cycle 2, Reason: Dose not  tolerated) Administration: 750 mg (11/14/2017), 700 mg (11/28/2017) fluorouracil (ADRUCIL) 4,550 mg in sodium chloride 0.9 % 59 mL chemo infusion, 2,400 mg/m2 = 4,550 mg, Intravenous, 1 Day/Dose, 2 of 4 cycles Dose modification: 2,000 mg/m2 (original dose 2,400 mg/m2, Cycle 2, Reason: Dose not tolerated) Administration: 4,550 mg (11/14/2017), 3,800 mg (11/28/2017)  for chemotherapy treatment.      Interval history- Cindy Bowman, 67 year old female with above history of pancreatic cancer, presents to symptom management clinic with diarrhea. Onset of diarrhea was approximately 1 week ago, describes diarrhea as watery occurring after all oral intake.  She reports stool frequency of 6-8 times per day.  Describes stool volume is moderate.  It is unchanged since onset.  Denies bleeding with bowel movements.  Associated symptoms: Poor oral intake, malaise and fatigue, depression.  Denies abdominal pain.  Occurs with all food intake.  She was previously evaluated for similar symptoms on 12/04/2017.  Work-up included C. difficile panel which was positive for toxigenic C. difficile by PCR.  Given that she was symptomatic, oral vancomycin was started.  Today, she reports that the pharmacy had to order the medication and she has been am unable to start the antibiotic.  In interim, she continues to have diarrhea constantly.  She states that she felt better after IV fluids earlier this week but is frustrated with ongoing symptoms.  She requests assistance in getting her medication.  She denies any fever or chills.  ECOG FS:1 - Symptomatic but completely ambulatory  Review of systems- Review of Systems  Constitutional: Positive for malaise/fatigue. Negative for chills, fever and weight loss.  HENT: Negative for congestion,  ear discharge, ear pain, sinus pain, sore throat and tinnitus.   Eyes: Negative.   Respiratory: Negative.  Negative for cough, sputum production and shortness of breath.   Cardiovascular: Negative  for chest pain, palpitations, orthopnea, claudication and leg swelling.  Gastrointestinal: Positive for diarrhea and nausea. Negative for abdominal pain, blood in stool, constipation, heartburn and vomiting.  Genitourinary: Negative.   Musculoskeletal: Negative.   Skin: Negative.   Neurological: Negative for dizziness, tingling, weakness and headaches.  Endo/Heme/Allergies: Negative.   Psychiatric/Behavioral: Negative.      Current treatment- s/p cycle 2 FOLFIRINOX on 11/28/17.   Allergies  Allergen Reactions  . Nsaids Other (See Comments)    Decreased GFR  . Ibuprofen     Lowers kidney function  . Penicillins     Yeast infection  Has patient had a PCN reaction causing immediate rash, facial/tongue/throat swelling, SOB or lightheadedness with hypotension: No Has patient had a PCN reaction causing severe rash involving mucus membranes or skin necrosis: No Has patient had a PCN reaction that required hospitalization: No Has patient had a PCN reaction occurring within the last 10 years: No If all of the above answers are "NO", then may proceed with Cephalosporin use.     Past Medical History:  Diagnosis Date  . Essential hypertension   . Pancreatic cancer ([facility])   . UTI (urinary tract infection)     Past Surgical History:  Procedure Laterality Date  . CESAREAN SECTION    . CHOLECYSTECTOMY    . EUS N/A 10/25/2017   Procedure: FULL UPPER ENDOSCOPIC ULTRASOUND (EUS) RADIAL;  Surgeon: Jola Schmidt, MD;  Location: [facility] ENDOS[facility];  Service: Endoscopy;  Laterality: N/A;  . OOPHORECTOMY    . PORTA CATH INSERTION N/A 11/12/2017   Procedure: PORTA CATH INSERTION;  Surgeon: Algernon Huxley, MD;  Location: [facility];  Service: Cardiovascular;  Laterality: N/A;  . TUBAL LIGATION      Social History   Socioeconomic History  . Marital status: Single    Spouse name: Not on file  . Number of children: Not on file  . Years of education: Not on file  . Highest education level: Not  on file  Occupational History  . Not on file  Social Needs  . Financial resource strain: Not on file  . Food insecurity:    Worry: Not on file    Inability: Not on file  . Transportation needs:    Medical: Not on file    Non-medical: Not on file  Tobacco Use  . Smoking status: Never Smoker  . Smokeless tobacco: Never Used  Substance and Sexual Activity  . Alcohol use: Not Currently  . Drug use: Never  . Sexual activity: Not on file  Lifestyle  . Physical activity:    Days per week: Not on file    Minutes per session: Not on file  . Stress: Not on file  Relationships  . Social connections:    Talks on phone: Not on file    Gets together: Not on file    Attends religious service: Not on file    Active member of club or organization: Not on file    Attends meetings of clubs or organizations: Not on file    Relationship status: Not on file  . Intimate partner violence:    Fear of current or ex partner: Not on file    Emotionally abused: Not on file    Physically abused: Not on file    Forced sexual  activity: Not on file  Other Topics Concern  . Not on file  Social History Narrative  . Not on file    Family History  Problem Relation Age of Onset  . Hypertension Mother        deceased 65  . Heart attack Father        deceased late 60s     Current Outpatient Medications:  .  fentaNYL (DURAGESIC - DOSED MCG/HR) 25 MCG/HR patch, Place 1 patch (25 mcg total) onto the skin every 3 (three) days., Disp: 10 patch, Rfl: 0 .  lidocaine-prilocaine (EMLA) cream, Apply to affected area once, Disp: 30 g, Rfl: 3 .  metoCLOPramide (REGLAN) 10 MG tablet, Take 1 tablet (10 mg total) by mouth 3 (three) times daily with meals., Disp: 90 tablet, Rfl: 0 .  ondansetron (ZOFRAN ODT) 4 MG disintegrating tablet, Take 1 tablet (4 mg total) by mouth every 8 (eight) hours as needed for nausea or vomiting., Disp: 20 tablet, Rfl: 0 .  Oxycodone HCl 10 MG TABS, Take 1 tablet (10 mg total) by mouth  every 6 (six) hours as needed., Disp: 60 tablet, Rfl: 0 .  prochlorperazine (COMPAZINE) 10 MG tablet, Take 1 tablet (10 mg total) by mouth every 6 (six) hours as needed (NAUSEA)., Disp: 60 tablet, Rfl: 2 .  promethazine (PHENERGAN) 12.5 MG tablet, Take 1 tablet (12.5 mg total) by mouth 3 (three) times daily before meals., Disp: 30 tablet, Rfl: 0 .  diphenoxylate-atropine (LOMOTIL) 2.5-0.025 MG tablet, Take 2 tablets by mouth 4 (four) times daily as needed for diarrhea or loose stools. (Patient not taking: Reported on 12/06/2017), Disp: 60 tablet, Rfl: 0 .  docusate sodium (COLACE) 100 MG capsule, Take 2 capsules (200 mg total) by mouth 2 (two) times daily. (Patient not taking: Reported on 12/04/2017), Disp: 30 capsule, Rfl: 0 .  loperamide (IMODIUM) 2 MG capsule, Take by mouth as needed for diarrhea or loose stools., Disp: , Rfl:  .  losartan (COZAAR) 50 MG tablet, Take 1 tablet (50 mg total) by mouth daily., Disp: 30 tablet, Rfl: 0 .  vancomycin (VANCOCIN) 125 MG capsule, Take 1 capsule (125 mg total) by mouth every 6 (six) hours for 10 days. For treatment of c. Diff infection. (Patient not taking: Reported on 12/06/2017), Disp: 40 capsule, Rfl: 0 No current facility-administered medications for this visit.   Facility-Administered Medications Ordered in Other Visits:  .  prochlorperazine (COMPAZINE) injection 10 mg, 10 mg, Intravenous, Once, Rexene Agent  Physical exam:  Vitals:   12/06/17 1034  BP: 133/80  Pulse: 91  Resp: 18  Temp: (!) 96.2 F (35.7 C)  TempSrc: Tympanic   Physical Exam  Constitutional: She is oriented to person, place, and time. She appears well-developed and well-nourished. No distress.  Elderly female seen laying in recliner in exam room.  Unaccompanied.  HENT:  Head: Atraumatic.  Nose: Nose normal.  Mouth/Throat: Oropharynx is clear and moist. No oropharyngeal exudate.  Eyes: Conjunctivae are normal. No scleral icterus.  Neck: Normal range of motion.    Cardiovascular: Normal rate, regular rhythm and normal heart sounds.  Pulmonary/Chest: Effort normal and breath sounds normal.  Abdominal: Soft. Bowel sounds are normal.  Musculoskeletal: She exhibits no edema.  Neurological: She is alert and oriented to person, place, and time.  Skin: Skin is warm and dry.  Psychiatric: Her speech is normal and behavior is normal. Thought content normal. She exhibits a depressed mood.     CMP Latest Ref Rng & Units  12/06/2017  Glucose 70 - 99 mg/dL 130(H)  BUN 8 - 23 mg/dL 17  Creatinine 0.44 - 1.00 mg/dL 1.31(H)  Sodium 135 - 145 mmol/L 140  Potassium 3.5 - 5.1 mmol/L 3.7  Chloride 98 - 111 mmol/L 112(H)  CO2 22 - 32 mmol/L 20(L)  Calcium 8.9 - 10.3 mg/dL 9.0  Total Protein 6.5 - 8.1 g/dL 7.0  Total Bilirubin 0.3 - 1.2 mg/dL 0.6  Alkaline Phos 38 - 126 U/L 85  AST 15 - 41 U/L 35  ALT 0 - 44 U/L 46(H)   CBC Latest Ref Rng & Units 12/06/2017  WBC 4.0 - 10.5 K/uL 1.6(L)  Hemoglobin 12.0 - 15.0 g/dL 9.6(L)  Hematocrit 36.0 - 46.0 % 28.1(L)  Platelets 150 - 400 K/uL 74(L)    No images are attached to the encounter.  No results found.  Assessment and plan- Patient is a 67 y.o. female diagnosed with pancreatic cancer who presents to symptom management clinic for C. difficile and neutropenia.  1.  Stage Ib adenocarcinoma of the pancreas-diagnosed 09/2017.  Currently status post cycle 2 neoadjuvant FOLFIRINOX with plan for possible stereotactic XRT prior to resection if she is felt to be a surgical candidate.  Plans to reimage on 12/11/2017 to evaluate response given overall poor tolerability (see below) to chemotherapy.  2. Clostridioides difficile infection-toxigenic C. difficile infection by PCR.  Dificid not covered by insurance.  Was previously started on oral vancomycin but patient has not picked up d/t issues at pharmacy.  Nursing has contacted pharmacy and coordinated medication to be available to patient today.  In setting of neutropenia,  discussed with Dr. Grayland Ormond who recommends patient initiate oral vancomycin as prescribed today.  Does not recommend hospitalization at this time.  Patient advised that if she is not able to tolerate antibiotics or has persistent symptoms to please call clinic for reevaluation.  3.  Chemotherapy-induced neutropenia-WBC 1.6, ANC 0.1.  Afebrile.  Discussed with Dr. Grayland Ormond who recommends holding granix.  Neutropenic precautions discussed.  4.  Chemotherapy-induced nausea and vomiting- hospitalization after cycle 1 due to intractable nausea and vomiting secondary to chemotherapy, now improved.  Continue home antiemetics.  Continue to hold Reglan.  5.  Malnutrition-severe.  Related to malignancy and cancer related treatments.  Follow-up with Jennet Maduro, RD today as scheduled.   Patient advised to notify the clinic if there is no improvement in symptoms or if symptoms worsen in next 3-4 days.  Follow-up with Dr. Grayland Ormond as scheduled for discussion of results and follow-up.   Visit Diagnosis 1. Pancreatic adenocarcinoma ([facility])   2. Clostridioides difficile diarrhea   3. Diarrhea of infectious origin   4. Chemotherapy induced neutropenia ([facility])   5. Chemotherapy-induced nausea   6. Severe protein-calorie malnutrition ([place])     Patient expressed understanding and was in agreement with this plan. She also understands that She can call clinic at any time with any questions, concerns, or complaints.   Thank you for allowing me to participate in the care of this very pleasant patient.   Beckey Rutter, DNP, AGNP-C [facility] [phone] (work cell) [phone] (office)

## 2017-12-12 ENCOUNTER — Ambulatory Visit: Payer: Medicare Other | Admitting: Oncology

## 2017-12-12 ENCOUNTER — Other Ambulatory Visit: Payer: Medicare Other

## 2017-12-12 ENCOUNTER — Ambulatory Visit
Admission: RE | Admit: 2017-12-12 | Discharge: 2017-12-12 | Disposition: A | Discharge status: Home / Self Care | Payer: Medicare Other | Source: Ambulatory Visit | Attending: Oncology | Admitting: Oncology

## 2017-12-12 ENCOUNTER — Ambulatory Visit: Admission: RE | Admit: 2017-12-12 | Payer: Medicare Other | Source: Ambulatory Visit

## 2017-12-12 ENCOUNTER — Ambulatory Visit: Payer: Medicare Other | Admitting: Hospice and Palliative Medicine

## 2017-12-12 ENCOUNTER — Ambulatory Visit: Payer: Medicare Other

## 2017-12-12 DIAGNOSIS — K5730 Diverticulosis of large intestine without perforation or abscess without bleeding: Secondary | ICD-10-CM | POA: Insufficient documentation

## 2017-12-12 DIAGNOSIS — D701 Agranulocytosis secondary to cancer chemotherapy: Secondary | ICD-10-CM | POA: Insufficient documentation

## 2017-12-12 DIAGNOSIS — K8681 Exocrine pancreatic insufficiency: Secondary | ICD-10-CM | POA: Diagnosis not present

## 2017-12-12 DIAGNOSIS — N361 Urethral diverticulum: Secondary | ICD-10-CM | POA: Insufficient documentation

## 2017-12-12 DIAGNOSIS — C259 Malignant neoplasm of pancreas, unspecified: Secondary | ICD-10-CM | POA: Diagnosis not present

## 2017-12-12 DIAGNOSIS — A0472 Enterocolitis due to Clostridium difficile, not specified as recurrent: Secondary | ICD-10-CM | POA: Insufficient documentation

## 2017-12-12 DIAGNOSIS — N2889 Other specified disorders of kidney and ureter: Secondary | ICD-10-CM | POA: Insufficient documentation

## 2017-12-12 DIAGNOSIS — I700 Atherosclerosis of aorta: Secondary | ICD-10-CM | POA: Insufficient documentation

## 2017-12-12 DIAGNOSIS — T451X5A Adverse effect of antineoplastic and immunosuppressive drugs, initial encounter: Secondary | ICD-10-CM

## 2017-12-12 MED ORDER — IOPAMIDOL (ISOVUE-300) INJECTION 61%
100.0000 mL | Freq: Once | INTRAVENOUS | Status: AC | PRN
  Administered 2017-12-12: 100 mL via INTRAVENOUS

## 2017-12-13 ENCOUNTER — Other Ambulatory Visit: Payer: Self-pay | Admitting: *Deleted

## 2017-12-13 ENCOUNTER — Telehealth: Payer: Self-pay

## 2017-12-13 MED ORDER — OXYCODONE HCL 10 MG PO TABS: 10.0000 mg | ORAL_TABLET | Freq: Four times a day (QID) | ORAL | 0 refills | Status: DC | PRN

## 2017-12-13 MED ORDER — FENTANYL 25 MCG/HR TD PT72: 25.0000 ug | MEDICATED_PATCH | TRANSDERMAL | 0 refills | Status: DC

## 2017-12-13 NOTE — Telephone Encounter (Signed)
Prescriptions sent to Dr. Grayland Ormond, pharmacy updated.

## 2017-12-13 NOTE — Telephone Encounter (Signed)
Received call from Ms. Nickolas with request for refill on oxycodone and fentanyl patches. She has enough to ger through the weekend and is requesting now to due authorization process. [facility] [address] [facility] [place]. Oncology Nurse Navigator Documentation  Navigator Location: [facility] (12/13/17 1100)   )Navigator Encounter Type: Telephone (12/13/17 1100) Telephone: Incoming Call (12/13/17 1100)                                                  Time Spent with Patient: 15 (12/13/17 1100)

## 2017-12-16 NOTE — Progress Notes (Signed)
[place]  Telephone:([phone]) [phone] Fax:(336) [phone]  ID: Cindy Bowman OB: Jul 17, 1950  MR#: [id number]  QIO#:[id number]  Patient Care Team: Glendon Axe, MD as PCP - General (Internal Medicine) Clent Jacks, RN as Registered Nurse Lloyd Huger, MD as Medical Oncologist (Medical Oncology)  CHIEF COMPLAINT: Clinical stage Ib pancreatic adenocarcinoma.  INTERVAL HISTORY: Patient returns to clinic today for further evaluation, discussion of her imaging results, and treatment planning.  She continues to have a decreased performance status secondary to her treatments.  She has increased weakness and fatigue.  She has a poor appetite. Her pain is better controlled on her current narcotic regimen.  She denies any fevers.  She denies any chest pain or shortness of breath.  She has no nausea, vomiting, constipation, or diarrhea.  She has no urinary complaints.  Patient offers no further specific complaints today.  REVIEW OF SYSTEMS:   Review of Systems  Constitutional: Positive for malaise/fatigue. Negative for fever and weight loss.  Respiratory: Negative.  Negative for cough and shortness of breath.   Cardiovascular: Negative.  Negative for chest pain and leg swelling.  Gastrointestinal: Negative.  Negative for abdominal pain, blood in stool, melena, nausea and vomiting.  Genitourinary: Negative.  Negative for dysuria.  Musculoskeletal: Negative.  Negative for back pain.  Skin: Negative.  Negative for rash.  Neurological: Positive for weakness. Negative for dizziness, focal weakness and headaches.  Psychiatric/Behavioral: Negative.  The patient is not nervous/anxious.     As per HPI. Otherwise, a complete review of systems is negative.  PAST MEDICAL HISTORY: Past Medical History:  Diagnosis Date  . Essential hypertension   . Pancreatic cancer ([place])   . UTI (urinary tract infection)     PAST SURGICAL HISTORY: Past Surgical History:  Procedure  Laterality Date  . CESAREAN SECTION    . CHOLECYSTECTOMY    . EUS N/A 10/25/2017   Procedure: FULL UPPER ENDOSCOPIC ULTRASOUND (EUS) RADIAL;  Surgeon: Jola Schmidt, MD;  Location: [facility] ENDOSCOPY;  Service: Endoscopy;  Laterality: N/A;  . OOPHORECTOMY    . PORTA CATH INSERTION N/A 11/12/2017   Procedure: PORTA CATH INSERTION;  Surgeon: Algernon Huxley, MD;  Location: [place] [facility];  Service: Cardiovascular;  Laterality: N/A;  . TUBAL LIGATION      FAMILY HISTORY: Family History  Problem Relation Age of Onset  . Hypertension Mother        deceased 24  . Heart attack Father        deceased late 71s    ADVANCED DIRECTIVES (Y/N):  N  HEALTH MAINTENANCE: Social History   Tobacco Use  . Smoking status: Never Smoker  . Smokeless tobacco: Never Used  Substance Use Topics  . Alcohol use: Not Currently  . Drug use: Never     Colonoscopy:  PAP:  Bone density:  Lipid panel:  Allergies  Allergen Reactions  . Nsaids Other (See Comments)    Decreased GFR  . Ibuprofen     Lowers kidney function  . Penicillins     Yeast infection  Has patient had a PCN reaction causing immediate rash, facial/tongue/throat swelling, SOB or lightheadedness with hypotension: No Has patient had a PCN reaction causing severe rash involving mucus membranes or skin necrosis: No Has patient had a PCN reaction that required hospitalization: No Has patient had a PCN reaction occurring within the last 10 years: No If all of the above answers are "NO", then may proceed with Cephalosporin use.     Current Outpatient  Medications  Medication Sig Dispense Refill  . diphenoxylate-atropine (LOMOTIL) 2.5-0.025 MG tablet Take 2 tablets by mouth 4 (four) times daily as needed for diarrhea or loose stools. 60 tablet 0  . docusate sodium (COLACE) 100 MG capsule Take 2 capsules (200 mg total) by mouth 2 (two) times daily. 30 capsule 0  . fentaNYL (DURAGESIC - DOSED MCG/HR) 25 MCG/HR patch Place 1 patch (25 mcg  total) onto the skin every 3 (three) days. 10 patch 0  . loperamide (IMODIUM) 2 MG capsule Take by mouth as needed for diarrhea or loose stools.    Marland Kitchen losartan (COZAAR) 50 MG tablet Take 1 tablet (50 mg total) by mouth daily. 30 tablet 0  . ondansetron (ZOFRAN ODT) 4 MG disintegrating tablet Take 1 tablet (4 mg total) by mouth every 8 (eight) hours as needed for nausea or vomiting. 20 tablet 0  . Oxycodone HCl 10 MG TABS Take 1 tablet (10 mg total) by mouth every 6 (six) hours as needed. 60 tablet 0  . promethazine (PHENERGAN) 12.5 MG tablet Take 1 tablet (12.5 mg total) by mouth 3 (three) times daily before meals. 30 tablet 0   No current facility-administered medications for this visit.    Facility-Administered Medications Ordered in Other Visits  Medication Dose Route Frequency Provider Last Rate Last Dose  . prochlorperazine (COMPAZINE) injection 10 mg  10 mg Intravenous Once Rexene Agent        OBJECTIVE: Vitals:   12/18/17 1121 12/18/17 1128  BP:  (!) 143/73  Pulse:  76  Resp: 16   Temp:  (!) 97.4 F (36.3 C)  SpO2:  94%     Body mass index is 27.86 kg/m.    ECOG FS:0 - Asymptomatic  General: Well-developed, well-nourished, no acute distress. Eyes: Pink conjunctiva, anicteric sclera. HEENT: Normocephalic, moist mucous membranes. Lungs: Clear to auscultation bilaterally. Heart: Regular rate and rhythm. No rubs, murmurs, or gallops. Abdomen: Soft, nontender, nondistended. No organomegaly noted, normoactive bowel sounds. Musculoskeletal: No edema, cyanosis, or clubbing. Neuro: Alert, answering all questions appropriately. Cranial nerves grossly intact. Skin: No rashes or petechiae noted. Psych: Normal affect.  LAB RESULTS:  Lab Results  Component Value Date   NA 140 12/18/2017   K 3.4 (L) 12/18/2017   CL 112 (H) 12/18/2017   CO2 22 12/18/2017   GLUCOSE 115 (H) 12/18/2017   BUN 14 12/18/2017   CREATININE 1.94 (H) 12/18/2017   CALCIUM 8.8 (L) 12/18/2017   PROT 6.8  12/18/2017   ALBUMIN 3.0 (L) 12/18/2017   AST 31 12/18/2017   ALT 28 12/18/2017   ALKPHOS 99 12/18/2017   BILITOT 0.5 12/18/2017   GFRNONAA 25 (L) 12/18/2017   GFRAA 29 (L) 12/18/2017    Lab Results  Component Value Date   WBC 15.0 (H) 12/18/2017   NEUTROABS 8.0 (H) 12/18/2017   HGB 9.3 (L) 12/18/2017   HCT 28.1 (L) 12/18/2017   MCV 90.4 12/18/2017   PLT 244 12/18/2017     STUDIES: Ct Abdomen Pelvis W Contrast  Result Date: 12/13/2017 CLINICAL DATA:  Pancreatic cancer diagnosed in August. Currently on chemotherapy. EXAM: CT ABDOMEN AND PELVIS WITH CONTRAST TECHNIQUE: Multidetector CT imaging of the abdomen and pelvis was performed using the standard protocol following bolus administration of intravenous contrast. CONTRAST:  160mL ISOVUE-300 IOPAMIDOL (ISOVUE-300) INJECTION 61% COMPARISON:  10/17/2017 CT.  10/30/2017 PET. FINDINGS: Lower chest: Clear lung bases. Heart size upper normal, accentuated by a mild pectus excavatum deformity. Hepatobiliary: Suspicion of mild hepatic steatosis, without focal  liver lesion. There is focal steatosis adjacent the falciform ligament. Cholecystectomy, without biliary ductal dilatation. Pancreas: Again identified is a mass within the pancreatic uncinate process. This is more cystic and better defined today. Measures on the order of 2.7 x 2.5 cm versus 3.0 x 2.8 cm on the prior. Persistent contact with greater than 180 degrees of the SMA, including on image 24/2. No evidence of acute pancreatitis or duct dilatation. Spleen: Normal in size, without focal abnormality. Adrenals/Urinary Tract: Normal adrenal glands. Normal right kidney. Partially exophytic interpolar left renal mass of 3.0 cm on image 25/2. Compare 2.8 cm on the prior. No hydronephrosis. Normal urinary bladder. Again identified is a right-sided urethral diverticulum, including image 73/2. This is unchanged. Stomach/Bowel: Normal stomach, without wall thickening. Colonic diverticulosis. Suspicion  of mild pericolonic edema involving the descending/sigmoid junction, including on image 55/2. Normal terminal ileum and appendix. Normal small bowel. Vascular/Lymphatic: Aortic atherosclerosis. The celiac is uninvolved. There is interstitial thickening along the superior mesenteric artery origin on image 22/2, new. Tumor abuts the more distal SMA as detailed above. Patent hepatic portal veins.  No abdominopelvic adenopathy. Reproductive: Normal uterus and adnexa. Other: No significant free fluid. No evidence of omental or peritoneal disease. A fat containing ventral abdominal wall hernia is again identified. Musculoskeletal: Lumbosacral spondylosis with advanced degenerative disc disease at L4-5 and L5-S1. IMPRESSION: 1. Decreased size of uncinate process pancreatic mass. Suspicion of superior mesenteric artery involvement, as detailed above. 2. No evidence of distant metastasis. 3. Enhancing left renal mass, similar to slightly increased. This is most consistent with renal cell carcinoma. 4.  Aortic Atherosclerosis (ICD10-I70.0). 5. Right-sided urethral diverticulum. 6. Colonic diverticulosis. Suspicion of mild non complicated diverticulitis involving the distal descending colon at its junction with the sigmoid. Electronically Signed   By: Abigail Miyamoto M.D.   On: 12/13/2017 10:40    ASSESSMENT: Clinical stage Ib pancreatic adenocarcinoma.  PLAN:    1.  Clinical stage Ib pancreatic adenocarcinoma: EUS completed on October 25, 2017 confirmed adenocarcinoma. Patient CA-19-9 is mildly elevated at 78.  PET scan results reviewed independently confirming no other disease outside the pancreas.  After review at cancer conference and with radiology, it appears the lesion is abutting the SMA, therefore patient will require neoadjuvant chemotherapy.  She was evaluated by surgery and is considered borderline resectable.  Patient may also require stereotactic XRT prior to surgery.  Repeat CT scan on December 13, 2017  reviewed independently and report as above with improvement of pancreatic mass, but still abutting the SMA.  Neoadjuvant FOLFIRINOX was too toxic for patient, therefore we will switch to gemcitabine and Abraxane.  Will hold Abraxane for first cycle and consider adding cycle 2.  Patient received treatment on days 1, 8, and 15 with a 22 off.  Return to clinic in 1 week for further evaluation and consideration of cycle 1, day 1 which will be gemcitabine only. 3.  Pain: Improved.  Continue fentanyl patch to 50 mcg every 3 hours.  Continue oxycodone as prescribed.   4.  Renal mass: CT scan results as above.  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    No intervention is needed at this time.  Continue to monitor while patient is undergoing treatment for her pancreatic cancer.  Patient was previously given a referral to urology.   4.  Genetic: Genetic testing is pending at time of dictation. 5.  Anemia: Hemoglobin is decreased, but stable at 9.3.  Monitor. 6.  Chronic renal insufficiency: Patient's creatinine continues to  trend up and is now 1.94.  Proceed with 1 L IV fluids today.  Patient expressed understanding and was in agreement with this plan. She also understands that She can call clinic at any time with any questions, concerns, or complaints.   Cancer Staging Pancreatic adenocarcinoma [facility]) Staging form: Exocrine Pancreas, AJCC 8th Edition - Clinical stage from 10/26/2017: Stage IB (cT2, cN0, cM0) - Signed by Lloyd Huger, MD on 10/26/2017   Lloyd Huger, MD   12/20/2017 6:56 AM

## 2017-12-18 ENCOUNTER — Inpatient Hospital Stay: Payer: Medicare Other

## 2017-12-18 ENCOUNTER — Other Ambulatory Visit: Payer: Self-pay

## 2017-12-18 ENCOUNTER — Inpatient Hospital Stay (HOSPITAL_BASED_OUTPATIENT_CLINIC_OR_DEPARTMENT_OTHER): Payer: Medicare Other | Admitting: Oncology

## 2017-12-18 ENCOUNTER — Encounter: Payer: Self-pay | Admitting: Oncology

## 2017-12-18 VITALS — BP 143/73 | HR 76 | Temp 97.4°F | Resp 16 | Ht 63.0 in | Wt 157.3 lb

## 2017-12-18 DIAGNOSIS — D649 Anemia, unspecified: Secondary | ICD-10-CM

## 2017-12-18 DIAGNOSIS — C259 Malignant neoplasm of pancreas, unspecified: Secondary | ICD-10-CM

## 2017-12-18 DIAGNOSIS — R630 Anorexia: Secondary | ICD-10-CM

## 2017-12-18 DIAGNOSIS — N2889 Other specified disorders of kidney and ureter: Secondary | ICD-10-CM

## 2017-12-18 DIAGNOSIS — N189 Chronic kidney disease, unspecified: Secondary | ICD-10-CM | POA: Diagnosis not present

## 2017-12-18 DIAGNOSIS — R5383 Other fatigue: Secondary | ICD-10-CM

## 2017-12-18 DIAGNOSIS — Z5111 Encounter for antineoplastic chemotherapy: Secondary | ICD-10-CM | POA: Diagnosis not present

## 2017-12-18 DIAGNOSIS — E860 Dehydration: Secondary | ICD-10-CM

## 2017-12-18 DIAGNOSIS — R531 Weakness: Secondary | ICD-10-CM

## 2017-12-18 DIAGNOSIS — R52 Pain, unspecified: Secondary | ICD-10-CM

## 2017-12-18 DIAGNOSIS — R970 Elevated carcinoembryonic antigen [CEA]: Secondary | ICD-10-CM

## 2017-12-18 LAB — COMPREHENSIVE METABOLIC PANEL
ALK PHOS: 99 U/L (ref 38–126)
ALT: 28 U/L (ref 0–44)
ANION GAP: 6 (ref 5–15)
AST: 31 U/L (ref 15–41)
Albumin: 3 g/dL — ABNORMAL LOW (ref 3.5–5.0)
BUN: 14 mg/dL (ref 8–23)
CALCIUM: 8.8 mg/dL — AB (ref 8.9–10.3)
CO2: 22 mmol/L (ref 22–32)
Chloride: 112 mmol/L — ABNORMAL HIGH (ref 98–111)
Creatinine, Ser: 1.94 mg/dL — ABNORMAL HIGH (ref 0.44–1.00)
GFR calc non Af Amer: 25 mL/min — ABNORMAL LOW (ref >60)
GFR, EST AFRICAN AMERICAN: 29 mL/min — AB (ref >60)
Glucose, Bld: 115 mg/dL — ABNORMAL HIGH (ref 70–99)
Potassium: 3.4 mmol/L — ABNORMAL LOW (ref 3.5–5.1)
SODIUM: 140 mmol/L (ref 135–145)
TOTAL PROTEIN: 6.8 g/dL (ref 6.5–8.1)
Total Bilirubin: 0.5 mg/dL (ref 0.3–1.2)

## 2017-12-18 LAB — CBC WITH DIFFERENTIAL/PLATELET
Abs Immature Granulocytes: 0.68 10*3/uL — ABNORMAL HIGH (ref 0.00–0.07)
BASOS PCT: 0 %
Basophils Absolute: 0.1 10*3/uL (ref 0.0–0.1)
EOS ABS: 0.2 10*3/uL (ref 0.0–0.5)
Eosinophils Relative: 1 %
HCT: 28.1 % — ABNORMAL LOW (ref 36.0–46.0)
Hemoglobin: 9.3 g/dL — ABNORMAL LOW (ref 12.0–15.0)
IMMATURE GRANULOCYTES: 5 %
Lymphocytes Relative: 31 %
Lymphs Abs: 4.6 10*3/uL — ABNORMAL HIGH (ref 0.7–4.0)
MCH: 29.9 pg (ref 26.0–34.0)
MCHC: 33.1 g/dL (ref 30.0–36.0)
MCV: 90.4 fL (ref 80.0–100.0)
Monocytes Absolute: 1.5 10*3/uL — ABNORMAL HIGH (ref 0.1–1.0)
Monocytes Relative: 10 %
NEUTROS PCT: 53 %
Neutro Abs: 8 10*3/uL — ABNORMAL HIGH (ref 1.7–7.7)
PLATELETS: 244 10*3/uL (ref 150–400)
RBC: 3.11 MIL/uL — AB (ref 3.87–5.11)
RDW: 15.7 % — AB (ref 11.5–15.5)
WBC: 15 10*3/uL — ABNORMAL HIGH (ref 4.0–10.5)
nRBC: 0.1 % (ref 0.0–0.2)

## 2017-12-18 MED ORDER — SODIUM CHLORIDE 0.9 % IV SOLN
Freq: Once | INTRAVENOUS | Status: AC
  Administered 2017-12-18: 12:00:00 via INTRAVENOUS
  Filled 2017-12-18: qty 250

## 2017-12-18 MED ORDER — SODIUM CHLORIDE 0.9% FLUSH
10.0000 mL | Freq: Once | INTRAVENOUS | Status: AC
  Administered 2017-12-18: 10 mL via INTRAVENOUS
  Filled 2017-12-18: qty 10

## 2017-12-18 MED ORDER — HEPARIN SOD (PORK) LOCK FLUSH 100 UNIT/ML IV SOLN
500.0000 [IU] | Freq: Once | INTRAVENOUS | Status: AC
  Administered 2017-12-18: 500 [IU] via INTRAVENOUS
  Filled 2017-12-18: qty 5

## 2017-12-18 NOTE — Progress Notes (Signed)
Patient here for follow up. She reports that her appetite is 25% of normal. She is feeling weak and lethargic. Her stools are now semi formed.

## 2017-12-20 NOTE — Progress Notes (Signed)
DISCONTINUE ON PATHWAY REGIMEN - Pancreatic     A cycle is every 14 days:     Oxaliplatin      Leucovorin      Irinotecan      5-Fluorouracil      5-Fluorouracil   **Always confirm dose/schedule in your pharmacy ordering system**  REASON: Toxicities / Adverse Event PRIOR TREATMENT: PANOS62: FOLFIRINOX q14 Days x 4 Cycles TREATMENT RESPONSE: Partial Response (PR)  START ON PATHWAY REGIMEN - Pancreatic     A cycle is every 28 days:     Nab-paclitaxel (protein bound)      Gemcitabine   **Always confirm dose/schedule in your pharmacy ordering system**  Patient Characteristics: Adenocarcinoma, Borderline Resectable or High Risk, Potentially Resectable, Primary Neoadjuvant Therapy, PS = 0, 1 Histology: Adenocarcinoma Current evidence of distant metastases<= No AJCC T Category: T2 AJCC N Category: N0 AJCC M Category: M0 AJCC 8 Stage Grouping: IB Intent of Therapy: Curative Intent, Discussed with Patient

## 2017-12-20 NOTE — Progress Notes (Signed)
Nutrition Follow-up:  Patient with pancreatic cancer stage I.  Patient receiving chemotherapy. Recent diagnosis with c-diff.    Called patient for nutrition follow-up this pm per patient request.  Patient reports appetite is picking up.  Reports eating small bagel with cream cheese and juice for breakfast and soup and maybe 1/2 sandwich for lunch and supper.  Reports that she is drinking ensure shakes about 1 time per day.    Reports diarrhea is improving, more formed stool.  Nausea is better.   Medications: reviewed  Labs: K 3.4, creatinine 1.94  Anthropometrics:   Weight 157 lb 4.8 oz noted on 10/29 decreased from 161 lb on 10/9.    NUTRITION DIAGNOSIS: Malnutrition continues   MALNUTRITION DIAGNOSIS: Malnutrition continues   INTERVENTION:  Discussed strategies to increase calories and protein now that nausea and diarrhea are improved.  Encouraged continued use of supplement drinks for added calories.   Patient has declined face to face follow up as treatment days not on day when RD is available.  Declined phone follow-up as well and patient will contact RD if needed.     MONITORING, EVALUATION, GOAL: weight trends, intake   NEXT VISIT: patient to contact as needed  Lavonn Maxcy B. Zenia Resides, [place], [employer] Registered Dietitian [phone] (pager)

## 2017-12-23 NOTE — Progress Notes (Signed)
[place]  [phone] [phone]  ID: Cindy Bowman OB: 1950-07-22  MR#: [id number]  KGM#:[id number]  Patient Care Team: Glendon Axe, MD as PCP - General (Internal Medicine) Clent Jacks, RN as Registered Nurse Lloyd Huger, MD as Medical Oncologist (Medical Oncology)  CHIEF COMPLAINT: Clinical stage Ib pancreatic adenocarcinoma.  INTERVAL HISTORY: Patient returns to clinic today for repeat laboratory work, further evaluation, and consideration of cycle 1, day 1 of gemcitabine only.  She continues to have weakness and fatigue, but this has improved over the past week.  She continues to have a poor appetite but states this is also improving.  Her pain is better controlled on her current narcotic regimen.  She has no neurologic complaints.  She denies any fevers.  She denies any chest pain or shortness of breath.  She has no nausea, vomiting, constipation, or diarrhea.  She has no urinary complaints.  Patient offers no further specific complaints today.  REVIEW OF SYSTEMS:   Review of Systems  Constitutional: Positive for malaise/fatigue. Negative for fever and weight loss.  Respiratory: Negative.  Negative for cough and shortness of breath.   Cardiovascular: Negative.  Negative for chest pain and leg swelling.  Gastrointestinal: Negative.  Negative for abdominal pain, blood in stool, melena, nausea and vomiting.  Genitourinary: Negative.  Negative for dysuria.  Musculoskeletal: Negative.  Negative for back pain.  Skin: Negative.  Negative for rash.  Neurological: Positive for weakness. Negative for dizziness, focal weakness and headaches.  Psychiatric/Behavioral: Negative.  The patient is not nervous/anxious.     As per HPI. Otherwise, a complete review of systems is negative.  PAST MEDICAL HISTORY: Past Medical History:  Diagnosis Date  . Essential hypertension   . Pancreatic cancer ([place])   . UTI (urinary tract infection)      PAST SURGICAL HISTORY: Past Surgical History:  Procedure Laterality Date  . CESAREAN SECTION    . CHOLECYSTECTOMY    . EUS N/A 10/25/2017   Procedure: FULL UPPER ENDOSCOPIC ULTRASOUND (EUS) RADIAL;  Surgeon: Jola Schmidt, MD;  Location: [facility] ENDOSCOPY;  Service: Endoscopy;  Laterality: N/A;  . OOPHORECTOMY    . PORTA CATH INSERTION N/A 11/12/2017   Procedure: PORTA CATH INSERTION;  Surgeon: Algernon Huxley, MD;  Location: [place] [facility];  Service: Cardiovascular;  Laterality: N/A;  . TUBAL LIGATION      FAMILY HISTORY: Family History  Problem Relation Age of Onset  . Hypertension Mother        deceased 53  . Heart attack Father        deceased late 70s    ADVANCED DIRECTIVES (Y/N):  N  HEALTH MAINTENANCE: Social History   Tobacco Use  . Smoking status: Never Smoker  . Smokeless tobacco: Never Used  Substance Use Topics  . Alcohol use: Not Currently  . Drug use: Never     Colonoscopy:  PAP:  Bone density:  Lipid panel:  Allergies  Allergen Reactions  . Nsaids Other (See Comments)    Decreased GFR  . Ibuprofen     Lowers kidney function  . Penicillins     Yeast infection  Has patient had a PCN reaction causing immediate rash, facial/tongue/throat swelling, SOB or lightheadedness with hypotension: No Has patient had a PCN reaction causing severe rash involving mucus membranes or skin necrosis: No Has patient had a PCN reaction that required hospitalization: No Has patient had a PCN reaction occurring within the last 10 years: No If all of the  above answers are "NO", then may proceed with Cephalosporin use.     Current Outpatient Medications  Medication Sig Dispense Refill  . fentaNYL (DURAGESIC - DOSED MCG/HR) 25 MCG/HR patch Place 1 patch (25 mcg total) onto the skin every 3 (three) days. 10 patch 0  . diphenoxylate-atropine (LOMOTIL) 2.5-0.025 MG tablet Take 2 tablets by mouth 4 (four) times daily as needed for diarrhea or loose stools. (Patient not  taking: Reported on 12/26/2017) 60 tablet 0  . docusate sodium (COLACE) 100 MG capsule Take 2 capsules (200 mg total) by mouth 2 (two) times daily. (Patient not taking: Reported on 12/26/2017) 30 capsule 0  . loperamide (IMODIUM) 2 MG capsule Take by mouth as needed for diarrhea or loose stools.    Marland Kitchen losartan (COZAAR) 50 MG tablet Take 1 tablet (50 mg total) by mouth daily. 30 tablet 0  . ondansetron (ZOFRAN ODT) 4 MG disintegrating tablet Take 1 tablet (4 mg total) by mouth every 8 (eight) hours as needed for nausea or vomiting. (Patient not taking: Reported on 12/26/2017) 20 tablet 0  . Oxycodone HCl 10 MG TABS Take 1 tablet (10 mg total) by mouth every 6 (six) hours as needed. (Patient not taking: Reported on 12/26/2017) 60 tablet 0  . promethazine (PHENERGAN) 12.5 MG tablet Take 1 tablet (12.5 mg total) by mouth 3 (three) times daily before meals. (Patient not taking: Reported on 12/26/2017) 30 tablet 0   No current facility-administered medications for this visit.    Facility-Administered Medications Ordered in Other Visits  Medication Dose Route Frequency Provider Last Rate Last Dose  . prochlorperazine (COMPAZINE) injection 10 mg  10 mg Intravenous Once Rexene Agent        OBJECTIVE: Vitals:   12/26/17 0922  BP: (!) 150/74  Pulse: 98  Resp: 18  Temp: (!) 96.6 F (35.9 C)     Body mass index is 27.69 kg/m.    ECOG FS:0 - Asymptomatic  General: Well-developed, well-nourished, no acute distress.  Sitting in a wheelchair. Eyes: Pink conjunctiva, anicteric sclera. HEENT: Normocephalic, moist mucous membranes. Lungs: Clear to auscultation bilaterally. Heart: Regular rate and rhythm. No rubs, murmurs, or gallops. Abdomen: Soft, nontender, nondistended. No organomegaly noted, normoactive bowel sounds. Musculoskeletal: No edema, cyanosis, or clubbing. Neuro: Alert, answering all questions appropriately. Cranial nerves grossly intact. Skin: No rashes or petechiae noted. Psych: Normal  affect.  LAB RESULTS:  Lab Results  Component Value Date   NA 141 12/26/2017   K 3.7 12/26/2017   CL 108 12/26/2017   CO2 22 12/26/2017   GLUCOSE 161 (H) 12/26/2017   BUN 10 12/26/2017   CREATININE 1.46 (H) 12/26/2017   CALCIUM 8.8 (L) 12/26/2017   PROT 7.0 12/26/2017   ALBUMIN 2.9 (L) 12/26/2017   AST 34 12/26/2017   ALT 16 12/26/2017   ALKPHOS 98 12/26/2017   BILITOT 0.5 12/26/2017   GFRNONAA 36 (L) 12/26/2017   GFRAA 41 (L) 12/26/2017    Lab Results  Component Value Date   WBC 13.3 (H) 12/26/2017   NEUTROABS 7.3 12/26/2017   HGB 8.8 (L) 12/26/2017   HCT 26.9 (L) 12/26/2017   MCV 91.8 12/26/2017   PLT 372 12/26/2017     STUDIES: Ct Abdomen Pelvis W Contrast  Result Date: 12/13/2017 CLINICAL DATA:  Pancreatic cancer diagnosed in August. Currently on chemotherapy. EXAM: CT ABDOMEN AND PELVIS WITH CONTRAST TECHNIQUE: Multidetector CT imaging of the abdomen and pelvis was performed using the standard protocol following bolus administration of intravenous contrast. CONTRAST:  138mL ISOVUE-300 IOPAMIDOL (ISOVUE-300) INJECTION 61% COMPARISON:  10/17/2017 CT.  10/30/2017 PET. FINDINGS: Lower chest: Clear lung bases. Heart size upper normal, accentuated by a mild pectus excavatum deformity. Hepatobiliary: Suspicion of mild hepatic steatosis, without focal liver lesion. There is focal steatosis adjacent the falciform ligament. Cholecystectomy, without biliary ductal dilatation. Pancreas: Again identified is a mass within the pancreatic uncinate process. This is more cystic and better defined today. Measures on the order of 2.7 x 2.5 cm versus 3.0 x 2.8 cm on the prior. Persistent contact with greater than 180 degrees of the SMA, including on image 24/2. No evidence of acute pancreatitis or duct dilatation. Spleen: Normal in size, without focal abnormality. Adrenals/Urinary Tract: Normal adrenal glands. Normal right kidney. Partially exophytic interpolar left renal mass of 3.0 cm on  image 25/2. Compare 2.8 cm on the prior. No hydronephrosis. Normal urinary bladder. Again identified is a right-sided urethral diverticulum, including image 73/2. This is unchanged. Stomach/Bowel: Normal stomach, without wall thickening. Colonic diverticulosis. Suspicion of mild pericolonic edema involving the descending/sigmoid junction, including on image 55/2. Normal terminal ileum and appendix. Normal small bowel. Vascular/Lymphatic: Aortic atherosclerosis. The celiac is uninvolved. There is interstitial thickening along the superior mesenteric artery origin on image 22/2, new. Tumor abuts the more distal SMA as detailed above. Patent hepatic portal veins.  No abdominopelvic adenopathy. Reproductive: Normal uterus and adnexa. Other: No significant free fluid. No evidence of omental or peritoneal disease. A fat containing ventral abdominal wall hernia is again identified. Musculoskeletal: Lumbosacral spondylosis with advanced degenerative disc disease at L4-5 and L5-S1. IMPRESSION: 1. Decreased size of uncinate process pancreatic mass. Suspicion of superior mesenteric artery involvement, as detailed above. 2. No evidence of distant metastasis. 3. Enhancing left renal mass, similar to slightly increased. This is most consistent with renal cell carcinoma. 4.  Aortic Atherosclerosis (ICD10-I70.0). 5. Right-sided urethral diverticulum. 6. Colonic diverticulosis. Suspicion of mild non complicated diverticulitis involving the distal descending colon at its junction with the sigmoid. Electronically Signed   By: Abigail Miyamoto M.D.   On: 12/13/2017 10:40    ASSESSMENT: Clinical stage Ib pancreatic adenocarcinoma.  PLAN:    1.  Clinical stage Ib pancreatic adenocarcinoma: EUS completed on October 25, 2017 confirmed adenocarcinoma. Patient CA-19-9 is mildly elevated at 78.  PET scan results reviewed independently confirming no other disease outside the pancreas.  After review at cancer conference and with radiology,  it appears the lesion is abutting the SMA, therefore patient will require neoadjuvant chemotherapy.  She was evaluated by surgery and is considered borderline resectable.  Patient may also require stereotactic XRT prior to surgery.  Repeat CT scan on December 13, 2017 reviewed independently  with improvement of pancreatic mass, but still abutting the SMA.  Neoadjuvant FOLFIRINOX was too toxic for patient, therefore she was switched to single agent gemcitabine.  Will consider adding Abraxane if her performance status improves. Patient received treatment on days 1, 8, and 15 with a 22 off.  Proceed with cycle 1, day 1 of gemcitabine only today.  Return to clinic in 1 week for further evaluation and consideration of cycle 1, day 8.   3.  Pain: Patient not complain of this today. Continue fentanyl patch to 50 mcg every 3 hours.  Continue oxycodone as prescribed.   4.  Renal mass: CT scan results as above.  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    No intervention is needed at this time.  Continue to monitor while patient is undergoing treatment for her pancreatic  cancer.  Patient was previously given a referral to urology.   4.  Genetic: Genetic testing is pending at time of dictation. 5.  Anemia: Hemoglobin has trended down slightly to 8.8.  Monitor.   6.  Chronic renal insufficiency: Patient's creatinine is slowly improving and is now 1.46.  Monitor.  Next 7.  Leukocytosis: Improving, likely reactive.  Monitor.  Patient expressed understanding and was in agreement with this plan. She also understands that She can call clinic at any time with any questions, concerns, or complaints.   Cancer Staging Pancreatic adenocarcinoma [facility]) Staging form: Exocrine Pancreas, AJCC 8th Edition - Clinical stage from 10/26/2017: Stage IB (cT2, cN0, cM0) - Signed by Lloyd Huger, MD on 10/26/2017   Lloyd Huger, MD   12/26/2017 1:03 PM

## 2017-12-26 ENCOUNTER — Inpatient Hospital Stay: Payer: Medicare Other | Admitting: Hospice and Palliative Medicine

## 2017-12-26 ENCOUNTER — Inpatient Hospital Stay (HOSPITAL_BASED_OUTPATIENT_CLINIC_OR_DEPARTMENT_OTHER): Payer: Medicare Other | Admitting: Oncology

## 2017-12-26 ENCOUNTER — Other Ambulatory Visit: Payer: Self-pay

## 2017-12-26 ENCOUNTER — Inpatient Hospital Stay: Payer: Medicare Other | Attending: Oncology

## 2017-12-26 ENCOUNTER — Inpatient Hospital Stay: Payer: Medicare Other

## 2017-12-26 VITALS — BP 150/74 | HR 98 | Temp 96.6°F | Resp 18 | Wt 156.3 lb

## 2017-12-26 DIAGNOSIS — Z79899 Other long term (current) drug therapy: Secondary | ICD-10-CM | POA: Diagnosis not present

## 2017-12-26 DIAGNOSIS — C259 Malignant neoplasm of pancreas, unspecified: Secondary | ICD-10-CM

## 2017-12-26 DIAGNOSIS — N189 Chronic kidney disease, unspecified: Secondary | ICD-10-CM

## 2017-12-26 DIAGNOSIS — Z515 Encounter for palliative care: Secondary | ICD-10-CM

## 2017-12-26 DIAGNOSIS — R630 Anorexia: Secondary | ICD-10-CM

## 2017-12-26 DIAGNOSIS — R531 Weakness: Secondary | ICD-10-CM

## 2017-12-26 DIAGNOSIS — R5383 Other fatigue: Secondary | ICD-10-CM

## 2017-12-26 DIAGNOSIS — Z5111 Encounter for antineoplastic chemotherapy: Secondary | ICD-10-CM | POA: Diagnosis not present

## 2017-12-26 DIAGNOSIS — D649 Anemia, unspecified: Secondary | ICD-10-CM | POA: Insufficient documentation

## 2017-12-26 DIAGNOSIS — N2889 Other specified disorders of kidney and ureter: Secondary | ICD-10-CM

## 2017-12-26 LAB — CBC WITH DIFFERENTIAL/PLATELET
Abs Immature Granulocytes: 0.16 10*3/uL — ABNORMAL HIGH (ref 0.00–0.07)
Basophils Absolute: 0.1 10*3/uL (ref 0.0–0.1)
Basophils Relative: 1 %
Eosinophils Absolute: 0 10*3/uL (ref 0.0–0.5)
Eosinophils Relative: 0 %
HEMATOCRIT: 26.9 % — AB (ref 36.0–46.0)
Hemoglobin: 8.8 g/dL — ABNORMAL LOW (ref 12.0–15.0)
IMMATURE GRANULOCYTES: 1 %
LYMPHS ABS: 4.2 10*3/uL — AB (ref 0.7–4.0)
Lymphocytes Relative: 31 %
MCH: 30 pg (ref 26.0–34.0)
MCHC: 32.7 g/dL (ref 30.0–36.0)
MCV: 91.8 fL (ref 80.0–100.0)
MONOS PCT: 11 %
Monocytes Absolute: 1.5 10*3/uL — ABNORMAL HIGH (ref 0.1–1.0)
NEUTROS ABS: 7.3 10*3/uL (ref 1.7–7.7)
NEUTROS PCT: 56 %
PLATELETS: 372 10*3/uL (ref 150–400)
RBC: 2.93 MIL/uL — ABNORMAL LOW (ref 3.87–5.11)
RDW: 16.4 % — ABNORMAL HIGH (ref 11.5–15.5)
WBC: 13.3 10*3/uL — ABNORMAL HIGH (ref 4.0–10.5)
nRBC: 0.2 % (ref 0.0–0.2)

## 2017-12-26 LAB — COMPREHENSIVE METABOLIC PANEL
ALT: 16 U/L (ref 0–44)
AST: 34 U/L (ref 15–41)
Albumin: 2.9 g/dL — ABNORMAL LOW (ref 3.5–5.0)
Alkaline Phosphatase: 98 U/L (ref 38–126)
Anion gap: 11 (ref 5–15)
BILIRUBIN TOTAL: 0.5 mg/dL (ref 0.3–1.2)
BUN: 10 mg/dL (ref 8–23)
CO2: 22 mmol/L (ref 22–32)
Calcium: 8.8 mg/dL — ABNORMAL LOW (ref 8.9–10.3)
Chloride: 108 mmol/L (ref 98–111)
Creatinine, Ser: 1.46 mg/dL — ABNORMAL HIGH (ref 0.44–1.00)
GFR, EST AFRICAN AMERICAN: 41 mL/min — AB (ref >60)
GFR, EST NON AFRICAN AMERICAN: 36 mL/min — AB (ref >60)
Glucose, Bld: 161 mg/dL — ABNORMAL HIGH (ref 70–99)
POTASSIUM: 3.7 mmol/L (ref 3.5–5.1)
Sodium: 141 mmol/L (ref 135–145)
TOTAL PROTEIN: 7 g/dL (ref 6.5–8.1)

## 2017-12-26 MED ORDER — HEPARIN SOD (PORK) LOCK FLUSH 100 UNIT/ML IV SOLN
500.0000 [IU] | Freq: Once | INTRAVENOUS | Status: AC | PRN
  Administered 2017-12-26: 500 [IU]
  Filled 2017-12-26: qty 5

## 2017-12-26 MED ORDER — SODIUM CHLORIDE 0.9 % IV SOLN
Freq: Once | INTRAVENOUS | Status: AC
  Administered 2017-12-26: 10:00:00 via INTRAVENOUS
  Filled 2017-12-26: qty 250

## 2017-12-26 MED ORDER — PROCHLORPERAZINE MALEATE 10 MG PO TABS
10.0000 mg | ORAL_TABLET | Freq: Once | ORAL | Status: AC
  Administered 2017-12-26: 10 mg via ORAL
  Filled 2017-12-26: qty 1

## 2017-12-26 MED ORDER — SODIUM CHLORIDE 0.9 % IV SOLN
1800.0000 mg | Freq: Once | INTRAVENOUS | Status: AC
  Administered 2017-12-26: 1800 mg via INTRAVENOUS
  Filled 2017-12-26: qty 47

## 2017-12-26 NOTE — Progress Notes (Signed)
[place]  [phone] [phone]) [phone]   Name: Cindy Bowman Date: 12/26/2017 MRN: [id number]  DOB: 01/26/1951  Patient Care Team: Glendon Axe, MD as PCP - General (Internal Medicine) Clent Jacks, RN as Registered Nurse Lloyd Huger, MD as Medical Oncologist (Medical Oncology)    REASON FOR CONSULTATION: Palliative Care consult requested for this 67 y.o. female with multiple medical problems including stage Ib adenocarcinoma of the pancreas (diagnosed 09/2017) currently receiving neoadjuvant FOLFIRINOX plan for possible stereotactic XRT prior to resection.  Patient was recently hospitalized 11/16/2017 to 11/18/2017 with intractable nausea and vomiting secondary to chemotherapy, which improved with aggressive use of antiemetics.  Patient has been referred to palliative care to help with symptom management and medical goals.  SOCIAL HISTORY:    Patient is divorced.  She has one son with whom she lives.  Patient moved here from [place].  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  ADVANCE DIRECTIVES:  Patient says her son is her healthcare power of attorney.  Patient says she has a living will.  CODE STATUS: Full code  PAST MEDICAL HISTORY: Past Medical History:  Diagnosis Date  . Essential hypertension   . Pancreatic cancer ([place])   . UTI (urinary tract infection)     PAST SURGICAL HISTORY:  Past Surgical History:  Procedure Laterality Date  . CESAREAN SECTION    . CHOLECYSTECTOMY    . EUS N/A 10/25/2017   Procedure: FULL UPPER ENDOSCOPIC ULTRASOUND (EUS) RADIAL;  Surgeon: Jola Schmidt, MD;  Location: [facility] ENDOSCOPY;  Service: Endoscopy;  Laterality: N/A;  . OOPHORECTOMY    . PORTA CATH INSERTION N/A 11/12/2017   Procedure: PORTA CATH INSERTION;  Surgeon: Algernon Huxley, MD;  Location: [place] [facility];  Service: Cardiovascular;  Laterality: N/A;  . TUBAL LIGATION      HEMATOLOGY/ONCOLOGY HISTORY:      Pancreatic adenocarcinoma ([place])   10/18/2017 Initial Diagnosis    Pancreatic adenocarcinoma ([place])    10/26/2017 Cancer Staging    Staging form: Exocrine Pancreas, AJCC 8th Edition - Clinical stage from 10/26/2017: Stage IB (cT2, cN0, cM0) - Signed by Lloyd Huger, MD on 10/26/2017    11/06/2017 - 12/11/2017 Chemotherapy    The patient had palonosetron (ALOXI) injection 0.25 mg, 0.25 mg, Intravenous,  Once, 2 of 4 cycles Administration: 0.25 mg (11/14/2017), 0.25 mg (11/28/2017) irinotecan (CAMPTOSAR) 340 mg in dextrose 5 % 500 mL chemo infusion, 180 mg/m2 = 340 mg, Intravenous,  Once, 2 of 4 cycles Dose modification: 160 mg/m2 (original dose 180 mg/m2, Cycle 2, Reason: Dose not tolerated) Administration: 340 mg (11/14/2017), 300 mg (11/28/2017) leucovorin 750 mg in dextrose 5 % 250 mL infusion, 760 mg, Intravenous,  Once, 2 of 4 cycles Dose modification: 360 mg/m2 (original dose 400 mg/m2, Cycle 2, Reason: Dose not tolerated) Administration: 750 mg (11/14/2017), 700 mg (11/28/2017) oxaliplatin (ELOXATIN) 150 mg in dextrose 5 % 500 mL chemo infusion, 160 mg, Intravenous,  Once, 2 of 4 cycles Dose modification: 70 mg/m2 (original dose 85 mg/m2, Cycle 2, Reason: Dose not tolerated) Administration: 150 mg (11/14/2017), 135 mg (11/28/2017) fluorouracil (ADRUCIL) chemo injection 750 mg, 400 mg/m2 = 750 mg, Intravenous,  Once, 2 of 4 cycles Dose modification: 360 mg/m2 (original dose 400 mg/m2, Cycle 2, Reason: Dose not tolerated) Administration: 750 mg (11/14/2017), 700 mg (11/28/2017) fluorouracil (ADRUCIL) 4,550 mg in sodium chloride 0.9 % 59 mL chemo infusion, 2,400 mg/m2 = 4,550 mg, Intravenous, 1 Day/Dose, 2 of 4 cycles  Dose modification: 2,000 mg/m2 (original dose 2,400 mg/m2, Cycle 2, Reason: Dose not tolerated) Administration: 4,550 mg (11/14/2017), 3,800 mg (11/28/2017)  for chemotherapy treatment.     12/20/2017 -  Chemotherapy    The patient had gemcitabine (GEMZAR) 1,800 mg in sodium chloride  0.9 % 250 mL chemo infusion, 1,786 mg, Intravenous,  Once, 1 of 4 cycles Administration: 1,800 mg (12/26/2017) PACLitaxel-protein bound (ABRAXANE) chemo infusion 225 mg, 125 mg/m2 = 225 mg, Intravenous, Once, 0 of 3 cycles  for chemotherapy treatment.      ALLERGIES:  is allergic to nsaids; ibuprofen; and penicillins.  MEDICATIONS:  Current Outpatient Medications  Medication Sig Dispense Refill  . diphenoxylate-atropine (LOMOTIL) 2.5-0.025 MG tablet Take 2 tablets by mouth 4 (four) times daily as needed for diarrhea or loose stools. (Patient not taking: Reported on 12/26/2017) 60 tablet 0  . docusate sodium (COLACE) 100 MG capsule Take 2 capsules (200 mg total) by mouth 2 (two) times daily. (Patient not taking: Reported on 12/26/2017) 30 capsule 0  . fentaNYL (DURAGESIC - DOSED MCG/HR) 25 MCG/HR patch Place 1 patch (25 mcg total) onto the skin every 3 (three) days. 10 patch 0  . loperamide (IMODIUM) 2 MG capsule Take by mouth as needed for diarrhea or loose stools.    Marland Kitchen losartan (COZAAR) 50 MG tablet Take 1 tablet (50 mg total) by mouth daily. 30 tablet 0  . ondansetron (ZOFRAN ODT) 4 MG disintegrating tablet Take 1 tablet (4 mg total) by mouth every 8 (eight) hours as needed for nausea or vomiting. (Patient not taking: Reported on 12/26/2017) 20 tablet 0  . Oxycodone HCl 10 MG TABS Take 1 tablet (10 mg total) by mouth every 6 (six) hours as needed. (Patient not taking: Reported on 12/26/2017) 60 tablet 0  . promethazine (PHENERGAN) 12.5 MG tablet Take 1 tablet (12.5 mg total) by mouth 3 (three) times daily before meals. (Patient not taking: Reported on 12/26/2017) 30 tablet 0   No current facility-administered medications for this visit.    Facility-Administered Medications Ordered in Other Visits  Medication Dose Route Frequency Provider Last Rate Last Dose  . prochlorperazine (COMPAZINE) injection 10 mg  10 mg Intravenous Once Rexene Agent        VITAL SIGNS: There were no vitals taken for  this visit. There were no vitals filed for this visit.  Estimated body mass index is 27.69 kg/m as calculated from the following:   Height as of 12/18/17: 5\' 3"  (1.6 m).   Weight as of an earlier encounter on 12/26/17: 156 lb 4.8 oz (70.9 kg).  LABS: CBC:    Component Value Date/Time   WBC 13.3 (H) 12/26/2017 0850   HGB 8.8 (L) 12/26/2017 0850   HCT 26.9 (L) 12/26/2017 0850   PLT 372 12/26/2017 0850   MCV 91.8 12/26/2017 0850   NEUTROABS 7.3 12/26/2017 0850   LYMPHSABS 4.2 (H) 12/26/2017 0850   MONOABS 1.5 (H) 12/26/2017 0850   EOSABS 0.0 12/26/2017 0850   BASOSABS 0.1 12/26/2017 0850   Comprehensive Metabolic Panel:    Component Value Date/Time   NA 141 12/26/2017 0850   K 3.7 12/26/2017 0850   CL 108 12/26/2017 0850   CO2 22 12/26/2017 0850   BUN 10 12/26/2017 0850   CREATININE 1.46 (H) 12/26/2017 0850   GLUCOSE 161 (H) 12/26/2017 0850   CALCIUM 8.8 (L) 12/26/2017 0850   AST 34 12/26/2017 0850   ALT 16 12/26/2017 0850   ALKPHOS 98 12/26/2017 0850   BILITOT 0.5  12/26/2017 0850   PROT 7.0 12/26/2017 0850   ALBUMIN 2.9 (L) 12/26/2017 0850    RADIOGRAPHIC STUDIES: Ct Abdomen Pelvis W Contrast  Result Date: 12/13/2017 CLINICAL DATA:  Pancreatic cancer diagnosed in August. Currently on chemotherapy. EXAM: CT ABDOMEN AND PELVIS WITH CONTRAST TECHNIQUE: Multidetector CT imaging of the abdomen and pelvis was performed using the standard protocol following bolus administration of intravenous contrast. CONTRAST:  178mL ISOVUE-300 IOPAMIDOL (ISOVUE-300) INJECTION 61% COMPARISON:  10/17/2017 CT.  10/30/2017 PET. FINDINGS: Lower chest: Clear lung bases. Heart size upper normal, accentuated by a mild pectus excavatum deformity. Hepatobiliary: Suspicion of mild hepatic steatosis, without focal liver lesion. There is focal steatosis adjacent the falciform ligament. Cholecystectomy, without biliary ductal dilatation. Pancreas: Again identified is a mass within the pancreatic uncinate  process. This is more cystic and better defined today. Measures on the order of 2.7 x 2.5 cm versus 3.0 x 2.8 cm on the prior. Persistent contact with greater than 180 degrees of the SMA, including on image 24/2. No evidence of acute pancreatitis or duct dilatation. Spleen: Normal in size, without focal abnormality. Adrenals/Urinary Tract: Normal adrenal glands. Normal right kidney. Partially exophytic interpolar left renal mass of 3.0 cm on image 25/2. Compare 2.8 cm on the prior. No hydronephrosis. Normal urinary bladder. Again identified is a right-sided urethral diverticulum, including image 73/2. This is unchanged. Stomach/Bowel: Normal stomach, without wall thickening. Colonic diverticulosis. Suspicion of mild pericolonic edema involving the descending/sigmoid junction, including on image 55/2. Normal terminal ileum and appendix. Normal small bowel. Vascular/Lymphatic: Aortic atherosclerosis. The celiac is uninvolved. There is interstitial thickening along the superior mesenteric artery origin on image 22/2, new. Tumor abuts the more distal SMA as detailed above. Patent hepatic portal veins.  No abdominopelvic adenopathy. Reproductive: Normal uterus and adnexa. Other: No significant free fluid. No evidence of omental or peritoneal disease. A fat containing ventral abdominal wall hernia is again identified. Musculoskeletal: Lumbosacral spondylosis with advanced degenerative disc disease at L4-5 and L5-S1. IMPRESSION: 1. Decreased size of uncinate process pancreatic mass. Suspicion of superior mesenteric artery involvement, as detailed above. 2. No evidence of distant metastasis. 3. Enhancing left renal mass, similar to slightly increased. This is most consistent with renal cell carcinoma. 4.  Aortic Atherosclerosis (ICD10-I70.0). 5. Right-sided urethral diverticulum. 6. Colonic diverticulosis. Suspicion of mild non complicated diverticulitis involving the distal descending colon at its junction with the  sigmoid. Electronically Signed   By: Abigail Miyamoto M.D.   On: 12/13/2017 10:40    PERFORMANCE STATUS (ECOG) : 1 - Symptomatic but completely ambulatory  Review of Systems As noted above. Otherwise, a complete review of systems is negative.  Physical Exam General: NAD, sitting in chair Cardiovascular: regular rate and rhythm Pulmonary: clear ant fields Abdomen: soft, nontender, + bowel sounds GU: no suprapubic tenderness Extremities: no edema, no joint deformities Skin: no rashes Neurological: Weakness but otherwise nonfocal  IMPRESSION: Patient seen in the infusion suite.  Patient reports doing much better symptomatically.  She denies pain, nausea and vomiting.  Patient reports improved oral intake and diarrhea.  She is using oral supplements once daily but plans to increase this to twice daily.  We discussed patient's support system and emotional coping with her illness.  She has a friend with breast cancer, whom she is relying on as a confidant through this journey.  We talked about the support groups available in the cancer center.  Patient has no acute concerns identified during today's visit.  PLAN: 1.  Continue supportive care 2.  RTC in 1 month or sooner if needed   Patient expressed understanding and was in agreement with this plan. She also understands that She can call clinic at any time with any questions, concerns, or complaints.    Time Total: 15 minutes  Visit consisted of counseling and education dealing with the complex and emotionally intense issues of symptom management and palliative care in the setting of serious and potentially life-threatening illness.Greater than 50%  of this time was spent counseling and coordinating care related to the above assessment and plan.  Signed by: Altha Harm, [place], NP-C, [place] (Work Cell)

## 2017-12-26 NOTE — Progress Notes (Signed)
Here for follow up per pt feeling weak-in w/c. Energy low. After she eats has 2 " mushy BM" having 2-3 BM per day. Stated appetite down but better for her. Pain in mid back -level 3  Progressive she stated.

## 2017-12-26 NOTE — Progress Notes (Signed)
Pt tolerated infusion well. Pt stable at discharge. 

## 2017-12-30 NOTE — Progress Notes (Signed)
[place]  [phone] [phone]) [phone]  ID: Cindy Bowman OB: Jul 04, 1950  MR#: [id number]  HQI#:[id number]  Patient Care Team: Glendon Axe, MD as PCP - General (Internal Medicine) Clent Jacks, RN as Registered Nurse Lloyd Huger, MD as Medical Oncologist (Medical Oncology)  CHIEF COMPLAINT: Clinical stage Ib pancreatic adenocarcinoma.  INTERVAL HISTORY: Patient returns to clinic today for repeat laboratory work, further evaluation, consideration of cycle 1, day 8 of gemcitabine only.  She tolerated her first infusion well without significant side effects.  She continues to have chronic weakness and fatigue but this is slowly improving.  She has improved appetite.  She does not complain of pain today. She has no neurologic complaints.  She denies any fevers.  She denies any chest pain or shortness of breath.  She has no nausea, vomiting, constipation, or diarrhea.  She has no urinary complaints.  Patient offers no further specific complaints today.  REVIEW OF SYSTEMS:   Review of Systems  Constitutional: Positive for malaise/fatigue. Negative for fever and weight loss.  Respiratory: Negative.  Negative for cough and shortness of breath.   Cardiovascular: Negative.  Negative for chest pain and leg swelling.  Gastrointestinal: Negative.  Negative for abdominal pain, blood in stool, melena, nausea and vomiting.  Genitourinary: Negative.  Negative for dysuria.  Musculoskeletal: Negative.  Negative for back pain.  Skin: Negative.  Negative for rash.  Neurological: Positive for weakness. Negative for dizziness, focal weakness and headaches.  Psychiatric/Behavioral: Negative.  The patient is not nervous/anxious.     As per HPI. Otherwise, a complete review of systems is negative.  PAST MEDICAL HISTORY: Past Medical History:  Diagnosis Date  . Essential hypertension   . Pancreatic cancer ([place])   . UTI (urinary tract infection)     PAST  SURGICAL HISTORY: Past Surgical History:  Procedure Laterality Date  . CESAREAN SECTION    . CHOLECYSTECTOMY    . EUS N/A 10/25/2017   Procedure: FULL UPPER ENDOSCOPIC ULTRASOUND (EUS) RADIAL;  Surgeon: Jola Schmidt, MD;  Location: [facility] ENDOSCOPY;  Service: Endoscopy;  Laterality: N/A;  . OOPHORECTOMY    . PORTA CATH INSERTION N/A 11/12/2017   Procedure: PORTA CATH INSERTION;  Surgeon: Algernon Huxley, MD;  Location: [place] [facility];  Service: Cardiovascular;  Laterality: N/A;  . TUBAL LIGATION      FAMILY HISTORY: Family History  Problem Relation Age of Onset  . Hypertension Mother        deceased 74  . Heart attack Father        deceased late 95s    ADVANCED DIRECTIVES (Y/N):  N  HEALTH MAINTENANCE: Social History   Tobacco Use  . Smoking status: Never Smoker  . Smokeless tobacco: Never Used  Substance Use Topics  . Alcohol use: Not Currently  . Drug use: Never     Colonoscopy:  PAP:  Bone density:  Lipid panel:  Allergies  Allergen Reactions  . Nsaids Other (See Comments)    Decreased GFR  . Ibuprofen     Lowers kidney function  . Penicillins     Yeast infection  Has patient had a PCN reaction causing immediate rash, facial/tongue/throat swelling, SOB or lightheadedness with hypotension: No Has patient had a PCN reaction causing severe rash involving mucus membranes or skin necrosis: No Has patient had a PCN reaction that required hospitalization: No Has patient had a PCN reaction occurring within the last 10 years: No If all of the above answers are "NO", then  may proceed with Cephalosporin use.     Current Outpatient Medications  Medication Sig Dispense Refill  . fentaNYL (DURAGESIC - DOSED MCG/HR) 25 MCG/HR patch Place 1 patch (25 mcg total) onto the skin every 3 (three) days. 10 patch 0  . losartan (COZAAR) 50 MG tablet Take 1 tablet (50 mg total) by mouth daily. 30 tablet 0  . ondansetron (ZOFRAN ODT) 4 MG disintegrating tablet Take 1 tablet (4 mg  total) by mouth every 8 (eight) hours as needed for nausea or vomiting. 20 tablet 0  . Oxycodone HCl 10 MG TABS Take 1 tablet (10 mg total) by mouth every 6 (six) hours as needed. 60 tablet 0  . diphenoxylate-atropine (LOMOTIL) 2.5-0.025 MG tablet Take 2 tablets by mouth 4 (four) times daily as needed for diarrhea or loose stools. (Patient not taking: Reported on 12/26/2017) 60 tablet 0  . docusate sodium (COLACE) 100 MG capsule Take 2 capsules (200 mg total) by mouth 2 (two) times daily. (Patient not taking: Reported on 12/26/2017) 30 capsule 0  . loperamide (IMODIUM) 2 MG capsule Take by mouth as needed for diarrhea or loose stools.    . promethazine (PHENERGAN) 12.5 MG tablet Take 1 tablet (12.5 mg total) by mouth 3 (three) times daily before meals. (Patient not taking: Reported on 12/26/2017) 30 tablet 0   No current facility-administered medications for this visit.    Facility-Administered Medications Ordered in Other Visits  Medication Dose Route Frequency Provider Last Rate Last Dose  . prochlorperazine (COMPAZINE) injection 10 mg  10 mg Intravenous Once Rexene Agent        OBJECTIVE: Vitals:   01/02/18 1052  BP: (!) 147/80  Pulse: 85  Resp: 18  Temp: (!) 97.5 F (36.4 C)     Body mass index is 27.55 kg/m.    ECOG FS:0 - Asymptomatic  General: Well-developed, well-nourished, no acute distress. Eyes: Pink conjunctiva, anicteric sclera. HEENT: Normocephalic, moist mucous membranes. Lungs: Clear to auscultation bilaterally. Heart: Regular rate and rhythm. No rubs, murmurs, or gallops. Abdomen: Soft, nontender, nondistended. No organomegaly noted, normoactive bowel sounds. Musculoskeletal: No edema, cyanosis, or clubbing. Neuro: Alert, answering all questions appropriately. Cranial nerves grossly intact. Skin: No rashes or petechiae noted. Psych: Normal affect.  LAB RESULTS:  Lab Results  Component Value Date   NA 138 01/02/2018   K 3.1 (L) 01/02/2018   CL 106 01/02/2018    CO2 23 01/02/2018   GLUCOSE 132 (H) 01/02/2018   BUN 8 01/02/2018   CREATININE 1.28 (H) 01/02/2018   CALCIUM 9.0 01/02/2018   PROT 7.3 01/02/2018   ALBUMIN 2.9 (L) 01/02/2018   AST 33 01/02/2018   ALT 21 01/02/2018   ALKPHOS 90 01/02/2018   BILITOT 0.4 01/02/2018   GFRNONAA 42 (L) 01/02/2018   GFRAA 49 (L) 01/02/2018    Lab Results  Component Value Date   WBC 9.0 01/02/2018   NEUTROABS 3.7 01/02/2018   HGB 8.5 (L) 01/02/2018   HCT 25.7 (L) 01/02/2018   MCV 91.1 01/02/2018   PLT 198 01/02/2018     STUDIES: Ct Abdomen Pelvis W Contrast  Result Date: 12/13/2017 CLINICAL DATA:  Pancreatic cancer diagnosed in August. Currently on chemotherapy. EXAM: CT ABDOMEN AND PELVIS WITH CONTRAST TECHNIQUE: Multidetector CT imaging of the abdomen and pelvis was performed using the standard protocol following bolus administration of intravenous contrast. CONTRAST:  164mL ISOVUE-300 IOPAMIDOL (ISOVUE-300) INJECTION 61% COMPARISON:  10/17/2017 CT.  10/30/2017 PET. FINDINGS: Lower chest: Clear lung bases. Heart size upper normal,  accentuated by a mild pectus excavatum deformity. Hepatobiliary: Suspicion of mild hepatic steatosis, without focal liver lesion. There is focal steatosis adjacent the falciform ligament. Cholecystectomy, without biliary ductal dilatation. Pancreas: Again identified is a mass within the pancreatic uncinate process. This is more cystic and better defined today. Measures on the order of 2.7 x 2.5 cm versus 3.0 x 2.8 cm on the prior. Persistent contact with greater than 180 degrees of the SMA, including on image 24/2. No evidence of acute pancreatitis or duct dilatation. Spleen: Normal in size, without focal abnormality. Adrenals/Urinary Tract: Normal adrenal glands. Normal right kidney. Partially exophytic interpolar left renal mass of 3.0 cm on image 25/2. Compare 2.8 cm on the prior. No hydronephrosis. Normal urinary bladder. Again identified is a right-sided urethral diverticulum,  including image 73/2. This is unchanged. Stomach/Bowel: Normal stomach, without wall thickening. Colonic diverticulosis. Suspicion of mild pericolonic edema involving the descending/sigmoid junction, including on image 55/2. Normal terminal ileum and appendix. Normal small bowel. Vascular/Lymphatic: Aortic atherosclerosis. The celiac is uninvolved. There is interstitial thickening along the superior mesenteric artery origin on image 22/2, new. Tumor abuts the more distal SMA as detailed above. Patent hepatic portal veins.  No abdominopelvic adenopathy. Reproductive: Normal uterus and adnexa. Other: No significant free fluid. No evidence of omental or peritoneal disease. A fat containing ventral abdominal wall hernia is again identified. Musculoskeletal: Lumbosacral spondylosis with advanced degenerative disc disease at L4-5 and L5-S1. IMPRESSION: 1. Decreased size of uncinate process pancreatic mass. Suspicion of superior mesenteric artery involvement, as detailed above. 2. No evidence of distant metastasis. 3. Enhancing left renal mass, similar to slightly increased. This is most consistent with renal cell carcinoma. 4.  Aortic Atherosclerosis (ICD10-I70.0). 5. Right-sided urethral diverticulum. 6. Colonic diverticulosis. Suspicion of mild non complicated diverticulitis involving the distal descending colon at its junction with the sigmoid. Electronically Signed   By: Abigail Miyamoto M.D.   On: 12/13/2017 10:40    ASSESSMENT: Clinical stage Ib pancreatic adenocarcinoma.  PLAN:    1.  Clinical stage Ib pancreatic adenocarcinoma: EUS completed on October 25, 2017 confirmed adenocarcinoma. Patient CA-19-9 is mildly elevated at 78.  PET scan results reviewed independently confirming no other disease outside the pancreas.  After review at cancer conference and with radiology, it appears the lesion is abutting the SMA, therefore patient will require neoadjuvant chemotherapy.  She was evaluated by surgery and is  considered borderline resectable.  Patient may also require stereotactic XRT prior to surgery.  Repeat CT scan on December 13, 2017 reviewed independently  with improvement of pancreatic mass, but still abutting the SMA.  Neoadjuvant FOLFIRINOX was too toxic for patient, therefore she was switched to single agent gemcitabine.  Will consider adding Abraxane if her performance status improves. Patient received treatment on days 1, 8, and 15 with a 22 off.  Proceed with cycle 1, day 8 of gemcitabine only today.  Return to clinic in 1 week for further evaluation and consideration of cycle 1, day 15.    3.  Pain: Patient not complain of this today. Continue fentanyl patch to 50 mcg every 3 hours.  Continue oxycodone as prescribed.   4.  Renal mass: CT scan results as above.  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    No intervention is needed at this time.  Continue to monitor while patient is undergoing treatment for her pancreatic cancer.  Patient was previously given a referral to urology.   4.  Genetic: Genetic testing is pending at time of  dictation. 5.  Anemia: Hemoglobin continues to trend down slowly and is now 8.5.  Monitor.  Hemoglobin has trended down slightly to 8.8.  Monitor.   6.  Chronic renal insufficiency: Improving.  Creatinine is 1.28 today. 7.  Leukocytosis: Resolved.  Patient expressed understanding and was in agreement with this plan. She also understands that She can call clinic at any time with any questions, concerns, or complaints.   Cancer Staging Pancreatic adenocarcinoma [facility]) Staging form: Exocrine Pancreas, AJCC 8th Edition - Clinical stage from 10/26/2017: Stage IB (cT2, cN0, cM0) - Signed by Lloyd Huger, MD on 10/26/2017   Lloyd Huger, MD   01/04/2018 8:47 AM

## 2018-01-02 ENCOUNTER — Inpatient Hospital Stay: Payer: Medicare Other

## 2018-01-02 ENCOUNTER — Encounter: Payer: Self-pay | Admitting: Oncology

## 2018-01-02 ENCOUNTER — Inpatient Hospital Stay (HOSPITAL_BASED_OUTPATIENT_CLINIC_OR_DEPARTMENT_OTHER): Payer: Medicare Other | Admitting: Oncology

## 2018-01-02 VITALS — BP 147/80 | HR 85 | Temp 97.5°F | Resp 18 | Wt 155.5 lb

## 2018-01-02 DIAGNOSIS — C259 Malignant neoplasm of pancreas, unspecified: Secondary | ICD-10-CM | POA: Diagnosis not present

## 2018-01-02 DIAGNOSIS — N2889 Other specified disorders of kidney and ureter: Secondary | ICD-10-CM

## 2018-01-02 DIAGNOSIS — D649 Anemia, unspecified: Secondary | ICD-10-CM

## 2018-01-02 DIAGNOSIS — N189 Chronic kidney disease, unspecified: Secondary | ICD-10-CM

## 2018-01-02 DIAGNOSIS — R5382 Chronic fatigue, unspecified: Secondary | ICD-10-CM

## 2018-01-02 DIAGNOSIS — Z5111 Encounter for antineoplastic chemotherapy: Secondary | ICD-10-CM | POA: Diagnosis not present

## 2018-01-02 DIAGNOSIS — R531 Weakness: Secondary | ICD-10-CM

## 2018-01-02 LAB — CBC WITH DIFFERENTIAL/PLATELET
Abs Immature Granulocytes: 0.38 10*3/uL — ABNORMAL HIGH (ref 0.00–0.07)
Basophils Absolute: 0 10*3/uL (ref 0.0–0.1)
Basophils Relative: 0 %
EOS PCT: 1 %
Eosinophils Absolute: 0.1 10*3/uL (ref 0.0–0.5)
HCT: 25.7 % — ABNORMAL LOW (ref 36.0–46.0)
HEMOGLOBIN: 8.5 g/dL — AB (ref 12.0–15.0)
Immature Granulocytes: 4 %
LYMPHS PCT: 46 %
Lymphs Abs: 4.1 10*3/uL — ABNORMAL HIGH (ref 0.7–4.0)
MCH: 30.1 pg (ref 26.0–34.0)
MCHC: 33.1 g/dL (ref 30.0–36.0)
MCV: 91.1 fL (ref 80.0–100.0)
MONO ABS: 0.7 10*3/uL (ref 0.1–1.0)
Monocytes Relative: 8 %
Neutro Abs: 3.7 10*3/uL (ref 1.7–7.7)
Neutrophils Relative %: 41 %
Platelets: 198 10*3/uL (ref 150–400)
RBC: 2.82 MIL/uL — AB (ref 3.87–5.11)
RDW: 16.4 % — ABNORMAL HIGH (ref 11.5–15.5)
WBC: 9 10*3/uL (ref 4.0–10.5)
nRBC: 0.4 % — ABNORMAL HIGH (ref 0.0–0.2)

## 2018-01-02 LAB — COMPREHENSIVE METABOLIC PANEL
ALK PHOS: 90 U/L (ref 38–126)
ALT: 21 U/L (ref 0–44)
AST: 33 U/L (ref 15–41)
Albumin: 2.9 g/dL — ABNORMAL LOW (ref 3.5–5.0)
Anion gap: 9 (ref 5–15)
BILIRUBIN TOTAL: 0.4 mg/dL (ref 0.3–1.2)
BUN: 8 mg/dL (ref 8–23)
CALCIUM: 9 mg/dL (ref 8.9–10.3)
CHLORIDE: 106 mmol/L (ref 98–111)
CO2: 23 mmol/L (ref 22–32)
CREATININE: 1.28 mg/dL — AB (ref 0.44–1.00)
GFR calc Af Amer: 49 mL/min — ABNORMAL LOW (ref >60)
GFR, EST NON AFRICAN AMERICAN: 42 mL/min — AB (ref >60)
Glucose, Bld: 132 mg/dL — ABNORMAL HIGH (ref 70–99)
Potassium: 3.1 mmol/L — ABNORMAL LOW (ref 3.5–5.1)
Sodium: 138 mmol/L (ref 135–145)
TOTAL PROTEIN: 7.3 g/dL (ref 6.5–8.1)

## 2018-01-02 MED ORDER — SODIUM CHLORIDE 0.9 % IV SOLN
Freq: Once | INTRAVENOUS | Status: AC
  Administered 2018-01-02: 11:00:00 via INTRAVENOUS
  Filled 2018-01-02: qty 250

## 2018-01-02 MED ORDER — PROCHLORPERAZINE MALEATE 10 MG PO TABS
10.0000 mg | ORAL_TABLET | Freq: Once | ORAL | Status: AC
  Administered 2018-01-02: 10 mg via ORAL
  Filled 2018-01-02: qty 1

## 2018-01-02 MED ORDER — SODIUM CHLORIDE 0.9 % IV SOLN
1800.0000 mg | Freq: Once | INTRAVENOUS | Status: AC
  Administered 2018-01-02: 1800 mg via INTRAVENOUS
  Filled 2018-01-02: qty 26.3

## 2018-01-02 MED ORDER — HEPARIN SOD (PORK) LOCK FLUSH 100 UNIT/ML IV SOLN
500.0000 [IU] | Freq: Once | INTRAVENOUS | Status: AC | PRN
  Administered 2018-01-02: 500 [IU]
  Filled 2018-01-02: qty 5

## 2018-01-02 NOTE — Progress Notes (Signed)
Pt in for follow up and denies any difficulties or concerns.

## 2018-01-04 ENCOUNTER — Other Ambulatory Visit: Payer: Self-pay | Admitting: *Deleted

## 2018-01-04 ENCOUNTER — Telehealth: Payer: Self-pay

## 2018-01-04 DIAGNOSIS — C259 Malignant neoplasm of pancreas, unspecified: Secondary | ICD-10-CM

## 2018-01-04 MED ORDER — POTASSIUM CHLORIDE CRYS ER 20 MEQ PO TBCR: 20.0000 meq | EXTENDED_RELEASE_TABLET | Freq: Every day | ORAL | 1 refills | Status: DC

## 2018-01-04 NOTE — Telephone Encounter (Signed)
Called and notified son, [name], that Potassium 59meq daily was [facility] to [facility] [address]. Instructions for medication given. Oncology Nurse Navigator Documentation  Navigator Location: [facility] (01/04/18 1000)   )Navigator Encounter Type: Telephone (01/04/18 1000) Telephone: Lahoma Crocker Call (01/04/18 1000)                                                  Time Spent with Patient: 15 (01/04/18 1000)

## 2018-01-05 NOTE — Progress Notes (Signed)
[place]  [phone] Fax:(336) [phone]  ID: Cindy Bowman OB: March 11, 1950  MR#: [id number]  GUR#:[id number]  Patient Care Team: Glendon Axe, MD as PCP - General (Internal Medicine) Clent Jacks, RN as Registered Nurse Lloyd Huger, MD as Medical Oncologist (Medical Oncology)  CHIEF COMPLAINT: Clinical stage Ib pancreatic adenocarcinoma.  INTERVAL HISTORY: Patient returns to clinic today for repeat laboratory work, further evaluation, and consideration of cycle 1, day 15 of gemcitabine only.  She continues to have chronic weakness and fatigue, but otherwise is tolerating her treatments well.  She has improved appetite.  She does not complain of pain today. She has no neurologic complaints.  She denies any fevers.  She denies any chest pain or shortness of breath.  She has no nausea, vomiting, constipation, or diarrhea.  She has no urinary complaints.  Patient offers no further specific complaints today.  REVIEW OF SYSTEMS:   Review of Systems  Constitutional: Positive for malaise/fatigue. Negative for fever and weight loss.  Respiratory: Negative.  Negative for cough and shortness of breath.   Cardiovascular: Negative.  Negative for chest pain and leg swelling.  Gastrointestinal: Negative.  Negative for abdominal pain, blood in stool, melena, nausea and vomiting.  Genitourinary: Negative.  Negative for dysuria.  Musculoskeletal: Negative.  Negative for back pain.  Skin: Negative.  Negative for rash.  Neurological: Positive for weakness. Negative for dizziness, focal weakness and headaches.  Psychiatric/Behavioral: Negative.  The patient is not nervous/anxious.     As per HPI. Otherwise, a complete review of systems is negative.  PAST MEDICAL HISTORY: Past Medical History:  Diagnosis Date  . Essential hypertension   . Pancreatic cancer ([place])   . UTI (urinary tract infection)     PAST SURGICAL HISTORY: Past Surgical History:    Procedure Laterality Date  . CESAREAN SECTION    . CHOLECYSTECTOMY    . EUS N/A 10/25/2017   Procedure: FULL UPPER ENDOSCOPIC ULTRASOUND (EUS) RADIAL;  Surgeon: Jola Schmidt, MD;  Location: [facility] ENDOSCOPY;  Service: Endoscopy;  Laterality: N/A;  . OOPHORECTOMY    . PORTA CATH INSERTION N/A 11/12/2017   Procedure: PORTA CATH INSERTION;  Surgeon: Algernon Huxley, MD;  Location: [place] [facility];  Service: Cardiovascular;  Laterality: N/A;  . TUBAL LIGATION      FAMILY HISTORY: Family History  Problem Relation Age of Onset  . Hypertension Mother        deceased 82  . Heart attack Father        deceased late 41s    ADVANCED DIRECTIVES (Y/N):  N  HEALTH MAINTENANCE: Social History   Tobacco Use  . Smoking status: Never Smoker  . Smokeless tobacco: Never Used  Substance Use Topics  . Alcohol use: Not Currently  . Drug use: Never     Colonoscopy:  PAP:  Bone density:  Lipid panel:  Allergies  Allergen Reactions  . Nsaids Other (See Comments)    Decreased GFR  . Ibuprofen     Lowers kidney function  . Penicillins     Yeast infection  Has patient had a PCN reaction causing immediate rash, facial/tongue/throat swelling, SOB or lightheadedness with hypotension: No Has patient had a PCN reaction causing severe rash involving mucus membranes or skin necrosis: No Has patient had a PCN reaction that required hospitalization: No Has patient had a PCN reaction occurring within the last 10 years: No If all of the above answers are "NO", then may proceed with Cephalosporin use.  Current Outpatient Medications  Medication Sig Dispense Refill  . fentaNYL (DURAGESIC - DOSED MCG/HR) 25 MCG/HR patch Place 1 patch (25 mcg total) onto the skin every 3 (three) days. 10 patch 0  . loperamide (IMODIUM) 2 MG capsule Take by mouth as needed for diarrhea or loose stools.    Marland Kitchen losartan (COZAAR) 50 MG tablet Take 1 tablet (50 mg total) by mouth daily. 30 tablet 0  . ondansetron (ZOFRAN  ODT) 4 MG disintegrating tablet Take 1 tablet (4 mg total) by mouth every 8 (eight) hours as needed for nausea or vomiting. 20 tablet 0  . Oxycodone HCl 10 MG TABS Take 1 tablet (10 mg total) by mouth every 6 (six) hours as needed. 60 tablet 0  . potassium chloride SA (K-DUR,KLOR-CON) 20 MEQ tablet Take 1 tablet (20 mEq total) by mouth daily. 30 tablet 1  . diphenoxylate-atropine (LOMOTIL) 2.5-0.025 MG tablet Take 2 tablets by mouth 4 (four) times daily as needed for diarrhea or loose stools. (Patient not taking: Reported on 12/26/2017) 60 tablet 0  . docusate sodium (COLACE) 100 MG capsule Take 2 capsules (200 mg total) by mouth 2 (two) times daily. (Patient not taking: Reported on 12/26/2017) 30 capsule 0  . promethazine (PHENERGAN) 12.5 MG tablet Take 1 tablet (12.5 mg total) by mouth 3 (three) times daily before meals. (Patient not taking: Reported on 01/09/2018) 30 tablet 0   No current facility-administered medications for this visit.    Facility-Administered Medications Ordered in Other Visits  Medication Dose Route Frequency Provider Last Rate Last Dose  . prochlorperazine (COMPAZINE) injection 10 mg  10 mg Intravenous Once Rexene Agent        OBJECTIVE: Vitals:   01/09/18 0927  BP: (!) 145/76  Pulse: 81  Resp: 18  Temp: 97.8 F (36.6 C)     Body mass index is 27.47 kg/m.    ECOG FS:0 - Asymptomatic  General: Well-developed, well-nourished, no acute distress. Eyes: Pink conjunctiva, anicteric sclera. HEENT: Normocephalic, moist mucous membranes. Lungs: Clear to auscultation bilaterally. Heart: Regular rate and rhythm. No rubs, murmurs, or gallops. Abdomen: Soft, nontender, nondistended. No organomegaly noted, normoactive bowel sounds. Musculoskeletal: No edema, cyanosis, or clubbing. Neuro: Alert, answering all questions appropriately. Cranial nerves grossly intact. Skin: No rashes or petechiae noted. Psych: Normal affect.  LAB RESULTS:  Lab Results  Component Value Date     NA 140 01/09/2018   K 3.4 (L) 01/09/2018   CL 109 01/09/2018   CO2 22 01/09/2018   GLUCOSE 121 (H) 01/09/2018   BUN 8 01/09/2018   CREATININE 1.40 (H) 01/09/2018   CALCIUM 8.9 01/09/2018   PROT 7.2 01/09/2018   ALBUMIN 3.1 (L) 01/09/2018   AST 56 (H) 01/09/2018   ALT 39 01/09/2018   ALKPHOS 93 01/09/2018   BILITOT 0.4 01/09/2018   GFRNONAA 38 (L) 01/09/2018   GFRAA 44 (L) 01/09/2018    Lab Results  Component Value Date   WBC 5.9 01/09/2018   NEUTROABS 1.3 (L) 01/09/2018   HGB 8.0 (L) 01/09/2018   HCT 24.4 (L) 01/09/2018   MCV 92.4 01/09/2018   PLT 123 (L) 01/09/2018     STUDIES: Ct Abdomen Pelvis W Contrast  Result Date: 12/13/2017 CLINICAL DATA:  Pancreatic cancer diagnosed in August. Currently on chemotherapy. EXAM: CT ABDOMEN AND PELVIS WITH CONTRAST TECHNIQUE: Multidetector CT imaging of the abdomen and pelvis was performed using the standard protocol following bolus administration of intravenous contrast. CONTRAST:  175mL ISOVUE-300 IOPAMIDOL (ISOVUE-300) INJECTION 61% COMPARISON:  10/17/2017 CT.  10/30/2017 PET. FINDINGS: Lower chest: Clear lung bases. Heart size upper normal, accentuated by a mild pectus excavatum deformity. Hepatobiliary: Suspicion of mild hepatic steatosis, without focal liver lesion. There is focal steatosis adjacent the falciform ligament. Cholecystectomy, without biliary ductal dilatation. Pancreas: Again identified is a mass within the pancreatic uncinate process. This is more cystic and better defined today. Measures on the order of 2.7 x 2.5 cm versus 3.0 x 2.8 cm on the prior. Persistent contact with greater than 180 degrees of the SMA, including on image 24/2. No evidence of acute pancreatitis or duct dilatation. Spleen: Normal in size, without focal abnormality. Adrenals/Urinary Tract: Normal adrenal glands. Normal right kidney. Partially exophytic interpolar left renal mass of 3.0 cm on image 25/2. Compare 2.8 cm on the prior. No hydronephrosis.  Normal urinary bladder. Again identified is a right-sided urethral diverticulum, including image 73/2. This is unchanged. Stomach/Bowel: Normal stomach, without wall thickening. Colonic diverticulosis. Suspicion of mild pericolonic edema involving the descending/sigmoid junction, including on image 55/2. Normal terminal ileum and appendix. Normal small bowel. Vascular/Lymphatic: Aortic atherosclerosis. The celiac is uninvolved. There is interstitial thickening along the superior mesenteric artery origin on image 22/2, new. Tumor abuts the more distal SMA as detailed above. Patent hepatic portal veins.  No abdominopelvic adenopathy. Reproductive: Normal uterus and adnexa. Other: No significant free fluid. No evidence of omental or peritoneal disease. A fat containing ventral abdominal wall hernia is again identified. Musculoskeletal: Lumbosacral spondylosis with advanced degenerative disc disease at L4-5 and L5-S1. IMPRESSION: 1. Decreased size of uncinate process pancreatic mass. Suspicion of superior mesenteric artery involvement, as detailed above. 2. No evidence of distant metastasis. 3. Enhancing left renal mass, similar to slightly increased. This is most consistent with renal cell carcinoma. 4.  Aortic Atherosclerosis (ICD10-I70.0). 5. Right-sided urethral diverticulum. 6. Colonic diverticulosis. Suspicion of mild non complicated diverticulitis involving the distal descending colon at its junction with the sigmoid. Electronically Signed   By: Abigail Miyamoto M.D.   On: 12/13/2017 10:40    ASSESSMENT: Clinical stage Ib pancreatic adenocarcinoma.  PLAN:    1.  Clinical stage Ib pancreatic adenocarcinoma: EUS completed on October 25, 2017 confirmed adenocarcinoma.   Patient's CA 19-9 is essentially unchanged at 81. PET scan results reviewed independently confirming no other disease outside the pancreas.  After review at cancer conference and with radiology, it appears the lesion is abutting the SMA,  therefore patient will require neoadjuvant chemotherapy.  She was evaluated by surgery and is considered borderline resectable.  Patient may also require stereotactic XRT prior to surgery.  Repeat CT scan on December 13, 2017 reviewed independently  with improvement of pancreatic mass, but still abutting the SMA.  Neoadjuvant FOLFIRINOX was too toxic for patient, therefore she was switched to single agent gemcitabine.  Will consider adding Abraxane if her performance status improves. Patient received treatment on days 1, 8, and 15 with a 22 off.  Proceed with cycle 1, day 15 of gemcitabine only today.  Return to clinic in 2 weeks for further evaluation and consideration of cycle 2, day 1.   2.  Anemia: Patient will return to clinic Friday for blood transfusion. 3.  Pain: Patient not complain of this today. Continue fentanyl patch 25 mcg every 3 days.  Continue oxycodone as prescribed.   4.  Renal mass: CT scan results as above.  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    No intervention is needed at this time.  Continue to monitor while patient is  undergoing treatment for her pancreatic cancer.  Patient was previously given a referral to urology.   4.  Genetic: Genetic testing is pending at time of dictation. 5.  Chronic renal insufficiency: Slightly decreased, patient's creatinine is 1.40 today.  Patient expressed understanding and was in agreement with this plan. She also understands that She can call clinic at any time with any questions, concerns, or complaints.   Cancer Staging Pancreatic adenocarcinoma [facility]) Staging form: Exocrine Pancreas, AJCC 8th Edition - Clinical stage from 10/26/2017: Stage IB (cT2, cN0, cM0) - Signed by Lloyd Huger, MD on 10/26/2017   Lloyd Huger, MD   01/11/2018 11:23 AM

## 2018-01-09 ENCOUNTER — Inpatient Hospital Stay (HOSPITAL_BASED_OUTPATIENT_CLINIC_OR_DEPARTMENT_OTHER): Payer: Medicare Other | Admitting: Oncology

## 2018-01-09 ENCOUNTER — Inpatient Hospital Stay: Payer: Medicare Other

## 2018-01-09 VITALS — BP 145/76 | HR 81 | Temp 97.8°F | Resp 18 | Wt 155.1 lb

## 2018-01-09 DIAGNOSIS — N2889 Other specified disorders of kidney and ureter: Secondary | ICD-10-CM | POA: Diagnosis not present

## 2018-01-09 DIAGNOSIS — N189 Chronic kidney disease, unspecified: Secondary | ICD-10-CM

## 2018-01-09 DIAGNOSIS — D649 Anemia, unspecified: Secondary | ICD-10-CM | POA: Diagnosis not present

## 2018-01-09 DIAGNOSIS — R531 Weakness: Secondary | ICD-10-CM

## 2018-01-09 DIAGNOSIS — C259 Malignant neoplasm of pancreas, unspecified: Secondary | ICD-10-CM

## 2018-01-09 DIAGNOSIS — R5382 Chronic fatigue, unspecified: Secondary | ICD-10-CM

## 2018-01-09 DIAGNOSIS — Z5111 Encounter for antineoplastic chemotherapy: Secondary | ICD-10-CM | POA: Diagnosis not present

## 2018-01-09 LAB — CBC WITH DIFFERENTIAL/PLATELET
Abs Immature Granulocytes: 0.28 10*3/uL — ABNORMAL HIGH (ref 0.00–0.07)
BASOS ABS: 0 10*3/uL (ref 0.0–0.1)
Basophils Relative: 1 %
Eosinophils Absolute: 0.1 10*3/uL (ref 0.0–0.5)
Eosinophils Relative: 2 %
HEMATOCRIT: 24.4 % — AB (ref 36.0–46.0)
HEMOGLOBIN: 8 g/dL — AB (ref 12.0–15.0)
IMMATURE GRANULOCYTES: 5 %
LYMPHS ABS: 3.8 10*3/uL (ref 0.7–4.0)
Lymphocytes Relative: 63 %
MCH: 30.3 pg (ref 26.0–34.0)
MCHC: 32.8 g/dL (ref 30.0–36.0)
MCV: 92.4 fL (ref 80.0–100.0)
Monocytes Absolute: 0.4 10*3/uL (ref 0.1–1.0)
Monocytes Relative: 6 %
NEUTROS PCT: 23 %
NRBC: 1.9 % — AB (ref 0.0–0.2)
Neutro Abs: 1.3 10*3/uL — ABNORMAL LOW (ref 1.7–7.7)
Platelets: 123 10*3/uL — ABNORMAL LOW (ref 150–400)
RBC: 2.64 MIL/uL — ABNORMAL LOW (ref 3.87–5.11)
RDW: 17.1 % — ABNORMAL HIGH (ref 11.5–15.5)
WBC: 5.9 10*3/uL (ref 4.0–10.5)

## 2018-01-09 LAB — COMPREHENSIVE METABOLIC PANEL
ALBUMIN: 3.1 g/dL — AB (ref 3.5–5.0)
ALT: 39 U/L (ref 0–44)
ANION GAP: 9 (ref 5–15)
AST: 56 U/L — ABNORMAL HIGH (ref 15–41)
Alkaline Phosphatase: 93 U/L (ref 38–126)
BUN: 8 mg/dL (ref 8–23)
CHLORIDE: 109 mmol/L (ref 98–111)
CO2: 22 mmol/L (ref 22–32)
Calcium: 8.9 mg/dL (ref 8.9–10.3)
Creatinine, Ser: 1.4 mg/dL — ABNORMAL HIGH (ref 0.44–1.00)
GFR calc non Af Amer: 38 mL/min — ABNORMAL LOW (ref >60)
GFR, EST AFRICAN AMERICAN: 44 mL/min — AB (ref >60)
GLUCOSE: 121 mg/dL — AB (ref 70–99)
Potassium: 3.4 mmol/L — ABNORMAL LOW (ref 3.5–5.1)
SODIUM: 140 mmol/L (ref 135–145)
Total Bilirubin: 0.4 mg/dL (ref 0.3–1.2)
Total Protein: 7.2 g/dL (ref 6.5–8.1)

## 2018-01-09 LAB — ABO/RH: ABO/RH(D): O POS

## 2018-01-09 LAB — PREPARE RBC (CROSSMATCH)

## 2018-01-09 MED ORDER — SODIUM CHLORIDE 0.9 % IV SOLN
1800.0000 mg | Freq: Once | INTRAVENOUS | Status: AC
  Administered 2018-01-09: 1800 mg via INTRAVENOUS
  Filled 2018-01-09: qty 26.3

## 2018-01-09 MED ORDER — SODIUM CHLORIDE 0.9% FLUSH
10.0000 mL | Freq: Once | INTRAVENOUS | Status: AC
  Administered 2018-01-09: 10 mL via INTRAVENOUS
  Filled 2018-01-09: qty 10

## 2018-01-09 MED ORDER — SODIUM CHLORIDE 0.9 % IV SOLN
Freq: Once | INTRAVENOUS | Status: AC
  Administered 2018-01-09: 10:00:00 via INTRAVENOUS
  Filled 2018-01-09: qty 250

## 2018-01-09 MED ORDER — PROCHLORPERAZINE MALEATE 10 MG PO TABS
10.0000 mg | ORAL_TABLET | Freq: Once | ORAL | Status: AC
  Administered 2018-01-09: 10 mg via ORAL
  Filled 2018-01-09: qty 1

## 2018-01-09 MED ORDER — HEPARIN SOD (PORK) LOCK FLUSH 100 UNIT/ML IV SOLN
500.0000 [IU] | Freq: Once | INTRAVENOUS | Status: AC
  Administered 2018-01-09: 500 [IU] via INTRAVENOUS
  Filled 2018-01-09: qty 5

## 2018-01-09 NOTE — Addendum Note (Signed)
Addended by: Lorrine Kin A on: 01/09/2018 03:26 PM   Modules accepted: Orders

## 2018-01-09 NOTE — Progress Notes (Signed)
Patient here today for follow up regarding pancreatic cancer. Patient reports feeling sluggish, denies other concerns today.

## 2018-01-10 LAB — CANCER ANTIGEN 19-9: CAN 19-9: 81 U/mL — AB (ref 0–35)

## 2018-01-11 ENCOUNTER — Inpatient Hospital Stay: Payer: Medicare Other

## 2018-01-11 DIAGNOSIS — C259 Malignant neoplasm of pancreas, unspecified: Secondary | ICD-10-CM

## 2018-01-11 DIAGNOSIS — Z5111 Encounter for antineoplastic chemotherapy: Secondary | ICD-10-CM | POA: Diagnosis not present

## 2018-01-11 MED ORDER — SODIUM CHLORIDE 0.9% FLUSH
10.0000 mL | INTRAVENOUS | Status: AC | PRN
  Administered 2018-01-11: 10 mL
  Filled 2018-01-11: qty 10

## 2018-01-11 MED ORDER — HEPARIN SOD (PORK) LOCK FLUSH 100 UNIT/ML IV SOLN
500.0000 [IU] | Freq: Every day | INTRAVENOUS | Status: AC | PRN
  Administered 2018-01-11: 500 [IU]
  Filled 2018-01-11: qty 5

## 2018-01-11 MED ORDER — SODIUM CHLORIDE 0.9% IV SOLUTION
250.0000 mL | Freq: Once | INTRAVENOUS | Status: AC
  Administered 2018-01-11: 250 mL via INTRAVENOUS
  Filled 2018-01-11: qty 250

## 2018-01-11 MED ORDER — ACETAMINOPHEN 325 MG PO TABS
650.0000 mg | ORAL_TABLET | Freq: Once | ORAL | Status: AC
  Administered 2018-01-11: 650 mg via ORAL
  Filled 2018-01-11: qty 2

## 2018-01-11 MED ORDER — DIPHENHYDRAMINE HCL 50 MG/ML IJ SOLN
25.0000 mg | Freq: Once | INTRAMUSCULAR | Status: AC
  Administered 2018-01-11: 25 mg via INTRAVENOUS
  Filled 2018-01-11: qty 1

## 2018-01-12 LAB — TYPE AND SCREEN
ABO/RH(D): O POS
ANTIBODY SCREEN: NEGATIVE
Unit division: 0

## 2018-01-12 LAB — BPAM RBC
Blood Product Expiration Date: 201912172359
ISSUE DATE / TIME: 201911220938
UNIT TYPE AND RH: 5100

## 2018-01-20 NOTE — Progress Notes (Signed)
[place]  [phone] [phone]  ID: Cindy Bowman OB: 12/16/1950  MR#: [id number]  QQI#:[id number]  Patient Care Team: Glendon Axe, MD as PCP - General (Internal Medicine) Clent Jacks, RN as Registered Nurse Lloyd Huger, MD as Medical Oncologist (Medical Oncology)  CHIEF COMPLAINT: Clinical stage Ib pancreatic adenocarcinoma.  INTERVAL HISTORY: Patient returns to clinic today for repeat laboratory work, further evaluation, and consideration of cycle 2, day 1 of gemcitabine only.  She continues to have chronic weakness and fatigue, but otherwise is tolerating her treatments well.  She has improved appetite.  She does not complain of pain today. She has no neurologic complaints.  She denies any fevers.  She denies any chest pain or shortness of breath.  She has no nausea, vomiting, constipation, or diarrhea.  She has no urinary complaints.  Patient offers no further specific complaints today.  REVIEW OF SYSTEMS:   Review of Systems  Constitutional: Positive for malaise/fatigue. Negative for fever and weight loss.  Respiratory: Negative.  Negative for cough and shortness of breath.   Cardiovascular: Negative.  Negative for chest pain and leg swelling.  Gastrointestinal: Negative.  Negative for abdominal pain, blood in stool, melena, nausea and vomiting.  Genitourinary: Negative.  Negative for dysuria.  Musculoskeletal: Negative.  Negative for back pain.  Skin: Negative.  Negative for rash.  Neurological: Positive for weakness. Negative for dizziness, focal weakness and headaches.  Psychiatric/Behavioral: Negative.  The patient is not nervous/anxious.     As per HPI. Otherwise, a complete review of systems is negative.  PAST MEDICAL HISTORY: Past Medical History:  Diagnosis Date  . Essential hypertension   . Pancreatic cancer ([place])   . UTI (urinary tract infection)     PAST SURGICAL HISTORY: Past Surgical History:    Procedure Laterality Date  . CESAREAN SECTION    . CHOLECYSTECTOMY    . EUS N/A 10/25/2017   Procedure: FULL UPPER ENDOSCOPIC ULTRASOUND (EUS) RADIAL;  Surgeon: Jola Schmidt, MD;  Location: [facility] ENDOSCOPY;  Service: Endoscopy;  Laterality: N/A;  . OOPHORECTOMY    . PORTA CATH INSERTION N/A 11/12/2017   Procedure: PORTA CATH INSERTION;  Surgeon: Algernon Huxley, MD;  Location: [place] [facility];  Service: Cardiovascular;  Laterality: N/A;  . TUBAL LIGATION      FAMILY HISTORY: Family History  Problem Relation Age of Onset  . Hypertension Mother        deceased 71  . Heart attack Father        deceased late 64s    ADVANCED DIRECTIVES (Y/N):  N  HEALTH MAINTENANCE: Social History   Tobacco Use  . Smoking status: Never Smoker  . Smokeless tobacco: Never Used  Substance Use Topics  . Alcohol use: Not Currently  . Drug use: Never     Colonoscopy:  PAP:  Bone density:  Lipid panel:  Allergies  Allergen Reactions  . Nsaids Other (See Comments)    Decreased GFR  . Ibuprofen     Lowers kidney function  . Penicillins     Yeast infection  Has patient had a PCN reaction causing immediate rash, facial/tongue/throat swelling, SOB or lightheadedness with hypotension: No Has patient had a PCN reaction causing severe rash involving mucus membranes or skin necrosis: No Has patient had a PCN reaction that required hospitalization: No Has patient had a PCN reaction occurring within the last 10 years: No If all of the above answers are "NO", then may proceed with Cephalosporin use.  Current Outpatient Medications  Medication Sig Dispense Refill  . loperamide (IMODIUM) 2 MG capsule Take by mouth as needed for diarrhea or loose stools.    Marland Kitchen losartan (COZAAR) 50 MG tablet Take 1 tablet (50 mg total) by mouth daily. 30 tablet 0  . ondansetron (ZOFRAN ODT) 4 MG disintegrating tablet Take 1 tablet (4 mg total) by mouth every 8 (eight) hours as needed for nausea or vomiting. 20 tablet  0  . Oxycodone HCl 10 MG TABS Take 1 tablet (10 mg total) by mouth every 6 (six) hours as needed. 60 tablet 0  . promethazine (PHENERGAN) 12.5 MG tablet Take 1 tablet (12.5 mg total) by mouth 3 (three) times daily before meals. 30 tablet 0  . docusate sodium (COLACE) 100 MG capsule Take 2 capsules (200 mg total) by mouth 2 (two) times daily. (Patient not taking: Reported on 12/26/2017) 30 capsule 0  . fentaNYL (DURAGESIC - DOSED MCG/HR) 25 MCG/HR patch Place 1 patch (25 mcg total) onto the skin every 3 (three) days. (Patient not taking: Reported on 01/23/2018) 10 patch 0  . potassium chloride SA (K-DUR,KLOR-CON) 20 MEQ tablet Take 1 tablet (20 mEq total) by mouth daily. (Patient not taking: Reported on 01/23/2018) 30 tablet 1   No current facility-administered medications for this visit.    Facility-Administered Medications Ordered in Other Visits  Medication Dose Route Frequency Provider Last Rate Last Dose  . prochlorperazine (COMPAZINE) injection 10 mg  10 mg Intravenous Once Rexene Agent        OBJECTIVE: Vitals:   01/23/18 1135  BP: (!) 154/81  Pulse: 74  Temp: 98.3 F (36.8 C)     Body mass index is 27.46 kg/m.    ECOG FS:0 - Asymptomatic  General: Well-developed, well-nourished, no acute distress. Eyes: Pink conjunctiva, anicteric sclera. HEENT: Normocephalic, moist mucous membranes. Lungs: Clear to auscultation bilaterally. Heart: Regular rate and rhythm. No rubs, murmurs, or gallops. Abdomen: Soft, nontender, nondistended. No organomegaly noted, normoactive bowel sounds. Musculoskeletal: No edema, cyanosis, or clubbing. Neuro: Alert, answering all questions appropriately. Cranial nerves grossly intact. Skin: No rashes or petechiae noted. Psych: Normal affect.  LAB RESULTS:  Lab Results  Component Value Date   NA 141 01/23/2018   K 3.9 01/23/2018   CL 109 01/23/2018   CO2 24 01/23/2018   GLUCOSE 130 (H) 01/23/2018   BUN 10 01/23/2018   CREATININE 1.23 (H) 01/23/2018     CALCIUM 8.7 (L) 01/23/2018   PROT 6.9 01/23/2018   ALBUMIN 3.1 (L) 01/23/2018   AST 42 (H) 01/23/2018   ALT 35 01/23/2018   ALKPHOS 95 01/23/2018   BILITOT 0.6 01/23/2018   GFRNONAA 45 (L) 01/23/2018   GFRAA 52 (L) 01/23/2018    Lab Results  Component Value Date   WBC 7.4 01/23/2018   NEUTROABS 3.2 01/23/2018   HGB 9.6 (L) 01/23/2018   HCT 28.2 (L) 01/23/2018   MCV 96.2 01/23/2018   PLT 371 01/23/2018     STUDIES: No results found.  ASSESSMENT: Clinical stage Ib pancreatic adenocarcinoma.  PLAN:    1.  Clinical stage Ib pancreatic adenocarcinoma: EUS completed on October 25, 2017 confirmed adenocarcinoma.   Patient's CA 19-9 is essentially unchanged at 81. PET scan results reviewed independently confirming no other disease outside the pancreas.  After review at cancer conference and with radiology, it appears the lesion is abutting the SMA, therefore patient will require neoadjuvant chemotherapy.  She was evaluated by surgery and is considered borderline resectable.  Patient may  also require stereotactic XRT prior to surgery.  Repeat CT scan on December 13, 2017 reviewed independently with improvement of pancreatic mass, but still abutting the SMA.  Neoadjuvant FOLFIRINOX was too toxic for patient, therefore she was switched to single agent gemcitabine.  Will consider adding Abraxane if her performance status improves.  Plan is to give treatment on days 1, 8, and 15 with a 22 off.  Patient still has a mildly decreased performance status, therefore we will proceed with cycle 2, day 1 of gemcitabine only.  Return to clinic in 1 week for further evaluation and consideration of cycle 2, day 8.  Will reimage at the conclusion of cycle 2.   2.  Anemia: Hemoglobin is decreased, but improved since blood transfusion at 9.6. 3.  Pain: Patient not complain of this today. Continue fentanyl patch 25 mcg every 3 days.  Continue oxycodone as prescribed.   4.  Renal mass: CT scan results as above.   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 No intervention is needed at this time.  Continue to monitor while patient is undergoing treatment for her pancreatic cancer.  Patient was previously given a referral to urology.   4.  Genetic: Genetic testing is pending at time of dictation. 5.  Chronic renal insufficiency: Creatinine is slightly improved to 1.23.  Monitor.  Patient expressed understanding and was in agreement with this plan. She also understands that She can call clinic at any time with any questions, concerns, or complaints.   Cancer Staging Pancreatic adenocarcinoma [facility]) Staging form: Exocrine Pancreas, AJCC 8th Edition - Clinical stage from 10/26/2017: Stage IB (cT2, cN0, cM0) - Signed by Lloyd Huger, MD on 10/26/2017   Lloyd Huger, MD   01/26/2018 6:51 AM

## 2018-01-21 ENCOUNTER — Other Ambulatory Visit: Payer: Self-pay | Admitting: Oncology

## 2018-01-23 ENCOUNTER — Inpatient Hospital Stay: Payer: Medicare Other | Attending: Oncology

## 2018-01-23 ENCOUNTER — Inpatient Hospital Stay (HOSPITAL_BASED_OUTPATIENT_CLINIC_OR_DEPARTMENT_OTHER): Payer: Medicare Other | Admitting: Oncology

## 2018-01-23 ENCOUNTER — Encounter: Payer: Medicare Other | Admitting: Hospice and Palliative Medicine

## 2018-01-23 ENCOUNTER — Inpatient Hospital Stay: Payer: Medicare Other

## 2018-01-23 ENCOUNTER — Inpatient Hospital Stay (HOSPITAL_BASED_OUTPATIENT_CLINIC_OR_DEPARTMENT_OTHER): Payer: Medicare Other | Admitting: Hospice and Palliative Medicine

## 2018-01-23 ENCOUNTER — Other Ambulatory Visit: Payer: Self-pay

## 2018-01-23 VITALS — BP 154/81 | HR 74 | Temp 98.3°F | Wt 155.0 lb

## 2018-01-23 DIAGNOSIS — D649 Anemia, unspecified: Secondary | ICD-10-CM

## 2018-01-23 DIAGNOSIS — Z5111 Encounter for antineoplastic chemotherapy: Secondary | ICD-10-CM | POA: Diagnosis not present

## 2018-01-23 DIAGNOSIS — N189 Chronic kidney disease, unspecified: Secondary | ICD-10-CM

## 2018-01-23 DIAGNOSIS — C259 Malignant neoplasm of pancreas, unspecified: Secondary | ICD-10-CM

## 2018-01-23 DIAGNOSIS — R5382 Chronic fatigue, unspecified: Secondary | ICD-10-CM

## 2018-01-23 DIAGNOSIS — Z79899 Other long term (current) drug therapy: Secondary | ICD-10-CM | POA: Insufficient documentation

## 2018-01-23 DIAGNOSIS — E876 Hypokalemia: Secondary | ICD-10-CM | POA: Insufficient documentation

## 2018-01-23 DIAGNOSIS — N2889 Other specified disorders of kidney and ureter: Secondary | ICD-10-CM

## 2018-01-23 DIAGNOSIS — R531 Weakness: Secondary | ICD-10-CM

## 2018-01-23 DIAGNOSIS — Z515 Encounter for palliative care: Secondary | ICD-10-CM

## 2018-01-23 DIAGNOSIS — I824Z2 Acute embolism and thrombosis of unspecified deep veins of left distal lower extremity: Secondary | ICD-10-CM | POA: Insufficient documentation

## 2018-01-23 LAB — CBC WITH DIFFERENTIAL/PLATELET
Abs Immature Granulocytes: 0.04 10*3/uL (ref 0.00–0.07)
Basophils Absolute: 0 10*3/uL (ref 0.0–0.1)
Basophils Relative: 0 %
EOS PCT: 2 %
Eosinophils Absolute: 0.1 10*3/uL (ref 0.0–0.5)
HEMATOCRIT: 28.2 % — AB (ref 36.0–46.0)
HEMOGLOBIN: 9.6 g/dL — AB (ref 12.0–15.0)
Immature Granulocytes: 1 %
LYMPHS PCT: 43 %
Lymphs Abs: 3.2 10*3/uL (ref 0.7–4.0)
MCH: 32.8 pg (ref 26.0–34.0)
MCHC: 34 g/dL (ref 30.0–36.0)
MCV: 96.2 fL (ref 80.0–100.0)
MONO ABS: 0.8 10*3/uL (ref 0.1–1.0)
MONOS PCT: 11 %
Neutro Abs: 3.2 10*3/uL (ref 1.7–7.7)
Neutrophils Relative %: 43 %
Platelets: 371 10*3/uL (ref 150–400)
RBC: 2.93 MIL/uL — ABNORMAL LOW (ref 3.87–5.11)
RDW: 21.1 % — ABNORMAL HIGH (ref 11.5–15.5)
WBC: 7.4 10*3/uL (ref 4.0–10.5)
nRBC: 0 % (ref 0.0–0.2)

## 2018-01-23 LAB — COMPREHENSIVE METABOLIC PANEL
ALK PHOS: 95 U/L (ref 38–126)
ALT: 35 U/L (ref 0–44)
AST: 42 U/L — AB (ref 15–41)
Albumin: 3.1 g/dL — ABNORMAL LOW (ref 3.5–5.0)
Anion gap: 8 (ref 5–15)
BUN: 10 mg/dL (ref 8–23)
CHLORIDE: 109 mmol/L (ref 98–111)
CO2: 24 mmol/L (ref 22–32)
CREATININE: 1.23 mg/dL — AB (ref 0.44–1.00)
Calcium: 8.7 mg/dL — ABNORMAL LOW (ref 8.9–10.3)
GFR calc Af Amer: 52 mL/min — ABNORMAL LOW (ref >60)
GFR calc non Af Amer: 45 mL/min — ABNORMAL LOW (ref >60)
Glucose, Bld: 130 mg/dL — ABNORMAL HIGH (ref 70–99)
Potassium: 3.9 mmol/L (ref 3.5–5.1)
SODIUM: 141 mmol/L (ref 135–145)
Total Bilirubin: 0.6 mg/dL (ref 0.3–1.2)
Total Protein: 6.9 g/dL (ref 6.5–8.1)

## 2018-01-23 MED ORDER — PROCHLORPERAZINE MALEATE 10 MG PO TABS
10.0000 mg | ORAL_TABLET | Freq: Once | ORAL | Status: AC
  Administered 2018-01-23: 10 mg via ORAL
  Filled 2018-01-23: qty 1

## 2018-01-23 MED ORDER — SODIUM CHLORIDE 0.9 % IV SOLN
1800.0000 mg | Freq: Once | INTRAVENOUS | Status: AC
  Administered 2018-01-23: 1800 mg via INTRAVENOUS
  Filled 2018-01-23: qty 21.04

## 2018-01-23 MED ORDER — HEPARIN SOD (PORK) LOCK FLUSH 100 UNIT/ML IV SOLN
500.0000 [IU] | Freq: Once | INTRAVENOUS | Status: AC
  Administered 2018-01-23: 500 [IU] via INTRAVENOUS
  Filled 2018-01-23: qty 5

## 2018-01-23 MED ORDER — SODIUM CHLORIDE 0.9% FLUSH
10.0000 mL | Freq: Once | INTRAVENOUS | Status: AC
  Administered 2018-01-23: 10 mL via INTRAVENOUS
  Filled 2018-01-23: qty 10

## 2018-01-23 MED ORDER — SODIUM CHLORIDE 0.9 % IV SOLN
Freq: Once | INTRAVENOUS | Status: AC
  Administered 2018-01-23: 12:00:00 via INTRAVENOUS
  Filled 2018-01-23: qty 250

## 2018-01-23 MED ORDER — HEPARIN SOD (PORK) LOCK FLUSH 100 UNIT/ML IV SOLN: 500.0000 [IU] | Freq: Once | INTRAVENOUS | Status: DC | PRN

## 2018-01-23 NOTE — Progress Notes (Signed)
Patient is here today for her pancreatic adenocarcinoma. Patient stated that she had been feeling tired and fatigued at times during the AM or on exertion. Patient denied fever, chills, diarrhea, constipation and SOB.

## 2018-01-23 NOTE — Progress Notes (Signed)
[place]  [phone] [phone]) [phone]   Name: Cindy Bowman Date: 01/23/2018 MRN: [id number]  DOB: [birth year]  Patient Care Team: Glendon Axe, MD as PCP - General (Internal Medicine) Clent Jacks, RN as Registered Nurse Grayland Ormond Kathlene November, MD as Medical Oncologist (Medical Oncology)    REASON FOR CONSULTATION: Palliative Care consult requested for this 67 y.o. female with multiple medical problems including stage Ib adenocarcinoma of the pancreas (diagnosed 09/2017) currently receiving neoadjuvant FOLFIRINOX plan for possible stereotactic XRT prior to resection.  Patient was recently hospitalized 11/16/2017 to 11/18/2017 with intractable nausea and vomiting secondary to chemotherapy, which improved with aggressive use of antiemetics.  Patient has been referred to palliative care to help with symptom management and medical goals.  SOCIAL HISTORY:    Patient is divorced.  She has one son with whom she lives.  Patient moved here from [place].  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  ADVANCE DIRECTIVES:  Patient says her son is her healthcare power of attorney.  Patient says she has a living will.  CODE STATUS: Full code  PAST MEDICAL HISTORY: Past Medical History:  Diagnosis Date  . Essential hypertension   . Pancreatic cancer ([place])   . UTI (urinary tract infection)     PAST SURGICAL HISTORY:  Past Surgical History:  Procedure Laterality Date  . CESAREAN SECTION    . CHOLECYSTECTOMY    . EUS N/A 10/25/2017   Procedure: FULL UPPER ENDOSCOPIC ULTRASOUND (EUS) RADIAL;  Surgeon: Jola Schmidt, MD;  Location: [facility] ENDOSCOPY;  Service: Endoscopy;  Laterality: N/A;  . OOPHORECTOMY    . PORTA CATH INSERTION N/A 11/12/2017   Procedure: PORTA CATH INSERTION;  Surgeon: Algernon Huxley, MD;  Location: [place] [facility];  Service: Cardiovascular;  Laterality: N/A;  . TUBAL LIGATION      HEMATOLOGY/ONCOLOGY HISTORY:      Pancreatic adenocarcinoma ([place])   10/18/2017 Initial Diagnosis    Pancreatic adenocarcinoma ([place])    10/26/2017 Cancer Staging    Staging form: Exocrine Pancreas, AJCC 8th Edition - Clinical stage from 10/26/2017: Stage IB (cT2, cN0, cM0) - Signed by Lloyd Huger, MD on 10/26/2017    11/06/2017 - 12/11/2017 Chemotherapy    The patient had palonosetron (ALOXI) injection 0.25 mg, 0.25 mg, Intravenous,  Once, 2 of 4 cycles Administration: 0.25 mg (11/14/2017), 0.25 mg (11/28/2017) irinotecan (CAMPTOSAR) 340 mg in dextrose 5 % 500 mL chemo infusion, 180 mg/m2 = 340 mg, Intravenous,  Once, 2 of 4 cycles Dose modification: 160 mg/m2 (original dose 180 mg/m2, Cycle 2, Reason: Dose not tolerated) Administration: 340 mg (11/14/2017), 300 mg (11/28/2017) leucovorin 750 mg in dextrose 5 % 250 mL infusion, 760 mg, Intravenous,  Once, 2 of 4 cycles Dose modification: 360 mg/m2 (original dose 400 mg/m2, Cycle 2, Reason: Dose not tolerated) Administration: 750 mg (11/14/2017), 700 mg (11/28/2017) oxaliplatin (ELOXATIN) 150 mg in dextrose 5 % 500 mL chemo infusion, 160 mg, Intravenous,  Once, 2 of 4 cycles Dose modification: 70 mg/m2 (original dose 85 mg/m2, Cycle 2, Reason: Dose not tolerated) Administration: 150 mg (11/14/2017), 135 mg (11/28/2017) fluorouracil (ADRUCIL) chemo injection 750 mg, 400 mg/m2 = 750 mg, Intravenous,  Once, 2 of 4 cycles Dose modification: 360 mg/m2 (original dose 400 mg/m2, Cycle 2, Reason: Dose not tolerated) Administration: 750 mg (11/14/2017), 700 mg (11/28/2017) fluorouracil (ADRUCIL) 4,550 mg in sodium chloride 0.9 % 59 mL chemo infusion, 2,400 mg/m2 = 4,550 mg, Intravenous, 1 Day/Dose, 2 of 4 cycles  Dose modification: 2,000 mg/m2 (original dose 2,400 mg/m2, Cycle 2, Reason: Dose not tolerated) Administration: 4,550 mg (11/14/2017), 3,800 mg (11/28/2017)  for chemotherapy treatment.     12/20/2017 -  Chemotherapy    The patient had gemcitabine (GEMZAR) 1,800 mg in sodium chloride  0.9 % 250 mL chemo infusion, 1,786 mg, Intravenous,  Once, 2 of 4 cycles Administration: 1,800 mg (12/26/2017), 1,800 mg (01/02/2018), 1,800 mg (01/09/2018), 1,800 mg (01/23/2018) PACLitaxel-protein bound (ABRAXANE) chemo infusion 225 mg, 125 mg/m2 = 225 mg, Intravenous, Once, 1 of 1 cycle  for chemotherapy treatment.      ALLERGIES:  is allergic to nsaids; ibuprofen; and penicillins.  MEDICATIONS:  Current Outpatient Medications  Medication Sig Dispense Refill  . docusate sodium (COLACE) 100 MG capsule Take 2 capsules (200 mg total) by mouth 2 (two) times daily. (Patient not taking: Reported on 12/26/2017) 30 capsule 0  . fentaNYL (DURAGESIC - DOSED MCG/HR) 25 MCG/HR patch Place 1 patch (25 mcg total) onto the skin every 3 (three) days. (Patient not taking: Reported on 01/23/2018) 10 patch 0  . loperamide (IMODIUM) 2 MG capsule Take by mouth as needed for diarrhea or loose stools.    Marland Kitchen losartan (COZAAR) 50 MG tablet Take 1 tablet (50 mg total) by mouth daily. 30 tablet 0  . ondansetron (ZOFRAN ODT) 4 MG disintegrating tablet Take 1 tablet (4 mg total) by mouth every 8 (eight) hours as needed for nausea or vomiting. 20 tablet 0  . Oxycodone HCl 10 MG TABS Take 1 tablet (10 mg total) by mouth every 6 (six) hours as needed. 60 tablet 0  . potassium chloride SA (K-DUR,KLOR-CON) 20 MEQ tablet Take 1 tablet (20 mEq total) by mouth daily. (Patient not taking: Reported on 01/23/2018) 30 tablet 1  . promethazine (PHENERGAN) 12.5 MG tablet Take 1 tablet (12.5 mg total) by mouth 3 (three) times daily before meals. 30 tablet 0   No current facility-administered medications for this visit.    Facility-Administered Medications Ordered in Other Visits  Medication Dose Route Frequency Provider Last Rate Last Dose  . heparin lock flush 100 unit/mL  500 Units Intracatheter Once PRN Lloyd Huger, MD      . prochlorperazine (COMPAZINE) injection 10 mg  10 mg Intravenous Once Rexene Agent        VITAL  SIGNS: There were no vitals taken for this visit. There were no vitals filed for this visit.  Estimated body mass index is 27.46 kg/m as calculated from the following:   Height as of 12/18/17: 5\' 3"  (1.6 m).   Weight as of an earlier encounter on 01/23/18: 155 lb (70.3 kg).  LABS: CBC:    Component Value Date/Time   WBC 7.4 01/23/2018 1008   HGB 9.6 (L) 01/23/2018 1008   HCT 28.2 (L) 01/23/2018 1008   PLT 371 01/23/2018 1008   MCV 96.2 01/23/2018 1008   NEUTROABS 3.2 01/23/2018 1008   LYMPHSABS 3.2 01/23/2018 1008   MONOABS 0.8 01/23/2018 1008   EOSABS 0.1 01/23/2018 1008   BASOSABS 0.0 01/23/2018 1008   Comprehensive Metabolic Panel:    Component Value Date/Time   NA 141 01/23/2018 1008   K 3.9 01/23/2018 1008   CL 109 01/23/2018 1008   CO2 24 01/23/2018 1008   BUN 10 01/23/2018 1008   CREATININE 1.23 (H) 01/23/2018 1008   GLUCOSE 130 (H) 01/23/2018 1008   CALCIUM 8.7 (L) 01/23/2018 1008   AST 42 (H) 01/23/2018 1008   ALT 35 01/23/2018 1008  ALKPHOS 95 01/23/2018 1008   BILITOT 0.6 01/23/2018 1008   PROT 6.9 01/23/2018 1008   ALBUMIN 3.1 (L) 01/23/2018 1008    RADIOGRAPHIC STUDIES: No results found.  PERFORMANCE STATUS (ECOG) : 1 - Symptomatic but completely ambulatory  Review of Systems As noted above. Otherwise, a complete review of systems is negative.  Physical Exam General: NAD, sitting in chair Cardiovascular: regular rate and rhythm Pulmonary: clear ant fields Abdomen: soft, nontender, + bowel sounds GU: no suprapubic tenderness Extremities: no edema, no joint deformities Skin: no rashes Neurological: Weakness but otherwise nonfocal  IMPRESSION: Patient seen in the infusion suite.  Symptomatically, patient denies pain, nausea vomiting, or other distressing symptoms.  She reports oral intake is adequate.  She has no acute complaints or concerns today.  Followed up with patient regarding her support system and emotional coping.  I offered referral  to our LCSW but patient did not feel this was necessary.  Her primary goal is to continue treatment with hope of disease improvement.  PLAN: 1.  Continue supportive care 2.  RTC in 1 month or sooner if needed   Patient expressed understanding and was in agreement with this plan. She also understands that She can call clinic at any time with any questions, concerns, or complaints.    Time Total: 15 minutes  Visit consisted of counseling and education dealing with the complex and emotionally intense issues of symptom management and palliative care in the setting of serious and potentially life-threatening illness.Greater than 50%  of this time was spent counseling and coordinating care related to the above assessment and plan.  Signed by: Altha Harm, [place], NP-C, [place] (Work Cell)

## 2018-01-27 NOTE — Progress Notes (Signed)
[place]  [phone] [phone]  ID: Cindy Bowman OB: August 05, 1950  MR#: [id number]  EPP#:[id number]  Patient Care Team: Glendon Axe, MD as PCP - General (Internal Medicine) Clent Jacks, RN as Registered Nurse Lloyd Huger, MD as Medical Oncologist (Medical Oncology)  CHIEF COMPLAINT: Clinical stage Ib pancreatic adenocarcinoma.  INTERVAL HISTORY: Patient returns to clinic today for repeat laboratory work, further evaluation, and consideration of cycle 2, day 8 of gemcitabine only.  Continues to have chronic weakness and fatigue, but otherwise is tolerating her treatments well.  She has a poor appetite, weight loss, and occasional diarrhea after she eats. She has no neurologic complaints.  She denies any fevers.  She denies any chest pain or shortness of breath.  She has no nausea, vomiting, or constipation.  She has no urinary complaints.  Patient offers no further specific complaints today.  REVIEW OF SYSTEMS:   Review of Systems  Constitutional: Positive for malaise/fatigue and weight loss. Negative for fever.  Respiratory: Negative.  Negative for cough and shortness of breath.   Cardiovascular: Negative.  Negative for chest pain and leg swelling.  Gastrointestinal: Positive for diarrhea. Negative for abdominal pain, blood in stool, melena, nausea and vomiting.  Genitourinary: Negative.  Negative for dysuria.  Musculoskeletal: Negative.  Negative for back pain.  Skin: Negative.  Negative for rash.  Neurological: Positive for weakness. Negative for dizziness, focal weakness and headaches.  Psychiatric/Behavioral: Negative.  The patient is not nervous/anxious.     As per HPI. Otherwise, a complete review of systems is negative.  PAST MEDICAL HISTORY: Past Medical History:  Diagnosis Date  . Essential hypertension   . Pancreatic cancer ([place])   . UTI (urinary tract infection)     PAST SURGICAL HISTORY: Past Surgical History:   Procedure Laterality Date  . CESAREAN SECTION    . CHOLECYSTECTOMY    . EUS N/A 10/25/2017   Procedure: FULL UPPER ENDOSCOPIC ULTRASOUND (EUS) RADIAL;  Surgeon: Jola Schmidt, MD;  Location: [facility] ENDOSCOPY;  Service: Endoscopy;  Laterality: N/A;  . OOPHORECTOMY    . PORTA CATH INSERTION N/A 11/12/2017   Procedure: PORTA CATH INSERTION;  Surgeon: Algernon Huxley, MD;  Location: [place] [facility];  Service: Cardiovascular;  Laterality: N/A;  . TUBAL LIGATION      FAMILY HISTORY: Family History  Problem Relation Age of Onset  . Hypertension Mother        deceased 32  . Heart attack Father        deceased late 60s    ADVANCED DIRECTIVES (Y/N):  N  HEALTH MAINTENANCE: Social History   Tobacco Use  . Smoking status: Never Smoker  . Smokeless tobacco: Never Used  Substance Use Topics  . Alcohol use: Not Currently  . Drug use: Never     Colonoscopy:  PAP:  Bone density:  Lipid panel:  Allergies  Allergen Reactions  . Nsaids Other (See Comments)    Decreased GFR  . Ibuprofen     Lowers kidney function  . Penicillins     Yeast infection  Has patient had a PCN reaction causing immediate rash, facial/tongue/throat swelling, SOB or lightheadedness with hypotension: No Has patient had a PCN reaction causing severe rash involving mucus membranes or skin necrosis: No Has patient had a PCN reaction that required hospitalization: No Has patient had a PCN reaction occurring within the last 10 years: No If all of the above answers are "NO", then may proceed with Cephalosporin use.  Current Outpatient Medications  Medication Sig Dispense Refill  . docusate sodium (COLACE) 100 MG capsule Take 2 capsules (200 mg total) by mouth 2 (two) times daily. 30 capsule 0  . fentaNYL (DURAGESIC - DOSED MCG/HR) 25 MCG/HR patch Place 1 patch (25 mcg total) onto the skin every 3 (three) days. 10 patch 0  . loperamide (IMODIUM) 2 MG capsule Take by mouth as needed for diarrhea or loose stools.     Marland Kitchen losartan (COZAAR) 50 MG tablet Take 1 tablet (50 mg total) by mouth daily. 30 tablet 0  . ondansetron (ZOFRAN ODT) 4 MG disintegrating tablet Take 1 tablet (4 mg total) by mouth every 8 (eight) hours as needed for nausea or vomiting. 20 tablet 0  . Oxycodone HCl 10 MG TABS Take 1 tablet (10 mg total) by mouth every 6 (six) hours as needed. 60 tablet 0  . promethazine (PHENERGAN) 12.5 MG tablet Take 1 tablet (12.5 mg total) by mouth 3 (three) times daily before meals. 30 tablet 0  . diphenoxylate-atropine (LOMOTIL) 2.5-0.025 MG tablet Take 1 tablet by mouth 4 (four) times daily as needed for diarrhea or loose stools. 30 tablet 0  . KLOR-CON M20 20 MEQ tablet TAKE 1 TABLET BY MOUTH EVERY DAY 90 tablet 1   No current facility-administered medications for this visit.    Facility-Administered Medications Ordered in Other Visits  Medication Dose Route Frequency Provider Last Rate Last Dose  . prochlorperazine (COMPAZINE) injection 10 mg  10 mg Intravenous Once Rexene Agent        OBJECTIVE: Vitals:   01/30/18 0859  BP: (!) 144/74  Pulse: (!) 105  Temp: 97.9 F (36.6 C)     Body mass index is 26.52 kg/m.    ECOG FS:0 - Asymptomatic  General: Thin, no acute distress.  Sitting in a wheelchair. Eyes: Pink conjunctiva, anicteric sclera. HEENT: Normocephalic, moist mucous membranes, clear oropharnyx. Lungs: Clear to auscultation bilaterally. Heart: Regular rate and rhythm. No rubs, murmurs, or gallops. Abdomen: Soft, nontender, nondistended. No organomegaly noted, normoactive bowel sounds. Musculoskeletal: No edema, cyanosis, or clubbing. Neuro: Alert, answering all questions appropriately. Cranial nerves grossly intact. Skin: No rashes or petechiae noted. Psych: Normal affect.  LAB RESULTS:  Lab Results  Component Value Date   NA 141 01/30/2018   K 3.1 (L) 01/30/2018   CL 111 01/30/2018   CO2 23 01/30/2018   GLUCOSE 128 (H) 01/30/2018   BUN 7 (L) 01/30/2018   CREATININE 1.13 (H)  01/30/2018   CALCIUM 9.0 01/30/2018   PROT 7.0 01/30/2018   ALBUMIN 3.2 (L) 01/30/2018   AST 57 (H) 01/30/2018   ALT 48 (H) 01/30/2018   ALKPHOS 102 01/30/2018   BILITOT 0.6 01/30/2018   GFRNONAA 50 (L) 01/30/2018   GFRAA 58 (L) 01/30/2018    Lab Results  Component Value Date   WBC 5.9 01/30/2018   NEUTROABS 1.6 (L) 01/30/2018   HGB 9.1 (L) 01/30/2018   HCT 27.9 (L) 01/30/2018   MCV 96.5 01/30/2018   PLT 222 01/30/2018     STUDIES: No results found.  ASSESSMENT: Clinical stage Ib pancreatic adenocarcinoma.  PLAN:    1.  Clinical stage Ib pancreatic adenocarcinoma: EUS completed on October 25, 2017 confirmed adenocarcinoma.   Patient's CA 19-9 is essentially unchanged at 81. PET scan results reviewed independently confirming no other disease outside the pancreas.  After review at cancer conference and with radiology, it appears the lesion is abutting the SMA, therefore patient will require neoadjuvant chemotherapy.  She was evaluated by surgery and is considered borderline resectable.  Patient may also require stereotactic XRT prior to surgery.  Repeat CT scan on December 13, 2017 reviewed independently with improvement of pancreatic mass, but still abutting the SMA.  Neoadjuvant FOLFIRINOX was too toxic for patient, therefore she was switched to single agent gemcitabine.  Will consider adding Abraxane if her performance status improves.  Plan is to give treatment on days 1, 8, and 15 with a 22 off.  Proceed with cycle 2, day 8 of gemcitabine only today.  Return to clinic in 1 week for further evaluation and consideration of cycle 2, day 15.     2.  Anemia: Hemoglobin trending down at 9.1.  Monitor.   3.  Pain: Patient not complain of this today. Continue fentanyl patch 25 mcg every 3 days.  Continue oxycodone as prescribed.   4.  Renal mass: CT scan results as above.  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 No intervention is needed at this time.  Continue to monitor while  patient is undergoing treatment for her pancreatic cancer.  Patient was previously given a referral to urology.   4.  Genetic: Genetic testing is pending at time of dictation. 5.  Chronic renal insufficiency: Patient's creatinine continues to improve and is now 1.13. 6.  Hypokalemia: Patient will receive 20 mEq IV potassium today. 7.  Diarrhea: Patient was given a prescription for Lomotil. 8.  Poor appetite/weight loss: Consider dietary referral in the near future.  Patient expressed understanding and was in agreement with this plan. She also understands that She can call clinic at any time with any questions, concerns, or complaints.   Cancer Staging Pancreatic adenocarcinoma [facility]) Staging form: Exocrine Pancreas, AJCC 8th Edition - Clinical stage from 10/26/2017: Stage IB (cT2, cN0, cM0) - Signed by Lloyd Huger, MD on 10/26/2017   Lloyd Huger, MD   02/01/2018 11:39 AM

## 2018-01-28 ENCOUNTER — Other Ambulatory Visit: Payer: Self-pay | Admitting: Oncology

## 2018-01-28 NOTE — Telephone Encounter (Signed)
)     Ref Range & Units 5d ago 2wk ago  Sodium 135 - 145 mmol/L 141  140   Potassium 3.5 - 5.1 mmol/L 3.9  3.4Low    Chloride 98 - 111 mmol/L 109  109   CO2 22 - 32 mmol/L 24  22   Glucose, Bld 70 - 99 mg/dL 130High   121High    BUN 8 - 23 mg/dL 10  8   Creatinine, Ser 0.44 - 1.00 mg/dL 1.23High   1.40High    Calcium 8.9 - 10.3 mg/dL 8.7Low   8.9   Total Protein 6.5 - 8.1 g/dL 6.9  7.2   Albumin 3.5 - 5.0 g/dL 3.1Low   3.1Low    AST 15 - 41 U/L 42High   56High    ALT 0 - 44 U/L 35  39   Alkaline Phosphatase 38 - 126 U/L 95  93   Total Bilirubin 0.3 - 1.2 mg/dL 0.6  0.4   GFR calc non Af Amer >60 mL/min 45Low   38Low    GFR calc Af Amer >60 mL/min 52Low   44Low  CM  Anion gap 5 - 15 8  9  CM  Comment: Performed at [facility], [address] [place], [place] [address]  Resulting Agency  [facility]      Specimen Collected: 01/23/18 10:08 Last Resulted: 01/23/18 10:32

## 2018-01-29 ENCOUNTER — Other Ambulatory Visit: Payer: Self-pay | Admitting: *Deleted

## 2018-01-29 DIAGNOSIS — C259 Malignant neoplasm of pancreas, unspecified: Secondary | ICD-10-CM

## 2018-01-30 ENCOUNTER — Other Ambulatory Visit: Payer: Self-pay | Admitting: Oncology

## 2018-01-30 ENCOUNTER — Inpatient Hospital Stay (HOSPITAL_BASED_OUTPATIENT_CLINIC_OR_DEPARTMENT_OTHER): Payer: Medicare Other | Admitting: Oncology

## 2018-01-30 ENCOUNTER — Inpatient Hospital Stay: Payer: Medicare Other

## 2018-01-30 ENCOUNTER — Other Ambulatory Visit: Payer: Self-pay

## 2018-01-30 VITALS — BP 144/74 | HR 105 | Temp 97.9°F | Ht 63.0 in | Wt 149.7 lb

## 2018-01-30 DIAGNOSIS — R197 Diarrhea, unspecified: Secondary | ICD-10-CM

## 2018-01-30 DIAGNOSIS — D649 Anemia, unspecified: Secondary | ICD-10-CM

## 2018-01-30 DIAGNOSIS — E876 Hypokalemia: Secondary | ICD-10-CM

## 2018-01-30 DIAGNOSIS — R5382 Chronic fatigue, unspecified: Secondary | ICD-10-CM

## 2018-01-30 DIAGNOSIS — C259 Malignant neoplasm of pancreas, unspecified: Secondary | ICD-10-CM

## 2018-01-30 DIAGNOSIS — Z5111 Encounter for antineoplastic chemotherapy: Secondary | ICD-10-CM | POA: Diagnosis not present

## 2018-01-30 DIAGNOSIS — N2889 Other specified disorders of kidney and ureter: Secondary | ICD-10-CM

## 2018-01-30 DIAGNOSIS — R634 Abnormal weight loss: Secondary | ICD-10-CM

## 2018-01-30 DIAGNOSIS — R630 Anorexia: Secondary | ICD-10-CM

## 2018-01-30 DIAGNOSIS — N189 Chronic kidney disease, unspecified: Secondary | ICD-10-CM | POA: Diagnosis not present

## 2018-01-30 DIAGNOSIS — R531 Weakness: Secondary | ICD-10-CM

## 2018-01-30 LAB — COMPREHENSIVE METABOLIC PANEL
ALK PHOS: 102 U/L (ref 38–126)
ALT: 48 U/L — ABNORMAL HIGH (ref 0–44)
AST: 57 U/L — AB (ref 15–41)
Albumin: 3.2 g/dL — ABNORMAL LOW (ref 3.5–5.0)
Anion gap: 7 (ref 5–15)
BUN: 7 mg/dL — ABNORMAL LOW (ref 8–23)
CALCIUM: 9 mg/dL (ref 8.9–10.3)
CHLORIDE: 111 mmol/L (ref 98–111)
CO2: 23 mmol/L (ref 22–32)
Creatinine, Ser: 1.13 mg/dL — ABNORMAL HIGH (ref 0.44–1.00)
GFR calc Af Amer: 58 mL/min — ABNORMAL LOW (ref >60)
GFR calc non Af Amer: 50 mL/min — ABNORMAL LOW (ref >60)
GLUCOSE: 128 mg/dL — AB (ref 70–99)
Potassium: 3.1 mmol/L — ABNORMAL LOW (ref 3.5–5.1)
Sodium: 141 mmol/L (ref 135–145)
TOTAL PROTEIN: 7 g/dL (ref 6.5–8.1)
Total Bilirubin: 0.6 mg/dL (ref 0.3–1.2)

## 2018-01-30 LAB — CBC WITH DIFFERENTIAL/PLATELET
Abs Immature Granulocytes: 0.22 10*3/uL — ABNORMAL HIGH (ref 0.00–0.07)
Basophils Absolute: 0 10*3/uL (ref 0.0–0.1)
Basophils Relative: 1 %
Eosinophils Absolute: 0.1 10*3/uL (ref 0.0–0.5)
Eosinophils Relative: 1 %
HCT: 27.9 % — ABNORMAL LOW (ref 36.0–46.0)
HEMOGLOBIN: 9.1 g/dL — AB (ref 12.0–15.0)
IMMATURE GRANULOCYTES: 4 %
Lymphocytes Relative: 58 %
Lymphs Abs: 3.4 10*3/uL (ref 0.7–4.0)
MCH: 31.5 pg (ref 26.0–34.0)
MCHC: 32.6 g/dL (ref 30.0–36.0)
MCV: 96.5 fL (ref 80.0–100.0)
Monocytes Absolute: 0.6 10*3/uL (ref 0.1–1.0)
Monocytes Relative: 9 %
Neutro Abs: 1.6 10*3/uL — ABNORMAL LOW (ref 1.7–7.7)
Neutrophils Relative %: 27 %
Platelets: 222 10*3/uL (ref 150–400)
RBC: 2.89 MIL/uL — ABNORMAL LOW (ref 3.87–5.11)
RDW: 20.4 % — ABNORMAL HIGH (ref 11.5–15.5)
WBC: 5.9 10*3/uL (ref 4.0–10.5)
nRBC: 0.8 % — ABNORMAL HIGH (ref 0.0–0.2)

## 2018-01-30 MED ORDER — POTASSIUM CHLORIDE 20 MEQ/100ML IV SOLN
20.0000 meq | Freq: Once | INTRAVENOUS | Status: AC
  Administered 2018-01-30: 20 meq via INTRAVENOUS

## 2018-01-30 MED ORDER — PROCHLORPERAZINE MALEATE 10 MG PO TABS
10.0000 mg | ORAL_TABLET | Freq: Once | ORAL | Status: AC
  Administered 2018-01-30: 10 mg via ORAL
  Filled 2018-01-30: qty 1

## 2018-01-30 MED ORDER — HEPARIN SOD (PORK) LOCK FLUSH 100 UNIT/ML IV SOLN
500.0000 [IU] | Freq: Once | INTRAVENOUS | Status: AC | PRN
  Administered 2018-01-30: 500 [IU]
  Filled 2018-01-30: qty 5

## 2018-01-30 MED ORDER — DIPHENOXYLATE-ATROPINE 2.5-0.025 MG PO TABS: 1.0000 | ORAL_TABLET | Freq: Four times a day (QID) | ORAL | 0 refills | Status: DC | PRN

## 2018-01-30 MED ORDER — SODIUM CHLORIDE 0.9 % IV SOLN
Freq: Once | INTRAVENOUS | Status: AC
  Administered 2018-01-30: 10:00:00 via INTRAVENOUS
  Filled 2018-01-30: qty 250

## 2018-01-30 MED ORDER — SODIUM CHLORIDE 0.9 % IV SOLN
1800.0000 mg | Freq: Once | INTRAVENOUS | Status: AC
  Administered 2018-01-30: 1800 mg via INTRAVENOUS
  Filled 2018-01-30: qty 26.3

## 2018-01-30 NOTE — Progress Notes (Signed)
1000: Per Dr. Grayland Ormond okay to proceed with treatment with Potassium 3.1 and HR 105, pt to receive IV potassium as ordered.   Per Micromedex Gemzar and Potassium compatible.

## 2018-01-30 NOTE — Progress Notes (Signed)
Patient is here to follow up on her pancreatic adenocarcinoma. Patient stated that she does not have an appetite. She stated that she makes herself eat, chews her food but doesn't want to swallow it (no taste to it). She also stated that she is drinking two Ensures twice a day. However, when she either drinks or eats anything, she gets diarrhea. Patient had lost 5 lbs since last week (01-30-2018).

## 2018-02-01 ENCOUNTER — Other Ambulatory Visit: Payer: Self-pay | Admitting: *Deleted

## 2018-02-01 MED ORDER — POTASSIUM CHLORIDE CRYS ER 20 MEQ PO TBCR: 20.0000 meq | EXTENDED_RELEASE_TABLET | Freq: Every day | ORAL | 1 refills | Status: DC

## 2018-02-01 MED ORDER — DIPHENOXYLATE-ATROPINE 2.5-0.025 MG PO TABS: 1.0000 | ORAL_TABLET | Freq: Four times a day (QID) | ORAL | 0 refills | Status: DC | PRN

## 2018-02-01 NOTE — Telephone Encounter (Signed)
Patient states she was to have Lomotil prescription sent in Wednesday, but pharmacy has not received it. Loperamide is not controlling Please re send as our E scribe was down

## 2018-02-03 NOTE — Progress Notes (Signed)
[place]  [phone] [phone]) [phone]  ID: Cindy Bowman OB: [birth year]  MR#: [id number]  BDZ#:[id number]  Patient Care Team: Glendon Axe, MD as PCP - General (Internal Medicine) Clent Jacks, RN as Registered Nurse Lloyd Huger, MD as Medical Oncologist (Medical Oncology)  CHIEF COMPLAINT: Clinical stage Ib pancreatic adenocarcinoma.  INTERVAL HISTORY: Patient returns to clinic today for repeat laboratory work, further evaluation, and consideration of cycle 2, day 15 of gemcitabine.  She continues to have chronic weakness and fatigue.  Over the past several days she has noticed increased swelling and pain in her left calf.  She continues to have a poor appetite.  She has no neurologic complaints.  She denies any fevers.  She denies any chest pain or shortness of breath.  She has no nausea, vomiting, or constipation.  She has no urinary complaints.  Patient offers no further specific complaints today.  REVIEW OF SYSTEMS:   Review of Systems  Constitutional: Positive for malaise/fatigue and weight loss. Negative for fever.  Respiratory: Negative.  Negative for cough and shortness of breath.   Cardiovascular: Positive for leg swelling. Negative for chest pain.  Gastrointestinal: Positive for diarrhea. Negative for abdominal pain, blood in stool, melena, nausea and vomiting.  Genitourinary: Negative.  Negative for dysuria.  Musculoskeletal: Negative.  Negative for back pain.  Skin: Negative.  Negative for rash.  Neurological: Positive for weakness. Negative for dizziness, focal weakness and headaches.  Psychiatric/Behavioral: Negative.  The patient is not nervous/anxious.     As per HPI. Otherwise, a complete review of systems is negative.  PAST MEDICAL HISTORY: Past Medical History:  Diagnosis Date  . Essential hypertension   . Pancreatic cancer ([place])   . UTI (urinary tract infection)     PAST SURGICAL HISTORY: Past Surgical  History:  Procedure Laterality Date  . CESAREAN SECTION    . CHOLECYSTECTOMY    . EUS N/A 10/25/2017   Procedure: FULL UPPER ENDOSCOPIC ULTRASOUND (EUS) RADIAL;  Surgeon: Jola Schmidt, MD;  Location: [facility] ENDOSCOPY;  Service: Endoscopy;  Laterality: N/A;  . OOPHORECTOMY    . PORTA CATH INSERTION N/A 11/12/2017   Procedure: PORTA CATH INSERTION;  Surgeon: Algernon Huxley, MD;  Location: [place] [facility];  Service: Cardiovascular;  Laterality: N/A;  . TUBAL LIGATION      FAMILY HISTORY: Family History  Problem Relation Age of Onset  . Hypertension Mother        deceased 65  . Heart attack Father        deceased late 41s    ADVANCED DIRECTIVES (Y/N):  N  HEALTH MAINTENANCE: Social History   Tobacco Use  . Smoking status: Never Smoker  . Smokeless tobacco: Never Used  Substance Use Topics  . Alcohol use: Not Currently  . Drug use: Never     Colonoscopy:  PAP:  Bone density:  Lipid panel:  Allergies  Allergen Reactions  . Nsaids Other (See Comments)    Decreased GFR  . Ibuprofen     Lowers kidney function  . Penicillins     Yeast infection  Has patient had a PCN reaction causing immediate rash, facial/tongue/throat swelling, SOB or lightheadedness with hypotension: No Has patient had a PCN reaction causing severe rash involving mucus membranes or skin necrosis: No Has patient had a PCN reaction that required hospitalization: No Has patient had a PCN reaction occurring within the last 10 years: No If all of the above answers are "NO", then may proceed with Cephalosporin  use.     Current Outpatient Medications  Medication Sig Dispense Refill  . diphenoxylate-atropine (LOMOTIL) 2.5-0.025 MG tablet Take 1 tablet by mouth 4 (four) times daily as needed for diarrhea or loose stools. 30 tablet 0  . docusate sodium (COLACE) 100 MG capsule Take 2 capsules (200 mg total) by mouth 2 (two) times daily. 30 capsule 0  . fentaNYL (DURAGESIC - DOSED MCG/HR) 25 MCG/HR patch Place  1 patch (25 mcg total) onto the skin every 3 (three) days. 10 patch 0  . loperamide (IMODIUM) 2 MG capsule Take by mouth as needed for diarrhea or loose stools.    Marland Kitchen losartan (COZAAR) 50 MG tablet Take 1 tablet (50 mg total) by mouth daily. 30 tablet 0  . ondansetron (ZOFRAN ODT) 4 MG disintegrating tablet Take 1 tablet (4 mg total) by mouth every 8 (eight) hours as needed for nausea or vomiting. 20 tablet 0  . potassium chloride SA (KLOR-CON M20) 20 MEQ tablet Take 1 tablet (20 mEq total) by mouth daily. 90 tablet 1  . promethazine (PHENERGAN) 12.5 MG tablet Take 1 tablet (12.5 mg total) by mouth 3 (three) times daily before meals. 30 tablet 0  . ELIQUIS STARTER PACK (ELIQUIS STARTER PACK) 5 MG TABS Take as directed on package: start with two-5mg  tablets twice daily for 7 days. On day 8, switch to one-5mg  tablet twice daily. 1 each 0  . Oxycodone HCl 10 MG TABS Take 1 tablet (10 mg total) by mouth every 6 (six) hours as needed. 60 tablet 0   No current facility-administered medications for this visit.    Facility-Administered Medications Ordered in Other Visits  Medication Dose Route Frequency Provider Last Rate Last Dose  . prochlorperazine (COMPAZINE) injection 10 mg  10 mg Intravenous Once Rexene Agent        OBJECTIVE: Vitals:   02/06/18 1111  BP: 121/82  Pulse: (!) 110  Temp: 97.9 F (36.6 C)     There is no height or weight on file to calculate BMI.    ECOG FS:0 - Asymptomatic  General: Thin, no acute distress.  Sitting in a wheelchair. Eyes: Pink conjunctiva, anicteric sclera. HEENT: Normocephalic, moist mucous membranes, clear oropharnyx. Lungs: Clear to auscultation bilaterally. Heart: Regular rate and rhythm. No rubs, murmurs, or gallops. Abdomen: Soft, nontender, nondistended. No organomegaly noted, normoactive bowel sounds. Musculoskeletal: Left lower extremity swelling greater than right. Neuro: Alert, answering all questions appropriately. Cranial nerves grossly  intact. Skin: No rashes or petechiae noted. Psych: Normal affect.  LAB RESULTS:  Lab Results  Component Value Date   NA 141 02/06/2018   K 3.0 (L) 02/06/2018   CL 112 (H) 02/06/2018   CO2 20 (L) 02/06/2018   GLUCOSE 129 (H) 02/06/2018   BUN 10 02/06/2018   CREATININE 1.20 (H) 02/06/2018   CALCIUM 9.0 02/06/2018   PROT 7.2 02/06/2018   ALBUMIN 3.1 (L) 02/06/2018   AST 55 (H) 02/06/2018   ALT 47 (H) 02/06/2018   ALKPHOS 105 02/06/2018   BILITOT 0.9 02/06/2018   GFRNONAA 46 (L) 02/06/2018   GFRAA 54 (L) 02/06/2018    Lab Results  Component Value Date   WBC 4.2 02/06/2018   NEUTROABS 1.2 (L) 02/06/2018   HGB 8.1 (L) 02/06/2018   HCT 24.3 (L) 02/06/2018   MCV 96.8 02/06/2018   PLT 76 (L) 02/06/2018     STUDIES: US Venous Img Lower Unilateral Left  Result Date: 02/06/2018 CLINICAL DATA:  Left lower extremity pain.  Pancreatic cancer. EXAM:  LEFT LOWER EXTREMITY VENOUS DUPLEX ULTRASOUND TECHNIQUE: Doppler venous assessment of the left lower extremity deep venous system was performed, including characterization of spectral flow, compressibility, and phasicity. COMPARISON:  None. FINDINGS: The common femoral vein, proximal femoral vein, and mid femoral vein are compressible. The distal femoral vein is noncompressible and contains occlusive thrombus. There is also occlusive thrombus throughout the popliteal vein. There is nonocclusive thrombus in the posterior tibial and peroneal veins. There is also superficial thrombus in the left lesser saphenous vein. Doppler analysis in the common femoral and femoral vein demonstrates phasicity. Augmentation was deferred. 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 There is nonocclusive thrombus within deep calf veins. Electronically Signed   By: Marybelle Killings M.D.   On: 02/06/2018 16:36    ASSESSMENT: Clinical stage Ib pancreatic adenocarcinoma.  PLAN:    1.  Clinical  stage Ib pancreatic adenocarcinoma: EUS completed on October 25, 2017 confirmed adenocarcinoma.   Patient's CA 19-9 is essentially unchanged at 81. PET scan results reviewed independently confirming no other disease outside the pancreas.  After review at cancer conference and with radiology, it appears the lesion is abutting the SMA, therefore patient will require neoadjuvant chemotherapy.  She was evaluated by surgery and is considered borderline resectable.  Patient may also require stereotactic XRT prior to surgery.  Repeat CT scan on December 13, 2017 reviewed independently with improvement of pancreatic mass, but still abutting the SMA.  Neoadjuvant FOLFIRINOX was too toxic for patient, therefore she was switched to single agent gemcitabine.  Will consider adding Abraxane if her performance status improves.  Plan is to give treatment on days 1, 8, and 15 with a 22 off.  We will delay cycle 2, day 15 of gemcitabine secondary to decreased performance status and thrombocytopenia.  Will reimage with CT scan next week and patient return to clinic in 2 weeks for further evaluation and consideration of cycle 3, day 1.    2.  Anemia: Hemoglobin continues to trend down and is now 8.1.  Monitor. 3.  Pain: Patient not complain of this today. Continue fentanyl patch 25 mcg every 3 days.  Continue oxycodone as prescribed.   4.  Renal mass: CT scan results as above.  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 No intervention is needed at this time.  Continue to monitor while patient is undergoing treatment for her pancreatic cancer.  Patient was previously given a referral to urology.   4.  Genetic: Genetic testing is pending at time of dictation. 5.  Chronic renal insufficiency: Patient's creatinine is slightly elevated at 1.2.  Monitor. 6.  Hypokalemia: Potassium level is 3.0 today.  Patient will receive 20 mEq IV potassium in clinic. 7.  Diarrhea: Continue Lomotil as needed. 8.  Poor appetite/weight loss:  Patient has declined dietary referral at this time. 9.  Left lower extremity DVT: Confirmed by ultrasound as above.  Patient was given a prescription for Eliquis.  Patient expressed understanding and was in agreement with this plan. She also understands that She can call clinic at any time with any questions, concerns, or complaints.   Cancer Staging Pancreatic adenocarcinoma [facility]) Staging form: Exocrine Pancreas, AJCC 8th Edition - Clinical stage from 10/26/2017: Stage IB (cT2, cN0, cM0) - Signed by Lloyd Huger, MD on 10/26/2017   Lloyd Huger, MD   02/08/2018 2:53 PM

## 2018-02-06 ENCOUNTER — Inpatient Hospital Stay: Payer: Medicare Other

## 2018-02-06 ENCOUNTER — Inpatient Hospital Stay (HOSPITAL_BASED_OUTPATIENT_CLINIC_OR_DEPARTMENT_OTHER): Payer: Medicare Other | Admitting: Oncology

## 2018-02-06 ENCOUNTER — Other Ambulatory Visit: Payer: Self-pay | Admitting: *Deleted

## 2018-02-06 ENCOUNTER — Other Ambulatory Visit: Payer: Self-pay

## 2018-02-06 ENCOUNTER — Ambulatory Visit
Admission: RE | Admit: 2018-02-06 | Discharge: 2018-02-06 | Disposition: A | Discharge status: Home / Self Care | Payer: Medicare Other | Source: Ambulatory Visit | Attending: Oncology | Admitting: Oncology

## 2018-02-06 VITALS — BP 121/82 | HR 110 | Temp 97.9°F

## 2018-02-06 DIAGNOSIS — I824Z2 Acute embolism and thrombosis of unspecified deep veins of left distal lower extremity: Secondary | ICD-10-CM

## 2018-02-06 DIAGNOSIS — D649 Anemia, unspecified: Secondary | ICD-10-CM | POA: Diagnosis not present

## 2018-02-06 DIAGNOSIS — R531 Weakness: Secondary | ICD-10-CM

## 2018-02-06 DIAGNOSIS — R5382 Chronic fatigue, unspecified: Secondary | ICD-10-CM

## 2018-02-06 DIAGNOSIS — M79605 Pain in left leg: Secondary | ICD-10-CM

## 2018-02-06 DIAGNOSIS — I82432 Acute embolism and thrombosis of left popliteal vein: Secondary | ICD-10-CM | POA: Diagnosis not present

## 2018-02-06 DIAGNOSIS — C259 Malignant neoplasm of pancreas, unspecified: Secondary | ICD-10-CM

## 2018-02-06 DIAGNOSIS — N189 Chronic kidney disease, unspecified: Secondary | ICD-10-CM

## 2018-02-06 DIAGNOSIS — I82412 Acute embolism and thrombosis of left femoral vein: Secondary | ICD-10-CM | POA: Insufficient documentation

## 2018-02-06 DIAGNOSIS — E876 Hypokalemia: Secondary | ICD-10-CM

## 2018-02-06 DIAGNOSIS — N2889 Other specified disorders of kidney and ureter: Secondary | ICD-10-CM

## 2018-02-06 DIAGNOSIS — Z5111 Encounter for antineoplastic chemotherapy: Secondary | ICD-10-CM | POA: Diagnosis not present

## 2018-02-06 DIAGNOSIS — R630 Anorexia: Secondary | ICD-10-CM

## 2018-02-06 DIAGNOSIS — R197 Diarrhea, unspecified: Secondary | ICD-10-CM

## 2018-02-06 LAB — CBC WITH DIFFERENTIAL/PLATELET
Abs Immature Granulocytes: 0.16 10*3/uL — ABNORMAL HIGH (ref 0.00–0.07)
Basophils Absolute: 0 10*3/uL (ref 0.0–0.1)
Basophils Relative: 1 %
Eosinophils Absolute: 0 10*3/uL (ref 0.0–0.5)
Eosinophils Relative: 0 %
HEMATOCRIT: 24.3 % — AB (ref 36.0–46.0)
Hemoglobin: 8.1 g/dL — ABNORMAL LOW (ref 12.0–15.0)
Immature Granulocytes: 4 %
Lymphocytes Relative: 62 %
Lymphs Abs: 2.6 10*3/uL (ref 0.7–4.0)
MCH: 32.3 pg (ref 26.0–34.0)
MCHC: 33.3 g/dL (ref 30.0–36.0)
MCV: 96.8 fL (ref 80.0–100.0)
Monocytes Absolute: 0.2 10*3/uL (ref 0.1–1.0)
Monocytes Relative: 5 %
NEUTROS ABS: 1.2 10*3/uL — AB (ref 1.7–7.7)
Neutrophils Relative %: 28 %
Platelets: 76 10*3/uL — ABNORMAL LOW (ref 150–400)
RBC: 2.51 MIL/uL — ABNORMAL LOW (ref 3.87–5.11)
RDW: 20.7 % — ABNORMAL HIGH (ref 11.5–15.5)
WBC: 4.2 10*3/uL (ref 4.0–10.5)
nRBC: 1.9 % — ABNORMAL HIGH (ref 0.0–0.2)

## 2018-02-06 LAB — COMPREHENSIVE METABOLIC PANEL
ALT: 47 U/L — ABNORMAL HIGH (ref 0–44)
AST: 55 U/L — AB (ref 15–41)
Albumin: 3.1 g/dL — ABNORMAL LOW (ref 3.5–5.0)
Alkaline Phosphatase: 105 U/L (ref 38–126)
Anion gap: 9 (ref 5–15)
BUN: 10 mg/dL (ref 8–23)
CO2: 20 mmol/L — AB (ref 22–32)
Calcium: 9 mg/dL (ref 8.9–10.3)
Chloride: 112 mmol/L — ABNORMAL HIGH (ref 98–111)
Creatinine, Ser: 1.2 mg/dL — ABNORMAL HIGH (ref 0.44–1.00)
GFR calc Af Amer: 54 mL/min — ABNORMAL LOW (ref >60)
GFR calc non Af Amer: 46 mL/min — ABNORMAL LOW (ref >60)
Glucose, Bld: 129 mg/dL — ABNORMAL HIGH (ref 70–99)
Potassium: 3 mmol/L — ABNORMAL LOW (ref 3.5–5.1)
Sodium: 141 mmol/L (ref 135–145)
Total Bilirubin: 0.9 mg/dL (ref 0.3–1.2)
Total Protein: 7.2 g/dL (ref 6.5–8.1)

## 2018-02-06 MED ORDER — ELIQUIS 5 MG VTE STARTER PACK: ORAL_TABLET | ORAL | 0 refills | Status: DC

## 2018-02-06 MED ORDER — OXYCODONE HCL 10 MG PO TABS: 10.0000 mg | ORAL_TABLET | Freq: Four times a day (QID) | ORAL | 0 refills | Status: DC | PRN

## 2018-02-06 MED ORDER — SODIUM CHLORIDE 0.9% FLUSH
10.0000 mL | INTRAVENOUS | Status: DC | PRN
  Administered 2018-02-06: 10 mL via INTRAVENOUS
  Filled 2018-02-06: qty 10

## 2018-02-06 MED ORDER — HEPARIN SOD (PORK) LOCK FLUSH 100 UNIT/ML IV SOLN
500.0000 [IU] | Freq: Once | INTRAVENOUS | Status: AC
  Administered 2018-02-06: 500 [IU] via INTRAVENOUS
  Filled 2018-02-06: qty 5

## 2018-02-06 MED ORDER — SODIUM CHLORIDE 0.9 % IV SOLN
Freq: Once | INTRAVENOUS | Status: AC
  Administered 2018-02-06: 12:00:00 via INTRAVENOUS
  Filled 2018-02-06: qty 1000

## 2018-02-06 MED ORDER — OXYCODONE HCL 5 MG PO TABS
10.0000 mg | ORAL_TABLET | Freq: Once | ORAL | Status: AC
  Administered 2018-02-06: 10 mg via ORAL
  Filled 2018-02-06: qty 2

## 2018-02-06 NOTE — Progress Notes (Signed)
Patient is here today to follow up on her pancreatic adenocarcinoma. Patient stated that she started to have pain/ache on her left lower extremity. Patient stated that she kept her leg elevated but not getting better.

## 2018-02-07 ENCOUNTER — Telehealth: Payer: Self-pay | Admitting: *Deleted

## 2018-02-07 ENCOUNTER — Ambulatory Visit: Payer: Medicare Other

## 2018-02-07 NOTE — Telephone Encounter (Signed)
Patient notified of results from [place] of left leg performed yesterday. Patient educated about Eliquis, all questions answered. Patient has picked up prescription for Eliquis and started medication at this time.

## 2018-02-15 ENCOUNTER — Ambulatory Visit
Admission: RE | Admit: 2018-02-15 | Discharge: 2018-02-15 | Disposition: A | Discharge status: Home / Self Care | Payer: Medicare Other | Source: Ambulatory Visit | Attending: Oncology | Admitting: Oncology

## 2018-02-15 DIAGNOSIS — C259 Malignant neoplasm of pancreas, unspecified: Secondary | ICD-10-CM | POA: Diagnosis not present

## 2018-02-15 MED ORDER — IOPAMIDOL (ISOVUE-300) INJECTION 61%
100.0000 mL | Freq: Once | INTRAVENOUS | Status: AC | PRN
  Administered 2018-02-15: 100 mL via INTRAVENOUS

## 2018-02-15 NOTE — Progress Notes (Signed)
I have sent for Dr. Barry Dienes to review.

## 2018-02-15 NOTE — Progress Notes (Addendum)
[place]  [phone] [phone]) [phone]  ID: Cindy Bowman OB: Aug 10, 1950  MR#: [id number]  CHY#:[id number]  Patient Care Team: Glendon Axe, MD as PCP - General (Internal Medicine) Clent Jacks, RN as Registered Nurse Lloyd Huger, MD as Medical Oncologist (Medical Oncology)  CHIEF COMPLAINT: Clinical stage Ib pancreatic adenocarcinoma.  INTERVAL HISTORY: Patient returns to clinic today for further evaluation, discussion of her imaging results, and consideration of cycle 3, day 1 of single agent gemcitabine.  She continues to have chronic weakness and fatigue, but otherwise feels well.  She does not complain of pain in her calf today.  She continues to have a poor appetite.  She has no neurologic complaints.  She denies any fevers.  She denies any chest pain or shortness of breath.  She has no nausea, vomiting, or constipation.  She has no urinary complaints.  Patient offers no further specific complaints today.  REVIEW OF SYSTEMS:   Review of Systems  Constitutional: Positive for malaise/fatigue and weight loss. Negative for fever.  Respiratory: Negative.  Negative for cough and shortness of breath.   Cardiovascular: Negative for chest pain and leg swelling.  Gastrointestinal: Negative for abdominal pain, blood in stool, diarrhea, melena, nausea and vomiting.  Genitourinary: Negative.  Negative for dysuria.  Musculoskeletal: Negative.  Negative for back pain.  Skin: Negative.  Negative for rash.  Neurological: Positive for weakness. Negative for dizziness, focal weakness and headaches.  Psychiatric/Behavioral: Negative.  The patient is not nervous/anxious.     As per HPI. Otherwise, a complete review of systems is negative.  PAST MEDICAL HISTORY: Past Medical History:  Diagnosis Date  . Essential hypertension   . Pancreatic cancer ([place])   . UTI (urinary tract infection)     PAST SURGICAL HISTORY: Past Surgical History:  Procedure  Laterality Date  . CESAREAN SECTION    . CHOLECYSTECTOMY    . EUS N/A 10/25/2017   Procedure: FULL UPPER ENDOSCOPIC ULTRASOUND (EUS) RADIAL;  Surgeon: Jola Schmidt, MD;  Location: [facility] ENDOSCOPY;  Service: Endoscopy;  Laterality: N/A;  . OOPHORECTOMY    . PORTA CATH INSERTION N/A 11/12/2017   Procedure: PORTA CATH INSERTION;  Surgeon: Algernon Huxley, MD;  Location: [facility];  Service: Cardiovascular;  Laterality: N/A;  . TUBAL LIGATION      FAMILY HISTORY: Family History  Problem Relation Age of Onset  . Hypertension Mother        deceased 68  . Heart attack Father        deceased late 54s    ADVANCED DIRECTIVES (Y/N):  N  HEALTH MAINTENANCE: Social History   Tobacco Use  . Smoking status: Never Smoker  . Smokeless tobacco: Never Used  Substance Use Topics  . Alcohol use: Not Currently  . Drug use: Never     Colonoscopy:  PAP:  Bone density:  Lipid panel:  Allergies  Allergen Reactions  . Nsaids Other (See Comments)    Decreased GFR  . Ibuprofen     Lowers kidney function  . Penicillins     Yeast infection  Has patient had a PCN reaction causing immediate rash, facial/tongue/throat swelling, SOB or lightheadedness with hypotension: No Has patient had a PCN reaction causing severe rash involving mucus membranes or skin necrosis: No Has patient had a PCN reaction that required hospitalization: No Has patient had a PCN reaction occurring within the last 10 years: No If all of the above answers are "NO", then may proceed with Cephalosporin use.  Current Outpatient Medications  Medication Sig Dispense Refill  . ELIQUIS STARTER PACK (ELIQUIS STARTER PACK) 5 MG TABS Take as directed on package: start with two-5mg  tablets twice daily for 7 days. On day 8, switch to one-5mg  tablet twice daily. 1 each 0  . losartan (COZAAR) 50 MG tablet Take 1 tablet (50 mg total) by mouth daily. 30 tablet 0  . Oxycodone HCl 10 MG TABS Take 1 tablet (10 mg total) by mouth  every 6 (six) hours as needed. 60 tablet 0  . diphenoxylate-atropine (LOMOTIL) 2.5-0.025 MG tablet Take 1 tablet by mouth 4 (four) times daily as needed for diarrhea or loose stools. (Patient not taking: Reported on 02/19/2018) 30 tablet 0  . docusate sodium (COLACE) 100 MG capsule Take 2 capsules (200 mg total) by mouth 2 (two) times daily. (Patient not taking: Reported on 02/19/2018) 30 capsule 0  . fentaNYL (DURAGESIC - DOSED MCG/HR) 25 MCG/HR patch Place 1 patch (25 mcg total) onto the skin every 3 (three) days. (Patient not taking: Reported on 02/19/2018) 10 patch 0  . loperamide (IMODIUM) 2 MG capsule Take by mouth as needed for diarrhea or loose stools.    . ondansetron (ZOFRAN ODT) 4 MG disintegrating tablet Take 1 tablet (4 mg total) by mouth every 8 (eight) hours as needed for nausea or vomiting. (Patient not taking: Reported on 02/19/2018) 20 tablet 0  . potassium chloride SA (KLOR-CON M20) 20 MEQ tablet Take 1 tablet (20 mEq total) by mouth daily. (Patient not taking: Reported on 02/19/2018) 90 tablet 1  . promethazine (PHENERGAN) 12.5 MG tablet Take 1 tablet (12.5 mg total) by mouth 3 (three) times daily before meals. (Patient not taking: Reported on 02/19/2018) 30 tablet 0   No current facility-administered medications for this visit.    Facility-Administered Medications Ordered in Other Visits  Medication Dose Route Frequency Provider Last Rate Last Dose  . prochlorperazine (COMPAZINE) injection 10 mg  10 mg Intravenous Once Rexene Agent        OBJECTIVE: Vitals:   02/19/18 0904  BP: (!) 147/73  Pulse: 95  Resp: 18  Temp: 97.8 F (36.6 C)     Body mass index is 25.93 kg/m.    ECOG FS:0 - Asymptomatic  General: Thin, no acute distress.  Sitting in a wheelchair. Eyes: Pink conjunctiva, anicteric sclera. HEENT: Normocephalic, moist mucous membranes, clear oropharnyx. Lungs: Clear to auscultation bilaterally. Heart: Regular rate and rhythm. No rubs, murmurs, or  gallops. Abdomen: Soft, nontender, nondistended. No organomegaly noted, normoactive bowel sounds. Musculoskeletal: No edema, cyanosis, or clubbing. Neuro: Alert, answering all questions appropriately. Cranial nerves grossly intact. Skin: No rashes or petechiae noted. Psych: Normal affect.  LAB RESULTS:  Lab Results  Component Value Date   NA 142 02/19/2018   K 2.8 (L) 02/19/2018   CL 111 02/19/2018   CO2 21 (L) 02/19/2018   GLUCOSE 143 (H) 02/19/2018   BUN 9 02/19/2018   CREATININE 0.99 02/19/2018   CALCIUM 9.0 02/19/2018   PROT 6.8 02/19/2018   ALBUMIN 3.0 (L) 02/19/2018   AST 34 02/19/2018   ALT 24 02/19/2018   ALKPHOS 96 02/19/2018   BILITOT 0.7 02/19/2018   GFRNONAA 59 (L) 02/19/2018   GFRAA >60 02/19/2018    Lab Results  Component Value Date   WBC 9.7 02/19/2018   NEUTROABS 4.8 02/19/2018   HGB 8.9 (L) 02/19/2018   HCT 27.1 (L) 02/19/2018   MCV 101.1 (H) 02/19/2018   PLT 490 (H) 02/19/2018  STUDIES: Ct Abdomen Pelvis W Contrast  Result Date: 02/15/2018 CLINICAL DATA:  67 year old female for follow-up/restaging of pancreatic malignancy. Currently undergoing chemotherapy. EXAM: CT ABDOMEN AND PELVIS WITH CONTRAST TECHNIQUE: Multidetector CT imaging of the abdomen and pelvis was performed using the standard protocol following bolus administration of intravenous contrast. CONTRAST:  157mL ISOVUE-300 IOPAMIDOL (ISOVUE-300) INJECTION 61% COMPARISON:  12/12/2017 CT and prior studies FINDINGS: Lower chest: No acute or new abnormalities. Hepatobiliary: Hepatic steatosis again noted. No focal hepatic lesions are present. Patient is status post cholecystectomy. No biliary dilatation. Pancreas: Pancreatic uncinate process mass is unchanged in size measuring 2.5 x 2.7 cm (series 2: Image 26). Contact with the SMA and SMV again noted. The remainder of the pancreas is unremarkable. Spleen: Unremarkable Adrenals/Urinary Tract: A 3 cm enhancing solid and cystic mass involving the  LATERAL LEFT mid kidney is unchanged. There is no evidence of hydronephrosis or new renal abnormality. The adrenal glands are unremarkable. Bladder unremarkable with known bladder diverticulum difficult to visualized on this exam. Stomach/Bowel: No bowel wall thickening, bowel obstruction or inflammatory changes. Colonic diverticulosis noted without evidence of diverticulitis. Vascular/Lymphatic: No significant vascular findings are present. No enlarged abdominal or pelvic lymph nodes. Reproductive: Uterus and bilateral adnexa are unremarkable. Other: No ascites or peritoneal/omental abnormality. A moderate supraumbilical ventral hernia containing fat is unchanged. No pneumoperitoneum or focal collection/abscess. Musculoskeletal: No acute or suspicious bony abnormalities. IMPRESSION: 1. No significant change in 2.7 cm pancreatic uncinate process mass since 12/12/2017. No evidence of distant metastases. 2. Unchanged 3 cm LEFT renal mass highly suspicious for renal cell carcinoma. 3. No new or significant changes from the prior study. Electronically Signed   By: Margarette Canada M.D.   On: 02/15/2018 09:45   US Venous Img Lower Unilateral Left  Result Date: 02/06/2018 CLINICAL DATA:  Left lower extremity pain.  Pancreatic cancer. EXAM: LEFT LOWER EXTREMITY VENOUS DUPLEX ULTRASOUND TECHNIQUE: Doppler venous assessment of the left lower extremity deep venous system was performed, including characterization of spectral flow, compressibility, and phasicity. COMPARISON:  None. FINDINGS: The common femoral vein, proximal femoral vein, and mid femoral vein are compressible. The distal femoral vein is noncompressible and contains occlusive thrombus. There is also occlusive thrombus throughout the popliteal vein. There is nonocclusive thrombus in the posterior tibial and peroneal veins. There is also superficial thrombus in the left lesser saphenous vein. Doppler analysis in the common femoral and femoral vein demonstrates  phasicity. Augmentation was deferred. 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 There is nonocclusive thrombus within deep calf veins. Electronically Signed   By: Marybelle Killings M.D.   On: 02/06/2018 16:36    ASSESSMENT: Clinical stage Ib pancreatic adenocarcinoma.  PLAN:    1.  Clinical stage Ib pancreatic adenocarcinoma: EUS completed on October 25, 2017 confirmed adenocarcinoma.   Patient's CA 19-9 is essentially unchanged at 81. PET scan results reviewed independently confirming no other disease outside the pancreas.  After review at cancer conference and with radiology, it appears the lesion is abutting the SMA, therefore patient will require neoadjuvant chemotherapy.  She was evaluated by surgery and is considered borderline resectable.  Repeat CT scan in February 06, 2018 reviewed independently and reported as above with essentially stable disease in her pancreas.  Neoadjuvant FOLFIRINOX was too toxic for patient, therefore she was switched to single agent gemcitabine.  Will consider adding Abraxane if her performance status improves.  Patient was given a referral to radiation  oncology in [place] for consideration of stereotactic XRT.  In the meantime, will proceed with cycle 3, day 1 of gemcitabine only.  Return to clinic in 1 week for further evaluation and consideration of cycle 3, day 8.    2.  Anemia: Patient's hemoglobin decreased, but stable at 8.9. 3.  Pain: Slightly worse today.  Patient no longer is taking fentanyl patch, but was instructed to continue oxycodone as needed. 4.  Renal mass: CT scan results as above.  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 No intervention is needed at this time.  Continue to monitor while patient is undergoing treatment for her pancreatic cancer.  Patient was previously given a referral to urology.   4.  Genetic: Genetic testing is pending at time of dictation. 5.  Chronic  renal insufficiency: Patient's creatinine is within normal limits. 6.  Hypokalemia: Potassium is 2.8 today.  Proceed with 40 mEq IV potassium.   7.  Diarrhea: Continue Lomotil as needed. 8.  Poor appetite/weight loss: Patient has declined dietary referral at this time. 9.  Left lower extremity DVT: Confirmed by ultrasound as above.  Continue Eliquis as prescribed.  Okay to hold Eliquis for 48 hours prior to EUS to place fiduciary's in preparation for stereotactic XRT.  Patient should reinitiate treatment within 24 hours of procedure.  Patient expressed understanding and was in agreement with this plan. She also understands that She can call clinic at any time with any questions, concerns, or complaints.   Cancer Staging Pancreatic adenocarcinoma [facility]) Staging form: Exocrine Pancreas, AJCC 8th Edition - Clinical stage from 10/26/2017: Stage IB (cT2, cN0, cM0) - Signed by Lloyd Huger, MD on 10/26/2017   Lloyd Huger, MD   02/20/2018 9:17 AM

## 2018-02-19 ENCOUNTER — Inpatient Hospital Stay (HOSPITAL_BASED_OUTPATIENT_CLINIC_OR_DEPARTMENT_OTHER): Payer: Medicare Other | Admitting: Hospice and Palliative Medicine

## 2018-02-19 ENCOUNTER — Inpatient Hospital Stay: Payer: Medicare Other

## 2018-02-19 ENCOUNTER — Encounter: Payer: Self-pay | Admitting: Oncology

## 2018-02-19 ENCOUNTER — Inpatient Hospital Stay (HOSPITAL_BASED_OUTPATIENT_CLINIC_OR_DEPARTMENT_OTHER): Payer: Medicare Other | Admitting: Oncology

## 2018-02-19 VITALS — BP 147/73 | HR 95 | Temp 97.8°F | Resp 18 | Ht 63.0 in | Wt 146.4 lb

## 2018-02-19 DIAGNOSIS — C259 Malignant neoplasm of pancreas, unspecified: Secondary | ICD-10-CM

## 2018-02-19 DIAGNOSIS — D649 Anemia, unspecified: Secondary | ICD-10-CM | POA: Diagnosis not present

## 2018-02-19 DIAGNOSIS — R52 Pain, unspecified: Secondary | ICD-10-CM

## 2018-02-19 DIAGNOSIS — R5382 Chronic fatigue, unspecified: Secondary | ICD-10-CM

## 2018-02-19 DIAGNOSIS — R530 Neoplastic (malignant) related fatigue: Secondary | ICD-10-CM

## 2018-02-19 DIAGNOSIS — Z515 Encounter for palliative care: Secondary | ICD-10-CM | POA: Diagnosis not present

## 2018-02-19 DIAGNOSIS — R5383 Other fatigue: Secondary | ICD-10-CM

## 2018-02-19 DIAGNOSIS — I824Z2 Acute embolism and thrombosis of unspecified deep veins of left distal lower extremity: Secondary | ICD-10-CM

## 2018-02-19 DIAGNOSIS — R531 Weakness: Secondary | ICD-10-CM | POA: Diagnosis not present

## 2018-02-19 DIAGNOSIS — R634 Abnormal weight loss: Secondary | ICD-10-CM

## 2018-02-19 DIAGNOSIS — Z5111 Encounter for antineoplastic chemotherapy: Secondary | ICD-10-CM | POA: Diagnosis not present

## 2018-02-19 DIAGNOSIS — E876 Hypokalemia: Secondary | ICD-10-CM

## 2018-02-19 DIAGNOSIS — N189 Chronic kidney disease, unspecified: Secondary | ICD-10-CM | POA: Diagnosis not present

## 2018-02-19 DIAGNOSIS — N2889 Other specified disorders of kidney and ureter: Secondary | ICD-10-CM | POA: Diagnosis not present

## 2018-02-19 DIAGNOSIS — R630 Anorexia: Secondary | ICD-10-CM

## 2018-02-19 LAB — COMPREHENSIVE METABOLIC PANEL
ALT: 24 U/L (ref 0–44)
AST: 34 U/L (ref 15–41)
Albumin: 3 g/dL — ABNORMAL LOW (ref 3.5–5.0)
Alkaline Phosphatase: 96 U/L (ref 38–126)
Anion gap: 10 (ref 5–15)
BUN: 9 mg/dL (ref 8–23)
CO2: 21 mmol/L — AB (ref 22–32)
Calcium: 9 mg/dL (ref 8.9–10.3)
Chloride: 111 mmol/L (ref 98–111)
Creatinine, Ser: 0.99 mg/dL (ref 0.44–1.00)
GFR calc Af Amer: >60 mL/min (ref >60)
GFR calc non Af Amer: 59 mL/min — ABNORMAL LOW (ref >60)
Glucose, Bld: 143 mg/dL — ABNORMAL HIGH (ref 70–99)
Potassium: 2.8 mmol/L — ABNORMAL LOW (ref 3.5–5.1)
SODIUM: 142 mmol/L (ref 135–145)
Total Bilirubin: 0.7 mg/dL (ref 0.3–1.2)
Total Protein: 6.8 g/dL (ref 6.5–8.1)

## 2018-02-19 LAB — CBC WITH DIFFERENTIAL/PLATELET
Abs Immature Granulocytes: 0.13 10*3/uL — ABNORMAL HIGH (ref 0.00–0.07)
Basophils Absolute: 0.1 10*3/uL (ref 0.0–0.1)
Basophils Relative: 1 %
Eosinophils Absolute: 0.2 10*3/uL (ref 0.0–0.5)
Eosinophils Relative: 2 %
HCT: 27.1 % — ABNORMAL LOW (ref 36.0–46.0)
Hemoglobin: 8.9 g/dL — ABNORMAL LOW (ref 12.0–15.0)
IMMATURE GRANULOCYTES: 1 %
Lymphocytes Relative: 37 %
Lymphs Abs: 3.6 10*3/uL (ref 0.7–4.0)
MCH: 33.2 pg (ref 26.0–34.0)
MCHC: 32.8 g/dL (ref 30.0–36.0)
MCV: 101.1 fL — ABNORMAL HIGH (ref 80.0–100.0)
Monocytes Absolute: 1 10*3/uL (ref 0.1–1.0)
Monocytes Relative: 10 %
Neutro Abs: 4.8 10*3/uL (ref 1.7–7.7)
Neutrophils Relative %: 49 %
Platelets: 490 10*3/uL — ABNORMAL HIGH (ref 150–400)
RBC: 2.68 MIL/uL — AB (ref 3.87–5.11)
RDW: 24.7 % — ABNORMAL HIGH (ref 11.5–15.5)
WBC: 9.7 10*3/uL (ref 4.0–10.5)
nRBC: 0.8 % — ABNORMAL HIGH (ref 0.0–0.2)

## 2018-02-19 MED ORDER — SODIUM CHLORIDE 0.9 % IV SOLN
Freq: Once | INTRAVENOUS | Status: AC
  Administered 2018-02-19: 11:00:00 via INTRAVENOUS
  Filled 2018-02-19: qty 250

## 2018-02-19 MED ORDER — SODIUM CHLORIDE 0.9 % IV SOLN
40.0000 meq | Freq: Once | INTRAVENOUS | Status: AC
  Administered 2018-02-19: 40 meq via INTRAVENOUS
  Filled 2018-02-19: qty 20

## 2018-02-19 MED ORDER — HEPARIN SOD (PORK) LOCK FLUSH 100 UNIT/ML IV SOLN
500.0000 [IU] | Freq: Once | INTRAVENOUS | Status: AC
  Administered 2018-02-19: 500 [IU] via INTRAVENOUS

## 2018-02-19 MED ORDER — HEPARIN SOD (PORK) LOCK FLUSH 100 UNIT/ML IV SOLN
500.0000 [IU] | Freq: Once | INTRAVENOUS | Status: DC | PRN
  Filled 2018-02-19: qty 5

## 2018-02-19 MED ORDER — PROCHLORPERAZINE MALEATE 10 MG PO TABS
10.0000 mg | ORAL_TABLET | Freq: Once | ORAL | Status: AC
  Administered 2018-02-19: 10 mg via ORAL
  Filled 2018-02-19: qty 1

## 2018-02-19 MED ORDER — SODIUM CHLORIDE 0.9% FLUSH
10.0000 mL | INTRAVENOUS | Status: DC | PRN
  Administered 2018-02-19: 10 mL via INTRAVENOUS
  Filled 2018-02-19: qty 10

## 2018-02-19 MED ORDER — SODIUM CHLORIDE 0.9 % IV SOLN: 1800.0000 mg | Freq: Once | INTRAVENOUS | Status: DC

## 2018-02-19 MED ORDER — SODIUM CHLORIDE 0.9 % IV SOLN
1800.0000 mg | Freq: Once | INTRAVENOUS | Status: AC
  Administered 2018-02-19: 1800 mg via INTRAVENOUS
  Filled 2018-02-19: qty 21.04

## 2018-02-19 NOTE — Progress Notes (Signed)
Referrals sent to [facility] to Dr. Rush Landmark and radiation oncology at [facility] for SBRT.

## 2018-02-19 NOTE — Progress Notes (Signed)
[place]  [phone] [phone]   Name: Cindy Bowman Date: 02/19/2018 MRN: [id number]  DOB: 1950-06-15  Patient Care Team: Glendon Axe, MD as PCP - General (Internal Medicine) Clent Jacks, RN as Registered Nurse Grayland Ormond Kathlene November, MD as Medical Oncologist (Medical Oncology)    REASON FOR CONSULTATION: Palliative Care consult requested for this 67 y.o. female with multiple medical problems including stage Ib adenocarcinoma of the pancreas (diagnosed 09/2017) currently receiving neoadjuvant FOLFIRINOX plan for possible stereotactic XRT prior to resection.  Patient was recently hospitalized 11/16/2017 to 11/18/2017 with intractable nausea and vomiting secondary to chemotherapy, which improved with aggressive use of antiemetics.  Patient has been referred to palliative care to help with symptom management and medical goals.  SOCIAL HISTORY:    Patient is divorced.  She has one son with whom she lives.  Patient moved here from [place].  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  ADVANCE DIRECTIVES:  Patient says her son is her healthcare power of attorney.  Patient says she has a living will.  CODE STATUS: Full code  PAST MEDICAL HISTORY: Past Medical History:  Diagnosis Date  . Essential hypertension   . Pancreatic cancer ([place])   . UTI (urinary tract infection)     PAST SURGICAL HISTORY:  Past Surgical History:  Procedure Laterality Date  . CESAREAN SECTION    . CHOLECYSTECTOMY    . EUS N/A 10/25/2017   Procedure: FULL UPPER ENDOSCOPIC ULTRASOUND (EUS) RADIAL;  Surgeon: Jola Schmidt, MD;  Location: [facility] ENDOSCOPY;  Service: Endoscopy;  Laterality: N/A;  . OOPHORECTOMY    . PORTA CATH INSERTION N/A 11/12/2017   Procedure: PORTA CATH INSERTION;  Surgeon: Algernon Huxley, MD;  Location: [place] [facility];  Service: Cardiovascular;  Laterality: N/A;  . TUBAL LIGATION      HEMATOLOGY/ONCOLOGY HISTORY:      Pancreatic adenocarcinoma ([place])   10/18/2017 Initial Diagnosis    Pancreatic adenocarcinoma ([place])    10/26/2017 Cancer Staging    Staging form: Exocrine Pancreas, AJCC 8th Edition - Clinical stage from 10/26/2017: Stage IB (cT2, cN0, cM0) - Signed by Lloyd Huger, MD on 10/26/2017    11/06/2017 - 12/11/2017 Chemotherapy    The patient had palonosetron (ALOXI) injection 0.25 mg, 0.25 mg, Intravenous,  Once, 2 of 4 cycles Administration: 0.25 mg (11/14/2017), 0.25 mg (11/28/2017) irinotecan (CAMPTOSAR) 340 mg in dextrose 5 % 500 mL chemo infusion, 180 mg/m2 = 340 mg, Intravenous,  Once, 2 of 4 cycles Dose modification: 160 mg/m2 (original dose 180 mg/m2, Cycle 2, Reason: Dose not tolerated) Administration: 340 mg (11/14/2017), 300 mg (11/28/2017) leucovorin 750 mg in dextrose 5 % 250 mL infusion, 760 mg, Intravenous,  Once, 2 of 4 cycles Dose modification: 360 mg/m2 (original dose 400 mg/m2, Cycle 2, Reason: Dose not tolerated) Administration: 750 mg (11/14/2017), 700 mg (11/28/2017) oxaliplatin (ELOXATIN) 150 mg in dextrose 5 % 500 mL chemo infusion, 160 mg, Intravenous,  Once, 2 of 4 cycles Dose modification: 70 mg/m2 (original dose 85 mg/m2, Cycle 2, Reason: Dose not tolerated) Administration: 150 mg (11/14/2017), 135 mg (11/28/2017) fluorouracil (ADRUCIL) chemo injection 750 mg, 400 mg/m2 = 750 mg, Intravenous,  Once, 2 of 4 cycles Dose modification: 360 mg/m2 (original dose 400 mg/m2, Cycle 2, Reason: Dose not tolerated) Administration: 750 mg (11/14/2017), 700 mg (11/28/2017) fluorouracil (ADRUCIL) 4,550 mg in sodium chloride 0.9 % 59 mL chemo infusion, 2,400 mg/m2 = 4,550 mg, Intravenous, 1 Day/Dose, 2 of 4 cycles  Dose modification: 2,000 mg/m2 (original dose 2,400 mg/m2, Cycle 2, Reason: Dose not tolerated) Administration: 4,550 mg (11/14/2017), 3,800 mg (11/28/2017)  for chemotherapy treatment.     12/26/2017 -  Chemotherapy    The patient had gemcitabine (GEMZAR) 1,800 mg in sodium chloride  0.9 % 250 mL chemo infusion, 1,786 mg, Intravenous,  Once, 3 of 4 cycles Administration: 1,800 mg (12/26/2017), 1,800 mg (01/02/2018), 1,800 mg (01/09/2018), 1,800 mg (01/23/2018), 1,800 mg (01/30/2018)  for chemotherapy treatment.      ALLERGIES:  is allergic to nsaids; ibuprofen; and penicillins.  MEDICATIONS:  Current Outpatient Medications  Medication Sig Dispense Refill  . diphenoxylate-atropine (LOMOTIL) 2.5-0.025 MG tablet Take 1 tablet by mouth 4 (four) times daily as needed for diarrhea or loose stools. (Patient not taking: Reported on 02/19/2018) 30 tablet 0  . docusate sodium (COLACE) 100 MG capsule Take 2 capsules (200 mg total) by mouth 2 (two) times daily. (Patient not taking: Reported on 02/19/2018) 30 capsule 0  . ELIQUIS STARTER PACK (ELIQUIS STARTER PACK) 5 MG TABS Take as directed on package: start with two-5mg  tablets twice daily for 7 days. On day 8, switch to one-5mg  tablet twice daily. 1 each 0  . fentaNYL (DURAGESIC - DOSED MCG/HR) 25 MCG/HR patch Place 1 patch (25 mcg total) onto the skin every 3 (three) days. (Patient not taking: Reported on 02/19/2018) 10 patch 0  . loperamide (IMODIUM) 2 MG capsule Take by mouth as needed for diarrhea or loose stools.    Marland Kitchen losartan (COZAAR) 50 MG tablet Take 1 tablet (50 mg total) by mouth daily. 30 tablet 0  . ondansetron (ZOFRAN ODT) 4 MG disintegrating tablet Take 1 tablet (4 mg total) by mouth every 8 (eight) hours as needed for nausea or vomiting. (Patient not taking: Reported on 02/19/2018) 20 tablet 0  . Oxycodone HCl 10 MG TABS Take 1 tablet (10 mg total) by mouth every 6 (six) hours as needed. 60 tablet 0  . potassium chloride SA (KLOR-CON M20) 20 MEQ tablet Take 1 tablet (20 mEq total) by mouth daily. (Patient not taking: Reported on 02/19/2018) 90 tablet 1  . promethazine (PHENERGAN) 12.5 MG tablet Take 1 tablet (12.5 mg total) by mouth 3 (three) times daily before meals. (Patient not taking: Reported on 02/19/2018) 30 tablet  0   No current facility-administered medications for this visit.    Facility-Administered Medications Ordered in Other Visits  Medication Dose Route Frequency Provider Last Rate Last Dose  . gemcitabine (GEMZAR) 1,800 mg in sodium chloride 0.9 % 250 mL chemo infusion  1,800 mg Intravenous Once Lloyd Huger, MD      . heparin lock flush 100 unit/mL  500 Units Intravenous Once Lloyd Huger, MD      . heparin lock flush 100 unit/mL  500 Units Intracatheter Once PRN Lloyd Huger, MD      . potassium chloride 40 mEq in sodium chloride 0.9 % 270 mL (0.1481 mEq/mL) infusion  40 mEq Intravenous Once Lloyd Huger, MD 135 mL/hr at 02/19/18 1040 40 mEq at 02/19/18 1040  . prochlorperazine (COMPAZINE) injection 10 mg  10 mg Intravenous Once Rexene Agent      . sodium chloride flush (NS) 0.9 % injection 10 mL  10 mL Intravenous PRN Lloyd Huger, MD   10 mL at 02/19/18 0855    VITAL SIGNS: There were no vitals taken for this visit. There were no vitals filed for this visit.  Estimated body mass index is 25.93  kg/m as calculated from the following:   Height as of an earlier encounter on 02/19/18: 5\' 3"  (1.6 m).   Weight as of an earlier encounter on 02/19/18: 146 lb 6.4 oz (66.4 kg).  LABS: CBC:    Component Value Date/Time   WBC 9.7 02/19/2018 0849   HGB 8.9 (L) 02/19/2018 0849   HCT 27.1 (L) 02/19/2018 0849   PLT 490 (H) 02/19/2018 0849   MCV 101.1 (H) 02/19/2018 0849   NEUTROABS 4.8 02/19/2018 0849   LYMPHSABS 3.6 02/19/2018 0849   MONOABS 1.0 02/19/2018 0849   EOSABS 0.2 02/19/2018 0849   BASOSABS 0.1 02/19/2018 0849   Comprehensive Metabolic Panel:    Component Value Date/Time   NA 142 02/19/2018 0849   K 2.8 (L) 02/19/2018 0849   CL 111 02/19/2018 0849   CO2 21 (L) 02/19/2018 0849   BUN 9 02/19/2018 0849   CREATININE 0.99 02/19/2018 0849   GLUCOSE 143 (H) 02/19/2018 0849   CALCIUM 9.0 02/19/2018 0849   AST 34 02/19/2018 0849   ALT 24  02/19/2018 0849   ALKPHOS 96 02/19/2018 0849   BILITOT 0.7 02/19/2018 0849   PROT 6.8 02/19/2018 0849   ALBUMIN 3.0 (L) 02/19/2018 0849    RADIOGRAPHIC STUDIES: Ct Abdomen Pelvis W Contrast  Result Date: 02/15/2018 CLINICAL DATA:  67 year old female for follow-up/restaging of pancreatic malignancy. Currently undergoing chemotherapy. EXAM: CT ABDOMEN AND PELVIS WITH CONTRAST TECHNIQUE: Multidetector CT imaging of the abdomen and pelvis was performed using the standard protocol following bolus administration of intravenous contrast. CONTRAST:  159mL ISOVUE-300 IOPAMIDOL (ISOVUE-300) INJECTION 61% COMPARISON:  12/12/2017 CT and prior studies FINDINGS: Lower chest: No acute or new abnormalities. Hepatobiliary: Hepatic steatosis again noted. No focal hepatic lesions are present. Patient is status post cholecystectomy. No biliary dilatation. Pancreas: Pancreatic uncinate process mass is unchanged in size measuring 2.5 x 2.7 cm (series 2: Image 26). Contact with the SMA and SMV again noted. The remainder of the pancreas is unremarkable. Spleen: Unremarkable Adrenals/Urinary Tract: A 3 cm enhancing solid and cystic mass involving the LATERAL LEFT mid kidney is unchanged. There is no evidence of hydronephrosis or new renal abnormality. The adrenal glands are unremarkable. Bladder unremarkable with known bladder diverticulum difficult to visualized on this exam. Stomach/Bowel: No bowel wall thickening, bowel obstruction or inflammatory changes. Colonic diverticulosis noted without evidence of diverticulitis. Vascular/Lymphatic: No significant vascular findings are present. No enlarged abdominal or pelvic lymph nodes. Reproductive: Uterus and bilateral adnexa are unremarkable. Other: No ascites or peritoneal/omental abnormality. A moderate supraumbilical ventral hernia containing fat is unchanged. No pneumoperitoneum or focal collection/abscess. Musculoskeletal: No acute or suspicious bony abnormalities. IMPRESSION:  1. No significant change in 2.7 cm pancreatic uncinate process mass since 12/12/2017. No evidence of distant metastases. 2. Unchanged 3 cm LEFT renal mass highly suspicious for renal cell carcinoma. 3. No new or significant changes from the prior study. Electronically Signed   By: Margarette Canada M.D.   On: 02/15/2018 09:45   US Venous Img Lower Unilateral Left  Result Date: 02/06/2018 CLINICAL DATA:  Left lower extremity pain.  Pancreatic cancer. EXAM: LEFT LOWER EXTREMITY VENOUS DUPLEX ULTRASOUND TECHNIQUE: Doppler venous assessment of the left lower extremity deep venous system was performed, including characterization of spectral flow, compressibility, and phasicity. COMPARISON:  None. FINDINGS: The common femoral vein, proximal femoral vein, and mid femoral vein are compressible. The distal femoral vein is noncompressible and contains occlusive thrombus. There is also occlusive thrombus throughout the popliteal vein. There is nonocclusive thrombus in  the posterior tibial and peroneal veins. There is also superficial thrombus in the left lesser saphenous vein. Doppler analysis in the common femoral and femoral vein demonstrates phasicity. Augmentation was deferred. 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 There is nonocclusive thrombus within deep calf veins. Electronically Signed   By: Marybelle Killings M.D.   On: 02/06/2018 16:36    PERFORMANCE STATUS (ECOG) : 1 - Symptomatic but completely ambulatory  Review of Systems As noted above. Otherwise, a complete review of systems is negative.  Physical Exam General: NAD, sitting in chair Cardiovascular: regular rate and rhythm Pulmonary: clear ant fields Abdomen: soft, nontender, + bowel sounds Extremities: no edema Skin: no rashes Neurological: Weakness but otherwise nonfocal  IMPRESSION: Patient returns to the clinic today for follow up. She reports intermittently poor  oral intake associated with consistency of food. She denies problems swallowing or oral/throat pain. No nausea or vomiting. She is being followed by our dietician.  Weight trends are being followed. Patient noted to have a ~10lbs weight loss over past month down to current weight of 146lbs. Discussed importance of high calorie, high protein meals. Recommended increased oral supplements. Consideration could also be given for an appetite stimulant.   Patient also has some fatigue. She reports sleeping well at night but lacks the "pick me up" during the day. She associates some of this to her oral intake. Stimulants could be considered for fatigue but I would be concerned that these could exacerbate her weight loss.   She reports coping well with her illness. She denies depression.   PLAN: 1.  Continue supportive care 2.  RTC in 1 month or sooner if needed   Patient expressed understanding and was in agreement with this plan. She also understands that She can call clinic at any time with any questions, concerns, or complaints.    Time Total: 20 minutes  Visit consisted of counseling and education dealing with the complex and emotionally intense issues of symptom management and palliative care in the setting of serious and potentially life-threatening illness.Greater than 50%  of this time was spent counseling and coordinating care related to the above assessment and plan.  Signed by: Altha Harm, [place], NP-C, [place] (Work Cell)

## 2018-02-21 ENCOUNTER — Telehealth: Payer: Self-pay

## 2018-02-21 ENCOUNTER — Ambulatory Visit: Payer: Medicare Other | Admitting: Hospice and Palliative Medicine

## 2018-02-21 NOTE — Telephone Encounter (Signed)
Your first available date is 2/3 at [facility]  Dr Ardis Hughs has nothing any sooner.  He is booked thru January.

## 2018-02-21 NOTE — Telephone Encounter (Signed)
-----   Message from Irving Copas., MD sent at 02/19/2018  2:03 PM EST ----- Regarding: RE: SBRT for pancreas Jinelle Butchko,Reasonable to proceed with scheduling an endoscopic ultrasound with fiducial placement.Please let me know when my next available is since I will be away in the next couple of weeks.I will put Linna Hoff on here as well in case he has an availability and wants to proceed with this case otherwise happy when I am available.Thanks.GM ----- Message ----- From: Timothy Lasso, RN Sent: 02/19/2018  10:31 AM EST To: Irving Copas., MD Subject: Melton Alar: SBRT for pancreas                          Please review  ----- Message ----- From: Clent Jacks, RN Sent: 02/19/2018  10:26 AM EST To: Timothy Lasso, RN Subject: RE: SBRT for pancreas                          Yes, to have the  fiducial markers placed. Thanks Judith Campillo. ----- Message ----- From: Timothy Lasso, RN Sent: 02/19/2018  10:09 AM EST To: Clent Jacks, RN Subject: RE: SBRT for pancreas                          Does this pt need an EUS?? ----- Message ----- From: Clent Jacks, RN Sent: 02/19/2018   9:52 AM EST To: Stark Klein, MD, Lloyd Huger, MD, # Subject: SBRT for pancreas                              Good morning, I would like to arrange for this patient to receive SBRT for her pancreatic cancer. I have entered the referrals to [facility] and radiation oncology. Thank you  Kristi

## 2018-02-22 ENCOUNTER — Telehealth: Payer: Self-pay

## 2018-02-22 NOTE — Telephone Encounter (Signed)
Yes please

## 2018-02-22 NOTE — Telephone Encounter (Signed)
I have talked with son, [name]. He will be able to take her to [facility] to have fiducial markers placed in preparation for SBRT. I will send her information to [place]. They have opening the week of January 13th. Dr. Grayland Ormond she will likely need to hold her Eliquis for 48 hours prior to procedure. Can you document permission to hold and I will send them the permission along with her records.  Oncology Nurse Navigator Documentation  Navigator Location: [facility] (02/22/18 1100)   )Navigator Encounter Type: Telephone (02/22/18 1100) Telephone: [place] Call (02/22/18 1100)                       Barriers/Navigation Needs: Coordination of Care (02/22/18 1100)   Interventions: Coordination of Care (02/22/18 1100)                      Time Spent with Patient: 30 (02/22/18 1100)

## 2018-02-22 NOTE — Telephone Encounter (Signed)
I can get her in week of January 13th at [facility]. Let me know if you would like to proceed.

## 2018-02-22 NOTE — Telephone Encounter (Signed)
Thank you for helping. I have just confirmed with her son that he can take her to [facility].

## 2018-02-22 NOTE — Telephone Encounter (Signed)
[facility] is has no available time on 1/22.  Will schedule for 2/3 at [facility].  Steffanie Dunn this is the soonest either of our docs have time for the case.  Do you want me to get her scheduled for 2/3?

## 2018-02-22 NOTE — Progress Notes (Signed)
Cindy Bowman  [phone] [phone]  ID: Cindy Bowman OB: 15-Dec-1950  MR#: [id number]  OIZ#:[id number]  Patient Care Team: Glendon Axe, MD as PCP - General (Internal Medicine) Clent Jacks, RN as Registered Nurse Lloyd Huger, MD as Medical Oncologist (Medical Oncology)  CHIEF COMPLAINT: Clinical stage Ib pancreatic adenocarcinoma.  INTERVAL HISTORY: Patient returns to clinic today for further evaluation and consideration of cycle 3, day 8 of single agent gemcitabine.  She currently has increased congestion, sinus pain, and productive cough.  She denies any fevers. She continues to have chronic weakness and fatigue.  She does not complain of pain in her calf today.  She continues to have a poor appetite.  She has no neurologic complaints.  She denies any chest pain or shortness of breath. She has no nausea, vomiting, constipation, or diarrhea.  She has no urinary complaints.  Patient offers no further specific complaints today.  REVIEW OF SYSTEMS:   Review of Systems  Constitutional: Positive for malaise/fatigue and weight loss. Negative for fever.  HENT: Positive for congestion and sinus pain.   Respiratory: Positive for cough. Negative for shortness of breath.   Cardiovascular: Negative for chest pain and leg swelling.  Gastrointestinal: Negative for abdominal pain, blood in stool, diarrhea, melena, nausea and vomiting.  Genitourinary: Negative.  Negative for dysuria.  Musculoskeletal: Negative.  Negative for back pain.  Skin: Negative.  Negative for rash.  Neurological: Positive for weakness. Negative for dizziness, focal weakness and headaches.  Psychiatric/Behavioral: Negative.  The patient is not nervous/anxious.     As per HPI. Otherwise, a complete review of systems is negative.  PAST MEDICAL HISTORY: Past Medical History:  Diagnosis Date  . Essential hypertension   . Pancreatic cancer ([place])   . UTI (urinary tract  infection)     PAST SURGICAL HISTORY: Past Surgical History:  Procedure Laterality Date  . CESAREAN SECTION    . CHOLECYSTECTOMY    . EUS N/A 10/25/2017   Procedure: FULL UPPER ENDOSCOPIC ULTRASOUND (EUS) RADIAL;  Surgeon: Jola Schmidt, MD;  Location: [facility] ENDOSCOPY;  Service: Endoscopy;  Laterality: N/A;  . OOPHORECTOMY    . PORTA CATH INSERTION N/A 11/12/2017   Procedure: PORTA CATH INSERTION;  Surgeon: Algernon Huxley, MD;  Location: [place] [facility];  Service: Cardiovascular;  Laterality: N/A;  . TUBAL LIGATION      FAMILY HISTORY: Family History  Problem Relation Age of Onset  . Hypertension Mother        deceased 49  . Heart attack Father        deceased late 54s    ADVANCED DIRECTIVES (Y/N):  N  HEALTH MAINTENANCE: Social History   Tobacco Use  . Smoking status: Never Smoker  . Smokeless tobacco: Never Used  Substance Use Topics  . Alcohol use: Not Currently  . Drug use: Never     Colonoscopy:  PAP:  Bone density:  Lipid panel:  Allergies  Allergen Reactions  . Nsaids Other (See Comments)    Decreased GFR  . Ibuprofen     Lowers kidney function  . Penicillins     Yeast infection  Has patient had a PCN reaction causing immediate rash, facial/tongue/throat swelling, SOB or lightheadedness with hypotension: No Has patient had a PCN reaction causing severe rash involving mucus membranes or skin necrosis: No Has patient had a PCN reaction that required hospitalization: No Has patient had a PCN reaction occurring within the last 10 years: No If all of the above  answers are "NO", then may proceed with Cephalosporin use.     Current Outpatient Medications  Medication Sig Dispense Refill  . diphenoxylate-atropine (LOMOTIL) 2.5-0.025 MG tablet Take 1 tablet by mouth 4 (four) times daily as needed for diarrhea or loose stools. 30 tablet 0  . docusate sodium (COLACE) 100 MG capsule Take 2 capsules (200 mg total) by mouth 2 (two) times daily. 30 capsule 0  .  fentaNYL (DURAGESIC - DOSED MCG/HR) 25 MCG/HR patch Place 1 patch (25 mcg total) onto the skin every 3 (three) days. (Patient not taking: Reported on 02/27/2018) 10 patch 0  . loperamide (IMODIUM) 2 MG capsule Take by mouth as needed for diarrhea or loose stools.    Marland Kitchen losartan (COZAAR) 50 MG tablet Take 1 tablet (50 mg total) by mouth daily. 30 tablet 0  . ondansetron (ZOFRAN ODT) 4 MG disintegrating tablet Take 1 tablet (4 mg total) by mouth every 8 (eight) hours as needed for nausea or vomiting. 20 tablet 0  . Oxycodone HCl 10 MG TABS Take 1 tablet (10 mg total) by mouth every 6 (six) hours as needed. 60 tablet 0  . potassium chloride SA (KLOR-CON M20) 20 MEQ tablet Take 1 tablet (20 mEq total) by mouth daily. (Patient not taking: Reported on 02/27/2018) 90 tablet 1  . apixaban (ELIQUIS) 5 MG TABS tablet Take 1 tablet (5 mg total) by mouth 2 (two) times daily. 60 tablet 3  . levofloxacin (LEVAQUIN) 500 MG tablet Take 1 tablet (500 mg total) by mouth daily. 7 tablet 0   No current facility-administered medications for this visit.    Facility-Administered Medications Ordered in Other Visits  Medication Dose Route Frequency Provider Last Rate Last Dose  . prochlorperazine (COMPAZINE) injection 10 mg  10 mg Intravenous Once Rexene Agent        OBJECTIVE: Vitals:   02/26/18 0940  BP: (!) 142/73  Pulse: (!) 110  Temp: (!) 97.3 F (36.3 C)     There is no height or weight on file to calculate BMI.    ECOG FS:0 - Asymptomatic  General: Well-developed, well-nourished, no acute distress.  Sitting in a wheelchair. Eyes: Pink conjunctiva, anicteric sclera. HEENT: Normocephalic, moist mucous membranes, clear oropharnyx. Lungs: Clear to auscultation bilaterally. Heart: Regular rate and rhythm. No rubs, murmurs, or gallops. Abdomen: Soft, nontender, nondistended. No organomegaly noted, normoactive bowel sounds. Musculoskeletal: No edema, cyanosis, or clubbing. Neuro: Alert, answering all questions  appropriately. Cranial nerves grossly intact. Skin: No rashes or petechiae noted. Psych: Normal affect.  LAB RESULTS:  Lab Results  Component Value Date   NA 140 02/26/2018   K 3.2 (L) 02/26/2018   CL 109 02/26/2018   CO2 22 02/26/2018   GLUCOSE 142 (H) 02/26/2018   BUN 9 02/26/2018   CREATININE 1.11 (H) 02/26/2018   CALCIUM 8.9 02/26/2018   PROT 7.2 02/26/2018   ALBUMIN 3.1 (L) 02/26/2018   AST 47 (H) 02/26/2018   ALT 31 02/26/2018   ALKPHOS 107 02/26/2018   BILITOT 0.7 02/26/2018   GFRNONAA 51 (L) 02/26/2018   GFRAA 59 (L) 02/26/2018    Lab Results  Component Value Date   WBC 9.3 02/26/2018   NEUTROABS 4.3 02/26/2018   HGB 8.6 (L) 02/26/2018   HCT 26.2 (L) 02/26/2018   MCV 103.1 (H) 02/26/2018   PLT 213 02/26/2018     STUDIES: Ct Abdomen Pelvis W Contrast  Result Date: 02/15/2018 CLINICAL DATA:  68 year old female for follow-up/restaging of pancreatic malignancy. Currently undergoing chemotherapy. EXAM:  CT ABDOMEN AND PELVIS WITH CONTRAST TECHNIQUE: Multidetector CT imaging of the abdomen and pelvis was performed using the standard protocol following bolus administration of intravenous contrast. CONTRAST:  126mL ISOVUE-300 IOPAMIDOL (ISOVUE-300) INJECTION 61% COMPARISON:  12/12/2017 CT and prior studies FINDINGS: Lower chest: No acute or new abnormalities. Hepatobiliary: Hepatic steatosis again noted. No focal hepatic lesions are present. Patient is status post cholecystectomy. No biliary dilatation. Pancreas: Pancreatic uncinate process mass is unchanged in size measuring 2.5 x 2.7 cm (series 2: Image 26). Contact with the SMA and SMV again noted. The remainder of the pancreas is unremarkable. Spleen: Unremarkable Adrenals/Urinary Tract: A 3 cm enhancing solid and cystic mass involving the LATERAL LEFT mid kidney is unchanged. There is no evidence of hydronephrosis or new renal abnormality. The adrenal glands are unremarkable. Bladder unremarkable with known bladder  diverticulum difficult to visualized on this exam. Stomach/Bowel: No bowel wall thickening, bowel obstruction or inflammatory changes. Colonic diverticulosis noted without evidence of diverticulitis. Vascular/Lymphatic: No significant vascular findings are present. No enlarged abdominal or pelvic lymph nodes. Reproductive: Uterus and bilateral adnexa are unremarkable. Other: No ascites or peritoneal/omental abnormality. A moderate supraumbilical ventral hernia containing fat is unchanged. No pneumoperitoneum or focal collection/abscess. Musculoskeletal: No acute or suspicious bony abnormalities. IMPRESSION: 1. No significant change in 2.7 cm pancreatic uncinate process mass since 12/12/2017. No evidence of distant metastases. 2. Unchanged 3 cm LEFT renal mass highly suspicious for renal cell carcinoma. 3. No new or significant changes from the prior study. Electronically Signed   By: Margarette Canada M.D.   On: 02/15/2018 09:45   US Venous Img Lower Unilateral Left  Result Date: 02/06/2018 CLINICAL DATA:  Left lower extremity pain.  Pancreatic cancer. EXAM: LEFT LOWER EXTREMITY VENOUS DUPLEX ULTRASOUND TECHNIQUE: Doppler venous assessment of the left lower extremity deep venous system was performed, including characterization of spectral flow, compressibility, and phasicity. COMPARISON:  None. FINDINGS: The common femoral vein, proximal femoral vein, and mid femoral vein are compressible. The distal femoral vein is noncompressible and contains occlusive thrombus. There is also occlusive thrombus throughout the popliteal vein. There is nonocclusive thrombus in the posterior tibial and peroneal veins. There is also superficial thrombus in the left lesser saphenous vein. Doppler analysis in the common femoral and femoral vein demonstrates phasicity. Augmentation was deferred. 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 There is nonocclusive  thrombus within deep calf veins. Electronically Signed   By: Marybelle Killings M.D.   On: 02/06/2018 16:36    ASSESSMENT: Clinical stage Ib pancreatic adenocarcinoma.  PLAN:    1.  Clinical stage Ib pancreatic adenocarcinoma: EUS completed on October 25, 2017 confirmed adenocarcinoma.   Patient's CA 19-9 on January 09, 2018 reported at 26.  PET scan results from October 30, 2017 reviewed independently confirming no other disease outside the pancreas.  After review at cancer conference and with radiology, it appears the lesion is abutting the SMA, therefore patient will require neoadjuvant chemotherapy.  She was evaluated by surgery and is considered borderline resectable.  Repeat CT scan in February 06, 2018 reviewed independently and reported as above with essentially stable disease in her pancreas.  Neoadjuvant FOLFIRINOX was too toxic for patient, therefore she was switched to single agent gemcitabine.  Will consider adding Abraxane if her performance status improves.  Patient will have SBRT at [facility] in the next several weeks, therefore will temporarily discontinue gemcitabine.  Return to clinic in 2 weeks  for further evaluation.    2.  Anemia: Hemoglobin slowly trending down to 8.6.  Monitor. 3.  Pain: Continue oxycodone as needed. 4.  Renal mass: CT scan results as above.  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 No intervention is needed at this time.  Continue to monitor while patient is undergoing treatment for her pancreatic cancer.  Patient was previously given a referral to urology.   4.  Genetic: Genetic testing is pending at time of dictation. 5.  Chronic renal insufficiency: Patient creatinine has trended up to 1.11 today.  Proceed with 1 L IV fluids. 6.  Hypokalemia: Potassium improved to 3.2.  Proceed with 40 mEq IV potassium.   7.  Diarrhea: Patient does not complain of this today.  Continue Lomotil as needed. 8.  Poor appetite/weight loss: Patient has previously  declined dietary referral. 9.  Left lower extremity DVT: Confirmed by ultrasound as above.  Continue Eliquis as prescribed.  Okay to hold Eliquis for 48 hours prior to EUS to place fiduciary's in preparation for stereotactic XRT.  Patient should reinitiate treatment within 24 hours of procedure.  Patient expressed understanding and was in agreement with this plan. She also understands that She can call clinic at any time with any questions, concerns, or complaints.   Cancer Staging Pancreatic adenocarcinoma [facility]) Staging form: Exocrine Pancreas, AJCC 8th Edition - Clinical stage from 10/26/2017: Stage IB (cT2, cN0, cM0) - Signed by Lloyd Huger, MD on 10/26/2017   Lloyd Huger, MD   02/28/2018 6:53 AM

## 2018-02-22 NOTE — Telephone Encounter (Signed)
Error

## 2018-02-22 NOTE — Telephone Encounter (Signed)
12/31 note updated.

## 2018-02-22 NOTE — Telephone Encounter (Signed)
Please give patient first available for either of [place]. I looked through my other patients for the dates in January and it is difficult to find someone to move. On 1/22, if [facility] has time in the afternoon to continue my day, I would be willing to add-on during my administrative time if they are able to accomodate with their schedule. Thanks. GM

## 2018-02-22 NOTE — Telephone Encounter (Signed)
Kristi let me know if I can do anything else to help.

## 2018-02-22 NOTE — Telephone Encounter (Signed)
Thank you so much for your help Dr. Rush Landmark!  -Tim

## 2018-02-22 NOTE — Telephone Encounter (Signed)
That's probably ok, but can you check with [facility] to see if they have anything sooner?  Thanks!

## 2018-02-22 NOTE — Telephone Encounter (Signed)
I have spoken to Ms. Holle regarding having her fiducial markers placed at [facility]. We are unable to have them placed in [place] until February. [facility] has openings as early as the week of January 13th. Ms. Schecter in open to going to [facility]. With her transportation issues, she is going to have [name], son, call me to discuss further. I will also check with Dr. Lisbeth Renshaw to see if he prefers to see her before or after markers are placed. With her transportation issues I would not want her to be seen prematurely. She is currently scheduled to see Dr. Lisbeth Renshaw in consult 02/27/18.

## 2018-02-22 NOTE — Telephone Encounter (Signed)
Dr. Grayland Ormond, [facility] does not have anything available until 2/3. Is it okay for her to wait that long or would you like me to see if she can get transportation to [facility]. She has to provide her own transportation because of the sedation.

## 2018-02-22 NOTE — Telephone Encounter (Signed)
Thank you for update. Dr. Grayland Ormond and Steffanie Dunn, starting in February we are going to have a bit more availability as a result of expansion of my Hospital-based procedures being built in. We will be happy to assist with this patient or others moving forward so please let us know when you all may need [place] moving forward. Thank you for reaching out. Valarie Merino

## 2018-02-25 ENCOUNTER — Telehealth: Payer: Self-pay

## 2018-02-25 NOTE — Telephone Encounter (Signed)
error 

## 2018-02-25 NOTE — Progress Notes (Signed)
GI Location of Tumor / Histology:   Cindy Bowman presented   Cancer Staging Pancreatic adenocarcinoma [facility]) Staging form: Exocrine Pancreas, AJCC 8th Edition - Clinical stage from 10/26/2017: Stage IB (cT2, cN0, cM0) - Signed by Lloyd Huger, MD on 10/26/2017  Repeat CT 02/06/2018: No significant change in 2.7 cm pancreatic uncinate process mass since 12/12/2017. No evidence of distant metastases. 2. Unchanged 3 cm LEFT renal mass highly suspicious for renal cell carcinoma. 3. No new or significant changes from the prior study.   CT CAP 12/12/2017: Again identified is a mass within the pancreatic uncinate process. This is more cystic and better defined today. Measures on the order of 2.7 x 2.5 cm versus 3.0 x 2.8 cm on the prior.  Persistent contact with greater than 180 degrees of the SMA, including on image 24/2. No evidence of acute pancreatitis or duct Dilatation.  PET 9/10/2019The 3.0 x 3.0 cm pancreatic head mass is hypermetabolic with SUV max of 2.26. No adjacent lymphadenopathy. No findings for hepatic metastatic disease. The adrenal glands are unremarkable.  EUS 10/25/2017: Pancreatic adenocarcinoma.  Biopsies of   Past/Anticipated interventions by surgeon, if any:   Past/Anticipated interventions by medical oncology, if any:  Dr. Grayland Ormond 02/26/2018 - After review at cancer conference and with radiology, it appears the lesion is abutting the SMA, therefore patient will require neoadjuvant chemotherapy.   -She was evaluated by surgery and is considered borderline resectable.  - Neoadjuvant FOLFIRINOX was too toxic for patient, therefore she was switched to single agent gemcitabine.  Will consider adding Abraxane if her performance status improves.  -Patient was given a referral to radiation oncology in [facility] for consideration of stereotactic XRT. -Treatments will resume following procedure on Friday 03/01/2018. -Met with [name] in clinic. She has all of her upcoming  appointments. She will see anesthesia at [facility] for preop 1/13 and have her markers placed 1/17 with Dr. Cephas Darby. She was given instructions regarding her Eliquis hold and has full understanding. She sees Dr. Lisbeth Renshaw 1/8 in consult for SBRT. -Follow-up with Dr. Grayland Ormond 03/12/2018.  Weight changes, if any: 44 Pounds lost since April 2019.  Bowel/Bladder complaints, if any: Having some looseness in her bowels.  Nausea / Vomiting, if any: No  Pain issues, if any:  No  Any blood per rectum: No    BP (!) 147/63 (BP Location: Left Arm, Patient Position: Sitting)   Pulse 89   Temp 98.1 F (36.7 C) (Oral)   Resp 20   Ht '5\' 3"'  (1.6 m)   Wt 144 lb 6.4 oz (65.5 kg)   SpO2 100%   BMI 25.58 kg/m    Wt Readings from Last 3 Encounters:  02/27/18 144 lb 6.4 oz (65.5 kg)  02/19/18 146 lb 6.4 oz (66.4 kg)  01/30/18 149 lb 11.2 oz (67.9 kg)    SAFETY ISSUES:  Prior radiation? No   Pacemaker/ICD? No  Possible current pregnancy? Tubal Ligation  Is the patient on methotrexate? No  Current Complaints/Details:

## 2018-02-26 ENCOUNTER — Other Ambulatory Visit: Payer: Self-pay

## 2018-02-26 ENCOUNTER — Inpatient Hospital Stay (HOSPITAL_BASED_OUTPATIENT_CLINIC_OR_DEPARTMENT_OTHER): Payer: Medicare Other | Admitting: Oncology

## 2018-02-26 ENCOUNTER — Inpatient Hospital Stay: Payer: Medicare Other | Attending: Oncology

## 2018-02-26 ENCOUNTER — Inpatient Hospital Stay: Payer: Medicare Other

## 2018-02-26 VITALS — BP 142/73 | HR 110 | Temp 97.3°F

## 2018-02-26 DIAGNOSIS — D649 Anemia, unspecified: Secondary | ICD-10-CM | POA: Insufficient documentation

## 2018-02-26 DIAGNOSIS — Z79899 Other long term (current) drug therapy: Secondary | ICD-10-CM | POA: Insufficient documentation

## 2018-02-26 DIAGNOSIS — R52 Pain, unspecified: Secondary | ICD-10-CM | POA: Insufficient documentation

## 2018-02-26 DIAGNOSIS — C259 Malignant neoplasm of pancreas, unspecified: Secondary | ICD-10-CM

## 2018-02-26 DIAGNOSIS — I824Z2 Acute embolism and thrombosis of unspecified deep veins of left distal lower extremity: Secondary | ICD-10-CM

## 2018-02-26 DIAGNOSIS — R5382 Chronic fatigue, unspecified: Secondary | ICD-10-CM

## 2018-02-26 DIAGNOSIS — Z7901 Long term (current) use of anticoagulants: Secondary | ICD-10-CM | POA: Insufficient documentation

## 2018-02-26 DIAGNOSIS — R634 Abnormal weight loss: Secondary | ICD-10-CM

## 2018-02-26 DIAGNOSIS — R630 Anorexia: Secondary | ICD-10-CM

## 2018-02-26 DIAGNOSIS — N189 Chronic kidney disease, unspecified: Secondary | ICD-10-CM

## 2018-02-26 DIAGNOSIS — R0981 Nasal congestion: Secondary | ICD-10-CM

## 2018-02-26 DIAGNOSIS — R531 Weakness: Secondary | ICD-10-CM

## 2018-02-26 DIAGNOSIS — E876 Hypokalemia: Secondary | ICD-10-CM

## 2018-02-26 DIAGNOSIS — R05 Cough: Secondary | ICD-10-CM

## 2018-02-26 DIAGNOSIS — N2889 Other specified disorders of kidney and ureter: Secondary | ICD-10-CM | POA: Insufficient documentation

## 2018-02-26 LAB — CBC WITH DIFFERENTIAL/PLATELET
Abs Immature Granulocytes: 0.15 10*3/uL — ABNORMAL HIGH (ref 0.00–0.07)
Basophils Absolute: 0 10*3/uL (ref 0.0–0.1)
Basophils Relative: 0 %
Eosinophils Absolute: 0.1 10*3/uL (ref 0.0–0.5)
Eosinophils Relative: 1 %
HEMATOCRIT: 26.2 % — AB (ref 36.0–46.0)
Hemoglobin: 8.6 g/dL — ABNORMAL LOW (ref 12.0–15.0)
Immature Granulocytes: 2 %
LYMPHS ABS: 4 10*3/uL (ref 0.7–4.0)
Lymphocytes Relative: 43 %
MCH: 33.9 pg (ref 26.0–34.0)
MCHC: 32.8 g/dL (ref 30.0–36.0)
MCV: 103.1 fL — ABNORMAL HIGH (ref 80.0–100.0)
MONOS PCT: 8 %
Monocytes Absolute: 0.7 10*3/uL (ref 0.1–1.0)
Neutro Abs: 4.3 10*3/uL (ref 1.7–7.7)
Neutrophils Relative %: 46 %
Platelets: 213 10*3/uL (ref 150–400)
RBC: 2.54 MIL/uL — ABNORMAL LOW (ref 3.87–5.11)
RDW: 22.1 % — ABNORMAL HIGH (ref 11.5–15.5)
WBC: 9.3 10*3/uL (ref 4.0–10.5)
nRBC: 1.1 % — ABNORMAL HIGH (ref 0.0–0.2)

## 2018-02-26 LAB — COMPREHENSIVE METABOLIC PANEL
ALT: 31 U/L (ref 0–44)
AST: 47 U/L — ABNORMAL HIGH (ref 15–41)
Albumin: 3.1 g/dL — ABNORMAL LOW (ref 3.5–5.0)
Alkaline Phosphatase: 107 U/L (ref 38–126)
Anion gap: 9 (ref 5–15)
BILIRUBIN TOTAL: 0.7 mg/dL (ref 0.3–1.2)
BUN: 9 mg/dL (ref 8–23)
CALCIUM: 8.9 mg/dL (ref 8.9–10.3)
CO2: 22 mmol/L (ref 22–32)
Chloride: 109 mmol/L (ref 98–111)
Creatinine, Ser: 1.11 mg/dL — ABNORMAL HIGH (ref 0.44–1.00)
GFR calc Af Amer: 59 mL/min — ABNORMAL LOW (ref >60)
GFR calc non Af Amer: 51 mL/min — ABNORMAL LOW (ref >60)
Glucose, Bld: 142 mg/dL — ABNORMAL HIGH (ref 70–99)
Potassium: 3.2 mmol/L — ABNORMAL LOW (ref 3.5–5.1)
Sodium: 140 mmol/L (ref 135–145)
TOTAL PROTEIN: 7.2 g/dL (ref 6.5–8.1)

## 2018-02-26 MED ORDER — SODIUM CHLORIDE 0.9 % IV SOLN
INTRAVENOUS | Status: DC
  Administered 2018-02-26: 11:00:00 via INTRAVENOUS
  Filled 2018-02-26: qty 250

## 2018-02-26 MED ORDER — LEVOFLOXACIN 500 MG PO TABS: 500.0000 mg | ORAL_TABLET | Freq: Every day | ORAL | 0 refills | Status: DC

## 2018-02-26 MED ORDER — SODIUM CHLORIDE 0.9% FLUSH
10.0000 mL | INTRAVENOUS | Status: DC | PRN
  Administered 2018-02-26: 10 mL via INTRAVENOUS
  Filled 2018-02-26: qty 10

## 2018-02-26 MED ORDER — APIXABAN 5 MG PO TABS: 5.0000 mg | ORAL_TABLET | Freq: Two times a day (BID) | ORAL | 3 refills | Status: DC

## 2018-02-26 MED ORDER — HEPARIN SOD (PORK) LOCK FLUSH 100 UNIT/ML IV SOLN
500.0000 [IU] | Freq: Once | INTRAVENOUS | Status: AC
  Administered 2018-02-26: 500 [IU] via INTRAVENOUS

## 2018-02-26 MED ORDER — SODIUM CHLORIDE 0.9 % IV SOLN
Freq: Once | INTRAVENOUS | Status: AC
  Administered 2018-02-26: 11:00:00 via INTRAVENOUS
  Filled 2018-02-26: qty 1000

## 2018-02-26 NOTE — Progress Notes (Signed)
Met with Cindy Bowman in clinic. She has all of her upcoming appointments. She will see anesthesia at [facility] for preop 1/13 and have her markers placed 1/17 with Dr. Cephas Darby. She was given instructions regarding her Eliquis hold and has full understanding. She sees Dr. Lisbeth Renshaw 1/8 in consult for SBRT.

## 2018-02-26 NOTE — Progress Notes (Signed)
Patient is here today to follow up on her pancreatic adenocarcinoma. Patient stated that she started to have head congestion, sinus drainga and a productive cough. Patient denied fever, chills, nausea, vomiting, constipation, diarrhea.

## 2018-02-27 ENCOUNTER — Ambulatory Visit
Admission: RE | Admit: 2018-02-27 | Discharge: 2018-02-27 | Disposition: A | Discharge status: Home / Self Care | Payer: Medicare Other | Source: Ambulatory Visit | Attending: Radiation Oncology | Admitting: Radiation Oncology

## 2018-02-27 ENCOUNTER — Encounter: Payer: Self-pay | Admitting: Radiation Oncology

## 2018-02-27 ENCOUNTER — Other Ambulatory Visit: Payer: Self-pay

## 2018-02-27 VITALS — BP 147/63 | HR 89 | Temp 98.1°F | Resp 20 | Ht 63.0 in | Wt 144.4 lb

## 2018-02-27 DIAGNOSIS — I10 Essential (primary) hypertension: Secondary | ICD-10-CM | POA: Diagnosis not present

## 2018-02-27 DIAGNOSIS — K439 Ventral hernia without obstruction or gangrene: Secondary | ICD-10-CM | POA: Insufficient documentation

## 2018-02-27 DIAGNOSIS — I82812 Embolism and thrombosis of superficial veins of left lower extremities: Secondary | ICD-10-CM | POA: Insufficient documentation

## 2018-02-27 DIAGNOSIS — K648 Other hemorrhoids: Secondary | ICD-10-CM | POA: Insufficient documentation

## 2018-02-27 DIAGNOSIS — I82412 Acute embolism and thrombosis of left femoral vein: Secondary | ICD-10-CM | POA: Insufficient documentation

## 2018-02-27 DIAGNOSIS — C259 Malignant neoplasm of pancreas, unspecified: Secondary | ICD-10-CM

## 2018-02-27 DIAGNOSIS — K760 Fatty (change of) liver, not elsewhere classified: Secondary | ICD-10-CM | POA: Insufficient documentation

## 2018-02-27 DIAGNOSIS — Z9049 Acquired absence of other specified parts of digestive tract: Secondary | ICD-10-CM | POA: Diagnosis not present

## 2018-02-27 DIAGNOSIS — N2889 Other specified disorders of kidney and ureter: Secondary | ICD-10-CM | POA: Insufficient documentation

## 2018-02-27 DIAGNOSIS — Z7901 Long term (current) use of anticoagulants: Secondary | ICD-10-CM | POA: Insufficient documentation

## 2018-02-27 DIAGNOSIS — Z79899 Other long term (current) drug therapy: Secondary | ICD-10-CM | POA: Insufficient documentation

## 2018-02-27 DIAGNOSIS — N323 Diverticulum of bladder: Secondary | ICD-10-CM | POA: Insufficient documentation

## 2018-02-27 DIAGNOSIS — C250 Malignant neoplasm of head of pancreas: Secondary | ICD-10-CM

## 2018-02-27 NOTE — Progress Notes (Signed)
Radiation Oncology         [phone] ________________________________  Name: Cindy Bowman        MRN: [id number]  Date of Service: 02/27/2018 DOB: December 26, 1950  EX:BMWUX, Delana Meyer, MD  Lloyd Huger, MD     REFERRING PHYSICIAN: Lloyd Huger, MD   DIAGNOSIS: The primary encounter diagnosis was Malignant neoplasm of head of pancreas ([place]). A diagnosis of Pancreatic adenocarcinoma ([place]) was also pertinent to this visit.   HISTORY OF PRESENT ILLNESS: Cindy Bowman is a 68 y.o. female seen at the request of Dr. 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  She presented to [facility] regional ED due to abdominal pain radiating to her back, and was found to have hypodense mass in the uncinate process of the pancreas measuring 3 x 2.8 x 2.4 cm. The lesion was solid and hypoenhancing.  The mass abuts the posterior margin of the SMA and the posterior margin of the SMV without encasement.  The posterior margin of the mass also slightly abuts the left renal vein.  She had a portacaval node measuring 8 mm and a peripancreatic node measuring 6 mm.  In addition there was a 2.6 x 2.8 cm exophytic mass of the left mid kidney concerning for a renal cell carcinoma.  She underwent pet imaging on October 30, 2017 that confirmed hypermetabolism with an SUV of 8 in the pancreatic head mass measuring 3 x 3 cm.  No hypermetabolic lymphadenopathy or metastatic disease was identified.  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  She also had some anal activity felt to be internal hemorrhoids.  She was counseled on additional work-up and underwent an EUS on 10/25/2017 revealing adenocarcinoma consistent with pancreatic primary.  Given that she was borderline resectable because of her vascular involvement, she began neoadjuvant chemotherapy.  She was unable to tolerate full [place], and has been on a total of 3  cycles of single agent Gemzar.  Her initial CA-19-9 was 78, and on 01/09/2018 was 81.  P CT of the abdomen and pelvis with contrast on 02/15/2018 revealed persistence of the pancreatic mass measuring 2.5 x 2.7 cm continuing to see contact with the SMA and SMV.  She is counseled on was counseled on the rationale to consider stereotactic body radiotherapy to reduce her tumor, with the hopes of becoming a surgical candidate.  She comes today to discuss these options. Of note she was also referred to urology to plan intervention of her left renal mass once she's completed treatment of her pancreatic cancer. Of note her surgeon is Dr. Stark Klein.   PREVIOUS RADIATION THERAPY: No   PAST MEDICAL HISTORY:  Past Medical History:  Diagnosis Date  . Essential hypertension   . Pancreatic cancer ([place])   . UTI (urinary tract infection)    She had [facility] Multi-Cancer Panel testing 84 genes on 12/03/17 that was negative.    PAST SURGICAL HISTORY: Past Surgical History:  Procedure Laterality Date  . CESAREAN SECTION    . CHOLECYSTECTOMY    . EUS N/A 10/25/2017   Procedure: FULL UPPER ENDOSCOPIC ULTRASOUND (EUS) RADIAL;  Surgeon: Jola Schmidt, MD;  Location: [facility] ENDOSCOPY;  Service: Endoscopy;  Laterality: N/A;  . OOPHORECTOMY    . PORTA CATH INSERTION N/A 11/12/2017   Procedure: PORTA CATH INSERTION;  Surgeon: Algernon Huxley, MD;  Location: [place] [facility];  Service: Cardiovascular;  Laterality: N/A;  .  TUBAL LIGATION       FAMILY HISTORY:  Family History  Problem Relation Age of Onset  . Hypertension Mother        deceased 25  . Heart attack Father        deceased late 29s     SOCIAL HISTORY:  reports that she has never smoked. She has never used smokeless tobacco. She reports previous alcohol use. She reports that she does not use drugs. The patient is single and lives in [place], [place]. She is originally from [place] and is accompanied by her son.    ALLERGIES: Nsaids; Ibuprofen;  and Penicillins   MEDICATIONS:  Current Outpatient Medications  Medication Sig Dispense Refill  . apixaban (ELIQUIS) 5 MG TABS tablet Take 1 tablet (5 mg total) by mouth 2 (two) times daily. 60 tablet 3  . diphenoxylate-atropine (LOMOTIL) 2.5-0.025 MG tablet Take 1 tablet by mouth 4 (four) times daily as needed for diarrhea or loose stools. 30 tablet 0  . docusate sodium (COLACE) 100 MG capsule Take 2 capsules (200 mg total) by mouth 2 (two) times daily. 30 capsule 0  . levofloxacin (LEVAQUIN) 500 MG tablet Take 1 tablet (500 mg total) by mouth daily. 7 tablet 0  . loperamide (IMODIUM) 2 MG capsule Take by mouth as needed for diarrhea or loose stools.    Marland Kitchen losartan (COZAAR) 50 MG tablet Take 1 tablet (50 mg total) by mouth daily. 30 tablet 0  . ondansetron (ZOFRAN ODT) 4 MG disintegrating tablet Take 1 tablet (4 mg total) by mouth every 8 (eight) hours as needed for nausea or vomiting. 20 tablet 0  . Oxycodone HCl 10 MG TABS Take 1 tablet (10 mg total) by mouth every 6 (six) hours as needed. 60 tablet 0  . fentaNYL (DURAGESIC - DOSED MCG/HR) 25 MCG/HR patch Place 1 patch (25 mcg total) onto the skin every 3 (three) days. (Patient not taking: Reported on 02/27/2018) 10 patch 0  . potassium chloride SA (KLOR-CON M20) 20 MEQ tablet Take 1 tablet (20 mEq total) by mouth daily. (Patient not taking: Reported on 02/27/2018) 90 tablet 1   No current facility-administered medications for this encounter.    Facility-Administered Medications Ordered in Other Encounters  Medication Dose Route Frequency Provider Last Rate Last Dose  . prochlorperazine (COMPAZINE) injection 10 mg  10 mg Intravenous Once Rexene Agent         REVIEW OF SYSTEMS: On review of systems, the patient reports that she is doing well overall, but still having some residual food aversion at times though states her appetite is better in the last few weeks. She reports she occasionally has a hard time keeping down food, but  denies any  chest pain, shortness of breath, cough, fevers, chills, night sweats. She denies any bowel or bladder disturbances, and denies abdominal pain. She hast lost 44 pounds in the last 6 months. She denies any new musculoskeletal or joint aches or pains. A complete review of systems is obtained and is otherwise negative.     PHYSICAL EXAM:  Wt Readings from Last 3 Encounters:  02/27/18 144 lb 6.4 oz (65.5 kg)  02/19/18 146 lb 6.4 oz (66.4 kg)  01/30/18 149 lb 11.2 oz (67.9 kg)   Temp Readings from Last 3 Encounters:  02/27/18 98.1 F (36.7 C) (Oral)  02/26/18 (!) 97.3 F (36.3 C) (Tympanic)  02/19/18 97.8 F (36.6 C) (Tympanic)   BP Readings from Last 3 Encounters:  02/27/18 (!) 147/63  02/26/18 Marland Kitchen)  142/73  02/19/18 (!) 147/73   Pulse Readings from Last 3 Encounters:  02/27/18 89  02/26/18 (!) 110  02/19/18 95   Pain Assessment Pain Score: 0-No pain/10  In general this is a well appearing African American female in no acute distress. She is alert and oriented x4 and appropriate throughout the examination. HEENT reveals that the patient is normocephalic, atraumatic. EOMs are intact.  Skin is intact without any evidence of gross lesions. Cardiopulmonary assessment is negative for acute distress and she exhibits normal effort.     ECOG = 1  0 - Asymptomatic (Fully active, able to carry on all predisease activities without restriction)  1 - Symptomatic but completely ambulatory (Restricted in physically strenuous activity but ambulatory and able to carry out work of a light or sedentary nature. For example, light housework, office work)  2 - Symptomatic, <50% in bed during the day (Ambulatory and capable of all self care but unable to carry out any work activities. Up and about more than 50% of waking hours)  3 - Symptomatic, >50% in bed, but not bedbound (Capable of only limited self-care, confined to bed or chair 50% or more of waking hours)  4 - Bedbound (Completely disabled.  Cannot carry on any self-care. Totally confined to bed or chair)  5 - Death   Eustace Pen MM, Creech RH, Tormey DC, et al. [id number]). "Toxicity and response criteria of the [facility]". [place] Oncol. 5 (6): 649-55    LABORATORY DATA:  Lab Results  Component Value Date   WBC 9.3 02/26/2018   HGB 8.6 (L) 02/26/2018   HCT 26.2 (L) 02/26/2018   MCV 103.1 (H) 02/26/2018   PLT 213 02/26/2018   Lab Results  Component Value Date   NA 140 02/26/2018   K 3.2 (L) 02/26/2018   CL 109 02/26/2018   CO2 22 02/26/2018   Lab Results  Component Value Date   ALT 31 02/26/2018   AST 47 (H) 02/26/2018   ALKPHOS 107 02/26/2018   BILITOT 0.7 02/26/2018      RADIOGRAPHY: Ct Abdomen Pelvis W Contrast  Result Date: 02/15/2018 CLINICAL DATA:  68 year old female for follow-up/restaging of pancreatic malignancy. Currently undergoing chemotherapy. EXAM: CT ABDOMEN AND PELVIS WITH CONTRAST TECHNIQUE: Multidetector CT imaging of the abdomen and pelvis was performed using the standard protocol following bolus administration of intravenous contrast. CONTRAST:  150mL ISOVUE-300 IOPAMIDOL (ISOVUE-300) INJECTION 61% COMPARISON:  12/12/2017 CT and prior studies FINDINGS: Lower chest: No acute or new abnormalities. Hepatobiliary: Hepatic steatosis again noted. No focal hepatic lesions are present. Patient is status post cholecystectomy. No biliary dilatation. Pancreas: Pancreatic uncinate process mass is unchanged in size measuring 2.5 x 2.7 cm (series 2: Image 26). Contact with the SMA and SMV again noted. The remainder of the pancreas is unremarkable. Spleen: Unremarkable Adrenals/Urinary Tract: A 3 cm enhancing solid and cystic mass involving the LATERAL LEFT mid kidney is unchanged. There is no evidence of hydronephrosis or new renal abnormality. The adrenal glands are unremarkable. Bladder unremarkable with known bladder diverticulum difficult to visualized on this exam. Stomach/Bowel: No bowel  wall thickening, bowel obstruction or inflammatory changes. Colonic diverticulosis noted without evidence of diverticulitis. Vascular/Lymphatic: No significant vascular findings are present. No enlarged abdominal or pelvic lymph nodes. Reproductive: Uterus and bilateral adnexa are unremarkable. Other: No ascites or peritoneal/omental abnormality. A moderate supraumbilical ventral hernia containing fat is unchanged. No pneumoperitoneum or focal collection/abscess. Musculoskeletal: No acute or suspicious bony abnormalities. IMPRESSION: 1. No significant  change in 2.7 cm pancreatic uncinate process mass since 12/12/2017. No evidence of distant metastases. 2. Unchanged 3 cm LEFT renal mass highly suspicious for renal cell carcinoma. 3. No new or significant changes from the prior study. Electronically Signed   By: Margarette Canada M.D.   On: 02/15/2018 09:45   US Venous Img Lower Unilateral Left  Result Date: 02/06/2018 CLINICAL DATA:  Left lower extremity pain.  Pancreatic cancer. EXAM: LEFT LOWER EXTREMITY VENOUS DUPLEX ULTRASOUND TECHNIQUE: Doppler venous assessment of the left lower extremity deep venous system was performed, including characterization of spectral flow, compressibility, and phasicity. COMPARISON:  None. FINDINGS: The common femoral vein, proximal femoral vein, and mid femoral vein are compressible. The distal femoral vein is noncompressible and contains occlusive thrombus. There is also occlusive thrombus throughout the popliteal vein. There is nonocclusive thrombus in the posterior tibial and peroneal veins. There is also superficial thrombus in the left lesser saphenous vein. Doppler analysis in the common femoral and femoral vein demonstrates phasicity. Augmentation was deferred. 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 There is nonocclusive thrombus within deep calf veins. Electronically Signed   By: Marybelle Killings M.D.    On: 02/06/2018 16:36       IMPRESSION/PLAN: 1. 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 Dr. Lisbeth Renshaw discusses the patient's previous pathology findings and reviews the nature of early pancreatic cancers. Given the proximity to her SMV and SMA, she was not a candidate to proceed with surgery upfront. She has had stability of her tumor following neoadjuvant chemotherapy, and after discussing her case would be a candidate for stereotactic body radiotherapy (SBRT).  Dr. Lisbeth Renshaw discusses the utility of radiotherapy to try to shrink her tumor with the hopes of becoming surgically resectable. We discussed the risks, benefits, short, and long term effects of radiotherapy, and the patient is interested in proceeding. Dr. Lisbeth Renshaw discusses the delivery and logistics of radiotherapy and anticipates a course of 5 fractions of radiotherapy. She is already scheduled for fiducial markers on 03/08/2018. We will plan simulation the week of 03/12/2018 and she will be contacted by our staff to coordinate this appointment. Written consent is obtained and placed in the chart, a copy was provided to the patient. 2. Left renal mass concerning for possible renal cell carcinoma. This will be observed until she has completed treatment of her pancreatic cancer.  In a visit lasting 45 minutes, greater than 50% of the time was spent face to face discussing her case, and coordinating the patient's care.  The above documentation reflects my direct findings during this shared patient visit. Please see the separate note by Dr. Lisbeth Renshaw on this date for the remainder of the patient's plan of care.    Carola Rhine, PAC

## 2018-03-05 ENCOUNTER — Ambulatory Visit: Payer: Medicare Other

## 2018-03-05 ENCOUNTER — Ambulatory Visit: Payer: Medicare Other | Admitting: Hospice and Palliative Medicine

## 2018-03-05 ENCOUNTER — Ambulatory Visit: Payer: Medicare Other | Admitting: Oncology

## 2018-03-05 ENCOUNTER — Other Ambulatory Visit: Payer: Medicare Other

## 2018-03-07 ENCOUNTER — Telehealth: Payer: Self-pay

## 2018-03-07 NOTE — Telephone Encounter (Signed)
Call received from Ms. Forton regarding her appointment in [place] on 1/21. Confirmed with radiation that she has her simulation 1/21 at 1000 with tentative start date for radiation around 1/28. We will reschedule her 1/21 appointments here. Message sent to scheduling.

## 2018-03-08 ENCOUNTER — Other Ambulatory Visit: Payer: Self-pay

## 2018-03-08 ENCOUNTER — Inpatient Hospital Stay
Admission: EM | Admit: 2018-03-08 | Discharge: 2018-03-10 | DRG: 438 | Disposition: A | Discharge status: Other Acute Inpatient Hospital | Payer: Medicare Other | Attending: Internal Medicine | Admitting: Internal Medicine

## 2018-03-08 ENCOUNTER — Emergency Department: Payer: Medicare Other

## 2018-03-08 ENCOUNTER — Encounter: Payer: Self-pay | Admitting: Emergency Medicine

## 2018-03-08 DIAGNOSIS — A419 Sepsis, unspecified organism: Secondary | ICD-10-CM | POA: Diagnosis not present

## 2018-03-08 DIAGNOSIS — K8582 Other acute pancreatitis with infected necrosis: Principal | ICD-10-CM | POA: Diagnosis present

## 2018-03-08 DIAGNOSIS — Z880 Allergy status to penicillin: Secondary | ICD-10-CM

## 2018-03-08 DIAGNOSIS — R188 Other ascites: Secondary | ICD-10-CM | POA: Diagnosis present

## 2018-03-08 DIAGNOSIS — K859 Acute pancreatitis without necrosis or infection, unspecified: Secondary | ICD-10-CM | POA: Diagnosis not present

## 2018-03-08 DIAGNOSIS — J189 Pneumonia, unspecified organism: Secondary | ICD-10-CM

## 2018-03-08 DIAGNOSIS — Z79899 Other long term (current) drug therapy: Secondary | ICD-10-CM

## 2018-03-08 DIAGNOSIS — N179 Acute kidney failure, unspecified: Secondary | ICD-10-CM | POA: Diagnosis present

## 2018-03-08 DIAGNOSIS — Z9221 Personal history of antineoplastic chemotherapy: Secondary | ICD-10-CM

## 2018-03-08 DIAGNOSIS — R6520 Severe sepsis without septic shock: Secondary | ICD-10-CM | POA: Diagnosis not present

## 2018-03-08 DIAGNOSIS — Z886 Allergy status to analgesic agent status: Secondary | ICD-10-CM

## 2018-03-08 DIAGNOSIS — K8681 Exocrine pancreatic insufficiency: Secondary | ICD-10-CM | POA: Diagnosis present

## 2018-03-08 DIAGNOSIS — K760 Fatty (change of) liver, not elsewhere classified: Secondary | ICD-10-CM | POA: Diagnosis present

## 2018-03-08 DIAGNOSIS — Z7901 Long term (current) use of anticoagulants: Secondary | ICD-10-CM

## 2018-03-08 DIAGNOSIS — Z8249 Family history of ischemic heart disease and other diseases of the circulatory system: Secondary | ICD-10-CM

## 2018-03-08 DIAGNOSIS — Z9049 Acquired absence of other specified parts of digestive tract: Secondary | ICD-10-CM

## 2018-03-08 DIAGNOSIS — I10 Essential (primary) hypertension: Secondary | ICD-10-CM | POA: Diagnosis present

## 2018-03-08 DIAGNOSIS — C259 Malignant neoplasm of pancreas, unspecified: Secondary | ICD-10-CM

## 2018-03-08 DIAGNOSIS — D649 Anemia, unspecified: Secondary | ICD-10-CM | POA: Diagnosis not present

## 2018-03-08 DIAGNOSIS — Z90722 Acquired absence of ovaries, bilateral: Secondary | ICD-10-CM

## 2018-03-08 DIAGNOSIS — K858 Other acute pancreatitis without necrosis or infection: Secondary | ICD-10-CM

## 2018-03-08 DIAGNOSIS — Z66 Do not resuscitate: Secondary | ICD-10-CM | POA: Diagnosis present

## 2018-03-08 DIAGNOSIS — E43 Unspecified severe protein-calorie malnutrition: Secondary | ICD-10-CM

## 2018-03-08 LAB — CBC
HCT: 29.7 % — ABNORMAL LOW (ref 36.0–46.0)
Hemoglobin: 9.8 g/dL — ABNORMAL LOW (ref 12.0–15.0)
MCH: 35 pg — ABNORMAL HIGH (ref 26.0–34.0)
MCHC: 33 g/dL (ref 30.0–36.0)
MCV: 106.1 fL — ABNORMAL HIGH (ref 80.0–100.0)
PLATELETS: 341 10*3/uL (ref 150–400)
RBC: 2.8 MIL/uL — ABNORMAL LOW (ref 3.87–5.11)
RDW: 21.9 % — ABNORMAL HIGH (ref 11.5–15.5)
WBC: 19.9 10*3/uL — ABNORMAL HIGH (ref 4.0–10.5)
nRBC: 0 % (ref 0.0–0.2)

## 2018-03-08 LAB — COMPREHENSIVE METABOLIC PANEL
ALK PHOS: 96 U/L (ref 38–126)
ALT: 23 U/L (ref 0–44)
AST: 38 U/L (ref 15–41)
Albumin: 3.2 g/dL — ABNORMAL LOW (ref 3.5–5.0)
Anion gap: 10 (ref 5–15)
BUN: 12 mg/dL (ref 8–23)
CALCIUM: 8.7 mg/dL — AB (ref 8.9–10.3)
CO2: 20 mmol/L — ABNORMAL LOW (ref 22–32)
Chloride: 111 mmol/L (ref 98–111)
Creatinine, Ser: 0.92 mg/dL (ref 0.44–1.00)
GFR calc Af Amer: >60 mL/min (ref >60)
GFR calc non Af Amer: >60 mL/min (ref >60)
Glucose, Bld: 124 mg/dL — ABNORMAL HIGH (ref 70–99)
Potassium: 3.5 mmol/L (ref 3.5–5.1)
SODIUM: 141 mmol/L (ref 135–145)
Total Bilirubin: 0.8 mg/dL (ref 0.3–1.2)
Total Protein: 7.1 g/dL (ref 6.5–8.1)

## 2018-03-08 LAB — LIPASE, BLOOD: Lipase: 2107 U/L — ABNORMAL HIGH (ref 11–51)

## 2018-03-08 MED ORDER — ALBUTEROL SULFATE (2.5 MG/3ML) 0.083% IN NEBU: 2.5000 mg | INHALATION_SOLUTION | RESPIRATORY_TRACT | Status: DC | PRN

## 2018-03-08 MED ORDER — HYDROMORPHONE HCL 1 MG/ML IJ SOLN
1.0000 mg | INTRAMUSCULAR | Status: AC
  Administered 2018-03-08: 1 mg via INTRAVENOUS
  Filled 2018-03-08: qty 1

## 2018-03-08 MED ORDER — SENNOSIDES-DOCUSATE SODIUM 8.6-50 MG PO TABS: 1.0000 | ORAL_TABLET | Freq: Every evening | ORAL | Status: DC | PRN

## 2018-03-08 MED ORDER — HYDROCODONE-ACETAMINOPHEN 5-325 MG PO TABS
1.0000 | ORAL_TABLET | ORAL | Status: DC | PRN
  Administered 2018-03-08 – 2018-03-10 (×7): 2 via ORAL
  Filled 2018-03-08 (×7): qty 2

## 2018-03-08 MED ORDER — SODIUM CHLORIDE 0.9 % IV BOLUS: 250.0000 mL | Freq: Once | INTRAVENOUS | Status: DC

## 2018-03-08 MED ORDER — IOPAMIDOL (ISOVUE-300) INJECTION 61%
15.0000 mL | Freq: Once | INTRAVENOUS | Status: AC
  Administered 2018-03-08: 15 mL via ORAL

## 2018-03-08 MED ORDER — LOSARTAN POTASSIUM 50 MG PO TABS
50.0000 mg | ORAL_TABLET | Freq: Every day | ORAL | Status: DC
  Administered 2018-03-08 – 2018-03-09 (×2): 50 mg via ORAL
  Filled 2018-03-08 (×3): qty 1

## 2018-03-08 MED ORDER — ACETAMINOPHEN 650 MG RE SUPP: 650.0000 mg | Freq: Four times a day (QID) | RECTAL | Status: DC | PRN

## 2018-03-08 MED ORDER — IOHEXOL 300 MG/ML  SOLN
75.0000 mL | Freq: Once | INTRAMUSCULAR | Status: AC | PRN
  Administered 2018-03-08: 75 mL via INTRAVENOUS

## 2018-03-08 MED ORDER — IOPAMIDOL (ISOVUE-300) INJECTION 61%: 30.0000 mL | Freq: Once | INTRAVENOUS | Status: DC

## 2018-03-08 MED ORDER — ONDANSETRON HCL 4 MG/2ML IJ SOLN
4.0000 mg | Freq: Four times a day (QID) | INTRAMUSCULAR | Status: DC | PRN
  Administered 2018-03-08: 4 mg via INTRAVENOUS
  Filled 2018-03-08: qty 2

## 2018-03-08 MED ORDER — ACETAMINOPHEN 325 MG PO TABS: 650.0000 mg | ORAL_TABLET | Freq: Four times a day (QID) | ORAL | Status: DC | PRN

## 2018-03-08 MED ORDER — SODIUM CHLORIDE 0.9 % IV BOLUS
1000.0000 mL | Freq: Once | INTRAVENOUS | Status: AC
  Administered 2018-03-08: 1000 mL via INTRAVENOUS

## 2018-03-08 MED ORDER — PROMETHAZINE HCL 25 MG/ML IJ SOLN
12.5000 mg | Freq: Once | INTRAMUSCULAR | Status: AC
  Administered 2018-03-08: 12.5 mg via INTRAVENOUS
  Filled 2018-03-08: qty 1

## 2018-03-08 MED ORDER — ONDANSETRON HCL 4 MG/2ML IJ SOLN
4.0000 mg | Freq: Once | INTRAMUSCULAR | Status: AC | PRN
  Administered 2018-03-08: 4 mg via INTRAVENOUS
  Filled 2018-03-08: qty 2

## 2018-03-08 MED ORDER — BISACODYL 5 MG PO TBEC: 5.0000 mg | DELAYED_RELEASE_TABLET | Freq: Every day | ORAL | Status: DC | PRN

## 2018-03-08 MED ORDER — ONDANSETRON HCL 4 MG PO TABS: 4.0000 mg | ORAL_TABLET | Freq: Four times a day (QID) | ORAL | Status: DC | PRN

## 2018-03-08 MED ORDER — HYDROMORPHONE HCL 1 MG/ML IJ SOLN
1.0000 mg | INTRAMUSCULAR | Status: DC | PRN
  Administered 2018-03-09 – 2018-03-10 (×10): 1 mg via INTRAVENOUS
  Filled 2018-03-08 (×11): qty 1

## 2018-03-08 MED ORDER — APIXABAN 5 MG PO TABS
5.0000 mg | ORAL_TABLET | Freq: Two times a day (BID) | ORAL | Status: DC
  Administered 2018-03-08 – 2018-03-09 (×3): 5 mg via ORAL
  Filled 2018-03-08 (×4): qty 1

## 2018-03-08 MED ORDER — LOPERAMIDE HCL 2 MG PO CAPS: 2.0000 mg | ORAL_CAPSULE | ORAL | Status: DC | PRN

## 2018-03-08 MED ORDER — SODIUM CHLORIDE 0.9 % IV SOLN
INTRAVENOUS | Status: DC
  Administered 2018-03-09: via INTRAVENOUS

## 2018-03-08 MED ORDER — HYDROMORPHONE HCL 1 MG/ML IJ SOLN
1.0000 mg | Freq: Once | INTRAMUSCULAR | Status: AC
  Administered 2018-03-08: 1 mg via INTRAVENOUS
  Filled 2018-03-08: qty 1

## 2018-03-08 MED ORDER — SODIUM CHLORIDE 0.9% FLUSH: 3.0000 mL | Freq: Once | INTRAVENOUS | Status: DC

## 2018-03-08 NOTE — ED Triage Notes (Signed)
Pt had an endoscopy earlier today, states increasing nausea causing vomiting and now green liquid bile. Son states she has also vomited blood today as well. Pt had markers placed for radiation treatment for pancreatic cancer. Appears weak. NAD at this time.

## 2018-03-08 NOTE — ED Notes (Signed)
Admitting team at bedside.

## 2018-03-08 NOTE — ED Notes (Addendum)
Patient had endoscopy this morning to place markers for radiation for pancreatic cancer. Patient started experiencing nausea and stomach pain that has gradually gotten worse. Patient tried calling staff where she had endoscopy completed and they never called her back. Last chemo on December 31st

## 2018-03-08 NOTE — ED Notes (Signed)
Pt reporting 10/10 mid lower back pain, this is new for pt as she came in for abd pain. Pt states she had this pain when she was first dx with the pancreatic ca. Pt states it went away and is now back but worse. Son is sitting with pt, call bell within reach. Will speak with MD to discuss further pain management.

## 2018-03-08 NOTE — ED Provider Notes (Signed)
[facility] Emergency Department Provider Note  ____________________________________________  Time seen: Approximately 8:08 PM  I have reviewed the triage vital signs and the nursing notes.   HISTORY  Chief Complaint Emesis; Nausea; and Abdominal Pain    HPI Cindy Bowman is a 68 y.o. female with a history of pancreatic adenocarcinoma of the pancreatic head being managed by Dr. Grayland Ormond here at [facility], who had an endoscopic ultrasound done at [place] today for placement of radiation markers.  After the procedure she recovered well and was discharged home.  However, as the days gone on she is developed gradual onset epigastric pain that is severe, nonradiating, associated with nausea and vomiting.  No diarrhea or constipation.  She reports that her first 2 episodes of emesis had a small amount of blood which has cleared.  No black or bloody stool.  No fever.  Pain is severe and sharp      Past Medical History:  Diagnosis Date  . Essential hypertension   . Pancreatic cancer ([place])   . UTI (urinary tract infection)      Patient Active Problem List   Diagnosis Date Noted  . Acute pancreatitis 03/08/2018  . Chemotherapy induced neutropenia ([place]) 12/12/2017  . 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 Nausea & vomiting 11/16/2017  . Pancreatic adenocarcinoma ([place]) 10/18/2017     Past Surgical History:  Procedure Laterality Date  . CESAREAN SECTION    . CHOLECYSTECTOMY    . EUS N/A 10/25/2017   Procedure: FULL UPPER ENDOSCOPIC ULTRASOUND (EUS) RADIAL;  Surgeon: Jola Schmidt, MD;  Location: [facility] ENDOSCOPY;  Service: Endoscopy;  Laterality: N/A;  . OOPHORECTOMY    . PORTA CATH INSERTION N/A 11/12/2017   Procedure: PORTA CATH INSERTION;  Surgeon: Algernon Huxley, MD;  Location: [place] [facility];  Service: Cardiovascular;  Laterality: N/A;  . TUBAL LIGATION       Prior to Admission medications   Medication Sig Start Date End Date Taking? Authorizing  Provider  apixaban (ELIQUIS) 5 MG TABS tablet Take 1 tablet (5 mg total) by mouth 2 (two) times daily. 02/26/18  Yes Lloyd Huger, MD  loperamide (IMODIUM) 2 MG capsule Take 2 mg by mouth as needed for diarrhea or loose stools.    Yes [provider]  losartan (COZAAR) 50 MG tablet Take 1 tablet (50 mg total) by mouth daily. 11/19/17  Yes Vaughan Basta, MD  ondansetron (ZOFRAN ODT) 4 MG disintegrating tablet Take 1 tablet (4 mg total) by mouth every 8 (eight) hours as needed for nausea or vomiting. 10/17/17  Yes Earleen Newport, MD  Oxycodone HCl 10 MG TABS Take 1 tablet (10 mg total) by mouth every 6 (six) hours as needed. 02/06/18  Yes Lloyd Huger, MD  prochlorperazine (COMPAZINE) 10 MG tablet Take 1 tablet (10 mg total) by mouth every 6 (six) hours as needed (NAUSEA). 11/06/17 12/20/17  Lloyd Huger, MD     Allergies Nsaids; Ibuprofen; and Penicillins   Family History  Problem Relation Age of Onset  . Hypertension Mother        deceased 13  . Heart attack Father        deceased late 24s    Social History Social History   Tobacco Use  . Smoking status: Never Smoker  . Smokeless tobacco: Never Used  Substance Use Topics  . Alcohol use: Not Currently  . Drug use: Never    Review of Systems  Constitutional:   No fever or chills.  ENT:   No sore throat. No rhinorrhea. Cardiovascular:   No chest pain or syncope. Respiratory:   No dyspnea or cough. Gastrointestinal:   Positive as above for abdominal pain and vomiting. Musculoskeletal:   Negative for focal pain or swelling All other systems reviewed and are negative except as documented above in ROS and HPI.  ____________________________________________   PHYSICAL EXAM:  VITAL SIGNS: ED Triage Vitals  Enc Vitals Group     BP 03/08/18 1457 (!) 143/61     Pulse Rate 03/08/18 1457 100     Resp 03/08/18 1457 18     Temp 03/08/18 1457 98.3 F (36.8 C)     Temp Source 03/08/18 1457  Oral     SpO2 03/08/18 1457 100 %     Weight 03/08/18 1449 144 lb (65.3 kg)     Height 03/08/18 1449 5\' 3"  (1.6 m)     Head Circumference --      Peak Flow --      Pain Score 03/08/18 1449 8     Pain Loc --      Pain Edu? --      Excl. in [place]? --     Vital signs reviewed, nursing assessments reviewed.   Constitutional:   Alert and oriented.  Ill-appearing. Eyes:   Conjunctivae are normal. EOMI. PERRL. ENT      Head:   Normocephalic and atraumatic.      Nose:   No congestion/rhinnorhea.       Mouth/Throat:   MMM, no pharyngeal erythema. No peritonsillar mass.       Neck:   No meningismus. Full ROM. Hematological/Lymphatic/Immunilogical:   No cervical lymphadenopathy. Cardiovascular:   Tachycardia heart rate 110. Symmetric bilateral radial and DP pulses.  No murmurs. Cap refill less than 2 seconds. Respiratory:   Normal respiratory effort without tachypnea/retractions. Breath sounds are clear and equal bilaterally. No wheezes/rales/rhonchi. Gastrointestinal:   Soft with pronounced epigastric and left upper quadrant tenderness . Non distended. There is no CVA tenderness.  No rebound, rigidity, or guarding. Musculoskeletal:   Normal range of motion in all extremities. No joint effusions.  No lower extremity tenderness.  No edema. Neurologic:   Normal speech and language.  Motor grossly intact. No acute focal neurologic deficits are appreciated.  Skin:    Skin is warm, dry and intact. No rash noted.  No petechiae, purpura, or bullae.  ____________________________________________    LABS (pertinent positives/negatives) (all labs ordered are listed, but only abnormal results are displayed) Labs Reviewed  LIPASE, BLOOD - Abnormal; Notable for the following components:      Result Value   Lipase 2,107 (*)    All other components within normal limits  COMPREHENSIVE METABOLIC PANEL - Abnormal; Notable for the following components:   CO2 20 (*)    Glucose, Bld 124 (*)    Calcium 8.7  (*)    Albumin 3.2 (*)    All other components within normal limits  CBC - Abnormal; Notable for the following components:   WBC 19.9 (*)    RBC 2.80 (*)    Hemoglobin 9.8 (*)    HCT 29.7 (*)    MCV 106.1 (*)    MCH 35.0 (*)    RDW 21.9 (*)    All other components within normal limits  URINALYSIS, COMPLETE (UACMP) WITH MICROSCOPIC   ____________________________________________   EKG    ____________________________________________    RADIOLOGY  Ct Abdomen Pelvis W Contrast  Result Date: 03/08/2018 CLINICAL DATA:  Endoscopy  today with increasing nausea and bilious vomiting. Patient is currently on radiation therapy for pancreatic cancer. EXAM: CT ABDOMEN AND PELVIS WITH CONTRAST TECHNIQUE: Multidetector CT imaging of the abdomen and pelvis was performed using the standard protocol following bolus administration of intravenous contrast. CONTRAST:  22mL OMNIPAQUE IOHEXOL 300 MG/ML  SOLN COMPARISON:  February 15, 2018 FINDINGS: Lower chest: There is mild atelectasis of the lung bases. The heart size is normal. Hepatobiliary: Diffuse low density of the liver without vessel displacement is identified. No focal liver lesion is noted. Patient status post prior cholecystectomy. The intra and extrahepatic biliary ducts are normal. Pancreas: Surgical clips with associated hypodense mass is identified in the pancreatic head consistent with patient's known pancreatic cancer. Spleen: Normal in size without focal abnormality. Adrenals/Urinary Tract: The bilateral adrenal glands are normal. The right kidney is normal. There is a heterogeneous enhancing partial cystic mass in the lateral midpole left kidney measuring 2.8 x 3 cm unchanged compared prior exam. Fluid-filled bladder is normal. Stomach/Bowel: There is hiatal hernia. There is mild diffuse bowel wall thickening of the stomach with surrounding fluid and inflammatory change. There is no small bowel obstruction. There is diverticulosis of colon  without diverticulitis. The appendix is not definitely seen. Vascular/Lymphatic: No significant vascular findings are present. No enlarged abdominal or pelvic lymph nodes. Reproductive: Uterus and bilateral adnexa are unremarkable. Other: Moderate ascites is identified in the abdomen and pelvis. There is midline umbilical herniation of mesenteric fat. There is no free air. Musculoskeletal: Degenerative joint changes of the spine are noted. IMPRESSION: Diffuse bowel wall thickening of the stomach. This is nonspecific. Finding can be due to infectious/inflammatory process. No free air. Mass in the pancreatic head consistent with patient's known pancreatic cancer. Moderate ascites in the abdomen and pelvis. Heterogeneous enhancing partial cystic mass in the lateral midpole left kidney unchanged suspicious for renal cell carcinoma. Electronically Signed   By: Abelardo Diesel M.D.   On: 03/08/2018 18:15    ____________________________________________   PROCEDURES Procedures  ____________________________________________  DIFFERENTIAL DIAGNOSIS   GI perforation, pancreatitis, viral syndrome  CLINICAL IMPRESSION / ASSESSMENT AND PLAN / ED COURSE  Pertinent labs & imaging results that were available during my care of the patient were reviewed by me and considered in my medical decision making (see chart for details).    Patient presents with severe abdominal pain and vomiting after upper endoscopy procedure today.  Doubt a frank upper GI bleed, think that she had some streaks of blood due to manipulation related to the marker placement.  However with her pain and recent procedure CT is needed to rule out perforation primarily.  Give IV fluids, IV Dilaudid 1 mg, IV Phenergan 12.5 mg for symptom relief.  Clinical Course as of Mar 08 2006  Fri Mar 08, 2018  1842 Lipase(!): 2,107 [PS]    Clinical Course User Index [PS] Carrie Mew, MD    ----------------------------------------- 8:11 PM on  03/08/2018 -----------------------------------------  Lipase severely elevated indicative of postprocedural pancreatitis.  CT scan shows no evidence of perforation or other complications.  Discussed with surgery Dr. Lysle Pearl who advises no specific issues related to the markers and [place] cancer.  Discussed with Dr. Grayland Ormond as well who agrees with usual pancreatitis care.  Discussed with the hospitalist Dr. Bridgett Larsson for further evaluation and management.  Patient given 2 additional doses of Dilaudid 1 mg and a second liter of IV fluids for continued hydration.  On reassessment pain is reasonably controlled, not vomiting.  Patient is calm  and not overly sedated   ____________________________________________   FINAL CLINICAL IMPRESSION(S) / ED DIAGNOSES    Final diagnoses:  Other acute pancreatitis, unspecified complication status  Pancreatic adenocarcinoma [facility])     ED Discharge Orders    None      Portions of this note were generated with dragon dictation software. Dictation errors may occur despite best attempts at proofreading.   Carrie Mew, MD 03/08/18 2012

## 2018-03-08 NOTE — ED Notes (Signed)
Transport to floor room 114.AS

## 2018-03-08 NOTE — H&P (Signed)
[place] [facility] [place] NAME: Cindy Bowman    MR#:  [id number]  DATE OF BIRTH:  09/27/1950  DATE OF ADMISSION:  03/08/2018  PRIMARY CARE PHYSICIAN: Glendon Axe, MD   REQUESTING/REFERRING PHYSICIAN: Dr. Joni Fears.  CHIEF COMPLAINT:   Chief Complaint  Patient presents with  . Emesis  . Nausea  . Abdominal Pain   Abdominal pain nausea after EGD today. HISTORY OF PRESENT ILLNESS:  Cindy Bowman  is a 68 y.o. female with a known history of pancreatic cancer diagnosed last August, hypertension and UTI.  The patient had EGD done in [facility] with placement of radiation markers today.  She developed abdominal pain which has been worsening during the day, associated with nausea no vomiting.  She denies any fever or chills, no diarrhea melena or bloody stool.  Abdominal pain is in the middle of part of the abdomen, constant, 8 out of 10 without radiation.  Lipase is elevated at 2,107.  PAST MEDICAL HISTORY:   Past Medical History:  Diagnosis Date  . Essential hypertension   . Pancreatic cancer ([place])   . UTI (urinary tract infection)     PAST SURGICAL HISTORY:   Past Surgical History:  Procedure Laterality Date  . CESAREAN SECTION    . CHOLECYSTECTOMY    . EUS N/A 10/25/2017   Procedure: FULL UPPER ENDOSCOPIC ULTRASOUND (EUS) RADIAL;  Surgeon: Jola Schmidt, MD;  Location: [facility] ENDOSCOPY;  Service: Endoscopy;  Laterality: N/A;  . OOPHORECTOMY    . PORTA CATH INSERTION N/A 11/12/2017   Procedure: PORTA CATH INSERTION;  Surgeon: Algernon Huxley, MD;  Location: [place] [facility];  Service: Cardiovascular;  Laterality: N/A;  . TUBAL LIGATION      SOCIAL HISTORY:   Social History   Tobacco Use  . Smoking status: Never Smoker  . Smokeless tobacco: Never Used  Substance Use Topics  . Alcohol use: Not Currently    FAMILY HISTORY:   Family History  Problem Relation Age of Onset  . Hypertension Mother        deceased 51  . Heart attack  Father        deceased late 39s    DRUG ALLERGIES:   Allergies  Allergen Reactions  . Nsaids Other (See Comments)    Decreased GFR  . Ibuprofen     Lowers kidney function  . Penicillins     Yeast infection  Has patient had a PCN reaction causing immediate rash, facial/tongue/throat swelling, SOB or lightheadedness with hypotension: No Has patient had a PCN reaction causing severe rash involving mucus membranes or skin necrosis: No Has patient had a PCN reaction that required hospitalization: No Has patient had a PCN reaction occurring within the last 10 years: No If all of the above answers are "NO", then may proceed with Cephalosporin use.     REVIEW OF SYSTEMS:   Review of Systems  Constitutional: Positive for malaise/fatigue. Negative for chills and fever.  HENT: Negative for sore throat.   Eyes: Negative for blurred vision and double vision.  Respiratory: Negative for cough, hemoptysis, shortness of breath, wheezing and stridor.   Cardiovascular: Negative for chest pain, palpitations, orthopnea and leg swelling.  Gastrointestinal: Positive for abdominal pain, nausea and vomiting. Negative for blood in stool, diarrhea and melena.  Genitourinary: Negative for dysuria, flank pain and hematuria.  Musculoskeletal: Negative for back pain and joint pain.  Skin: Negative for rash.  Neurological: Negative for dizziness, sensory change, focal weakness,  seizures, loss of consciousness, weakness and headaches.  Endo/Heme/Allergies: Negative for polydipsia.  Psychiatric/Behavioral: Negative for depression. The patient is not nervous/anxious.     MEDICATIONS AT HOME:   Prior to Admission medications   Medication Sig Start Date End Date Taking? Authorizing Provider  apixaban (ELIQUIS) 5 MG TABS tablet Take 1 tablet (5 mg total) by mouth 2 (two) times daily. 02/26/18  Yes Lloyd Huger, MD  loperamide (IMODIUM) 2 MG capsule Take 2 mg by mouth as needed for diarrhea or loose  stools.    Yes [provider]  losartan (COZAAR) 50 MG tablet Take 1 tablet (50 mg total) by mouth daily. 11/19/17  Yes Vaughan Basta, MD  ondansetron (ZOFRAN ODT) 4 MG disintegrating tablet Take 1 tablet (4 mg total) by mouth every 8 (eight) hours as needed for nausea or vomiting. 10/17/17  Yes Earleen Newport, MD  Oxycodone HCl 10 MG TABS Take 1 tablet (10 mg total) by mouth every 6 (six) hours as needed. 02/06/18  Yes Lloyd Huger, MD  prochlorperazine (COMPAZINE) 10 MG tablet Take 1 tablet (10 mg total) by mouth every 6 (six) hours as needed (NAUSEA). 11/06/17 12/20/17  Lloyd Huger, MD      VITAL SIGNS:  Blood pressure (!) 157/72, pulse 99, temperature 98.3 F (36.8 C), temperature source Oral, resp. rate 16, height 5\' 3"  (1.6 m), weight 65.3 kg, SpO2 92 %.  PHYSICAL EXAMINATION:  Physical Exam  GENERAL:  68 y.o.-year-old patient lying in the bed with no acute distress.  EYES: Pupils equal, round, reactive to light and accommodation. No scleral icterus. Extraocular muscles intact.  HEENT: Head atraumatic, normocephalic. Oropharynx and nasopharynx clear.  NECK:  Supple, no jugular venous distention. No thyroid enlargement, no tenderness.  LUNGS: Normal breath sounds bilaterally, no wheezing, rales,rhonchi or crepitation. No use of accessory muscles of respiration.  CARDIOVASCULAR: S1, S2 normal. No murmurs, rubs, or gallops.  ABDOMEN: Soft, tenderness in middle part of the abdomen, nondistended. Bowel sounds present. No organomegaly or mass.  EXTREMITIES: No pedal edema, cyanosis, or clubbing.  NEUROLOGIC: Cranial nerves II through XII are intact. Muscle strength 5/5 in all extremities. Sensation intact. Gait not checked.  PSYCHIATRIC: The patient is alert and oriented x 3.  SKIN: No obvious rash, lesion, or ulcer.   LABORATORY PANEL:   CBC Recent Labs  Lab 03/08/18 1538  WBC 19.9*  HGB 9.8*  HCT 29.7*  PLT 341    ------------------------------------------------------------------------------------------------------------------  Chemistries  Recent Labs  Lab 03/08/18 1538  NA 141  K 3.5  CL 111  CO2 20*  GLUCOSE 124*  BUN 12  CREATININE 0.92  CALCIUM 8.7*  AST 38  ALT 23  ALKPHOS 96  BILITOT 0.8   ------------------------------------------------------------------------------------------------------------------  Cardiac Enzymes No results for input(s): TROPONINI in the last 168 hours. ------------------------------------------------------------------------------------------------------------------  RADIOLOGY:  Ct Abdomen Pelvis W Contrast  Result Date: 03/08/2018 CLINICAL DATA:  Endoscopy today with increasing nausea and bilious vomiting. Patient is currently on radiation therapy for pancreatic cancer. EXAM: CT ABDOMEN AND PELVIS WITH CONTRAST TECHNIQUE: Multidetector CT imaging of the abdomen and pelvis was performed using the standard protocol following bolus administration of intravenous contrast. CONTRAST:  14mL OMNIPAQUE IOHEXOL 300 MG/ML  SOLN COMPARISON:  February 15, 2018 FINDINGS: Lower chest: There is mild atelectasis of the lung bases. The heart size is normal. Hepatobiliary: Diffuse low density of the liver without vessel displacement is identified. No focal liver lesion is noted. Patient status post prior cholecystectomy. The intra and  extrahepatic biliary ducts are normal. Pancreas: Surgical clips with associated hypodense mass is identified in the pancreatic head consistent with patient's known pancreatic cancer. Spleen: Normal in size without focal abnormality. Adrenals/Urinary Tract: The bilateral adrenal glands are normal. The right kidney is normal. There is a heterogeneous enhancing partial cystic mass in the lateral midpole left kidney measuring 2.8 x 3 cm unchanged compared prior exam. Fluid-filled bladder is normal. Stomach/Bowel: There is hiatal hernia. There is mild  diffuse bowel wall thickening of the stomach with surrounding fluid and inflammatory change. There is no small bowel obstruction. There is diverticulosis of colon without diverticulitis. The appendix is not definitely seen. Vascular/Lymphatic: No significant vascular findings are present. No enlarged abdominal or pelvic lymph nodes. Reproductive: Uterus and bilateral adnexa are unremarkable. Other: Moderate ascites is identified in the abdomen and pelvis. There is midline umbilical herniation of mesenteric fat. There is no free air. Musculoskeletal: Degenerative joint changes of the spine are noted. IMPRESSION: Diffuse bowel wall thickening of the stomach. This is nonspecific. Finding can be due to infectious/inflammatory process. No free air. Mass in the pancreatic head consistent with patient's known pancreatic cancer. Moderate ascites in the abdomen and pelvis. Heterogeneous enhancing partial cystic mass in the lateral midpole left kidney unchanged suspicious for renal cell carcinoma. Electronically Signed   By: Abelardo Diesel M.D.   On: 03/08/2018 18:15      IMPRESSION AND PLAN:   Acute pancreatitis. Patient will be admitted to medical floor.  N.p.o. except medication, IV fluid support, pain control and follow-up lipase.  Leukocytosis.  Possible due to reaction.  Follow-up CBC.  Pancreatic cancer.  Follow-up oncologist as outpatient.  Hypertension.  Continue hypertension medication.  All the records are reviewed and case discussed with ED provider. Management plans discussed with the patient, her son and they are in agreement.  CODE STATUS: Full code.  TOTAL TIME TAKING CARE OF THIS PATIENT: 32 minutes.    Demetrios Loll M.D on 03/08/2018 at 8:40 PM  Between 7am to 6pm - Pager - [phone]  After 6pm go to [email or web address] - Proofreader  [facility]  Office  [phone]  CC: Primary care physician; Glendon Axe, MD   Note: This dictation was  prepared with Dragon dictation along with smaller phrase technology. Any transcriptional errors that result from this process are unin

## 2018-03-08 NOTE — Progress Notes (Signed)
Advanced Care Plan.  Purpose of Encounter: CODE STATUS. Parties in Attendance: The patient, her son and me. Patient's Decisional Capacity: Yes. Medical Story: Cindy Bowman  is a 68 y.o. female with a known history of pancreatic cancer diagnosed last August, hypertension and UTI.  The patient is being admitted for acute pancreatitis secondary to EGD procedure.  I discussed with patient about her current condition, prognosis and CODE STATUS.  The patient does not want to be resuscitated and intubated if she has cardiopulmonary arrest. Plan:  Code Status: Full code. Time spent discussing advance care planning: 17 minutes.

## 2018-03-09 DIAGNOSIS — D649 Anemia, unspecified: Secondary | ICD-10-CM

## 2018-03-09 DIAGNOSIS — Z9221 Personal history of antineoplastic chemotherapy: Secondary | ICD-10-CM

## 2018-03-09 LAB — LIPASE, BLOOD: Lipase: 672 U/L — ABNORMAL HIGH (ref 11–51)

## 2018-03-09 LAB — URINALYSIS, COMPLETE (UACMP) WITH MICROSCOPIC
Bilirubin Urine: NEGATIVE
Glucose, UA: NEGATIVE mg/dL
Hgb urine dipstick: NEGATIVE
Ketones, ur: 20 mg/dL — AB
Leukocytes, UA: NEGATIVE
NITRITE: NEGATIVE
PH: 5 (ref 5.0–8.0)
Protein, ur: 30 mg/dL — AB
Specific Gravity, Urine: >1.046 — ABNORMAL HIGH (ref 1.005–1.030)

## 2018-03-09 LAB — BASIC METABOLIC PANEL
Anion gap: 10 (ref 5–15)
BUN: 13 mg/dL (ref 8–23)
CO2: 18 mmol/L — ABNORMAL LOW (ref 22–32)
Calcium: 7.9 mg/dL — ABNORMAL LOW (ref 8.9–10.3)
Chloride: 112 mmol/L — ABNORMAL HIGH (ref 98–111)
Creatinine, Ser: 1.17 mg/dL — ABNORMAL HIGH (ref 0.44–1.00)
GFR calc Af Amer: 55 mL/min — ABNORMAL LOW (ref >60)
GFR calc non Af Amer: 48 mL/min — ABNORMAL LOW (ref >60)
Glucose, Bld: 166 mg/dL — ABNORMAL HIGH (ref 70–99)
POTASSIUM: 4.2 mmol/L (ref 3.5–5.1)
Sodium: 140 mmol/L (ref 135–145)

## 2018-03-09 LAB — CBC
HCT: 29.8 % — ABNORMAL LOW (ref 36.0–46.0)
HEMOGLOBIN: 9.9 g/dL — AB (ref 12.0–15.0)
MCH: 35.6 pg — ABNORMAL HIGH (ref 26.0–34.0)
MCHC: 33.2 g/dL (ref 30.0–36.0)
MCV: 107.2 fL — ABNORMAL HIGH (ref 80.0–100.0)
PLATELETS: 267 10*3/uL (ref 150–400)
RBC: 2.78 MIL/uL — ABNORMAL LOW (ref 3.87–5.11)
RDW: 21.9 % — ABNORMAL HIGH (ref 11.5–15.5)
WBC: 23.1 10*3/uL — ABNORMAL HIGH (ref 4.0–10.5)
nRBC: 0 % (ref 0.0–0.2)

## 2018-03-09 MED ORDER — LACTATED RINGERS IV SOLN
INTRAVENOUS | Status: DC
  Administered 2018-03-09 – 2018-03-10 (×4): via INTRAVENOUS

## 2018-03-09 NOTE — Progress Notes (Signed)
[place] [facility] [place] NAME: Cindy Bowman    MR#:  [id number]  DATE OF BIRTH:  Aug 22, 1950  SUBJECTIVE:  CHIEF COMPLAINT:   Chief Complaint  Patient presents with  . Emesis  . Nausea  . Abdominal Pain  Patient seen and evaluated today Has some epigastric abdominal pain Decreased nausea or vomiting No fever  REVIEW OF SYSTEMS:    ROS  CONSTITUTIONAL: No documented fever. Has fatigue, weakness. No weight gain, no weight loss.  EYES: No blurry or double vision.  ENT: No tinnitus. No postnasal drip. No redness of the oropharynx.  RESPIRATORY: No cough, no wheeze, no hemoptysis. No dyspnea.  CARDIOVASCULAR: No chest pain. No orthopnea. No palpitations. No syncope.  GASTROINTESTINAL: No nausea, no vomiting or diarrhea. Has abdominal pain. No melena or hematochezia.  GENITOURINARY: No dysuria or hematuria.  ENDOCRINE: No polyuria or nocturia. No heat or cold intolerance.  HEMATOLOGY: No anemia. No bruising. No bleeding.  INTEGUMENTARY: No rashes. No lesions.  MUSCULOSKELETAL: No arthritis. No swelling. No gout.  NEUROLOGIC: No numbness, tingling, or ataxia. No seizure-type activity.  PSYCHIATRIC: No anxiety. No insomnia. No ADD.   DRUG ALLERGIES:   Allergies  Allergen Reactions  . Nsaids Other (See Comments)    Decreased GFR  . Ibuprofen     Lowers kidney function  . Penicillins     Yeast infection  Has patient had a PCN reaction causing immediate rash, facial/tongue/throat swelling, SOB or lightheadedness with hypotension: No Has patient had a PCN reaction causing severe rash involving mucus membranes or skin necrosis: No Has patient had a PCN reaction that required hospitalization: No Has patient had a PCN reaction occurring within the last 10 years: No If all of the above answers are "NO", then may proceed with Cephalosporin use.     VITALS:  Blood pressure (!) 159/76, pulse (!) 127, temperature 98.1 F (36.7 C), temperature  source Oral, resp. rate 16, height 5\' 3"  (1.6 m), weight 65.3 kg, SpO2 98 %.  PHYSICAL EXAMINATION:   Physical Exam  GENERAL:  68 y.o.-year-old patient lying in the bed with no acute distress.  EYES: Pupils equal, round, reactive to light and accommodation. No scleral icterus. Extraocular muscles intact.  HEENT: Head atraumatic, normocephalic. Oropharynx dry and nasopharynx clear.  NECK:  Supple, no jugular venous distention. No thyroid enlargement, no tenderness.  LUNGS: Normal breath sounds bilaterally, no wheezing, rales, rhonchi. No use of accessory muscles of respiration.  CARDIOVASCULAR: S1, S2 tachycardia noted. No murmurs, rubs, or gallops.  ABDOMEN: Soft, tenderness in epigastrium noted, nondistended. Bowel sounds present. No organomegaly or mass.  EXTREMITIES: No cyanosis, clubbing or edema b/l.    NEUROLOGIC: Cranial nerves II through XII are intact. No focal Motor or sensory deficits b/l.   PSYCHIATRIC: The patient is alert and oriented x 3.  SKIN: No obvious rash, lesion, or ulcer.   LABORATORY PANEL:   CBC Recent Labs  Lab 03/09/18 0620  WBC 23.1*  HGB 9.9*  HCT 29.8*  PLT 267   ------------------------------------------------------------------------------------------------------------------ Chemistries  Recent Labs  Lab 03/08/18 1538 03/09/18 0620  NA 141 140  K 3.5 4.2  CL 111 112*  CO2 20* 18*  GLUCOSE 124* 166*  BUN 12 13  CREATININE 0.92 1.17*  CALCIUM 8.7* 7.9*  AST 38  --   ALT 23  --   ALKPHOS 96  --   BILITOT 0.8  --    ------------------------------------------------------------------------------------------------------------------  Cardiac Enzymes No results for input(s): TROPONINI  in the last 168 hours. ------------------------------------------------------------------------------------------------------------------  RADIOLOGY:  Ct Abdomen Pelvis W Contrast  Result Date: 03/08/2018 CLINICAL DATA:  Endoscopy today with increasing  nausea and bilious vomiting. Patient is currently on radiation therapy for pancreatic cancer. EXAM: CT ABDOMEN AND PELVIS WITH CONTRAST TECHNIQUE: Multidetector CT imaging of the abdomen and pelvis was performed using the standard protocol following bolus administration of intravenous contrast. CONTRAST:  21mL OMNIPAQUE IOHEXOL 300 MG/ML  SOLN COMPARISON:  February 15, 2018 FINDINGS: Lower chest: There is mild atelectasis of the lung bases. The heart size is normal. Hepatobiliary: Diffuse low density of the liver without vessel displacement is identified. No focal liver lesion is noted. Patient status post prior cholecystectomy. The intra and extrahepatic biliary ducts are normal. Pancreas: Surgical clips with associated hypodense mass is identified in the pancreatic head consistent with patient's known pancreatic cancer. Spleen: Normal in size without focal abnormality. Adrenals/Urinary Tract: The bilateral adrenal glands are normal. The right kidney is normal. There is a heterogeneous enhancing partial cystic mass in the lateral midpole left kidney measuring 2.8 x 3 cm unchanged compared prior exam. Fluid-filled bladder is normal. Stomach/Bowel: There is hiatal hernia. There is mild diffuse bowel wall thickening of the stomach with surrounding fluid and inflammatory change. There is no small bowel obstruction. There is diverticulosis of colon without diverticulitis. The appendix is not definitely seen. Vascular/Lymphatic: No significant vascular findings are present. No enlarged abdominal or pelvic lymph nodes. Reproductive: Uterus and bilateral adnexa are unremarkable. Other: Moderate ascites is identified in the abdomen and pelvis. There is midline umbilical herniation of mesenteric fat. There is no free air. Musculoskeletal: Degenerative joint changes of the spine are noted. IMPRESSION: Diffuse bowel wall thickening of the stomach. This is nonspecific. Finding can be due to infectious/inflammatory process. No  free air. Mass in the pancreatic head consistent with patient's known pancreatic cancer. Moderate ascites in the abdomen and pelvis. Heterogeneous enhancing partial cystic mass in the lateral midpole left kidney unchanged suspicious for renal cell carcinoma. Electronically Signed   By: Abelardo Diesel M.D.   On: 03/08/2018 18:15     ASSESSMENT AND PLAN:  68 year old female patient with history of pancreatic cancer, hypertension currently under hospitalist service abdominal pain nausea and vomiting  -Acute pancreatitis Continue n.p.o. IV fluid hydration Follow-up lipase level  -Pancreatic cancer Follow-up with oncology as outpatient  -Nausea and vomiting Antiemetics intravenously  -DVT prophylaxis On anticoagulation with Eliquis  All the records are reviewed and case discussed with Care Management/Social Worker. Management plans discussed with the patient, family and they are in agreement.  CODE STATUS: Full code  DVT Prophylaxis: SCDs  TOTAL TIME TAKING CARE OF THIS PATIENT: 35 minutes.   POSSIBLE D/C IN 2 to 3 DAYS, DEPENDING ON CLINICAL CONDITION.  Saundra Shelling M.D on 03/09/2018 at 1:01 PM  Between 7am to 6pm - Pager - [phone]  After 6pm go to [email or web address] - password [email or web address] [facility]  Office  [phone]  CC: Primary care physician; Glendon Axe, MD  Note: This dictation was prepared with Dragon dictation along with smaller phrase technology. Any transcriptional errors that result from this process are unintentional.

## 2018-03-09 NOTE — Consult Note (Signed)
[place]  [phone] [phone]  ID: Cindy Bowman OB: May 31, 1950  MR#: [id number]  VQQ#:[id number]  Patient Care Team: Glendon Axe, MD as PCP - General (Internal Medicine) Clent Jacks, RN as Registered Nurse Lloyd Huger, MD as Medical Oncologist (Medical Oncology)  CHIEF COMPLAINT: Pancreatic cancer, now with pancreatitis secondary to fiduciary placement.  INTERVAL HISTORY: Patient is a 68 year old female who last received chemotherapy with single agent gemcitabine for her pancreatic cancer on February 19, 2018.  She recently had fiduciary placement in preparation for SBRT resulting in acute pancreatitis.  Patient continues to have significant abdominal pain and nausea, but states is mildly improved since admission.  She has no neurologic complaints.  She denies any recent fevers.  She denies any chest pain or shortness of breath.  She has no vomiting, constipation, or diarrhea.  She has no urinary complaints.  Patient offers no further specific complaints today.  REVIEW OF SYSTEMS:   Review of Systems  Constitutional: Positive for malaise/fatigue and weight loss. Negative for fever.  Respiratory: Negative.  Negative for cough, hemoptysis and shortness of breath.   Cardiovascular: Negative.  Negative for chest pain and leg swelling.  Gastrointestinal: Positive for abdominal pain and nausea. Negative for constipation and diarrhea.  Genitourinary: Negative.  Negative for dysuria.  Musculoskeletal: Negative.  Negative for back pain.  Skin: Negative.  Negative for rash.  Neurological: Positive for weakness. Negative for focal weakness and headaches.  Psychiatric/Behavioral: Negative.  The patient is not nervous/anxious.     As per HPI. Otherwise, a complete review of systems is negative.  PAST MEDICAL HISTORY: Past Medical History:  Diagnosis Date  . Essential hypertension   . Pancreatic cancer ([place])   . UTI (urinary tract  infection)     PAST SURGICAL HISTORY: Past Surgical History:  Procedure Laterality Date  . CESAREAN SECTION    . CHOLECYSTECTOMY    . EUS N/A 10/25/2017   Procedure: FULL UPPER ENDOSCOPIC ULTRASOUND (EUS) RADIAL;  Surgeon: Jola Schmidt, MD;  Location: [facility] ENDOSCOPY;  Service: Endoscopy;  Laterality: N/A;  . OOPHORECTOMY    . PORTA CATH INSERTION N/A 11/12/2017   Procedure: PORTA CATH INSERTION;  Surgeon: Algernon Huxley, MD;  Location: [facility] CV LAB;  Service: Cardiovascular;  Laterality: N/A;  . TUBAL LIGATION      FAMILY HISTORY: Family History  Problem Relation Age of Onset  . Hypertension Mother        deceased 84  . Heart attack Father        deceased late 41s    ADVANCED DIRECTIVES (Y/N):  @ADVDIR @  HEALTH MAINTENANCE: Social History   Tobacco Use  . Smoking status: Never Smoker  . Smokeless tobacco: Never Used  Substance Use Topics  . Alcohol use: Not Currently  . Drug use: Never     Colonoscopy:  PAP:  Bone density:  Lipid panel:  Allergies  Allergen Reactions  . Nsaids Other (See Comments)    Decreased GFR  . Ibuprofen     Lowers kidney function  . Penicillins     Yeast infection  Has patient had a PCN reaction causing immediate rash, facial/tongue/throat swelling, SOB or lightheadedness with hypotension: No Has patient had a PCN reaction causing severe rash involving mucus membranes or skin necrosis: No Has patient had a PCN reaction that required hospitalization: No Has patient had a PCN reaction occurring within the last 10 years: No If all of the above answers are "NO", then may proceed with  Cephalosporin use.     Current Facility-Administered Medications  Medication Dose Route Frequency Provider Last Rate Last Dose  . acetaminophen (TYLENOL) tablet 650 mg  650 mg Oral Q6H PRN Demetrios Loll, MD       Or  . acetaminophen (TYLENOL) suppository 650 mg  650 mg Rectal Q6H PRN Demetrios Loll, MD      . albuterol (PROVENTIL) (2.5 MG/3ML) 0.083%  nebulizer solution 2.5 mg  2.5 mg Nebulization Q2H PRN Demetrios Loll, MD      . apixaban Arne Cleveland) tablet 5 mg  5 mg Oral BID Demetrios Loll, MD   5 mg at 03/09/18 8119  . bisacodyl (DULCOLAX) EC tablet 5 mg  5 mg Oral Daily PRN Demetrios Loll, MD      . HYDROcodone-acetaminophen (NORCO/VICODIN) 5-325 MG per tablet 1-2 tablet  1-2 tablet Oral Q4H PRN Demetrios Loll, MD   2 tablet at 03/08/18 2209  . HYDROmorphone (DILAUDID) injection 1 mg  1 mg Intravenous Q3H PRN Mayo, Pete Pelt, MD   1 mg at 03/09/18 1238  . lactated ringers infusion   Intravenous Continuous Pyreddy, Pavan, MD      . loperamide (IMODIUM) capsule 2 mg  2 mg Oral PRN Demetrios Loll, MD      . losartan (COZAAR) tablet 50 mg  50 mg Oral Daily Demetrios Loll, MD   50 mg at 03/09/18 1478  . ondansetron (ZOFRAN) tablet 4 mg  4 mg Oral Q6H PRN Demetrios Loll, MD       Or  . ondansetron [facility]) injection 4 mg  4 mg Intravenous Q6H PRN Demetrios Loll, MD   4 mg at 03/08/18 2211  . senna-docusate (Senokot-S) tablet 1 tablet  1 tablet Oral QHS PRN Demetrios Loll, MD      . sodium chloride flush (NS) 0.9 % injection 3 mL  3 mL Intravenous Once Lavonia Drafts, MD       Facility-Administered Medications Ordered in Other Encounters  Medication Dose Route Frequency Provider Last Rate Last Dose  . prochlorperazine (COMPAZINE) injection 10 mg  10 mg Intravenous Once Rexene Agent        OBJECTIVE: Vitals:   03/09/18 0805 03/09/18 1332  BP: (!) 159/76 (!) 155/64  Pulse: (!) 127 (!) 130  Resp:  20  Temp: 98.1 F (36.7 C) 98.6 F (37 C)  SpO2: 98% 100%     Body mass index is 25.51 kg/m.    ECOG FS:2 - Symptomatic, <50% confined to bed  General: Well-developed, well-nourished, mild distress secondary to abdominal pain. Eyes: Pink conjunctiva, anicteric sclera. HEENT: Normocephalic, moist mucous membranes, clear oropharnyx. Lungs: Clear to auscultation bilaterally. Heart: Regular rate and rhythm. No rubs, murmurs, or gallops. Abdomen: Mild tenderness to  palpation. Musculoskeletal: No edema, cyanosis, or clubbing. Neuro: Alert, answering all questions appropriately. Cranial nerves grossly intact. Skin: No rashes or petechiae noted. Psych: Normal affect.   LAB RESULTS:  Lab Results  Component Value Date   NA 140 03/09/2018   K 4.2 03/09/2018   CL 112 (H) 03/09/2018   CO2 18 (L) 03/09/2018   GLUCOSE 166 (H) 03/09/2018   BUN 13 03/09/2018   CREATININE 1.17 (H) 03/09/2018   CALCIUM 7.9 (L) 03/09/2018   PROT 7.1 03/08/2018   ALBUMIN 3.2 (L) 03/08/2018   AST 38 03/08/2018   ALT 23 03/08/2018   ALKPHOS 96 03/08/2018   BILITOT 0.8 03/08/2018   GFRNONAA 48 (L) 03/09/2018   GFRAA 55 (L) 03/09/2018    Lab Results  Component Value  Date   WBC 23.1 (H) 03/09/2018   NEUTROABS 4.3 02/26/2018   HGB 9.9 (L) 03/09/2018   HCT 29.8 (L) 03/09/2018   MCV 107.2 (H) 03/09/2018   PLT 267 03/09/2018     STUDIES: Ct Abdomen Pelvis W Contrast  Result Date: 03/08/2018 CLINICAL DATA:  Endoscopy today with increasing nausea and bilious vomiting. Patient is currently on radiation therapy for pancreatic cancer. EXAM: CT ABDOMEN AND PELVIS WITH CONTRAST TECHNIQUE: Multidetector CT imaging of the abdomen and pelvis was performed using the standard protocol following bolus administration of intravenous contrast. CONTRAST:  55mL OMNIPAQUE IOHEXOL 300 MG/ML  SOLN COMPARISON:  February 15, 2018 FINDINGS: Lower chest: There is mild atelectasis of the lung bases. The heart size is normal. Hepatobiliary: Diffuse low density of the liver without vessel displacement is identified. No focal liver lesion is noted. Patient status post prior cholecystectomy. The intra and extrahepatic biliary ducts are normal. Pancreas: Surgical clips with associated hypodense mass is identified in the pancreatic head consistent with patient's known pancreatic cancer. Spleen: Normal in size without focal abnormality. Adrenals/Urinary Tract: The bilateral adrenal glands are normal. The  right kidney is normal. There is a heterogeneous enhancing partial cystic mass in the lateral midpole left kidney measuring 2.8 x 3 cm unchanged compared prior exam. Fluid-filled bladder is normal. Stomach/Bowel: There is hiatal hernia. There is mild diffuse bowel wall thickening of the stomach with surrounding fluid and inflammatory change. There is no small bowel obstruction. There is diverticulosis of colon without diverticulitis. The appendix is not definitely seen. Vascular/Lymphatic: No significant vascular findings are present. No enlarged abdominal or pelvic lymph nodes. Reproductive: Uterus and bilateral adnexa are unremarkable. Other: Moderate ascites is identified in the abdomen and pelvis. There is midline umbilical herniation of mesenteric fat. There is no free air. Musculoskeletal: Degenerative joint changes of the spine are noted. IMPRESSION: Diffuse bowel wall thickening of the stomach. This is nonspecific. Finding can be due to infectious/inflammatory process. No free air. Mass in the pancreatic head consistent with patient's known pancreatic cancer. Moderate ascites in the abdomen and pelvis. Heterogeneous enhancing partial cystic mass in the lateral midpole left kidney unchanged suspicious for renal cell carcinoma. Electronically Signed   By: Abelardo Diesel M.D.   On: 03/08/2018 18:15   Ct Abdomen Pelvis W Contrast  Result Date: 02/15/2018 CLINICAL DATA:  68 year old female for follow-up/restaging of pancreatic malignancy. Currently undergoing chemotherapy. EXAM: CT ABDOMEN AND PELVIS WITH CONTRAST TECHNIQUE: Multidetector CT imaging of the abdomen and pelvis was performed using the standard protocol following bolus administration of intravenous contrast. CONTRAST:  19mL ISOVUE-300 IOPAMIDOL (ISOVUE-300) INJECTION 61% COMPARISON:  12/12/2017 CT and prior studies FINDINGS: Lower chest: No acute or new abnormalities. Hepatobiliary: Hepatic steatosis again noted. No focal hepatic lesions are  present. Patient is status post cholecystectomy. No biliary dilatation. Pancreas: Pancreatic uncinate process mass is unchanged in size measuring 2.5 x 2.7 cm (series 2: Image 26). Contact with the SMA and SMV again noted. The remainder of the pancreas is unremarkable. Spleen: Unremarkable Adrenals/Urinary Tract: A 3 cm enhancing solid and cystic mass involving the LATERAL LEFT mid kidney is unchanged. There is no evidence of hydronephrosis or new renal abnormality. The adrenal glands are unremarkable. Bladder unremarkable with known bladder diverticulum difficult to visualized on this exam. Stomach/Bowel: No bowel wall thickening, bowel obstruction or inflammatory changes. Colonic diverticulosis noted without evidence of diverticulitis. Vascular/Lymphatic: No significant vascular findings are present. No enlarged abdominal or pelvic lymph nodes. Reproductive: Uterus and bilateral adnexa are  unremarkable. Other: No ascites or peritoneal/omental abnormality. A moderate supraumbilical ventral hernia containing fat is unchanged. No pneumoperitoneum or focal collection/abscess. Musculoskeletal: No acute or suspicious bony abnormalities. IMPRESSION: 1. No significant change in 2.7 cm pancreatic uncinate process mass since 12/12/2017. No evidence of distant metastases. 2. Unchanged 3 cm LEFT renal mass highly suspicious for renal cell carcinoma. 3. No new or significant changes from the prior study. Electronically Signed   By: Margarette Canada M.D.   On: 02/15/2018 09:45    ASSESSMENT: Pancreatic cancer, now with pancreatitis secondary to fiduciary placement.  PLAN:     1.  Pancreatic cancer: Patient's last chemotherapy with single agent gemcitabine was on February 19, 2018.  No further chemotherapy is planned at this time until patient completes SBRT to her pancreatic lesion.  She has an appointment in [place] on March 12, 2018 for simulation and then treatment will begin 1 week later.  Patient has been instructed  to keep these follow-up appointments as scheduled. 2.  Acute pancreatitis: Secondary to fiduciary placement.  Patient's lipase has trended down significantly, although she remains symptomatic.  Continue supportive care with pain medications and IV fluids. 3.  Leukocytosis: Likely reactive, monitor. 4.  Anemia: Patient's hemoglobin is decreased, but essentially unchanged.  Monitor.  Appreciate consult, call with questions.  Cancer Staging Pancreatic adenocarcinoma [facility]) Staging form: Exocrine Pancreas, AJCC 8th Edition - Clinical stage from 10/26/2017: Stage IB (cT2, cN0, cM0) - Signed by Lloyd Huger, MD on 10/26/2017   Lloyd Huger, MD   03/09/2018 2:21 PM

## 2018-03-09 NOTE — Plan of Care (Signed)
  Problem: Education: Goal: Knowledge of General Education information will improve Description Including pain rating scale, medication(s)/side effects and non-pharmacologic comfort measures Outcome: Progressing   Problem: Health Behavior/Discharge Planning: Goal: Ability to manage health-related needs will improve Outcome: Progressing   Problem: Clinical Measurements: Goal: Ability to maintain clinical measurements within normal limits will improve Outcome: Progressing Goal: Will remain free from infection Outcome: Progressing Goal: Diagnostic test results will improve Outcome: Progressing Goal: Respiratory complications will improve Outcome: Progressing Goal: Cardiovascular complication will be avoided Outcome: Progressing   Problem: Activity: Goal: Risk for activity intolerance will decrease Outcome: Progressing   Problem: Nutrition: Goal: Adequate nutrition will be maintained Outcome: Not Progressing   Problem: Coping: Goal: Level of anxiety will decrease Outcome: Progressing   Problem: Elimination: Goal: Will not experience complications related to bowel motility Outcome: Progressing Goal: Will not experience complications related to urinary retention Outcome: Progressing   Problem: Pain Managment: Goal: General experience of comfort will improve Outcome: Progressing   Problem: Safety: Goal: Ability to remain free from injury will improve Outcome: Progressing   Problem: Education: Goal: Knowledge of Pancreatitis treatment and prevention will improve Outcome: Progressing   Problem: Health Behavior/Discharge Planning: Goal: Ability to formulate a plan to maintain an alcohol-free life will improve Outcome: Progressing   Problem: Nutritional: Goal: Ability to achieve adequate nutritional intake will improve Outcome: Progressing   Problem: Clinical Measurements: Goal: Complications related to the disease process, condition or treatment will be avoided or  minimized Outcome: Progressing

## 2018-03-09 NOTE — Progress Notes (Signed)
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 Patient is getting PRN medicine of Norco 1-2 tabs q 4 for moderate pain and Dilaudid 1mg  q 3 for severe pain. Noted that patient wishes to remain pain free at all times. Advised patient that since pain medicines are PRN, to let this RN know whenever she is starting to have abdominal pain so she can be medicated. Discussed with Dr Bridgett Larsson the option of an extended release pain medicine, he told me to let the morning team decide on that. Patient remains comfortable and pain free when asked. Will continue to monitor.

## 2018-03-10 ENCOUNTER — Encounter: Payer: Self-pay | Admitting: Radiology

## 2018-03-10 ENCOUNTER — Inpatient Hospital Stay: Payer: Medicare Other

## 2018-03-10 DIAGNOSIS — K859 Acute pancreatitis without necrosis or infection, unspecified: Secondary | ICD-10-CM

## 2018-03-10 DIAGNOSIS — C259 Malignant neoplasm of pancreas, unspecified: Secondary | ICD-10-CM

## 2018-03-10 DIAGNOSIS — E43 Unspecified severe protein-calorie malnutrition: Secondary | ICD-10-CM

## 2018-03-10 LAB — CBC
HCT: 25.7 % — ABNORMAL LOW (ref 36.0–46.0)
HEMATOCRIT: 26.8 % — AB (ref 36.0–46.0)
Hemoglobin: 8.8 g/dL — ABNORMAL LOW (ref 12.0–15.0)
Hemoglobin: 8.4 g/dL — ABNORMAL LOW (ref 12.0–15.0)
MCH: 35.3 pg — ABNORMAL HIGH (ref 26.0–34.0)
MCH: 35.4 pg — ABNORMAL HIGH (ref 26.0–34.0)
MCHC: 32.8 g/dL (ref 30.0–36.0)
MCHC: 32.7 g/dL (ref 30.0–36.0)
MCV: 107.6 fL — ABNORMAL HIGH (ref 80.0–100.0)
MCV: 108.4 fL — ABNORMAL HIGH (ref 80.0–100.0)
Platelets: 276 10*3/uL (ref 150–400)
Platelets: 220 10*3/uL (ref 150–400)
RBC: 2.49 MIL/uL — ABNORMAL LOW (ref 3.87–5.11)
RBC: 2.37 MIL/uL — ABNORMAL LOW (ref 3.87–5.11)
RDW: 22.2 % — ABNORMAL HIGH (ref 11.5–15.5)
RDW: 22.3 % — ABNORMAL HIGH (ref 11.5–15.5)
WBC: 30.5 10*3/uL — ABNORMAL HIGH (ref 4.0–10.5)
WBC: 28.8 10*3/uL — ABNORMAL HIGH (ref 4.0–10.5)
nRBC: 0 % (ref 0.0–0.2)
nRBC: 0 % (ref 0.0–0.2)

## 2018-03-10 LAB — BASIC METABOLIC PANEL
Anion gap: 6 (ref 5–15)
BUN: 21 mg/dL (ref 8–23)
CO2: 21 mmol/L — ABNORMAL LOW (ref 22–32)
Calcium: 8 mg/dL — ABNORMAL LOW (ref 8.9–10.3)
Chloride: 114 mmol/L — ABNORMAL HIGH (ref 98–111)
Creatinine, Ser: 1.42 mg/dL — ABNORMAL HIGH (ref 0.44–1.00)
GFR calc Af Amer: 44 mL/min — ABNORMAL LOW (ref >60)
GFR calc non Af Amer: 38 mL/min — ABNORMAL LOW (ref >60)
Glucose, Bld: 116 mg/dL — ABNORMAL HIGH (ref 70–99)
Potassium: 3.8 mmol/L (ref 3.5–5.1)
Sodium: 141 mmol/L (ref 135–145)

## 2018-03-10 LAB — MRSA PCR SCREENING: MRSA by PCR: NEGATIVE

## 2018-03-10 LAB — PROCALCITONIN: PROCALCITONIN: 136.57 ng/mL

## 2018-03-10 LAB — GLUCOSE, CAPILLARY: Glucose-Capillary: 97 mg/dL (ref 70–99)

## 2018-03-10 LAB — LIPASE, BLOOD: Lipase: 602 U/L — ABNORMAL HIGH (ref 11–51)

## 2018-03-10 LAB — LACTIC ACID, PLASMA: LACTIC ACID, VENOUS: 0.8 mmol/L (ref 0.5–1.9)

## 2018-03-10 MED ORDER — IOHEXOL 300 MG/ML  SOLN
75.0000 mL | Freq: Once | INTRAMUSCULAR | Status: AC | PRN
  Administered 2018-03-10: 75 mL via INTRAVENOUS

## 2018-03-10 MED ORDER — SODIUM CHLORIDE 0.9 % IV SOLN
2.0000 g | Freq: Two times a day (BID) | INTRAVENOUS | Status: DC
  Administered 2018-03-10 (×2): 2 g via INTRAVENOUS
  Filled 2018-03-10 (×3): qty 2

## 2018-03-10 MED ORDER — HYDROMORPHONE HCL 1 MG/ML IJ SOLN
1.0000 mg | Freq: Once | INTRAMUSCULAR | Status: AC
  Administered 2018-03-10: 1 mg via INTRAVENOUS

## 2018-03-10 MED ORDER — VANCOMYCIN HCL IN DEXTROSE 750-5 MG/150ML-% IV SOLN
750.0000 mg | INTRAVENOUS | Status: DC
  Filled 2018-03-10: qty 150

## 2018-03-10 MED ORDER — VANCOMYCIN HCL 10 G IV SOLR
1250.0000 mg | Freq: Once | INTRAVENOUS | Status: AC
  Administered 2018-03-10: 1250 mg via INTRAVENOUS
  Filled 2018-03-10: qty 1250

## 2018-03-10 NOTE — Progress Notes (Signed)
Spoke with Barnetta Chapel from [facility]. Patient has been assigned a bed Room # 9329. Maggie, NP ,charge RN notified and patient notified of transfer with a bed assignment.

## 2018-03-10 NOTE — Progress Notes (Addendum)
[facility] [place] NAME: Cindy Bowman    MR#:  [id number]  DATE OF BIRTH:  Jul 06, 1950  SUBJECTIVE:  CHIEF COMPLAINT:   Chief Complaint  Patient presents with  . Emesis  . Nausea  . Abdominal Pain  Patient seen and evaluated today Has epigastric abdominal pain Decreased nausea or vomiting CT abdomen was repeated again today Patient has elevated WBC count No fever No rectal bleed No hematemesis  REVIEW OF SYSTEMS:    ROS  CONSTITUTIONAL: No documented fever. Has fatigue, weakness. No weight gain, no weight loss.  EYES: No blurry or double vision.  ENT: No tinnitus. No postnasal drip. No redness of the oropharynx.  RESPIRATORY: No cough, no wheeze, no hemoptysis. No dyspnea.  CARDIOVASCULAR: No chest pain. No orthopnea. No palpitations. No syncope.  GASTROINTESTINAL: No nausea, no vomiting or diarrhea. Has abdominal pain. No melena or hematochezia.  GENITOURINARY: No dysuria or hematuria.  ENDOCRINE: No polyuria or nocturia. No heat or cold intolerance.  HEMATOLOGY: No anemia. No bruising. No bleeding.  INTEGUMENTARY: No rashes. No lesions.  MUSCULOSKELETAL: No arthritis. No swelling. No gout.  NEUROLOGIC: No numbness, tingling, or ataxia. No seizure-type activity.  PSYCHIATRIC: No anxiety. No insomnia. No ADD.   DRUG ALLERGIES:   Allergies  Allergen Reactions  . Nsaids Other (See Comments)    Decreased GFR  . Ibuprofen     Lowers kidney function  . Penicillins     Yeast infection  Has patient had a PCN reaction causing immediate rash, facial/tongue/throat swelling, SOB or lightheadedness with hypotension: No Has patient had a PCN reaction causing severe rash involving mucus membranes or skin necrosis: No Has patient had a PCN reaction that required hospitalization: No Has patient had a PCN reaction occurring within the last 10 years: No If all of the above answers are "NO", then may proceed with Cephalosporin use.      VITALS:  Blood pressure (!) 147/66, pulse (!) 114, temperature 99.2 F (37.3 C), temperature source Oral, resp. rate 18, height 5\' 3"  (1.6 m), weight 65.3 kg, SpO2 96 %.  PHYSICAL EXAMINATION:   Physical Exam  GENERAL:  68 y.o.-year-old patient lying in the bed with no acute distress.  EYES: Pupils equal, round, reactive to light and accommodation. No scleral icterus. Extraocular muscles intact.  HEENT: Head atraumatic, normocephalic. Oropharynx dry and nasopharynx clear.  NECK:  Supple, no jugular venous distention. No thyroid enlargement, no tenderness.  LUNGS: Normal breath sounds bilaterally, no wheezing, rales, rhonchi. No use of accessory muscles of respiration.  CARDIOVASCULAR: S1, S2 tachycardia noted. No murmurs, rubs, or gallops.  ABDOMEN: Soft, tenderness in epigastrium noted, nondistended. Bowel sounds decreased. No organomegaly noted EXTREMITIES: No cyanosis, clubbing or edema b/l.    NEUROLOGIC: Cranial nerves II through XII are intact. No focal Motor or sensory deficits b/l.   PSYCHIATRIC: The patient is alert and oriented x 3.  SKIN: No obvious rash, lesion, or ulcer.   LABORATORY PANEL:   CBC Recent Labs  Lab 03/10/18 0415  WBC 30.5*  HGB 8.8*  HCT 26.8*  PLT 276   ------------------------------------------------------------------------------------------------------------------ Chemistries  Recent Labs  Lab 03/08/18 1538  03/10/18 0415  NA 141   < > 141  K 3.5   < > 3.8  CL 111   < > 114*  CO2 20*   < > 21*  GLUCOSE 124*   < > 116*  BUN 12   < > 21  CREATININE 0.92   < >  1.42*  CALCIUM 8.7*   < > 8.0*  AST 38  --   --   ALT 23  --   --   ALKPHOS 96  --   --   BILITOT 0.8  --   --    < > = values in this interval not displayed.   ------------------------------------------------------------------------------------------------------------------  Cardiac Enzymes No results for input(s): TROPONINI in the last 168  hours. ------------------------------------------------------------------------------------------------------------------  RADIOLOGY:  Ct Abdomen Pelvis W Contrast  Result Date: 03/10/2018 CLINICAL DATA:  Inpatient. 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 Upper endoscopy with placement of radiation fiducial markers on 03/08/2018, now with nausea, vomiting and abdominal pain. EXAM: CT ABDOMEN AND PELVIS WITH CONTRAST TECHNIQUE: Multidetector CT imaging of the abdomen and pelvis was performed using the standard protocol following bolus administration of intravenous contrast. CONTRAST:  80mL OMNIPAQUE IOHEXOL 300 MG/ML  SOLN COMPARISON:  03/08/2018 CT abdomen/pelvis. FINDINGS: Lower chest: Small dependent bilateral pleural effusions, left greater than right, new. Moderate bibasilar atelectasis. Tip of superior approach central venous catheter is seen near the cavoatrial junction. Hepatobiliary: Diffuse hepatic steatosis. No liver masses. Cholecystectomy. No biliary ductal dilatation. Pancreas: Hypoenhancing 2.3 x 2.1 cm uncinate process pancreatic mass (series 2/image 31), unchanged. Fiducial markers are again noted along right margin of the pancreatic mass. Diffuse thickening of the pancreas with prominent peripancreatic edema compatible with acute pancreatitis. There is non enhancement of the pancreatic parenchyma throughout the pancreatic body and much of the tail, compatible with necrotizing pancreatitis. No appreciable pancreatic duct dilation. Spleen: Normal size. No mass. Adrenals/Urinary Tract: Normal adrenals. Heterogeneously avidly enhancing exophytic 3.1 cm renal cortical mass in the lateral interpolar left kidney, unchanged. No hydronephrosis. Normal bladder. Right periurethral 2.1 x 1.2 cm diverticulum now containing excreted contrast (series 2/image 80), unchanged. Stomach/Bowel: Stomach is nondistended. There is fold thickening throughout fundus and body  of the stomach, not definitely changed. Normal caliber small bowel with no small bowel wall thickening. Normal appendix. Moderate sigmoid diverticulosis, with no definite large bowel wall thickening. Vascular/Lymphatic: Normal caliber abdominal aorta. Patent hepatic and renal veins with no renal vein tumor thrombus. Stable at least moderate narrowing of portal splenic venous confluence by the mass. Reproductive: Grossly normal uterus.  No adnexal mass. Other: No pneumoperitoneum. Stable moderate periumbilical fat containing hernia. Small to moderate volume ascites is increased. Retroperitoneal fluid in left greater than right anterior paranephric spaces is mildly decreased. New mild anasarca. No focal measurable fluid collections. Musculoskeletal: No aggressive appearing focal osseous lesions. Moderate thoracolumbar spondylosis. IMPRESSION: 1. Severe acute necrotizing pancreatitis with edema extending into the anterior paranephric retroperitoneal spaces bilaterally. Absence of pancreatic parenchymal enhancement throughout the pancreatic body and much of the pancreatic tail. 2. Pancreatic neoplasm in the uncinate process is unchanged. 3. Worsening third-spacing of fluid with new small dependent bilateral pleural effusions, new mild anasarca and increased small to moderate volume ascites. 4. Fold thickening throughout stomach is nonspecific and probably reactive or due to third spacing of fluid. 5. Stable avidly enhancing exophytic 3.1 cm lateral interpolar left renal mass compatible with renal cell carcinoma. 6. No findings of metastatic disease in the abdomen or pelvis. 7. Diffuse hepatic steatosis. 8. Stable right urethral diverticulum. Electronically Signed   By: Ilona Sorrel M.D.   On: 03/10/2018 11:56   Ct Abdomen Pelvis W Contrast  Result Date: 03/08/2018 CLINICAL DATA:  Endoscopy today with increasing nausea and bilious vomiting. Patient is currently on radiation therapy for pancreatic cancer. EXAM: CT  ABDOMEN AND  PELVIS WITH CONTRAST TECHNIQUE: Multidetector CT imaging of the abdomen and pelvis was performed using the standard protocol following bolus administration of intravenous contrast. CONTRAST:  29mL OMNIPAQUE IOHEXOL 300 MG/ML  SOLN COMPARISON:  February 15, 2018 FINDINGS: Lower chest: There is mild atelectasis of the lung bases. The heart size is normal. Hepatobiliary: Diffuse low density of the liver without vessel displacement is identified. No focal liver lesion is noted. Patient status post prior cholecystectomy. The intra and extrahepatic biliary ducts are normal. Pancreas: Surgical clips with associated hypodense mass is identified in the pancreatic head consistent with patient's known pancreatic cancer. Spleen: Normal in size without focal abnormality. Adrenals/Urinary Tract: The bilateral adrenal glands are normal. The right kidney is normal. There is a heterogeneous enhancing partial cystic mass in the lateral midpole left kidney measuring 2.8 x 3 cm unchanged compared prior exam. Fluid-filled bladder is normal. Stomach/Bowel: There is hiatal hernia. There is mild diffuse bowel wall thickening of the stomach with surrounding fluid and inflammatory change. There is no small bowel obstruction. There is diverticulosis of colon without diverticulitis. The appendix is not definitely seen. Vascular/Lymphatic: No significant vascular findings are present. No enlarged abdominal or pelvic lymph nodes. Reproductive: Uterus and bilateral adnexa are unremarkable. Other: Moderate ascites is identified in the abdomen and pelvis. There is midline umbilical herniation of mesenteric fat. There is no free air. Musculoskeletal: Degenerative joint changes of the spine are noted. IMPRESSION: Diffuse bowel wall thickening of the stomach. This is nonspecific. Finding can be due to infectious/inflammatory process. No free air. Mass in the pancreatic head consistent with patient's known pancreatic cancer. Moderate  ascites in the abdomen and pelvis. Heterogeneous enhancing partial cystic mass in the lateral midpole left kidney unchanged suspicious for renal cell carcinoma. Electronically Signed   By: Abelardo Diesel M.D.   On: 03/08/2018 18:15   Dg Chest Port 1 View  Result Date: 03/10/2018 CLINICAL DATA:  68 year old with pneumonia. EXAM: PORTABLE CHEST 1 VIEW COMPARISON:  PET-CT 10/30/2017 FINDINGS: New patchy densities at the left lung base and difficult to exclude small left pleural effusion. Few densities along the medial right lung base. Upper lungs are clear. Heart size is within normal limits. Right jugular Port-A-Cath is present. Catheter tip is near the SVC and right atrium junction. Negative for a pneumothorax. Surgical clips in the right upper abdomen. IMPRESSION: Bibasilar chest densities, left side greater than right. Findings concerning for infection, particularly on the left side. Can not exclude small left pleural effusion. Electronically Signed   By: Markus Daft M.D.   On: 03/10/2018 09:06     ASSESSMENT AND PLAN:  68 year old female patient with history of pancreatic cancer, hypertension currently under hospitalist service abdominal pain nausea and vomiting  -Acute severe necrotizing pancreatitis Discussed with gastroenterology on-call Status post gastroenterology and surgery follow-up As per gastroenterology recommendations start clear liquid diet Aggressive IV fluid hydration Transfer patient to ICU Follow-up lipase level Possible transfer to tertiary hospital for debridement Called [facility] to transfer patient to [place] callback.  -Abdominal pain secondary to pancreatitis Pain management with IV Dilaudid  -Pancreatic cancer Follow-up with oncology  Discussed with oncology Patient had recent endoscopic ultrasound and fidiuciaries placed for radiation therapy  -Nausea and vomiting Antiemetics intravenously  -Worsening leukocytosis Blood cultures and  urine culture Patient started on broad-spectrum IV antibiotics Worsening leukocytosis probably secondary to necrotizing pancreatitis  -DVT prophylaxis Sequential compression device to lower extremities  All the records are reviewed and case discussed with Care  Management/Social Worker. Management plans discussed with the patient, family and they are in agreement.  CODE STATUS: Full code  DVT Prophylaxis: SCDs  TOTAL CRITICAL CARE TIME TAKING CARE OF THIS PATIENT: 52 minutes.   POSSIBLE D/C IN 2 to 3 DAYS, DEPENDING ON CLINICAL CONDITION.  Saundra Shelling M.D on 03/10/2018 at 12:36 PM  Between 7am to 6pm - Pager - [phone]  After 6pm go to [email or web address] - password [email or web address] [facility]  Office  [phone]  CC: Primary care physician; Glendon Axe, MD  Note: This dictation was prepared with Dragon dictation along with smaller phrase technology. Any transcriptional errors that result from this process are unintentional.

## 2018-03-10 NOTE — Consult Note (Signed)
Pharmacy Antibiotic Note  Cindy Bowman is a 68 y.o. female admitted on 03/08/2018 with emesis, N/V and abdominal pain. She has a h/o pancreatic cancer diagnosed last August, HTN and UTI.  The patient had EGD done in [facility] with placement of radiation markers on 1/17. She developed abdominal pain which was worsening during the day. Since admission she has worsened and now meets sepsis criteria with the origin uncertain. Pharmacy has been consulted for vancomycin and cefepime dosing. CXR, urine and blood cultures are ordered. SCr noted to be rising  Plan: 1) Vancomycin 1250 mg loading dose then 750 mg IV Q 24 hrs. Goal AUC 400-550. Expected AUC: 524.2 SCr used: 1.42  2) begin cefepime 2 grams IV every 12 hours   Height: 5\' 3"  (160 cm) Weight: 144 lb (65.3 kg) IBW/kg (Calculated) : 52.4  Temp (24hrs), Avg:98.9 F (37.2 C), Min:98.6 F (37 C), Max:99.4 F (37.4 C)  Recent Labs  Lab 03/08/18 1538 03/09/18 0620 03/10/18 0415  WBC 19.9* 23.1* 30.5*  CREATININE 0.92 1.17* 1.42*    Estimated Creatinine Clearance: 34.5 mL/min (A) (by C-G formula based on SCr of 1.42 mg/dL (H)).    Antimicrobials this admission: vancomycin 1/19 >>  cefepime 1/19 >>   Microbiology results: 1/19 BCx: pending 1/19 UCx: pending   Thank you for allowing pharmacy to be a part of this patient's care.  Dallie Piles, PharmD 03/10/2018 8:38 AM

## 2018-03-10 NOTE — Discharge Summary (Signed)
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 Essential hypertension   . Pancreatic cancer ([place])   . UTI (urinary tract infection)      ADMITTING HISTORY Cindy Bowman  is a 68 y.o. female with a known history of pancreatic cancer diagnosed last August, hypertension and UTI.  The patient had EGD done in [facility] with placement of radiation markers today.  She developed abdominal pain which has been worsening during the day, associated with nausea no vomiting.  She denies any fever or chills, no diarrhea melena or bloody stool.  Abdominal pain is in the middle of part of the abdomen, constant, 8 out of 10 without radiation.  Lipase is elevated at 2,107.  HOSPITAL COURSE:  Patient was admitted to medical floor.  Patient was kept n.p.o. and aggressively started on IV fluids.  Lipase level at the time of admission was 2107.  CT abdomen at the time of admission showed pancreatic head mass with stomach wall thickening.  Patient's WBC count started rising during hospitalization.  She had abdominal discomfort which was managed with IV pain medication.  IV antiemetic medications were given.  Aggressive IV fluid resuscitation was done.  Surgery and GI consultations were done.  Patient moved to the ICU.  Repeat CT abdomen showed severe necrotizing pancreatitis.  Patient started on IV broad-spectrum vancomycin and cefepime antibiotics.  Pancultures done.  Discussed with  gastroenterology who recommended patient to be transferred to tertiary hospital for further debridement of pancreas.  CONSULTS OBTAINED:  Treatment Team:  Lucilla Lame, MD Benjamine Sprague, DO Rosine Door, MD Lloyd Huger, MD  DRUG ALLERGIES:   Allergies  Allergen Reactions  . Nsaids Other (See Comments)    Decreased GFR  . Ibuprofen     Lowers kidney function  . Penicillins     Yeast infection  Has patient had a PCN reaction causing immediate rash, facial/tongue/throat swelling, SOB or lightheadedness with hypotension: No Has patient had a PCN reaction causing severe rash involving mucus membranes or skin necrosis: No Has patient had a PCN reaction that required hospitalization: No Has patient had a PCN reaction occurring within the last 10 years: No If all of the above answers are "NO", then may proceed with Cephalosporin use.     DISCHARGE MEDICATIONS:   Allergies as of 03/10/2018      Reactions   Nsaids Other (See Comments)   Decreased GFR   Ibuprofen    Lowers kidney function   Penicillins    Yeast infection Has patient had a PCN reaction causing immediate rash, facial/tongue/throat swelling, SOB or lightheadedness with hypotension: No Has patient had a PCN reaction causing severe rash involving mucus membranes or skin necrosis: No Has patient had a PCN reaction that required hospitalization: No Has patient had a PCN reaction occurring within the last 10 years: No If all of the above answers are "NO", then may proceed with Cephalosporin use.      Medication List    TAKE these medications   apixaban 5  MG Tabs tablet Commonly known as:  ELIQUIS Take 1 tablet (5 mg total) by mouth 2 (two) times daily.   loperamide 2 MG capsule Commonly known as:  IMODIUM Take 2 mg by mouth as needed for diarrhea or loose stools.   losartan 50 MG tablet Commonly known as:  COZAAR Take 1 tablet (50 mg total) by mouth daily.   ondansetron 4 MG disintegrating  tablet Commonly known as:  ZOFRAN ODT Take 1 tablet (4 mg total) by mouth every 8 (eight) hours as needed for nausea or vomiting.   Oxycodone HCl 10 MG Tabs Take 1 tablet (10 mg total) by mouth every 6 (six) hours as needed.       Today  Patient seen today Has abdominal pain No fever No vomiting VITAL SIGNS:  Blood pressure (!) 150/62, pulse (!) 110, temperature 99.1 F (37.3 C), temperature source Oral, resp. rate (!) 24, height 5\' 3"  (1.6 m), weight 67.3 kg, SpO2 100 %.  I/O:    Intake/Output Summary (Last 24 hours) at 03/10/2018 2120 Last data filed at 03/10/2018 1800 Gross per 24 hour  Intake 1264.23 ml  Output 100 ml  Net 1164.23 ml    PHYSICAL EXAMINATION:  Physical Exam  GENERAL:  68 y.o.-year-old patient lying in the bed  LUNGS: Normal breath sounds bilaterally, no wheezing, rales,rhonchi or crepitation. No use of accessory muscles of respiration.  CARDIOVASCULAR: S1, S2 normal. No murmurs, rubs, or gallops.  ABDOMEN: Soft, tenderness in epigastrium, non-distended. Bowel sounds decreased. No organomegaly or mass.  NEUROLOGIC: Moves all 4 extremities. PSYCHIATRIC: The patient is alert and oriented x 3.  SKIN: No obvious rash, lesion, or ulcer.   DATA REVIEW:   CBC Recent Labs  Lab 03/10/18 1544  WBC 28.8*  HGB 8.4*  HCT 25.7*  PLT 220    Chemistries  Recent Labs  Lab 03/08/18 1538  03/10/18 0415  NA 141   < > 141  K 3.5   < > 3.8  CL 111   < > 114*  CO2 20*   < > 21*  GLUCOSE 124*   < > 116*  BUN 12   < > 21  CREATININE 0.92   < > 1.42*  CALCIUM 8.7*   < > 8.0*  AST 38  --   --   ALT 23  --   --   ALKPHOS 96  --   --   BILITOT 0.8  --   --    < > = values in this interval not displayed.    Cardiac Enzymes No results for input(s): TROPONINI in the last 168 hours.  Microbiology Results  Results for orders placed or performed during the hospital encounter of 03/08/18  MRSA PCR Screening     Status: None   Collection Time: 03/10/18  1:57  PM  Result Value Ref Range Status   MRSA by PCR NEGATIVE NEGATIVE Final    Comment:        The GeneXpert MRSA Assay (FDA approved for NASAL specimens only), is one component of a comprehensive MRSA colonization surveillance program. It is not intended to diagnose MRSA infection nor to guide or monitor treatment for MRSA infections. Performed at [facility], [address], [place], [place] [address]     RADIOLOGY:  Ct Abdomen Pelvis W Contrast  Result Date: 03/10/2018 CLINICAL DATA:  Inpatient. 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 Upper endoscopy with placement of radiation fiducial markers on 03/08/2018, now with  nausea, vomiting and abdominal pain. EXAM: CT ABDOMEN AND PELVIS WITH CONTRAST TECHNIQUE: Multidetector CT imaging of the abdomen and pelvis was performed using the standard protocol following bolus administration of intravenous contrast. CONTRAST:  79mL OMNIPAQUE IOHEXOL 300 MG/ML  SOLN COMPARISON:  03/08/2018 CT abdomen/pelvis. FINDINGS: Lower chest: Small dependent bilateral pleural effusions, left greater than right, new. Moderate bibasilar atelectasis. Tip of superior approach central venous catheter is seen near the cavoatrial junction. Hepatobiliary: Diffuse hepatic steatosis. No liver masses. Cholecystectomy. No biliary ductal dilatation. Pancreas: Hypoenhancing 2.3 x 2.1 cm uncinate process pancreatic mass (series 2/image 31), unchanged. Fiducial markers are again noted along right margin of the pancreatic mass. Diffuse thickening of the pancreas with prominent peripancreatic edema compatible with acute pancreatitis. There is non enhancement of the pancreatic parenchyma throughout the pancreatic body and much of the tail, compatible with necrotizing pancreatitis. No appreciable pancreatic duct dilation. Spleen: Normal size. No mass. Adrenals/Urinary Tract: Normal adrenals. Heterogeneously avidly enhancing exophytic 3.1 cm  renal cortical mass in the lateral interpolar left kidney, unchanged. No hydronephrosis. Normal bladder. Right periurethral 2.1 x 1.2 cm diverticulum now containing excreted contrast (series 2/image 80), unchanged. Stomach/Bowel: Stomach is nondistended. There is fold thickening throughout fundus and body of the stomach, not definitely changed. Normal caliber small bowel with no small bowel wall thickening. Normal appendix. Moderate sigmoid diverticulosis, with no definite large bowel wall thickening. Vascular/Lymphatic: Normal caliber abdominal aorta. Patent hepatic and renal veins with no renal vein tumor thrombus. Stable at least moderate narrowing of portal splenic venous confluence by the mass. Reproductive: Grossly normal uterus.  No adnexal mass. Other: No pneumoperitoneum. Stable moderate periumbilical fat containing hernia. Small to moderate volume ascites is increased. Retroperitoneal fluid in left greater than right anterior paranephric spaces is mildly decreased. New mild anasarca. No focal measurable fluid collections. Musculoskeletal: No aggressive appearing focal osseous lesions. Moderate thoracolumbar spondylosis. IMPRESSION: 1. Severe acute necrotizing pancreatitis with edema extending into the anterior paranephric retroperitoneal spaces bilaterally. Absence of pancreatic parenchymal enhancement throughout the pancreatic body and much of the pancreatic tail. 2. Pancreatic neoplasm in the uncinate process is unchanged. 3. Worsening third-spacing of fluid with new small dependent bilateral pleural effusions, new mild anasarca and increased small to moderate volume ascites. 4. Fold thickening throughout stomach is nonspecific and probably reactive or due to third spacing of fluid. 5. Stable avidly enhancing exophytic 3.1 cm lateral interpolar left renal mass compatible with renal cell carcinoma. 6. No findings of metastatic disease in the abdomen or pelvis. 7. Diffuse hepatic steatosis. 8. Stable right  urethral diverticulum. Electronically Signed   By: Ilona Sorrel M.D.   On: 03/10/2018 11:56   Dg Chest Port 1 View  Result Date: 03/10/2018 CLINICAL DATA:  68 year old with pneumonia. EXAM: PORTABLE CHEST 1 VIEW COMPARISON:  PET-CT 10/30/2017 FINDINGS: New patchy densities at the left lung base and difficult to exclude small left pleural effusion. Few densities along the medial right lung base. Upper lungs are clear. Heart size is within normal limits. Right jugular Port-A-Cath is present. Catheter tip is near the SVC and right atrium junction. Negative for a pneumothorax. Surgical clips in the right upper abdomen. IMPRESSION: Bibasilar chest densities, left side greater than right. Findings concerning for infection, particularly on the left side. Can not exclude small left pleural effusion. Electronically Signed   By: Markus Daft M.D.   On: 03/10/2018 09:06    Follow up with PCP in 1 week.  Management plans discussed with the patient, family and they are  in agreement.  CODE STATUS: Full code    Code Status Orders  (From admission, onward)         Start     Ordered   03/08/18 2140  Full code  Continuous     03/08/18 2139        Code Status History    Date Active Date Inactive Code Status Order ID Comments User Context   11/16/2017 1129 11/18/2017 1756 Full Code [id number]  Saundra Shelling, MD ED   10/18/2017 1500 10/19/2017 1830 Full Code [id number]  Henreitta Leber, MD ED    Advance Directive Documentation     Most Recent Value  Type of Advance Directive  Healthcare Power of [place], Living will  Pre-existing out of facility DNR order (yellow form or pink MOST form)  -  "MOST" Form in Place?  -      TOTAL TIME TAKING CARE OF THIS PATIENT ON DAY OF DISCHARGE: more than 35 minutes.   Berna Spare Raenah Murley M.D on 03/10/2018 at 9:20 PM  Between 7am to 6pm - Pager - [phone]  After 6pm go to [email or web address] - password [email or web address] [facility]  Office   [phone]  CC: Primary care physician; Glendon Axe, MD  Note: This dictation was prepared with Dragon dictation along with smaller phrase technology. Any transcriptional errors that result from this process are unintentional.

## 2018-03-10 NOTE — Progress Notes (Addendum)
69 year old female patient with history of pancreatic cancer, hypertension transferredfrom floor with  -Acute severe necrotizing pancreatitis with rising WBC count and mild AKI. Seen and examined. She is stable hemodynamically with epigastric pain and tenderness and shows no sign of any distress at present. CT done today shows worsening pancreatitis As per rad it shows 1. Severe acute necrotizing pancreatitis with edema extending into the anterior paranephric retroperitoneal spaces bilaterally. Absence of pancreatic parenchymal enhancement throughout the pancreatic body and much of the pancreatic tail. 2. Pancreatic neoplasm in the uncinate process is unchanged. 3. Worsening third-spacing of fluid with new small dependent bilateral pleural effusions, new mild anasarca and increased small to moderate volume ascites. 4. Fold thickening throughout stomach is nonspecific and probably reactive or due to third spacing of fluid. 5. Stable avidly enhancing exophytic 3.1 cm lateral interpolar left renal mass compatible with renal cell carcinoma. 6. No findings of metastatic disease in the abdomen or pelvis. 7. Diffuse hepatic steatosis. 8. Stable right urethral diverticulum.   .As per GI she is to be continued on clear liquids. She is on vanco/cefepime. GS doesn't feel that they can operate on her if needed.Although at present she doesnt need surgery.  Hospitalist have contacted with [facility]  for possible transfer. D/w staff and Dr Neta Mends  I received a call from  Dr Wohl,Darren(gastroenterologist) who feels that she needs  Pancreatic debridement(necrsoectomy) ASAP and should be transferred.  She has been accepted as a transfer to [facility] however bed is awaited.

## 2018-03-10 NOTE — Progress Notes (Signed)
Initial Nutrition Assessment  DOCUMENTATION CODES:   Severe malnutrition in context of chronic illness  INTERVENTION:  Clear liquid diet is being started today. Will monitor tolerance.  Initiation of early enteral nutrition (PO or TF) is important in severe acute pancreatitis as it maintains gut integrity, down regulates immune response, attenuates oxidative stress, lessens disease severity, promotes faster resolution of disease process, and reduces complications. If patient is unable to take PO consider placement of NGT for initiation of early enteral nutrition. If patient does not tolerate NG feedings, would then recommend advancement of tube to terminate in jejunum for NJ feedings.  Monitor magnesium, potassium, and phosphorus daily for at least 3 days, MD to replete as needed, as pt is at risk for refeeding syndrome given severe malnutrition.  NUTRITION DIAGNOSIS:   Severe Malnutrition related to chronic illness(pancreatic cancer) as evidenced by moderate fat depletion, moderate-severe muscle depletion, 17% weight loss in 4 months.  GOAL:   Patient will meet greater than or equal to 90% of their needs, Provide needs based on ASPEN/SCCM guidelines  MONITOR:   PO intake, Diet advancement, Labs, Weight trends, I & O's  REASON FOR ASSESSMENT:   Malnutrition Screening Tool    ASSESSMENT:   68 year old female with PMHx of HTN, stage IB pancreatic cancer (last chemo gemcitabine 02/19/2018; no further chemo until pt completes SBRT to pancreatic lesion) who is now admitted with severe acute necrotizing pancreatitis following placement of fiducial markers.   -Patient being transferred to ICU. Per chart may need to be transferred to tertiary care center.  Met with patient and her son at bedside. Patient reports her appetite has been decreased since August. She has been experiencing N/V, taste changes, and anorexia (absence of hunger). Now acutely having severe abdominal pain with acute  pancreatitis. She has been experiencing significant diarrhea after eating that has also been led to a decrease in intake due to fear of diarrhea. Patient may benefit from PERT. Currently constipated and has not had a BM since 1/16. She has only been eating 2 meals per day and she reports they are small meals. She may have plain noodles or rice. Occasionally has some bland chicken. She was drinking Ensure original or high protein, but reports she got tired of the taste. She does not like any of the clear ONS. The only thing she can really tolerate is the pre-mixed [employer]. She reports not tolerating milk products, but reports she can tolerate these and they do in fact contain milk/lactose. Patient is amenable to drinking CIB mixed in Lactaid milk once diet is able to be advanced here. Also encouraged patient to choose protein at each meal and snack. Currently her meals contain very little protein.  UBW 188 lbs (85.5 kg). Did not see a weight that high in the chart, but weights only go back to August 2019, so she likely had started losing weight prior to that. She was 173.4 lbs (78.8 kg) on 10/24/2017. Patient is currently 65.3 kg (144 lbs). She has lost 13.5 kg (17% body weight) over the past 4 months, which is significant for time frame.  Medications reviewed and include: cefepime, LR @ 150 mL/hr, vancomycin.  Labs reviewed: Chloride 114, CO2 21, Creatinine 1.42, Lipase 602 (trending down).   NUTRITION - FOCUSED PHYSICAL EXAM:    Most Recent Value  Orbital Region  Moderate depletion  Upper Arm Region  Moderate depletion  Thoracic and Lumbar Region  Unable to assess  Buccal Region  Moderate depletion  Temple Region  Severe depletion  Clavicle Bone Region  Moderate depletion  Clavicle and Acromion Bone Region  Moderate depletion  Scapular Bone Region  Moderate depletion  Dorsal Hand  Moderate depletion  Patellar Region  Severe depletion  Anterior Thigh Region  Severe depletion   Posterior Calf Region  Severe depletion  Edema (RD Assessment)  Mild  Hair  Reviewed  Eyes  Reviewed  Mouth  Reviewed  Skin  Reviewed  Nails  Reviewed     Diet Order:   Diet Order            Diet clear liquid Room service appropriate? Yes; Fluid consistency: Thin  Diet effective now             EDUCATION NEEDS:   Education needs have been addressed  Skin:  Skin Assessment: Reviewed RN Assessment  Last BM:  03/07/2018 per chart and pt report to RD  Height:   Ht Readings from Last 1 Encounters:  03/10/18 _0  (1.6 m)   Weight:   Wt Readings from Last 1 Encounters:  03/10/18 67.3 kg   Ideal Body Weight:  52.3 kg  BMI:  Body mass index is 26.28 kg/m.  Estimated Nutritional Needs:   Kcal:  1630-1960 (25-30 kcal/kg)  Protein:  95 grams (1.5 grams/kg)  Fluid:  1.6-1.9 L/day  Willey Blade, MS, RD, LDN Office: [phone] Pager: [phone] After Hours/Weekend Pager: [phone]

## 2018-03-10 NOTE — Consult Note (Signed)
Lucilla Lame, MD [facility]  [address] [place], [place] [address] Phone: [phone] Fax : [phone]  Consultation  Referring Provider:     Dr.Pyreddy Primary Care Physician:  Glendon Axe, MD Primary Gastroenterologist:  Althia Forts         Reason for Consultation:     Abdominal pain rule out ischemia  Date of Admission:  03/08/2018 Date of Consultation:  03/10/2018         HPI:   Cindy Bowman is a 68 y.o. female who came in with abdominal pain and reports that her pain started a few hours after having her EUS due to pancreatic cancer. The patient states that she had nausea when the pain started.  She was also found to have an elevated lipase at 600.  I received a message from the hospitalist requesting a consult for severe pancreatitis despite the patient not having a CT scan since the 17th of this month that showed a pancreatic head mass with diffuse bowel wall thickening of the stomach. The order was put in for abdominal pain rule out ischemia on the consultation.  In communication with the hospitalist I had recommended the patient undergo a repeat CT scan if her diagnosis was severe pancreatitis and possibly the cause of her  Other diagnosis of sepsis with a white cell count of 30. The patient's white cell count 2 days ago was 19.9 with yesterday being 23.1 and today being 30.5.  The patient denies any abdominal pain at the present time.  She was seen after having completed the CT scan.  She denies ever having pain like this previously.  The patient has not followed up with any Gastroenterologist in this area and states that she is seen by a Copywriter, advertising at an outside institution.  Past Medical History:  Diagnosis Date  . Essential hypertension   . Pancreatic cancer ([place])   . UTI (urinary tract infection)     Past Surgical History:  Procedure Laterality Date  . CESAREAN SECTION    . CHOLECYSTECTOMY    . EUS N/A 10/25/2017   Procedure: FULL UPPER ENDOSCOPIC  ULTRASOUND (EUS) RADIAL;  Surgeon: Jola Schmidt, MD;  Location: [facility] ENDOSCOPY;  Service: Endoscopy;  Laterality: N/A;  . OOPHORECTOMY    . PORTA CATH INSERTION N/A 11/12/2017   Procedure: PORTA CATH INSERTION;  Surgeon: Algernon Huxley, MD;  Location: [facility];  Service: Cardiovascular;  Laterality: N/A;  . TUBAL LIGATION      Prior to Admission medications   Medication Sig Start Date End Date Taking? Authorizing Provider  apixaban (ELIQUIS) 5 MG TABS tablet Take 1 tablet (5 mg total) by mouth 2 (two) times daily. 02/26/18  Yes Lloyd Huger, MD  loperamide (IMODIUM) 2 MG capsule Take 2 mg by mouth as needed for diarrhea or loose stools.    Yes [provider]  losartan (COZAAR) 50 MG tablet Take 1 tablet (50 mg total) by mouth daily. 11/19/17  Yes Vaughan Basta, MD  ondansetron (ZOFRAN ODT) 4 MG disintegrating tablet Take 1 tablet (4 mg total) by mouth every 8 (eight) hours as needed for nausea or vomiting. 10/17/17  Yes Earleen Newport, MD  Oxycodone HCl 10 MG TABS Take 1 tablet (10 mg total) by mouth every 6 (six) hours as needed. 02/06/18  Yes Lloyd Huger, MD  prochlorperazine (COMPAZINE) 10 MG tablet Take 1 tablet (10 mg total) by mouth every 6 (six) hours as needed (NAUSEA). 11/06/17 12/20/17  Delight Hoh  J, MD    Family History  Problem Relation Age of Onset  . Hypertension Mother        deceased 24  . Heart attack Father        deceased late 44s     Social History   Tobacco Use  . Smoking status: Never Smoker  . Smokeless tobacco: Never Used  Substance Use Topics  . Alcohol use: Not Currently  . Drug use: Never    Allergies as of 03/08/2018 - Review Complete 03/08/2018  Allergen Reaction Noted  . Nsaids Other (See Comments) 09/07/2016  . Ibuprofen  10/17/2017  . Penicillins  10/17/2017    Review of Systems:    All systems reviewed and negative except where noted in HPI.   Physical Exam:  Vital signs in last 24  hours: Temp:  [98.6 F (37 C)-99.4 F (37.4 C)] 99.2 F (37.3 C) (01/19 0857) Pulse Rate:  [114-130] 114 (01/19 0857) Resp:  [18-20] 18 (01/19 0442) BP: (142-155)/(60-66) 147/66 (01/19 0857) SpO2:  [93 %-100 %] 96 % (01/19 0857) Last BM Date: 03/07/18 General:   Pleasant, cooperative in NAD Head:  Normocephalic and atraumatic. Eyes:   No icterus.   Conjunctiva pink. PERRLA. Ears:  Normal auditory acuity. Neck:  Supple; no masses or thyroidomegaly Lungs: Respirations even and unlabored. Lungs clear to auscultation bilaterally.   No wheezes, crackles, or rhonchi.  Heart:  Regular rate and rhythm;  Without murmur, clicks, rubs or gallops Abdomen:  Soft, nondistended, nontender. Normal bowel sounds. No appreciable masses or hepatomegaly.  No rebound or guarding.  Rectal:  Not performed. Msk:  Symmetrical without gross deformities.   Extremities:  Without edema, cyanosis or clubbing. Neurologic:  Alert and oriented x3;  grossly normal neurologically. Skin:  Intact without significant lesions or rashes. Cervical Nodes:  No significant cervical adenopathy. Psych:  Alert and cooperative. Normal affect.  LAB RESULTS: Recent Labs    03/08/18 1538 03/09/18 0620 03/10/18 0415  WBC 19.9* 23.1* 30.5*  HGB 9.8* 9.9* 8.8*  HCT 29.7* 29.8* 26.8*  PLT 341 267 276   BMET Recent Labs    03/08/18 1538 03/09/18 0620 03/10/18 0415  NA 141 140 141  K 3.5 4.2 3.8  CL 111 112* 114*  CO2 20* 18* 21*  GLUCOSE 124* 166* 116*  BUN 12 13 21   CREATININE 0.92 1.17* 1.42*  CALCIUM 8.7* 7.9* 8.0*   LFT Recent Labs    03/08/18 1538  PROT 7.1  ALBUMIN 3.2*  AST 38  ALT 23  ALKPHOS 96  BILITOT 0.8   PT/INR No results for input(s): LABPROT, INR in the last 72 hours.  STUDIES: Ct Abdomen Pelvis W Contrast  Result Date: 03/08/2018 CLINICAL DATA:  Endoscopy today with increasing nausea and bilious vomiting. Patient is currently on radiation therapy for pancreatic cancer. EXAM: CT ABDOMEN  AND PELVIS WITH CONTRAST TECHNIQUE: Multidetector CT imaging of the abdomen and pelvis was performed using the standard protocol following bolus administration of intravenous contrast. CONTRAST:  11mL OMNIPAQUE IOHEXOL 300 MG/ML  SOLN COMPARISON:  February 15, 2018 FINDINGS: Lower chest: There is mild atelectasis of the lung bases. The heart size is normal. Hepatobiliary: Diffuse low density of the liver without vessel displacement is identified. No focal liver lesion is noted. Patient status post prior cholecystectomy. The intra and extrahepatic biliary ducts are normal. Pancreas: Surgical clips with associated hypodense mass is identified in the pancreatic head consistent with patient's known pancreatic cancer. Spleen: Normal in size without focal abnormality. Adrenals/Urinary  Tract: The bilateral adrenal glands are normal. The right kidney is normal. There is a heterogeneous enhancing partial cystic mass in the lateral midpole left kidney measuring 2.8 x 3 cm unchanged compared prior exam. Fluid-filled bladder is normal. Stomach/Bowel: There is hiatal hernia. There is mild diffuse bowel wall thickening of the stomach with surrounding fluid and inflammatory change. There is no small bowel obstruction. There is diverticulosis of colon without diverticulitis. The appendix is not definitely seen. Vascular/Lymphatic: No significant vascular findings are present. No enlarged abdominal or pelvic lymph nodes. Reproductive: Uterus and bilateral adnexa are unremarkable. Other: Moderate ascites is identified in the abdomen and pelvis. There is midline umbilical herniation of mesenteric fat. There is no free air. Musculoskeletal: Degenerative joint changes of the spine are noted. IMPRESSION: Diffuse bowel wall thickening of the stomach. This is nonspecific. Finding can be due to infectious/inflammatory process. No free air. Mass in the pancreatic head consistent with patient's known pancreatic cancer. Moderate ascites in the  abdomen and pelvis. Heterogeneous enhancing partial cystic mass in the lateral midpole left kidney unchanged suspicious for renal cell carcinoma. Electronically Signed   By: Abelardo Diesel M.D.   On: 03/08/2018 18:15   Dg Chest Port 1 View  Result Date: 03/10/2018 CLINICAL DATA:  68 year old with pneumonia. EXAM: PORTABLE CHEST 1 VIEW COMPARISON:  PET-CT 10/30/2017 FINDINGS: New patchy densities at the left lung base and difficult to exclude small left pleural effusion. Few densities along the medial right lung base. Upper lungs are clear. Heart size is within normal limits. Right jugular Port-A-Cath is present. Catheter tip is near the SVC and right atrium junction. Negative for a pneumothorax. Surgical clips in the right upper abdomen. IMPRESSION: Bibasilar chest densities, left side greater than right. Findings concerning for infection, particularly on the left side. Can not exclude small left pleural effusion. Electronically Signed   By: Markus Daft M.D.   On: 03/10/2018 09:06      Impression / Plan:   Assessment: Active Problems:   Acute pancreatitis   Cindy Bowman is a 68 y.o. y/o female with A known history of pancreatic cancer who is receiving chemotherapy and underwent an EUS on the day her pain started. The patient's lipase was over 2002 days ago, 672 yesterday and 602 today. The patient had a CT scan of the abdomen with contrast today and those results are pending.  Plan: If the patient's CT scan of the abdomen shows any sign of pancreatic necrosis or infection the patient should be transferred to a tertiary care center for possible debridement.  Other sources of infection and white cell count should be evaluated due to her immunocompromised state due to her cancer and undergoing treatment with chemotherapy.  The patient has been explained the plan and agrees with it.  Thank you for involving me in the care of this patient.      LOS: 2 days   Lucilla Lame, MD  03/10/2018, 10:59  AM    Note: This dictation was prepared with Dragon dictation along with smaller phrase technology. Any transcriptional errors that result from this process are unintentional.

## 2018-03-10 NOTE — Progress Notes (Signed)
Report called to Treasa School, RN at [facility] for transport to [place]. Right Chest PAC saline locked and patient transferred to care of [facility].

## 2018-03-10 NOTE — Consult Note (Signed)
Subjective:   CC: abdominal pain  HPI:  Cindy Bowman is a 68 y.o. female who is consulted by Pyreddy for evaluation of above cc.  Symptoms were first noted 2 days ago. Pain is sharp located in epigastric region.  Associated with nausea but no emesis. exacerbated by nothing specific.  Recently underwent EUS procedure at [facility] for placement of markers in preparation for radiation therapy for her pancreatic cancer.     Past Medical History:  has a past medical history of Essential hypertension, Pancreatic cancer ([place]), and UTI (urinary tract infection).  Past Surgical History:  has a past surgical history that includes Oophorectomy; Cholecystectomy; Tubal ligation; Cesarean section; EUS (N/A, 10/25/2017); and PORTA CATH INSERTION (N/A, 11/12/2017).  Family History: family history includes Heart attack in her father; Hypertension in her mother.  Social History:  reports that she has never smoked. She has never used smokeless tobacco. She reports previous alcohol use. She reports that she does not use drugs.  Current Medications:  Medications Prior to Admission  Medication Sig Dispense Refill  . apixaban (ELIQUIS) 5 MG TABS tablet Take 1 tablet (5 mg total) by mouth 2 (two) times daily. 60 tablet 3  . loperamide (IMODIUM) 2 MG capsule Take 2 mg by mouth as needed for diarrhea or loose stools.     Marland Kitchen losartan (COZAAR) 50 MG tablet Take 1 tablet (50 mg total) by mouth daily. 30 tablet 0  . ondansetron (ZOFRAN ODT) 4 MG disintegrating tablet Take 1 tablet (4 mg total) by mouth every 8 (eight) hours as needed for nausea or vomiting. 20 tablet 0  . Oxycodone HCl 10 MG TABS Take 1 tablet (10 mg total) by mouth every 6 (six) hours as needed. 60 tablet 0    Allergies:  Allergies as of 03/08/2018 - Review Complete 03/08/2018  Allergen Reaction Noted  . Nsaids Other (See Comments) 09/07/2016  . Ibuprofen  10/17/2017  . Penicillins  10/17/2017    ROS:  General: Denies weight loss, weight  gain, fatigue, fevers, chills, and night sweats. Eyes: Denies blurry vision, double vision, eye pain, itchy eyes, and tearing. Ears: Denies hearing loss, earache, and ringing in ears. Nose: Denies sinus pain, congestion, infections, runny nose, and nosebleeds. Mouth/throat: Denies hoarseness, sore throat, bleeding gums, and difficulty swallowing. Heart: Denies chest pain, palpitations, racing heart, irregular heartbeat, leg pain or swelling, and decreased activity tolerance. Respiratory: Denies breathing difficulty, shortness of breath, wheezing, cough, and sputum. GI: Denies change in appetite, heartburn. constipation, diarrhea, and blood in stool. GU: Denies difficulty urinating, pain with urinating, urgency, frequency, blood in urine. Musculoskeletal: Denies joint stiffness, pain, swelling, muscle weakness Skin: Denies rash, itching, mass, tumors, sores, and boils Neurologic: Denies headache, fainting, dizziness, seizures, numbness, and tingling. Psychiatric: Denies depression, anxiety, difficulty sleeping, and memory loss. Endocrine: Denies heat or cold intolerance, and increased thirst or urination. Blood/lymph: Denies easy bruising, easy bruising, and swollen glands   Objective:     BP (!) 150/62   Pulse (!) 110   Temp 98.3 F (36.8 C) (Oral)   Resp (!) 24   Ht 5\' 3"  (1.6 m)   Wt 67.3 kg   SpO2 100%   BMI 26.28 kg/m    Constitutional :  alert, cooperative, appears stated age and no distress  Lymphatics/Throat:  no asymmetry, masses, or scars  Respiratory:  clear to auscultation bilaterally  Cardiovascular:  regular rate and rhythm  Gastrointestinal: Soft, no guarding, focal tenderness still present in the epigastric region.  Slight distention  Musculoskeletal: Steady gait and movement  Skin: Cool and moist,  Psychiatric: Normal affect, non-agitated, not confused       LABS:  CMP Latest Ref Rng & Units 03/10/2018 03/09/2018 03/08/2018  Glucose 70 - 99 mg/dL 116(H)  166(H) 124(H)  BUN 8 - 23 mg/dL 21 13 12   Creatinine 0.44 - 1.00 mg/dL 1.42(H) 1.17(H) 0.92  Sodium 135 - 145 mmol/L 141 140 141  Potassium 3.5 - 5.1 mmol/L 3.8 4.2 3.5  Chloride 98 - 111 mmol/L 114(H) 112(H) 111  CO2 22 - 32 mmol/L 21(L) 18(L) 20(L)  Calcium 8.9 - 10.3 mg/dL 8.0(L) 7.9(L) 8.7(L)  Total Protein 6.5 - 8.1 g/dL - - 7.1  Total Bilirubin 0.3 - 1.2 mg/dL - - 0.8  Alkaline Phos 38 - 126 U/L - - 96  AST 15 - 41 U/L - - 38  ALT 0 - 44 U/L - - 23   CBC Latest Ref Rng & Units 03/10/2018 03/10/2018 03/09/2018  WBC 4.0 - 10.5 K/uL 28.8(H) 30.5(H) 23.1(H)  Hemoglobin 12.0 - 15.0 g/dL 8.4(L) 8.8(L) 9.9(L)  Hematocrit 36.0 - 46.0 % 25.7(L) 26.8(L) 29.8(L)  Platelets 150 - 400 K/uL 220 276 267     RADS: CLINICAL DATA:  Endoscopy today with increasing nausea and bilious vomiting. Patient is currently on radiation therapy for pancreatic cancer.  EXAM: CT ABDOMEN AND PELVIS WITH CONTRAST  TECHNIQUE: Multidetector CT imaging of the abdomen and pelvis was performed using the standard protocol following bolus administration of intravenous contrast.  CONTRAST:  45mL OMNIPAQUE IOHEXOL 300 MG/ML  SOLN  COMPARISON:  February 15, 2018  FINDINGS: Lower chest: There is mild atelectasis of the lung bases. The heart size is normal.  Hepatobiliary: Diffuse low density of the liver without vessel displacement is identified. No focal liver lesion is noted. Patient status post prior cholecystectomy. The intra and extrahepatic biliary ducts are normal.  Pancreas: Surgical clips with associated hypodense mass is identified in the pancreatic head consistent with patient's known pancreatic cancer.  Spleen: Normal in size without focal abnormality.  Adrenals/Urinary Tract: The bilateral adrenal glands are normal. The right kidney is normal. There is a heterogeneous enhancing partial cystic mass in the lateral midpole left kidney measuring 2.8 x 3 cm unchanged compared prior  exam. Fluid-filled bladder is normal.  Stomach/Bowel: There is hiatal hernia. There is mild diffuse bowel wall thickening of the stomach with surrounding fluid and inflammatory change. There is no small bowel obstruction. There is diverticulosis of colon without diverticulitis. The appendix is not definitely seen.  Vascular/Lymphatic: No significant vascular findings are present. No enlarged abdominal or pelvic lymph nodes.  Reproductive: Uterus and bilateral adnexa are unremarkable.  Other: Moderate ascites is identified in the abdomen and pelvis. There is midline umbilical herniation of mesenteric fat. There is no free air.  Musculoskeletal: Degenerative joint changes of the spine are noted.  IMPRESSION: Diffuse bowel wall thickening of the stomach. This is nonspecific. Finding can be due to infectious/inflammatory process.  No free air.  Mass in the pancreatic head consistent with patient's known pancreatic cancer. Moderate ascites in the abdomen and pelvis.  Heterogeneous enhancing partial cystic mass in the lateral midpole left kidney unchanged suspicious for renal cell carcinoma.   Electronically Signed   By: Abelardo Diesel M.D.   On: 03/08/2018 18:15  CLINICAL DATA:  68 year old with pneumonia.  EXAM: PORTABLE CHEST 1 VIEW  COMPARISON:  PET-CT 10/30/2017  FINDINGS: New patchy densities at the left lung base and difficult to exclude small left  pleural effusion. Few densities along the medial right lung base. Upper lungs are clear. Heart size is within normal limits. Right jugular Port-A-Cath is present. Catheter tip is near the SVC and right atrium junction. Negative for a pneumothorax. Surgical clips in the right upper abdomen.  IMPRESSION: Bibasilar chest densities, left side greater than right. Findings concerning for infection, particularly on the left side. Can not exclude small left pleural effusion.   Electronically Signed   By:  Markus Daft M.D.   On: 03/10/2018 09:06 Assessment:      Abdominal pain secondary to pancreatitis.  History of recent EUS for marker placement for pancreatic cancer.  Now with worsening labs.  Plan:     Despite the worsening lab values this morning patient does state the pain overall has gotten better since admission.  Physical exam still has persistent tenderness in the epigastric region, but it cannot be considered an acute abdomen.  Absence of free air noted in this morning's chest x-ray is also reassuring.  Will recommend continued aggressive treatment of her known pancreatitis and further monitoring.  No need for surgical intervention at this time.  If patient continues to deteriorate need to consider transfer to a tertiary care center, since pancreatic surgery is not offered here at [facility]  Was discussed with primary provider Pyeddy as soon as the exam was performed and he verbalized understanding and agreement with plan

## 2018-03-11 DIAGNOSIS — I82412 Acute embolism and thrombosis of left femoral vein: Secondary | ICD-10-CM | POA: Insufficient documentation

## 2018-03-11 DIAGNOSIS — N179 Acute kidney failure, unspecified: Secondary | ICD-10-CM | POA: Insufficient documentation

## 2018-03-11 LAB — URINE CULTURE: Culture: >=100000 — AB

## 2018-03-12 ENCOUNTER — Telehealth: Payer: Self-pay

## 2018-03-12 ENCOUNTER — Ambulatory Visit: Payer: Medicare Other

## 2018-03-12 ENCOUNTER — Ambulatory Visit: Payer: Medicare Other | Admitting: Oncology

## 2018-03-12 ENCOUNTER — Other Ambulatory Visit: Payer: Medicare Other

## 2018-03-12 ENCOUNTER — Ambulatory Visit: Payer: Medicare Other | Admitting: Hospice and Palliative Medicine

## 2018-03-12 ENCOUNTER — Other Ambulatory Visit: Payer: Self-pay | Admitting: General Surgery

## 2018-03-12 NOTE — Telephone Encounter (Signed)
Spoke to Cindy Bowman this morning, Cindy Bowman continues to have right much back and abdominal pain. She is "swelling with fluid". She is having another scan today. He asked if I could contact radiation to let them know she would not be at her simulation today. I have called [facility] [place] and they do not have her scheduled. I let Cindy Bowman know that they must of already cancelled her appointment. We will continue to follow her hospitalization.

## 2018-03-13 MED ORDER — SENNOSIDES-DOCUSATE SODIUM 8.6-50 MG PO TABS
2.00 | ORAL_TABLET | ORAL | Status: DC
Start: 2018-03-13 — End: 2018-03-13

## 2018-03-13 MED ORDER — ENOXAPARIN SODIUM 80 MG/0.8ML ~~LOC~~ SOLN
1.00 | SUBCUTANEOUS | Status: DC
Start: 2018-03-13 — End: 2018-03-13

## 2018-03-13 MED ORDER — LACTATED RINGERS IV SOLN
INTRAVENOUS | Status: DC
Start: ? — End: 2018-03-13

## 2018-03-13 MED ORDER — FOLIC ACID 1 MG PO TABS
1.00 | ORAL_TABLET | ORAL | Status: DC
Start: 2018-03-14 — End: 2018-03-13

## 2018-03-13 MED ORDER — LIDOCAINE HCL 1 % IJ SOLN
.50 | INTRAMUSCULAR | Status: DC
Start: ? — End: 2018-03-13

## 2018-03-13 MED ORDER — SODIUM CHLORIDE 0.9 % IV SOLN
INTRAVENOUS | Status: DC
Start: ? — End: 2018-03-13

## 2018-03-13 MED ORDER — GENERIC EXTERNAL MEDICATION
Status: DC
Start: ? — End: 2018-03-13

## 2018-03-13 MED ORDER — GENERIC EXTERNAL MEDICATION
4.00 | Status: DC
Start: ? — End: 2018-03-13

## 2018-03-13 MED ORDER — POLYETHYLENE GLYCOL 3350 17 G PO PACK
17.00 | PACK | ORAL | Status: DC
Start: 2018-03-13 — End: 2018-03-13

## 2018-03-13 MED ORDER — NALOXONE HCL 0.4 MG/ML IJ SOLN
0.40 | INTRAMUSCULAR | Status: DC
Start: ? — End: 2018-03-13

## 2018-03-13 MED ORDER — SODIUM BICARBONATE 650 MG PO TABS
650.00 | ORAL_TABLET | ORAL | Status: DC
Start: 2018-03-13 — End: 2018-03-13

## 2018-03-13 MED ORDER — LIDOCAINE-PRILOCAINE 2.5-2.5 % EX CREA
TOPICAL_CREAM | CUTANEOUS | Status: DC
Start: ? — End: 2018-03-13

## 2018-03-13 MED ORDER — HYDROMORPHONE HCL-NACL 25-0.9 MG/25ML-% IV SOSY
.60 | PREFILLED_SYRINGE | INTRAVENOUS | Status: DC
Start: ? — End: 2018-03-13

## 2018-03-15 LAB — CULTURE, BLOOD (ROUTINE X 2)
Culture: NO GROWTH
Culture: NO GROWTH
Special Requests: ADEQUATE

## 2018-03-19 ENCOUNTER — Other Ambulatory Visit: Payer: Self-pay

## 2018-03-19 ENCOUNTER — Telehealth: Payer: Self-pay | Admitting: *Deleted

## 2018-03-19 MED ORDER — APIXABAN 5 MG PO TABS: 5.0000 mg | ORAL_TABLET | Freq: Two times a day (BID) | ORAL | 3 refills | Status: DC

## 2018-03-19 NOTE — Telephone Encounter (Signed)
Received call from son, [name]. Cindy Bowman need a refill on her Eliquis. She has one dose left. She also wanted to make Dr. Grayland Ormond aware that [facility] increased her oxycodone to 15mg . She has been rescheduled for radiation simulation on 03/26/18.

## 2018-03-19 NOTE — Telephone Encounter (Signed)
Entered in error

## 2018-03-21 DIAGNOSIS — K439 Ventral hernia without obstruction or gangrene: Secondary | ICD-10-CM | POA: Insufficient documentation

## 2018-03-22 ENCOUNTER — Other Ambulatory Visit: Payer: Self-pay | Admitting: Oncology

## 2018-03-26 ENCOUNTER — Ambulatory Visit
Admission: RE | Admit: 2018-03-26 | Discharge: 2018-03-26 | Disposition: A | Discharge status: Home / Self Care | Payer: Medicare Other | Source: Ambulatory Visit | Attending: Radiation Oncology | Admitting: Radiation Oncology

## 2018-03-26 VITALS — BP 135/57 | HR 86 | Temp 98.4°F | Resp 20 | Ht 63.0 in | Wt 132.2 lb

## 2018-03-26 DIAGNOSIS — C259 Malignant neoplasm of pancreas, unspecified: Secondary | ICD-10-CM | POA: Insufficient documentation

## 2018-03-26 DIAGNOSIS — Z510 Encounter for antineoplastic radiation therapy: Secondary | ICD-10-CM | POA: Insufficient documentation

## 2018-03-26 DIAGNOSIS — C250 Malignant neoplasm of head of pancreas: Secondary | ICD-10-CM

## 2018-03-26 MED ORDER — HEPARIN SOD (PORK) LOCK FLUSH 100 UNIT/ML IV SOLN: 500.0000 [IU] | Freq: Once | INTRAVENOUS | Status: DC

## 2018-03-26 MED ORDER — SODIUM CHLORIDE 0.9% FLUSH: 10.0000 mL | Freq: Once | INTRAVENOUS | Status: DC

## 2018-03-26 NOTE — Progress Notes (Signed)
Has armband been applied?  Yes  Does patient have an allergy to IV contrast dye?: No   Has patient ever received premedication for IV contrast dye?: No  Does patient take metformin?: No  If patient does take metformin when was the last dose: N/A  Date of lab work: 03/10/2018 BUN: 21 CR: 1.42 EGfr: 44  IV site: Right Chest port  Has IV site been added to flowsheet?  Yes

## 2018-03-27 ENCOUNTER — Telehealth: Payer: Self-pay

## 2018-03-27 NOTE — Telephone Encounter (Signed)
I have scheduled the patient for March 3rd, 2020. Our transportation Lucianne Lei will pick pt up @ 9:00 am.

## 2018-03-27 NOTE — Telephone Encounter (Signed)
Ms. Cindy Bowman will finish SBRT 04/19/18. She will be arranged to follow up with Dr. Grayland Ormond following.

## 2018-03-28 ENCOUNTER — Encounter: Payer: Self-pay | Admitting: Oncology

## 2018-03-29 ENCOUNTER — Other Ambulatory Visit: Payer: Self-pay

## 2018-03-29 ENCOUNTER — Telehealth: Payer: Self-pay

## 2018-03-29 NOTE — Telephone Encounter (Signed)
Called and notified Cindy Bowman with her appointment details for follow up with Dr. Grayland Ormond. 04/22/18. She has appointment with Dr. Barry Dienes to follow up for surgery on that day. We will reschedule ours to after so we can also have her opinion regarding surgery. We will call her with new appointment.

## 2018-03-29 NOTE — Telephone Encounter (Signed)
Appt has been moved to 04/23/18. Lucianne Lei will pick pt up @ 9:00 am. Will notify the patient.

## 2018-04-01 ENCOUNTER — Telehealth: Payer: Self-pay | Admitting: *Deleted

## 2018-04-01 MED ORDER — OXYCODONE HCL 15 MG PO TABS: ORAL_TABLET | ORAL | 0 refills | Status: DC

## 2018-04-01 MED ORDER — FENTANYL 50 MCG/HR TD PT72: 1.0000 | MEDICATED_PATCH | TRANSDERMAL | 0 refills | Status: AC

## 2018-04-01 NOTE — Telephone Encounter (Signed)
Patient called reporting uncontrolled pain and is asking for an increase in her Oxycodone. She reports that she took 2 of her 15 mg tabs as well as a 10 mg tablet she had total 40 mg and that seems to help it. And then again asked for an increase in Oxycodone. Please advise

## 2018-04-01 NOTE — Telephone Encounter (Signed)
Patient saw Dr Aleene Davidson 1/30 who increased her Fentanyl to 37.5 mcg and gave her Oxycodone 15 mg # 60 tabs. Discussed with Dr Grayland Ormond and he has ordered increasing Fentanyl to 50 mcg and that she can take Oxycodone 15 mg every 6 hours as needed and to only let the [facility] adjust her medications to avoid confusion. I spoke with patient and she states she has Oxycodone and Fentayl 25 a whole box on hand and she was instructed to take off the 12 mcg patchand apply a second 25 mcg patch for a dose of 50 mcg and that she can 1 or 2 Oxycodone 15 mg tabs every 6 hours as needed She repeated this all back to me and agrees to only let [facility] adjust her medications

## 2018-04-01 NOTE — Telephone Encounter (Signed)
Were the two 15mg  tabs MS contin?

## 2018-04-02 ENCOUNTER — Inpatient Hospital Stay: Payer: Medicare Other

## 2018-04-02 ENCOUNTER — Other Ambulatory Visit: Payer: Self-pay

## 2018-04-02 ENCOUNTER — Inpatient Hospital Stay: Payer: Medicare Other | Attending: Oncology | Admitting: Oncology

## 2018-04-02 VITALS — BP 135/76 | HR 93 | Temp 97.6°F | Resp 18

## 2018-04-02 DIAGNOSIS — C259 Malignant neoplasm of pancreas, unspecified: Secondary | ICD-10-CM

## 2018-04-02 DIAGNOSIS — K5900 Constipation, unspecified: Secondary | ICD-10-CM

## 2018-04-02 DIAGNOSIS — C250 Malignant neoplasm of head of pancreas: Secondary | ICD-10-CM

## 2018-04-02 DIAGNOSIS — R530 Neoplastic (malignant) related fatigue: Secondary | ICD-10-CM

## 2018-04-02 DIAGNOSIS — E860 Dehydration: Secondary | ICD-10-CM | POA: Diagnosis not present

## 2018-04-02 DIAGNOSIS — R109 Unspecified abdominal pain: Secondary | ICD-10-CM | POA: Diagnosis not present

## 2018-04-02 DIAGNOSIS — R1033 Periumbilical pain: Secondary | ICD-10-CM | POA: Insufficient documentation

## 2018-04-02 DIAGNOSIS — E876 Hypokalemia: Secondary | ICD-10-CM

## 2018-04-02 DIAGNOSIS — R112 Nausea with vomiting, unspecified: Secondary | ICD-10-CM

## 2018-04-02 DIAGNOSIS — R110 Nausea: Secondary | ICD-10-CM | POA: Diagnosis not present

## 2018-04-02 DIAGNOSIS — G893 Neoplasm related pain (acute) (chronic): Secondary | ICD-10-CM

## 2018-04-02 DIAGNOSIS — Z95828 Presence of other vascular implants and grafts: Secondary | ICD-10-CM

## 2018-04-02 LAB — COMPREHENSIVE METABOLIC PANEL
ALT: 15 U/L (ref 0–44)
AST: 33 U/L (ref 15–41)
Albumin: 2.5 g/dL — ABNORMAL LOW (ref 3.5–5.0)
Alkaline Phosphatase: 98 U/L (ref 38–126)
Anion gap: 10 (ref 5–15)
BUN: 10 mg/dL (ref 8–23)
CHLORIDE: 107 mmol/L (ref 98–111)
CO2: 23 mmol/L (ref 22–32)
Calcium: 8.5 mg/dL — ABNORMAL LOW (ref 8.9–10.3)
Creatinine, Ser: 1.1 mg/dL — ABNORMAL HIGH (ref 0.44–1.00)
GFR calc Af Amer: 59 mL/min — ABNORMAL LOW (ref >60)
GFR calc non Af Amer: 51 mL/min — ABNORMAL LOW (ref >60)
Glucose, Bld: 115 mg/dL — ABNORMAL HIGH (ref 70–99)
Potassium: 3 mmol/L — ABNORMAL LOW (ref 3.5–5.1)
Sodium: 140 mmol/L (ref 135–145)
Total Bilirubin: 1 mg/dL (ref 0.3–1.2)
Total Protein: 7.1 g/dL (ref 6.5–8.1)

## 2018-04-02 LAB — CBC WITH DIFFERENTIAL/PLATELET
Abs Immature Granulocytes: 0.05 10*3/uL (ref 0.00–0.07)
Basophils Absolute: 0 10*3/uL (ref 0.0–0.1)
Basophils Relative: 0 %
Eosinophils Absolute: 0.1 10*3/uL (ref 0.0–0.5)
Eosinophils Relative: 1 %
HCT: 27.5 % — ABNORMAL LOW (ref 36.0–46.0)
Hemoglobin: 8.9 g/dL — ABNORMAL LOW (ref 12.0–15.0)
Immature Granulocytes: 1 %
Lymphocytes Relative: 25 %
Lymphs Abs: 2.2 10*3/uL (ref 0.7–4.0)
MCH: 32.8 pg (ref 26.0–34.0)
MCHC: 32.4 g/dL (ref 30.0–36.0)
MCV: 101.5 fL — ABNORMAL HIGH (ref 80.0–100.0)
Monocytes Absolute: 0.9 10*3/uL (ref 0.1–1.0)
Monocytes Relative: 10 %
NRBC: 0 % (ref 0.0–0.2)
Neutro Abs: 5.7 10*3/uL (ref 1.7–7.7)
Neutrophils Relative %: 63 %
Platelets: 245 10*3/uL (ref 150–400)
RBC: 2.71 MIL/uL — AB (ref 3.87–5.11)
RDW: 17 % — ABNORMAL HIGH (ref 11.5–15.5)
WBC: 8.9 10*3/uL (ref 4.0–10.5)

## 2018-04-02 LAB — LIPASE, BLOOD: Lipase: 60 U/L — ABNORMAL HIGH (ref 11–51)

## 2018-04-02 LAB — MAGNESIUM: Magnesium: 1.7 mg/dL (ref 1.7–2.4)

## 2018-04-02 LAB — AMYLASE: Amylase: 131 U/L — ABNORMAL HIGH (ref 28–100)

## 2018-04-02 MED ORDER — MORPHINE SULFATE (PF) 2 MG/ML IV SOLN
2.0000 mg | Freq: Once | INTRAVENOUS | Status: AC
  Administered 2018-04-02: 2 mg via INTRAVENOUS
  Filled 2018-04-02: qty 1

## 2018-04-02 MED ORDER — HEPARIN SOD (PORK) LOCK FLUSH 100 UNIT/ML IV SOLN
500.0000 [IU] | Freq: Once | INTRAVENOUS | Status: AC
  Administered 2018-04-02: 500 [IU] via INTRAVENOUS

## 2018-04-02 MED ORDER — MORPHINE SULFATE (PF) 2 MG/ML IV SOLN
2.0000 mg | Freq: Once | INTRAVENOUS | Status: AC
  Administered 2018-04-02: 2 mg via INTRAVENOUS

## 2018-04-02 MED ORDER — OXYCODONE HCL 15 MG PO TABS: ORAL_TABLET | ORAL | 0 refills | Status: DC

## 2018-04-02 MED ORDER — SODIUM CHLORIDE 0.9 % IV SOLN
Freq: Once | INTRAVENOUS | Status: AC
  Administered 2018-04-02: 10:00:00 via INTRAVENOUS
  Filled 2018-04-02: qty 250

## 2018-04-02 MED ORDER — MORPHINE SULFATE (PF) 2 MG/ML IV SOLN
INTRAVENOUS | Status: AC
  Filled 2018-04-02: qty 1

## 2018-04-02 MED ORDER — DEXAMETHASONE SODIUM PHOSPHATE 10 MG/ML IJ SOLN
10.0000 mg | Freq: Once | INTRAMUSCULAR | Status: AC
  Administered 2018-04-02: 10 mg via INTRAVENOUS
  Filled 2018-04-02: qty 1

## 2018-04-02 MED ORDER — ONDANSETRON HCL 4 MG/2ML IJ SOLN
8.0000 mg | Freq: Once | INTRAMUSCULAR | Status: AC
  Administered 2018-04-02: 8 mg via INTRAVENOUS
  Filled 2018-04-02: qty 4

## 2018-04-02 MED ORDER — SODIUM CHLORIDE 0.9 % IV SOLN
40.0000 meq | Freq: Once | INTRAVENOUS | Status: AC
  Administered 2018-04-02: 40 meq via INTRAVENOUS
  Filled 2018-04-02: qty 20

## 2018-04-02 MED ORDER — SODIUM CHLORIDE 0.9% FLUSH
10.0000 mL | INTRAVENOUS | Status: DC | PRN
  Administered 2018-04-02: 10 mL via INTRAVENOUS
  Filled 2018-04-02: qty 10

## 2018-04-02 NOTE — Patient Instructions (Signed)
We have changed your pain medication to Oxycodone 1-2 tablets every 3 hours instead of every 6 hours as needed for pain. Continue your fentanyl patch. We will touch base tomorrow and see if pain is better controlled.   New prescription sent to your pharmacy.   Faythe Casa, NP 04/02/2018 1:34 PM

## 2018-04-02 NOTE — Progress Notes (Signed)
Symptom Management Consult note [facility]  Telephone:([phone] [phone]  Patient Care Team: Glendon Axe, MD as PCP - General (Internal Medicine) Clent Jacks, RN as Registered Nurse Lloyd Huger, MD as Medical Oncologist (Medical Oncology)   Name of the patient: Cindy Bowman  [id number]  [birth year]   Date of visit: 04/02/2018  Diagnosis: Pancreatic cancer   Chief Complaint: Pain control   Current Treatment: s/p cycle 3-day 1 single agent gemcitabine. Last given on 02/19/18.  S/p fiduciary placement for SBRT. Received 3 cycles (Day 1, day 8, day 15).   Oncology History: Patient with recent diagnosis of pancreatic cancer in August 2019.  Last seen by primary medical oncologist on 03/09/2018 by Dr. Grayland Ormond.  She received 3 cycles of single agent gemcitabine on 02/19/18.  Had fiduciary placement for SBRT by [facility].  Unfortunately, developed acute pancreatitis and whas admitted to hospital for IV fluids and pain control (03/08/18- 03/10/18).   While inpatient, she was given IV fluids and remained n.p.o.  Had CT abdomen revealing pancreatic head mass with stomach wall thickening.  Had elevation of her white blood cell count during hospitalization.  Was consulted with surgery and GI.  Repeat CT abdomen showed severe necrotizing pancreatitis.  Covered with broad-spectrum IV vancomycin and cefepime.  Pancultures completed.  GI recommended she be transferred to tertiary hospital for debridement of pancreas.   She was admitted to [facility]) tertiary hospital for necrotizing pancreatitis on 03/10/2018 for possible operative debridement.  At discharge she received oxycodone 15 mg and single fentanyl patch.  Scheduled to be seen by PCP Dr. Candiss Norse on 03/21/2018.  She was tolerating p.o. fluids and solids at time of discharge.  She was scheduled to follow-up with GI for repeat CT scans in 2 to 3 weeks.  Pancreatic lesion unfortunately abutting the  SMA requiring neoadjuvant chemotherapy.  Surgery believes she is borderline resectable.  Attempted neoadjuvant FOLFIRINOX but developed severe side effects.    Pancreatic adenocarcinoma ([place])   10/18/2017 Initial Diagnosis    Pancreatic adenocarcinoma ([place])    10/26/2017 Cancer Staging    Staging form: Exocrine Pancreas, AJCC 8th Edition - Clinical stage from 10/26/2017: Stage IB (cT2, cN0, cM0) - Signed by Lloyd Huger, MD on 10/26/2017    11/06/2017 - 12/11/2017 Chemotherapy    The patient had palonosetron (ALOXI) injection 0.25 mg, 0.25 mg, Intravenous,  Once, 2 of 4 cycles Administration: 0.25 mg (11/14/2017), 0.25 mg (11/28/2017) irinotecan (CAMPTOSAR) 340 mg in dextrose 5 % 500 mL chemo infusion, 180 mg/m2 = 340 mg, Intravenous,  Once, 2 of 4 cycles Dose modification: 160 mg/m2 (original dose 180 mg/m2, Cycle 2, Reason: Dose not tolerated) Administration: 340 mg (11/14/2017), 300 mg (11/28/2017) leucovorin 750 mg in dextrose 5 % 250 mL infusion, 760 mg, Intravenous,  Once, 2 of 4 cycles Dose modification: 360 mg/m2 (original dose 400 mg/m2, Cycle 2, Reason: Dose not tolerated) Administration: 750 mg (11/14/2017), 700 mg (11/28/2017) oxaliplatin (ELOXATIN) 150 mg in dextrose 5 % 500 mL chemo infusion, 160 mg, Intravenous,  Once, 2 of 4 cycles Dose modification: 70 mg/m2 (original dose 85 mg/m2, Cycle 2, Reason: Dose not tolerated) Administration: 150 mg (11/14/2017), 135 mg (11/28/2017) fluorouracil (ADRUCIL) chemo injection 750 mg, 400 mg/m2 = 750 mg, Intravenous,  Once, 2 of 4 cycles Dose modification: 360 mg/m2 (original dose 400 mg/m2, Cycle 2, Reason: Dose not tolerated) Administration: 750 mg (11/14/2017), 700 mg (11/28/2017) fluorouracil (ADRUCIL) 4,550 mg in sodium chloride 0.9 % 59  mL chemo infusion, 2,400 mg/m2 = 4,550 mg, Intravenous, 1 Day/Dose, 2 of 4 cycles Dose modification: 2,000 mg/m2 (original dose 2,400 mg/m2, Cycle 2, Reason: Dose not tolerated) Administration: 4,550 mg  (11/14/2017), 3,800 mg (11/28/2017)  for chemotherapy treatment.     12/26/2017 -  Chemotherapy    The patient had gemcitabine (GEMZAR) 1,800 mg in sodium chloride 0.9 % 250 mL chemo infusion, 1,786 mg, Intravenous,  Once, 3 of 4 cycles Administration: 1,800 mg (12/26/2017), 1,800 mg (01/02/2018), 1,800 mg (01/09/2018), 1,800 mg (01/23/2018), 1,800 mg (01/30/2018)  for chemotherapy treatment.     Subjective Data:  ECOG: 1 - Symptomatic but completely ambulatory   Subjective:     Cindy Bowman is a 68 y.o. female who presents for evaluation of abdominal pain. Onset was 4 days ago. Symptoms have been gradually worsening. The pain is described as aching and shooting, and is 10/10 in intensity. Pain is located in the epigastric region without radiation.  Aggravating factors: eating, movement and sitting up.  Alleviating factors: none. Associated symptoms: constipation, nausea and vomiting. The patient denies fever.  The patient's history has been marked as reviewed and updated as appropriate.  Review of Systems A comprehensive review of systems was negative except for: Constitutional: positive for anorexia, fatigue, malaise and weight loss Gastrointestinal: positive for abdominal pain, constipation, nausea and vomiting Musculoskeletal: positive for muscle weakness Neurological: positive for weakness     Objective:    BP 135/76 (BP Location: Right Arm, Patient Position: Sitting)   Pulse 93   Temp 97.6 F (36.4 C) (Tympanic)   Resp 18  General appearance: alert, fatigued and moderate distress Lungs: clear to auscultation bilaterally Heart: regular rate and rhythm, S1, S2 normal, no murmur, click, rub or gallop Abdomen: abnormal findings:  hypoactive bowel sounds and marked tenderness in the epigastrium Neurologic: Grossly normal    Assessment:    Abdominal pain, likely secondary to known pancreaticcancer .    Plan:    The diagnosis was discussed with the patient and evaluation and  treatment plans outlined. See orders for lab and imaging studies.    Stage Ib pancreatic adenocarcinoma: s/p single agent gemcitabine x3 cycles.  Last given on 02/19/2018.  Recently had fiduciary placement for SBRT at a scheduled to begin next week (04/09/18).  SBRT has been pushed back d/t several week hospital and tertiary hospital stay at [facility] for necrotizing pancreatitis requiring IV hydration, pain control and IV antibiotics.  Thought to possibly need debridement of pancreas.   Abdominal pain: Likely due to known adenocarcinoma of the pancreas.  Will get stat labs to rule out pancreatitis or acute issues.  Amylase and lipase slightly elevated likely due to recent pancreatitis; unlikely recurrent.  Will adjust medications; see below.  Dehydration: d/t abdominal pain, nausea and intermittent vomiting.  Vital signs stable.  Constipation: MiraLAX as prescribed.  In clinic administration: Give 1 L NaCl. Give 2 mg q 1 hour PRN for pain.  Stat labs. (K 3.0, hgb 8.9, albumin 2.5, lipase 60, amylase 131) Give 8 mg Zofran. Give 40 mEq potassium. Give 10 mg Decadron.  New prescriptions/change: RX oxycodone 15 mg 1 to 2 tablets every 3 hours PRN for pain.   Dispo:  RTC as scheduled after radiation on 04/23/2018. Continue Fentanyl patch 50 mcg q 72 hours.  Plan to follow-up with patient tomorrow to see if pain has improved.   Greater than 50% was spent in counseling and coordination of care with this patient including but not limited to discussion  of the relevant topics above (See A&P) including, but not limited to diagnosis and management of acute and chronic medical conditions.   Faythe Casa, NP 04/02/2018 4:18 PM

## 2018-04-03 ENCOUNTER — Telehealth: Payer: Self-pay | Admitting: *Deleted

## 2018-04-03 NOTE — Telephone Encounter (Signed)
Patient called to request a call from Lorretta Harp, NP stating that she was to let her know how many patches she has on hand. She reports she has 3 patches and also reports that every time she eats, she is having abdominal cramping, no diarrhea, just cramping pain.

## 2018-04-04 ENCOUNTER — Inpatient Hospital Stay: Payer: Medicare Other

## 2018-04-04 ENCOUNTER — Other Ambulatory Visit: Payer: Self-pay | Admitting: Oncology

## 2018-04-04 ENCOUNTER — Emergency Department: Payer: Medicare Other

## 2018-04-04 ENCOUNTER — Encounter: Payer: Self-pay | Admitting: Nurse Practitioner

## 2018-04-04 ENCOUNTER — Inpatient Hospital Stay (HOSPITAL_BASED_OUTPATIENT_CLINIC_OR_DEPARTMENT_OTHER): Payer: Medicare Other | Admitting: Nurse Practitioner

## 2018-04-04 ENCOUNTER — Ambulatory Visit
Admission: RE | Admit: 2018-04-04 | Discharge: 2018-04-04 | Disposition: A | Discharge status: Home / Self Care | Payer: Medicare Other | Source: Ambulatory Visit | Attending: Nurse Practitioner | Admitting: Nurse Practitioner

## 2018-04-04 ENCOUNTER — Inpatient Hospital Stay
Admission: EM | Admit: 2018-04-04 | Discharge: 2018-04-08 | DRG: 436 | Disposition: A | Discharge status: Home / Self Care | Payer: Medicare Other | Attending: Internal Medicine | Admitting: Internal Medicine

## 2018-04-04 ENCOUNTER — Other Ambulatory Visit: Payer: Self-pay

## 2018-04-04 ENCOUNTER — Encounter: Payer: Self-pay | Admitting: Emergency Medicine

## 2018-04-04 VITALS — BP 130/82 | HR 121 | Temp 99.0°F | Resp 20

## 2018-04-04 DIAGNOSIS — Z6823 Body mass index (BMI) 23.0-23.9, adult: Secondary | ICD-10-CM

## 2018-04-04 DIAGNOSIS — Z886 Allergy status to analgesic agent status: Secondary | ICD-10-CM

## 2018-04-04 DIAGNOSIS — Z86718 Personal history of other venous thrombosis and embolism: Secondary | ICD-10-CM | POA: Diagnosis not present

## 2018-04-04 DIAGNOSIS — G893 Neoplasm related pain (acute) (chronic): Secondary | ICD-10-CM

## 2018-04-04 DIAGNOSIS — R1013 Epigastric pain: Secondary | ICD-10-CM

## 2018-04-04 DIAGNOSIS — R110 Nausea: Secondary | ICD-10-CM | POA: Diagnosis not present

## 2018-04-04 DIAGNOSIS — K5900 Constipation, unspecified: Secondary | ICD-10-CM | POA: Diagnosis not present

## 2018-04-04 DIAGNOSIS — Z9221 Personal history of antineoplastic chemotherapy: Secondary | ICD-10-CM

## 2018-04-04 DIAGNOSIS — C259 Malignant neoplasm of pancreas, unspecified: Secondary | ICD-10-CM

## 2018-04-04 DIAGNOSIS — R1033 Periumbilical pain: Secondary | ICD-10-CM

## 2018-04-04 DIAGNOSIS — N289 Disorder of kidney and ureter, unspecified: Secondary | ICD-10-CM | POA: Diagnosis present

## 2018-04-04 DIAGNOSIS — Z880 Allergy status to penicillin: Secondary | ICD-10-CM

## 2018-04-04 DIAGNOSIS — E876 Hypokalemia: Secondary | ICD-10-CM | POA: Diagnosis not present

## 2018-04-04 DIAGNOSIS — R1084 Generalized abdominal pain: Secondary | ICD-10-CM

## 2018-04-04 DIAGNOSIS — E440 Moderate protein-calorie malnutrition: Secondary | ICD-10-CM

## 2018-04-04 DIAGNOSIS — K863 Pseudocyst of pancreas: Secondary | ICD-10-CM

## 2018-04-04 DIAGNOSIS — Z66 Do not resuscitate: Secondary | ICD-10-CM | POA: Diagnosis present

## 2018-04-04 DIAGNOSIS — Z7901 Long term (current) use of anticoagulants: Secondary | ICD-10-CM

## 2018-04-04 DIAGNOSIS — Z79891 Long term (current) use of opiate analgesic: Secondary | ICD-10-CM

## 2018-04-04 DIAGNOSIS — K859 Acute pancreatitis without necrosis or infection, unspecified: Secondary | ICD-10-CM

## 2018-04-04 DIAGNOSIS — R109 Unspecified abdominal pain: Secondary | ICD-10-CM | POA: Diagnosis not present

## 2018-04-04 DIAGNOSIS — Z9049 Acquired absence of other specified parts of digestive tract: Secondary | ICD-10-CM

## 2018-04-04 DIAGNOSIS — E860 Dehydration: Secondary | ICD-10-CM | POA: Diagnosis present

## 2018-04-04 DIAGNOSIS — I10 Essential (primary) hypertension: Secondary | ICD-10-CM | POA: Diagnosis present

## 2018-04-04 DIAGNOSIS — Z515 Encounter for palliative care: Secondary | ICD-10-CM

## 2018-04-04 DIAGNOSIS — Z8249 Family history of ischemic heart disease and other diseases of the circulatory system: Secondary | ICD-10-CM

## 2018-04-04 DIAGNOSIS — Z79899 Other long term (current) drug therapy: Secondary | ICD-10-CM

## 2018-04-04 DIAGNOSIS — Z95828 Presence of other vascular implants and grafts: Secondary | ICD-10-CM

## 2018-04-04 DIAGNOSIS — D638 Anemia in other chronic diseases classified elsewhere: Secondary | ICD-10-CM | POA: Diagnosis present

## 2018-04-04 LAB — AMYLASE: Amylase: 143 U/L — ABNORMAL HIGH (ref 28–100)

## 2018-04-04 LAB — CBC WITH DIFFERENTIAL/PLATELET
Abs Immature Granulocytes: 0.12 10*3/uL — ABNORMAL HIGH (ref 0.00–0.07)
Basophils Absolute: 0 10*3/uL (ref 0.0–0.1)
Basophils Relative: 0 %
Eosinophils Absolute: 0 10*3/uL (ref 0.0–0.5)
Eosinophils Relative: 0 %
HCT: 35.3 % — ABNORMAL LOW (ref 36.0–46.0)
Hemoglobin: 11.3 g/dL — ABNORMAL LOW (ref 12.0–15.0)
Immature Granulocytes: 1 %
Lymphocytes Relative: 13 %
Lymphs Abs: 2.6 10*3/uL (ref 0.7–4.0)
MCH: 32.8 pg (ref 26.0–34.0)
MCHC: 32 g/dL (ref 30.0–36.0)
MCV: 102.3 fL — ABNORMAL HIGH (ref 80.0–100.0)
Monocytes Absolute: 1.5 10*3/uL — ABNORMAL HIGH (ref 0.1–1.0)
Monocytes Relative: 7 %
Neutro Abs: 15.3 10*3/uL — ABNORMAL HIGH (ref 1.7–7.7)
Neutrophils Relative %: 79 %
Platelets: 318 10*3/uL (ref 150–400)
RBC: 3.45 MIL/uL — ABNORMAL LOW (ref 3.87–5.11)
RDW: 17.2 % — ABNORMAL HIGH (ref 11.5–15.5)
WBC: 19.5 10*3/uL — ABNORMAL HIGH (ref 4.0–10.5)
nRBC: 0 % (ref 0.0–0.2)

## 2018-04-04 LAB — COMPREHENSIVE METABOLIC PANEL
ALT: 14 U/L (ref 0–44)
AST: 27 U/L (ref 15–41)
Albumin: 2.6 g/dL — ABNORMAL LOW (ref 3.5–5.0)
Alkaline Phosphatase: 78 U/L (ref 38–126)
Anion gap: 10 (ref 5–15)
BUN: 13 mg/dL (ref 8–23)
CO2: 22 mmol/L (ref 22–32)
Calcium: 8.7 mg/dL — ABNORMAL LOW (ref 8.9–10.3)
Chloride: 108 mmol/L (ref 98–111)
Creatinine, Ser: 1.28 mg/dL — ABNORMAL HIGH (ref 0.44–1.00)
GFR calc Af Amer: 49 mL/min — ABNORMAL LOW (ref >60)
GFR calc non Af Amer: 43 mL/min — ABNORMAL LOW (ref >60)
Glucose, Bld: 164 mg/dL — ABNORMAL HIGH (ref 70–99)
Potassium: 3.9 mmol/L (ref 3.5–5.1)
Sodium: 140 mmol/L (ref 135–145)
Total Bilirubin: 0.9 mg/dL (ref 0.3–1.2)
Total Protein: 6.9 g/dL (ref 6.5–8.1)

## 2018-04-04 LAB — LIPASE, BLOOD: Lipase: 66 U/L — ABNORMAL HIGH (ref 11–51)

## 2018-04-04 MED ORDER — HYDROMORPHONE HCL 1 MG/ML IJ SOLN
1.0000 mg | Freq: Once | INTRAMUSCULAR | Status: AC
  Administered 2018-04-04: 1 mg via INTRAVENOUS
  Filled 2018-04-04: qty 1

## 2018-04-04 MED ORDER — HEPARIN SOD (PORK) LOCK FLUSH 100 UNIT/ML IV SOLN: 500.0000 [IU] | Freq: Once | INTRAVENOUS | Status: DC

## 2018-04-04 MED ORDER — HYDROMORPHONE HCL 2 MG PO TABS: 2.0000 mg | ORAL_TABLET | ORAL | 0 refills | Status: DC | PRN

## 2018-04-04 MED ORDER — HYDROMORPHONE HCL 1 MG/ML IJ SOLN
0.5000 mg | Freq: Once | INTRAMUSCULAR | Status: AC
  Administered 2018-04-04: 0.5 mg via INTRAVENOUS
  Filled 2018-04-04: qty 0.5

## 2018-04-04 MED ORDER — SODIUM CHLORIDE 0.9% FLUSH
10.0000 mL | INTRAVENOUS | Status: DC | PRN
  Filled 2018-04-04: qty 10

## 2018-04-04 MED ORDER — ONDANSETRON HCL 4 MG/2ML IJ SOLN
4.0000 mg | Freq: Once | INTRAMUSCULAR | Status: AC
  Administered 2018-04-04: 4 mg via INTRAVENOUS
  Filled 2018-04-04: qty 2

## 2018-04-04 MED ORDER — SODIUM CHLORIDE 0.9 % IV SOLN
Freq: Once | INTRAVENOUS | Status: AC
  Administered 2018-04-04: 14:00:00 via INTRAVENOUS
  Filled 2018-04-04: qty 250

## 2018-04-04 MED ORDER — SODIUM CHLORIDE 0.9 % IV BOLUS
1000.0000 mL | Freq: Once | INTRAVENOUS | Status: AC
  Administered 2018-04-04: 1000 mL via INTRAVENOUS

## 2018-04-04 MED ORDER — HYDROMORPHONE HCL 1 MG/ML IJ SOLN
2.0000 mg | INTRAMUSCULAR | Status: AC
  Administered 2018-04-04: 2 mg via INTRAVENOUS
  Filled 2018-04-04: qty 2

## 2018-04-04 MED ORDER — IOHEXOL 300 MG/ML  SOLN
75.0000 mL | Freq: Once | INTRAMUSCULAR | Status: AC | PRN
  Administered 2018-04-04: 75 mL via INTRAVENOUS

## 2018-04-04 MED ORDER — HYDROMORPHONE HCL 1 MG/ML IJ SOLN
1.0000 mg | INTRAMUSCULAR | Status: AC
  Administered 2018-04-05: 1 mg via INTRAVENOUS
  Filled 2018-04-04: qty 1

## 2018-04-04 MED ORDER — HALOPERIDOL LACTATE 5 MG/ML IJ SOLN
5.0000 mg | Freq: Once | INTRAMUSCULAR | Status: AC
  Administered 2018-04-04: 5 mg via INTRAVENOUS
  Filled 2018-04-04: qty 1

## 2018-04-04 NOTE — ED Triage Notes (Addendum)
Patient brought up from cancer center regarding abdominal pain that has been ongoing for greater than a week. Patient currently being treated for pancreatic cancer. Was seen by oncologist today and had labs done and a abdominal xray. Patient has WBC 19.5. Xray was negative. Patient was given 1 L NS and 2.5 mg of dilaudid total with no relief of symptoms. Patient reports mild nausea but denies diarrhea. Reports normal bowel movement today.

## 2018-04-04 NOTE — ED Notes (Signed)
Per Dr. Archie Balboa, patient does not need further blood work. Verbal order given for 5 mg of Haldol.

## 2018-04-04 NOTE — ED Provider Notes (Addendum)
[facility] Emergency Department Provider Note  ____________________________________________  Time seen: Approximately 11:15 PM  I have reviewed the triage vital signs and the nursing notes.   HISTORY  Chief Complaint Abdominal Pain    HPI Cindy Bowman is a 68 y.o. female with a history of hypertension, pancreatic cancer receiving chemotherapy treatment, and prior episode of pancreatitis who sent to the ED from cancer center due to increasing abdominal pain for the past week, inability to eat or drink for the past 2 days.  Pain is severe, 9/10 currently, sharp, radiates to the back.  Worse with oral intake, no alleviating factors.  Seen in cancer center earlier today, was given a liter of fluids, 2.5 mg of Dilaudid without significant improvement in symptoms.  Labs showed a white blood cell count of 19,000.  Patient was sent to the ED for imaging to ensure no surgical complication before being admitted for further management.      Past Medical History:  Diagnosis Date  . Essential hypertension   . Pancreatic cancer ([place])   . UTI (urinary tract infection)      Patient Active Problem List   Diagnosis Date Noted  . Protein-calorie malnutrition, severe 03/10/2018  . Acute pancreatitis 03/08/2018  . Chemotherapy induced neutropenia ([place]) 12/12/2017  . 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 Nausea & vomiting 11/16/2017  . Pancreatic adenocarcinoma ([place]) 10/18/2017     Past Surgical History:  Procedure Laterality Date  . CESAREAN SECTION    . CHOLECYSTECTOMY    . EUS N/A 10/25/2017   Procedure: FULL UPPER ENDOSCOPIC ULTRASOUND (EUS) RADIAL;  Surgeon: Jola Schmidt, MD;  Location: [facility] ENDOSCOPY;  Service: Endoscopy;  Laterality: N/A;  . OOPHORECTOMY    . PORTA CATH INSERTION N/A 11/12/2017   Procedure: PORTA CATH INSERTION;  Surgeon: Algernon Huxley, MD;  Location: [facility] CV LAB;  Service: Cardiovascular;  Laterality: N/A;  . TUBAL LIGATION        Prior to Admission medications   Medication Sig Start Date End Date Taking? Authorizing Provider  apixaban (ELIQUIS) 5 MG TABS tablet Take 1 tablet (5 mg total) by mouth 2 (two) times daily. 03/19/18  Yes Lloyd Huger, MD  folic acid (FOLVITE) 1 MG tablet Take by mouth. 03/17/18 03/17/19 Yes [provider]  HYDROmorphone (DILAUDID) 2 MG tablet Take 1 tablet (2 mg total) by mouth every 4 (four) hours as needed for severe pain. 04/04/18  Yes Jacquelin Hawking, NP  loperamide (IMODIUM) 2 MG capsule Take 2 mg by mouth as needed for diarrhea or loose stools.    Yes [provider]  losartan (COZAAR) 50 MG tablet Take 1 tablet (50 mg total) by mouth daily. 11/19/17  Yes Vaughan Basta, MD  NARCAN 4 MG/0.1ML LIQD nasal spray kit  03/21/18  Yes [provider]  ondansetron (ZOFRAN ODT) 4 MG disintegrating tablet Take 1 tablet (4 mg total) by mouth every 8 (eight) hours as needed for nausea or vomiting. 10/17/17  Yes Earleen Newport, MD  oxyCODONE (ROXICODONE) 15 MG immediate release tablet Take 1 - 2 tabs (15 - 30 mg) every 3 hours as needed for pain 04/02/18  Yes Burns, Wandra Feinstein, NP  prochlorperazine (COMPAZINE) 10 MG tablet Take by mouth.   Yes [provider]     Allergies Nsaids; Ibuprofen; and Penicillins   Family History  Problem Relation Age of Onset  . Hypertension Mother        deceased 25  . Heart attack Father  deceased late 78s    Social History Social History   Tobacco Use  . Smoking status: Never Smoker  . Smokeless tobacco: Never Used  Substance Use Topics  . Alcohol use: Not Currently  . Drug use: Never    Review of Systems  Constitutional:   No fever or chills.  ENT:   No sore throat. No rhinorrhea. Cardiovascular:   No chest pain or syncope. Respiratory:   No dyspnea or cough. Gastrointestinal:   Positive as above for abdominal pain without vomiting or constipation.  No diarrhea Musculoskeletal:    Negative for focal pain or swelling All other systems reviewed and are negative except as documented above in ROS and HPI.  ____________________________________________   PHYSICAL EXAM:  VITAL SIGNS: ED Triage Vitals  Enc Vitals Group     BP 04/04/18 1656 136/73     Pulse Rate 04/04/18 1656 (!) 123     Resp 04/04/18 1656 (!) 32     Temp 04/04/18 1656 98.1 F (36.7 C)     Temp Source 04/04/18 1656 Oral     SpO2 04/04/18 1656 98 %     Weight 04/04/18 1658 133 lb (60.3 kg)     Height 04/04/18 1658 _0  (1.6 m)     Head Circumference --      Peak Flow --      Pain Score 04/04/18 1658 8     Pain Loc --      Pain Edu? --      Excl. in [place]? --     Vital signs reviewed, nursing assessments reviewed.   Constitutional:   Alert and oriented. Non-toxic appearance. Eyes:   Conjunctivae are normal. EOMI. PERRL. ENT      Head:   Normocephalic and atraumatic.      Nose:   No congestion/rhinnorhea.       Mouth/Throat:   Dry mucous membranes, no pharyngeal erythema. No peritonsillar mass.       Neck:   No meningismus. Full ROM. Hematological/Lymphatic/Immunilogical:   No cervical lymphadenopathy. Cardiovascular:   Tachycardia heart rate 120. Symmetric bilateral radial and DP pulses.  No murmurs. Cap refill less than 2 seconds. Respiratory:   Normal respiratory effort without tachypnea/retractions. Breath sounds are clear and equal bilaterally. No wheezes/rales/rhonchi. Gastrointestinal:   Soft with diffuse upper abdominal tenderness most pronounced in the epigastrium. Non distended. There is no CVA tenderness.  No rebound, rigidity, or guarding. Musculoskeletal:   Normal range of motion in all extremities. No joint effusions.  No lower extremity tenderness.  No edema. Neurologic:   Normal speech and language.  Motor grossly intact. No acute focal neurologic deficits are appreciated.  Skin:    Skin is warm, dry and intact. No rash noted.  No petechiae, purpura, or  bullae.  ____________________________________________    LABS (pertinent positives/negatives) (all labs ordered are listed, but only abnormal results are displayed) Labs Reviewed - No data to display ____________________________________________   EKG    ____________________________________________    RADIOLOGY  Ct Abdomen Pelvis W Contrast  Result Date: 04/04/2018 CLINICAL DATA:  Abdominal pain, history of pancreatic carcinoma and elevated white blood cell count EXAM: CT ABDOMEN AND PELVIS WITH CONTRAST TECHNIQUE: Multidetector CT imaging of the abdomen and pelvis was performed using the standard protocol following bolus administration of intravenous contrast. CONTRAST:  68m OMNIPAQUE IOHEXOL 300 MG/ML  SOLN COMPARISON:  03/10/2018 FINDINGS: Lower chest: Left-sided pleural effusion is stable. Bibasilar mild atelectatic changes are again noted. Hepatobiliary: The liver is diffusely decreased  in attenuation consistent with fatty infiltration. The gallbladder has been surgically removed. No focal mass lesion in the liver is seen. Pancreas: There is a focal hypodense mass lesion with peripheral enhancement identified within the uncinate process of the pancreas consistent with the patient's given clinical history of pancreatic carcinoma. Adjacent fiducial markers are seen. The lesion abuts the superior mesenteric artery consistent with neoplasm and vascular encasement. Occupying the majority of the body of the pancreas there is a an ovoid fluid collection which measures 9.8 x 5.0 cm. 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 Peripheral enhancement is noted. The tail of the pancreas appears within normal limits. A few smaller peripancreatic fluid collections are identified consistent with pseudocysts. The largest of these smaller fluid collections is seen on image number 17 of series 2 in the splenic hilum measuring 3.5 cm in  greatest dimension. Spleen: Normal in size without focal abnormality. Adrenals/Urinary Tract: Adrenal glands are within normal limits. Kidneys are well visualized bilaterally. An enhancing mass lesion arising from the left kidney is again noted and stable. This did not demonstrate hypermetabolic nature on prior PET-CT. It again measures approximately 3.1 cm in dimension. No obstructive changes are seen. The bladder is decompressed. The known periurethral diverticulum on the right is less well appreciated due to the lack of contrast opacification. Stomach/Bowel: Diverticular change of the colon is noted without evidence of diverticulitis. The appendix is within normal limits. No obstructive change in the small bowel is identified. Vascular/Lymphatic: No significant vascular findings are present. No enlarged abdominal or pelvic lymph nodes. Reproductive: Uterus is within normal limits. No adnexal mass is seen. Other: Free fluid is noted within the pelvis as well as adjacent to the liver and spleen relatively stable from the previous exam. Fat containing periumbilical hernia is again seen and stable. Musculoskeletal: Degenerative changes of lumbar spine are noted. No definitive lytic or sclerotic lesions to suggest bony metastatic disease are seen. No compression deformities are noted. 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 Smaller pseudocysts are noted adjacent to the tail of the pancreas and spleen. Peripherally enhancing uncinate process mass consistent with the known history of pancreatic carcinoma. Stable ascites within the abdomen and pelvis. Stable left renal mass. Electronically Signed   By: Inez Catalina M.D.   On: 04/04/2018 23:20   Dg Abd 2 Views  Result Date: 04/04/2018 CLINICAL DATA:  Mid abdominal pain. EXAM: ABDOMEN - 2 VIEW COMPARISON:  Body CT 03/10/2018  FINDINGS: The bowel gas pattern is normal. There is no evidence of free air. No radio-opaque calculi or other significant radiographic abnormality is seen. Postsurgical changes in the right upper quadrant and mid abdomen. IMPRESSION: Nonobstructive bowel gas pattern.  No free gas seen. Electronically Signed   By: Fidela Salisbury M.D.   On: 04/04/2018 15:52    ____________________________________________   PROCEDURES Procedures  ____________________________________________  DIFFERENTIAL DIAGNOSIS   Pancreatitis, bowel perforation, obstruction, enteritis.  CLINICAL IMPRESSION / ASSESSMENT AND PLAN / ED COURSE  Pertinent labs & imaging results that were available during my care of the patient were reviewed by me and considered in my medical decision making (see chart for details).    Patient presents with severe abdominal pain, tachycardia, leukocytosis of 19,000.  Afebrile.  Presentation concerning for recurrent pancreatitis.  Doubt vascular pathology such as AAA dissection or mesenteric ischemia.  Based on negative LFTs, doubt choledocholithiasis or cholecystitis.  CT scan ordered to further evaluate.  If no complicated surgical issue, patient will be admitted for further management of pancreatitis.  Clinical Course as of Apr 05 10  Thu Apr 04, 2018  2331 CT shows evolution of previous necrotizing pancreatitis into large pancreatic pseudocyst.  Will discuss with general surgery regarding admission locally versus transfer back to [facility].   [PS]  2359 Dr. Dahlia Byes advises that patient would not be able to be managed surgically at [facility], and would benefit from being transferred back to [facility].  Discussed with [facility] transfer center to arrange.   [PS]    Clinical Course User Index [PS] Carrie Mew, MD     ____________________________________________   FINAL CLINICAL IMPRESSION(S) / ED DIAGNOSES    Final diagnoses:  Generalized abdominal pain  Acute pancreatitis,  unspecified complication status, unspecified pancreatitis type  Pancreatic pseudocyst  Epigastric pain     ED Discharge Orders    None      Portions of this note were generated with dragon dictation software. Dictation errors may occur despite best attempts at proofreading.   Carrie Mew, MD 04/04/18 2320    Carrie Mew, MD 04/05/18 Jen Mow    Carrie Mew, MD 04/05/18 [phone]

## 2018-04-04 NOTE — ED Notes (Signed)
Patient transported to CT 

## 2018-04-04 NOTE — Progress Notes (Signed)
Symptom Management [place]  [phone] [phone]  Patient Care Team: Glendon Axe, MD as PCP - General (Internal Medicine) Clent Jacks, RN as Registered Nurse Lloyd Huger, MD as Medical Oncologist (Medical Oncology)   Name of the patient: Cindy Bowman  [id number]  [birth year]   Date of visit: 04/04/18  Diagnosis-clinical stage Ib pancreatic adenocarcinoma  Chief complaint/ Reason for visit-abdominal pain  Heme/Onc history:  Cindy Bowman, initially presented to clinic as 68 year old female diagnosed with clinical stage Ib pancreatic adenocarcinoma.  EUS on 10/25/2017 confirmed adenocarcinoma.  PET scan on 10/30/2017 confirmed no disease outside pancreas.  Case was discussed at multidisciplinary case conference and lesion abutting SMA, therefore, neoadjuvant chemotherapy was recommended.  Surgery advised she was considered borderline resectable.  Initiated neoadjuvant FOLFIRINOX on 11/14/2017.  She completed 2 cycles.  Discontinued due to toxicity.  She initiated single agent gemcitabine on 12/26/2017. CT on 02/06/2018 reported as stable.  Radiation initiation delayed due to hospitalization.  Fiducial markers were placed for SBRT.  Radiation started on 03/26/2018.  Chemotherapy on hold during radiation.  She is scheduled to finish SBRT on 04/19/2018.   CA 19-9 78 10/17/2017 81 01/09/2018     Pancreatic adenocarcinoma ([place])   10/18/2017 Initial Diagnosis    Pancreatic adenocarcinoma ([place])    10/26/2017 Cancer Staging    Staging form: Exocrine Pancreas, AJCC 8th Edition - Clinical stage from 10/26/2017: Stage IB (cT2, cN0, cM0) - Signed by Lloyd Huger, MD on 10/26/2017    11/06/2017 - 12/11/2017 Chemotherapy    The patient had palonosetron (ALOXI) injection 0.25 mg, 0.25 mg, Intravenous,  Once, 2 of 4 cycles Administration: 0.25 mg (11/14/2017), 0.25 mg (11/28/2017) irinotecan (CAMPTOSAR) 340 mg in dextrose 5 % 500 mL chemo  infusion, 180 mg/m2 = 340 mg, Intravenous,  Once, 2 of 4 cycles Dose modification: 160 mg/m2 (original dose 180 mg/m2, Cycle 2, Reason: Dose not tolerated) Administration: 340 mg (11/14/2017), 300 mg (11/28/2017) leucovorin 750 mg in dextrose 5 % 250 mL infusion, 760 mg, Intravenous,  Once, 2 of 4 cycles Dose modification: 360 mg/m2 (original dose 400 mg/m2, Cycle 2, Reason: Dose not tolerated) Administration: 750 mg (11/14/2017), 700 mg (11/28/2017) oxaliplatin (ELOXATIN) 150 mg in dextrose 5 % 500 mL chemo infusion, 160 mg, Intravenous,  Once, 2 of 4 cycles Dose modification: 70 mg/m2 (original dose 85 mg/m2, Cycle 2, Reason: Dose not tolerated) Administration: 150 mg (11/14/2017), 135 mg (11/28/2017) fluorouracil (ADRUCIL) chemo injection 750 mg, 400 mg/m2 = 750 mg, Intravenous,  Once, 2 of 4 cycles Dose modification: 360 mg/m2 (original dose 400 mg/m2, Cycle 2, Reason: Dose not tolerated) Administration: 750 mg (11/14/2017), 700 mg (11/28/2017) fluorouracil (ADRUCIL) 4,550 mg in sodium chloride 0.9 % 59 mL chemo infusion, 2,400 mg/m2 = 4,550 mg, Intravenous, 1 Day/Dose, 2 of 4 cycles Dose modification: 2,000 mg/m2 (original dose 2,400 mg/m2, Cycle 2, Reason: Dose not tolerated) Administration: 4,550 mg (11/14/2017), 3,800 mg (11/28/2017)  for chemotherapy treatment.     12/26/2017 -  Chemotherapy    The patient had gemcitabine (GEMZAR) 1,800 mg in sodium chloride 0.9 % 250 mL chemo infusion, 1,786 mg, Intravenous,  Once, 3 of 4 cycles Administration: 1,800 mg (12/26/2017), 1,800 mg (01/02/2018), 1,800 mg (01/09/2018), 1,800 mg (01/23/2018), 1,800 mg (01/30/2018)  for chemotherapy treatment.      Interval history- [name], 68 year old female with above history of pancreatic cancer who presents to symptom management clinic for abdominal pain.  States pain began worsening last  night and today rates 10 of 10.  Describes as sharp and localizes to site of ventral hernia that radiates to epigastric  abdomen. Sleep was impaired due to pain despite current pain regimen.  Pain has been worsening since onset.  Nothing seems to make pain better.  Pain worsened by movement or pressure to abdomen. One episode of emesis. Nausea since that time. No oral intake. Last BM was last night, described as normal in texture, yellow.  Has been taking fentanyl 50 mcg patch with oxycodone for breakthrough.  Due to poor pain control, Dilaudid 2 mg p.o. every 3 hours as needed was added and to be alternated with oxycodone 50-30 mg every 3 hours as needed.  She was referred to IR for consideration of celiac plexus block which was scheduled for next week.  She is also scheduled to begin radiation on 04/09/2018.  She was admitted to [facility] for abdominal pain on 03/08/2018 immediately following EUS for placement of fiducial markers to pancreatic adenocarcinoma for radiation therapy planning.  EUS was performed at [facility] earlier that day.  Admitted with elevated lipase and pain.  She received IV fluids, pain medications, and antiemetics for 2 days prior to worsening WBCs from 19-30.  Repeat CT abdomen showed severe acute necrotizing pancreatitis with edema extending into the anterior perinephric retroperitoneal spaces bilaterally.  Absence of pancreatic parenchymal enhancement throughout the pancreatic body and much of the pancreatic tail.  Also notably, stable avidly enhancing exophytic 3.1 cm lateral interpolar left renal mass compatible with renal cell carcinoma.  No findings of metastatic disease in the abdomen or pelvis.  Given acutely elevated WBCs she was started on vancomycin and cefepime and transferred to [facility] for consideration of operative debridement. Imaging on 03/12/2018 revealed acute extensive necrotizing pancreatitis with markedly increased necrotic component relative to 03/08/2018 imaging with increased narrowing of superior mesenteric vein without thrombosis.   She was previously seen in symptom management clinic on  04/02/2018 for pain thought to be associated with malignancy. She received IV pain medication and fluids was discharged home. WBC 8.9. Amylase 131 (elevated but improved from prior), lipase 60 (elevated but improved from prior).    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      Review of systems- Review of Systems  Constitutional: Positive for malaise/fatigue. Negative for chills and fever.  HENT: Negative.   Eyes: Negative.   Respiratory: Negative for cough, hemoptysis, sputum production, shortness of breath and wheezing.   Cardiovascular: Negative for chest pain, palpitations, orthopnea, claudication, leg swelling and PND.  Gastrointestinal: Positive for abdominal pain and nausea. Negative for blood in stool, constipation, diarrhea, heartburn, melena and vomiting.  Genitourinary: Negative for dysuria, flank pain, frequency, hematuria and urgency.  Musculoskeletal: Positive for joint pain (left shoulder). Negative for back pain, falls, myalgias and neck pain.  Skin: Negative.   Neurological: Negative.   Psychiatric/Behavioral: Negative.      Current treatment- s/p cycle 3-day 1 single agent gemcitabine on 02/19/2018. Fiduciaryies placed for SBRT and received 3 cycles (day 1, day 8, day 15)   Allergies  Allergen Reactions  . Nsaids Other (See Comments)    Decreased GFR  . Ibuprofen     Lowers kidney function  . Penicillins     Yeast infection  Has patient had a PCN reaction causing immediate rash, facial/tongue/throat swelling, SOB or lightheadedness with hypotension: No Has patient had a PCN reaction causing severe rash involving mucus membranes or skin necrosis: No Has patient had a  PCN reaction that required hospitalization: No Has patient had a PCN reaction occurring within the last 10 years: No If all of the above answers are "NO", then may proceed with Cephalosporin use.     Past Medical History:  Diagnosis Date  . Essential hypertension    . Pancreatic cancer ([place])   . UTI (urinary tract infection)     Past Surgical History:  Procedure Laterality Date  . CESAREAN SECTION    . CHOLECYSTECTOMY    . EUS N/A 10/25/2017   Procedure: FULL UPPER ENDOSCOPIC ULTRASOUND (EUS) RADIAL;  Surgeon: Jola Schmidt, MD;  Location: [facility] ENDOSCOPY;  Service: Endoscopy;  Laterality: N/A;  . OOPHORECTOMY    . PORTA CATH INSERTION N/A 11/12/2017   Procedure: PORTA CATH INSERTION;  Surgeon: Algernon Huxley, MD;  Location: [place] CV LAB;  Service: Cardiovascular;  Laterality: N/A;  . TUBAL LIGATION      Social History   Socioeconomic History  . Marital status: Single    Spouse name: Not on file  . Number of children: Not on file  . Years of education: Not on file  . Highest education level: Not on file  Occupational History  . Not on file  Social Needs  . Financial resource strain: Not on file  . Food insecurity:    Worry: Not on file    Inability: Not on file  . Transportation needs:    Medical: No    Non-medical: No  Tobacco Use  . Smoking status: Never Smoker  . Smokeless tobacco: Never Used  Substance and Sexual Activity  . Alcohol use: Not Currently  . Drug use: Never  . Sexual activity: Not on file  Lifestyle  . Physical activity:    Days per week: Not on file    Minutes per session: Not on file  . Stress: Not on file  Relationships  . Social connections:    Talks on phone: Not on file    Gets together: Not on file    Attends religious service: Not on file    Active member of club or organization: Not on file    Attends meetings of clubs or organizations: Not on file    Relationship status: Not on file  . Intimate partner violence:    Fear of current or ex partner: Not on file    Emotionally abused: Not on file    Physically abused: Not on file    Forced sexual activity: Not on file  Other Topics Concern  . Not on file  Social History Narrative  . Not on file    Family History  Problem Relation Age of  Onset  . Hypertension Mother        deceased 52  . Heart attack Father        deceased late 51s     Current Outpatient Medications:  .  apixaban (ELIQUIS) 5 MG TABS tablet, Take 1 tablet (5 mg total) by mouth 2 (two) times daily., Disp: 60 tablet, Rfl: 3 .  fentaNYL (DURAGESIC) 50 MCG/HR, Place 1 patch onto the skin every 3 (three) days for 3 days., Disp: 5 patch, Rfl: 0 .  folic acid (FOLVITE) 1 MG tablet, Take by mouth., Disp: , Rfl:  .  losartan (COZAAR) 50 MG tablet, Take 1 tablet (50 mg total) by mouth daily., Disp: 30 tablet, Rfl: 0 .  NARCAN 4 MG/0.1ML LIQD nasal spray kit, , Disp: , Rfl:  .  ondansetron (ZOFRAN ODT) 4 MG disintegrating tablet, Take  1 tablet (4 mg total) by mouth every 8 (eight) hours as needed for nausea or vomiting., Disp: 20 tablet, Rfl: 0 .  oxyCODONE (ROXICODONE) 15 MG immediate release tablet, Take 1 - 2 tabs (15 - 30 mg) every 3 hours as needed for pain, Disp: 60 tablet, Rfl: 0 .  prochlorperazine (COMPAZINE) 10 MG tablet, Take by mouth., Disp: , Rfl:  .  HYDROmorphone (DILAUDID) 2 MG tablet, Take 1 tablet (2 mg total) by mouth every 4 (four) hours as needed for severe pain., Disp: 30 tablet, Rfl: 0 .  loperamide (IMODIUM) 2 MG capsule, Take 2 mg by mouth as needed for diarrhea or loose stools. , Disp: , Rfl:   Current Facility-Administered Medications:  .  heparin lock flush 100 unit/mL, 500 Units, Intravenous, Once, Burns, Jennifer E, NP .  sodium chloride flush (NS) 0.9 % injection 10 mL, 10 mL, Intravenous, PRN, Burns, Wandra Feinstein, NP  Facility-Administered Medications Ordered in Other Visits:  .  prochlorperazine (COMPAZINE) injection 10 mg, 10 mg, Intravenous, Once, Rexene Agent  Physical exam:  Vitals:   04/04/18 1411  BP: 130/82  Pulse: (!) 121  Resp: 20  Temp: 99 F (37.2 C)  TempSrc: Tympanic   Physical Exam Constitutional:      General: She is in acute distress (painful appearing; holding stomach).     Comments: Thin build. Accompanied  by son.   HENT:     Mouth/Throat:     Mouth: Mucous membranes are moist.     Pharynx: Oropharynx is clear.  Eyes:     General: No scleral icterus. Cardiovascular:     Rate and Rhythm: Regular rhythm. Tachycardia present.  Pulmonary:     Effort: Pulmonary effort is normal. No respiratory distress.     Breath sounds: Normal breath sounds.  Abdominal:     General: There are no signs of injury.     Palpations: Abdomen is soft.     Tenderness: There is abdominal tenderness in the epigastric area and periumbilical area. There is guarding. There is no right CVA tenderness or left CVA tenderness.     Hernia: A hernia is present.     Comments: No bruising or discoloration on flanks. No bruising at umbilicus.   Skin:    General: Skin is warm and dry.  Neurological:     Mental Status: She is alert.      CMP Latest Ref Rng & Units 04/04/2018  Glucose 70 - 99 mg/dL 164(H)  BUN 8 - 23 mg/dL 13  Creatinine 0.44 - 1.00 mg/dL 1.28(H)  Sodium 135 - 145 mmol/L 140  Potassium 3.5 - 5.1 mmol/L 3.9  Chloride 98 - 111 mmol/L 108  CO2 22 - 32 mmol/L 22  Calcium 8.9 - 10.3 mg/dL 8.7(L)  Total Protein 6.5 - 8.1 g/dL 6.9  Total Bilirubin 0.3 - 1.2 mg/dL 0.9  Alkaline Phos 38 - 126 U/L 78  AST 15 - 41 U/L 27  ALT 0 - 44 U/L 14   CBC Latest Ref Rng & Units 04/04/2018  WBC 4.0 - 10.5 K/uL 19.5(H)  Hemoglobin 12.0 - 15.0 g/dL 11.3(L)  Hematocrit 36.0 - 46.0 % 35.3(L)  Platelets 150 - 400 K/uL 318    No images are attached to the encounter.  Ct Abdomen Pelvis W Contrast  Result Date: 03/10/2018 CLINICAL DATA:  Inpatient. 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 Upper endoscopy with placement of radiation fiducial markers on 03/08/2018, now with nausea, vomiting and abdominal pain.  EXAM: CT ABDOMEN AND PELVIS WITH CONTRAST TECHNIQUE: Multidetector CT imaging of the abdomen and pelvis was performed using the standard protocol following bolus administration of  intravenous contrast. CONTRAST:  75m OMNIPAQUE IOHEXOL 300 MG/ML  SOLN COMPARISON:  03/08/2018 CT abdomen/pelvis. FINDINGS: Lower chest: Small dependent bilateral pleural effusions, left greater than right, new. Moderate bibasilar atelectasis. Tip of superior approach central venous catheter is seen near the cavoatrial junction. Hepatobiliary: Diffuse hepatic steatosis. No liver masses. Cholecystectomy. No biliary ductal dilatation. Pancreas: Hypoenhancing 2.3 x 2.1 cm uncinate process pancreatic mass (series 2/image 31), unchanged. Fiducial markers are again noted along right margin of the pancreatic mass. Diffuse thickening of the pancreas with prominent peripancreatic edema compatible with acute pancreatitis. There is non enhancement of the pancreatic parenchyma throughout the pancreatic body and much of the tail, compatible with necrotizing pancreatitis. No appreciable pancreatic duct dilation. Spleen: Normal size. No mass. Adrenals/Urinary Tract: Normal adrenals. Heterogeneously avidly enhancing exophytic 3.1 cm renal cortical mass in the lateral interpolar left kidney, unchanged. No hydronephrosis. Normal bladder. Right periurethral 2.1 x 1.2 cm diverticulum now containing excreted contrast (series 2/image 80), unchanged. Stomach/Bowel: Stomach is nondistended. There is fold thickening throughout fundus and body of the stomach, not definitely changed. Normal caliber small bowel with no small bowel wall thickening. Normal appendix. Moderate sigmoid diverticulosis, with no definite large bowel wall thickening. Vascular/Lymphatic: Normal caliber abdominal aorta. Patent hepatic and renal veins with no renal vein tumor thrombus. Stable at least moderate narrowing of portal splenic venous confluence by the mass. Reproductive: Grossly normal uterus.  No adnexal mass. Other: No pneumoperitoneum. Stable moderate periumbilical fat containing hernia. Small to moderate volume ascites is increased. Retroperitoneal fluid  in left greater than right anterior paranephric spaces is mildly decreased. New mild anasarca. No focal measurable fluid collections. Musculoskeletal: No aggressive appearing focal osseous lesions. Moderate thoracolumbar spondylosis. IMPRESSION: 1. Severe acute necrotizing pancreatitis with edema extending into the anterior paranephric retroperitoneal spaces bilaterally. Absence of pancreatic parenchymal enhancement throughout the pancreatic body and much of the pancreatic tail. 2. Pancreatic neoplasm in the uncinate process is unchanged. 3. Worsening third-spacing of fluid with new small dependent bilateral pleural effusions, new mild anasarca and increased small to moderate volume ascites. 4. Fold thickening throughout stomach is nonspecific and probably reactive or due to third spacing of fluid. 5. Stable avidly enhancing exophytic 3.1 cm lateral interpolar left renal mass compatible with renal cell carcinoma. 6. No findings of metastatic disease in the abdomen or pelvis. 7. Diffuse hepatic steatosis. 8. Stable right urethral diverticulum. Electronically Signed   By: JIlona SorrelM.D.   On: 03/10/2018 11:56   Ct Abdomen Pelvis W Contrast  Result Date: 03/08/2018 CLINICAL DATA:  Endoscopy today with increasing nausea and bilious vomiting. Patient is currently on radiation therapy for pancreatic cancer. EXAM: CT ABDOMEN AND PELVIS WITH CONTRAST TECHNIQUE: Multidetector CT imaging of the abdomen and pelvis was performed using the standard protocol following bolus administration of intravenous contrast. CONTRAST:  736mOMNIPAQUE IOHEXOL 300 MG/ML  SOLN COMPARISON:  February 15, 2018 FINDINGS: Lower chest: There is mild atelectasis of the lung bases. The heart size is normal. Hepatobiliary: Diffuse low density of the liver without vessel displacement is identified. No focal liver lesion is noted. Patient status post prior cholecystectomy. The intra and extrahepatic biliary ducts are normal. Pancreas: Surgical  clips with associated hypodense mass is identified in the pancreatic head consistent with patient's known pancreatic cancer. Spleen: Normal in size without focal abnormality. Adrenals/Urinary Tract: The bilateral adrenal  glands are normal. The right kidney is normal. There is a heterogeneous enhancing partial cystic mass in the lateral midpole left kidney measuring 2.8 x 3 cm unchanged compared prior exam. Fluid-filled bladder is normal. Stomach/Bowel: There is hiatal hernia. There is mild diffuse bowel wall thickening of the stomach with surrounding fluid and inflammatory change. There is no small bowel obstruction. There is diverticulosis of colon without diverticulitis. The appendix is not definitely seen. Vascular/Lymphatic: No significant vascular findings are present. No enlarged abdominal or pelvic lymph nodes. Reproductive: Uterus and bilateral adnexa are unremarkable. Other: Moderate ascites is identified in the abdomen and pelvis. There is midline umbilical herniation of mesenteric fat. There is no free air. Musculoskeletal: Degenerative joint changes of the spine are noted. IMPRESSION: Diffuse bowel wall thickening of the stomach. This is nonspecific. Finding can be due to infectious/inflammatory process. No free air. Mass in the pancreatic head consistent with patient's known pancreatic cancer. Moderate ascites in the abdomen and pelvis. Heterogeneous enhancing partial cystic mass in the lateral midpole left kidney unchanged suspicious for renal cell carcinoma. Electronically Signed   By: Abelardo Diesel M.D.   On: 03/08/2018 18:15   Dg Chest Port 1 View  Result Date: 03/10/2018 CLINICAL DATA:  68 year old with pneumonia. EXAM: PORTABLE CHEST 1 VIEW COMPARISON:  PET-CT 10/30/2017 FINDINGS: New patchy densities at the left lung base and difficult to exclude small left pleural effusion. Few densities along the medial right lung base. Upper lungs are clear. Heart size is within normal limits. Right  jugular Port-A-Cath is present. Catheter tip is near the SVC and right atrium junction. Negative for a pneumothorax. Surgical clips in the right upper abdomen. IMPRESSION: Bibasilar chest densities, left side greater than right. Findings concerning for infection, particularly on the left side. Can not exclude small left pleural effusion. Electronically Signed   By: Markus Daft M.D.   On: 03/10/2018 09:06   Dg Abd 2 Views  Result Date: 04/04/2018 CLINICAL DATA:  Mid abdominal pain. EXAM: ABDOMEN - 2 VIEW COMPARISON:  Body CT 03/10/2018 FINDINGS: The bowel gas pattern is normal. There is no evidence of free air. No radio-opaque calculi or other significant radiographic abnormality is seen. Postsurgical changes in the right upper quadrant and mid abdomen. IMPRESSION: Nonobstructive bowel gas pattern.  No free gas seen. Electronically Signed   By: Fidela Salisbury M.D.   On: 04/04/2018 15:52    Assessment and plan- Patient is a 68 y.o. female diagnosed with pancreatic cancer who presents to symptom management clinic for abdominal pain.   1.  Abdominal pain- etiology unclear-necrotizing pancreatitis?  Pancreatic pseudocyst vs WOPN? Labs in clinic reviewed and discussed with patient. LFTs not elevated so obstruction less likely. WBC elevated at 19.5 with left shift. Amylase and Lipase increased compared to 04/02/18 visit but significantly lower than previously during hospitalization for acute pancreatitis. X-ray today, negative for acute finding.  IV fluids in clinic. Received IV dilaudid (total of 2.5 mg) with minimal improvement in pain.   2. Pancreatic adenocarcinoma- stage Ib s/p 2 cycles neoadjuvant FOLFIRINOX switched to single agent gemcitabine due to toxicity/intolerance.  Per surgery, borderline resectable.  Fiducial markers in place for SBRT started 03/26/2018. Continue to monitor.   3.  Pain associated with malignancy-pain acutely worse due to above.  Currently taking fentanyl 50 mcg patch with  oxycodone.  Dilaudid p.o. added today to be alternated with oxycodone for breakthrough.  Could also consider increasing fentanyl though given her thin build I suspect she may need to  be switched to MS Contin similar.  She was referred to IR for consideration of celiac plexus block which is been scheduled for next week.  4. History of DVT- on eliquis  Hospitalization recommended by Dr. Grayland Ormond. Discussed with Dr. Darvin Neighbours, hospitalist, who recommended ER for stat ct/possible transfer to [facility]. Patient and family agreeable. Report called to ER and patient transferred by nursing.   Visit Diagnosis 1. Pancreatic adenocarcinoma ([place])   2. Periumbilical abdominal pain   3. Port-A-Cath in place     Patient expressed understanding and was in agreement with this plan. She also understands that She can call clinic at any time with any questions, concerns, or complaints.   Thank you for allowing me to participate in the care of this very pleasant patient.   Beckey Rutter, DNP, AGNP-C [facility] (work cell) [phone] (office)  CC: Dr. Grayland Ormond

## 2018-04-04 NOTE — Progress Notes (Signed)
Spoke to [employer], Palliative NP and patient may be a candidate for a Celiac Plexus block. Pain is 10/10 and unrelieved with current narcotic regemen that has recently been adjusted.  She is also scheduled to begin radiation on 04/09/2018 which may help relieve some pain.  Placed referral for interventional radiology for possible celiac plexus block.   Additionally, will make further adjustments to narcotics per recommendations of palliative nurse practitioner.  New prescription: RX Dilaudid 2 mg every 3 hours as needed for pain.  This to be rotated with her oxycodone 15-30 mg q 3 hours.  Patient educated on narcotic pain medication adherence.  Patient has Narcan on hand if needed.   She was seen in [facility] on Tuesday with laboratory work, IV pain medication and IV fluids.  Previously admitted to the hospital for necrotizing pancreatitis.  Lipase and amylase were mildly elevated at last lab draw.  If we can get patient to cancer center, I would like to get imaging of her abdomen.  Faythe Casa, NP 04/04/2018 12:44 PM

## 2018-04-04 NOTE — Telephone Encounter (Signed)
Spoke to patient and she is in 10/10 abdominal pain. Offered an appt in [facility] today but she declined. She is asking for further adjustment to her pain medication. Recently adjusted her fentanyle patch from 25 mcg to 50 mcg every 3 days and increased frequency and dose of short acting narcotic. Really think she may need to be seen with possible imaging.

## 2018-04-05 ENCOUNTER — Other Ambulatory Visit: Payer: Self-pay

## 2018-04-05 DIAGNOSIS — R109 Unspecified abdominal pain: Secondary | ICD-10-CM | POA: Diagnosis not present

## 2018-04-05 DIAGNOSIS — E440 Moderate protein-calorie malnutrition: Secondary | ICD-10-CM | POA: Diagnosis present

## 2018-04-05 DIAGNOSIS — Z880 Allergy status to penicillin: Secondary | ICD-10-CM | POA: Diagnosis not present

## 2018-04-05 DIAGNOSIS — K863 Pseudocyst of pancreas: Secondary | ICD-10-CM

## 2018-04-05 DIAGNOSIS — Z79899 Other long term (current) drug therapy: Secondary | ICD-10-CM | POA: Diagnosis not present

## 2018-04-05 DIAGNOSIS — Z9049 Acquired absence of other specified parts of digestive tract: Secondary | ICD-10-CM | POA: Diagnosis not present

## 2018-04-05 DIAGNOSIS — D638 Anemia in other chronic diseases classified elsewhere: Secondary | ICD-10-CM | POA: Diagnosis present

## 2018-04-05 DIAGNOSIS — Z6823 Body mass index (BMI) 23.0-23.9, adult: Secondary | ICD-10-CM | POA: Diagnosis not present

## 2018-04-05 DIAGNOSIS — Z66 Do not resuscitate: Secondary | ICD-10-CM | POA: Diagnosis present

## 2018-04-05 DIAGNOSIS — N289 Disorder of kidney and ureter, unspecified: Secondary | ICD-10-CM | POA: Diagnosis not present

## 2018-04-05 DIAGNOSIS — Z79891 Long term (current) use of opiate analgesic: Secondary | ICD-10-CM | POA: Diagnosis not present

## 2018-04-05 DIAGNOSIS — Z7901 Long term (current) use of anticoagulants: Secondary | ICD-10-CM | POA: Diagnosis not present

## 2018-04-05 DIAGNOSIS — E876 Hypokalemia: Secondary | ICD-10-CM | POA: Diagnosis not present

## 2018-04-05 DIAGNOSIS — D72829 Elevated white blood cell count, unspecified: Secondary | ICD-10-CM | POA: Diagnosis not present

## 2018-04-05 DIAGNOSIS — Z515 Encounter for palliative care: Secondary | ICD-10-CM

## 2018-04-05 DIAGNOSIS — G893 Neoplasm related pain (acute) (chronic): Secondary | ICD-10-CM | POA: Diagnosis not present

## 2018-04-05 DIAGNOSIS — C259 Malignant neoplasm of pancreas, unspecified: Secondary | ICD-10-CM | POA: Diagnosis not present

## 2018-04-05 DIAGNOSIS — Z886 Allergy status to analgesic agent status: Secondary | ICD-10-CM | POA: Diagnosis not present

## 2018-04-05 DIAGNOSIS — Z8249 Family history of ischemic heart disease and other diseases of the circulatory system: Secondary | ICD-10-CM | POA: Diagnosis not present

## 2018-04-05 DIAGNOSIS — K859 Acute pancreatitis without necrosis or infection, unspecified: Secondary | ICD-10-CM | POA: Diagnosis not present

## 2018-04-05 DIAGNOSIS — Z9221 Personal history of antineoplastic chemotherapy: Secondary | ICD-10-CM | POA: Diagnosis not present

## 2018-04-05 DIAGNOSIS — I10 Essential (primary) hypertension: Secondary | ICD-10-CM | POA: Diagnosis present

## 2018-04-05 MED ORDER — HYDROMORPHONE HCL 1 MG/ML IJ SOLN
1.0000 mg | INTRAMUSCULAR | Status: DC | PRN
  Administered 2018-04-05 – 2018-04-08 (×16): 1 mg via INTRAVENOUS
  Filled 2018-04-05 (×17): qty 1

## 2018-04-05 MED ORDER — DOCUSATE SODIUM 100 MG PO CAPS
100.0000 mg | ORAL_CAPSULE | Freq: Two times a day (BID) | ORAL | Status: DC
  Administered 2018-04-05: 100 mg via ORAL
  Filled 2018-04-05 (×4): qty 1

## 2018-04-05 MED ORDER — NALOXONE HCL 2 MG/2ML IJ SOSY
0.4000 mg | PREFILLED_SYRINGE | INTRAMUSCULAR | Status: DC | PRN
  Filled 2018-04-05: qty 2

## 2018-04-05 MED ORDER — ACETAMINOPHEN 650 MG RE SUPP: 650.0000 mg | Freq: Four times a day (QID) | RECTAL | Status: DC | PRN

## 2018-04-05 MED ORDER — ORAL CARE MOUTH RINSE
15.0000 mL | Freq: Two times a day (BID) | OROMUCOSAL | Status: DC
  Administered 2018-04-05 – 2018-04-08 (×5): 15 mL via OROMUCOSAL

## 2018-04-05 MED ORDER — APIXABAN 5 MG PO TABS
5.0000 mg | ORAL_TABLET | Freq: Two times a day (BID) | ORAL | Status: DC
  Administered 2018-04-05 – 2018-04-08 (×6): 5 mg via ORAL
  Filled 2018-04-05 (×6): qty 1

## 2018-04-05 MED ORDER — ALBUTEROL SULFATE (2.5 MG/3ML) 0.083% IN NEBU: 2.5000 mg | INHALATION_SOLUTION | Freq: Four times a day (QID) | RESPIRATORY_TRACT | Status: DC

## 2018-04-05 MED ORDER — HYDROMORPHONE HCL 2 MG PO TABS
2.0000 mg | ORAL_TABLET | ORAL | Status: DC | PRN
  Administered 2018-04-06: 14:00:00 2 mg via ORAL
  Administered 2018-04-08: 4 mg via ORAL
  Filled 2018-04-05: qty 2
  Filled 2018-04-05: qty 1

## 2018-04-05 MED ORDER — POLYETHYLENE GLYCOL 3350 17 G PO PACK
17.0000 g | PACK | Freq: Every day | ORAL | Status: DC | PRN
  Filled 2018-04-05: qty 1

## 2018-04-05 MED ORDER — ONDANSETRON HCL 4 MG/2ML IJ SOLN
4.0000 mg | Freq: Four times a day (QID) | INTRAMUSCULAR | Status: DC | PRN
  Administered 2018-04-07: 08:00:00 4 mg via INTRAVENOUS
  Filled 2018-04-05: qty 2

## 2018-04-05 MED ORDER — PROCHLORPERAZINE MALEATE 10 MG PO TABS
10.0000 mg | ORAL_TABLET | Freq: Three times a day (TID) | ORAL | Status: DC | PRN
  Filled 2018-04-05: qty 1

## 2018-04-05 MED ORDER — METOPROLOL TARTRATE 5 MG/5ML IV SOLN: 2.5000 mg | Freq: Four times a day (QID) | INTRAVENOUS | Status: DC | PRN

## 2018-04-05 MED ORDER — ADULT MULTIVITAMIN W/MINERALS CH
1.0000 | ORAL_TABLET | Freq: Every day | ORAL | Status: DC
  Administered 2018-04-05 – 2018-04-08 (×4): 1 via ORAL
  Filled 2018-04-05 (×4): qty 1

## 2018-04-05 MED ORDER — ONDANSETRON HCL 4 MG PO TABS: 4.0000 mg | ORAL_TABLET | Freq: Four times a day (QID) | ORAL | Status: DC | PRN

## 2018-04-05 MED ORDER — ALBUTEROL SULFATE (2.5 MG/3ML) 0.083% IN NEBU: 2.5000 mg | INHALATION_SOLUTION | RESPIRATORY_TRACT | Status: DC | PRN

## 2018-04-05 MED ORDER — ACETAMINOPHEN 325 MG PO TABS: 650.0000 mg | ORAL_TABLET | Freq: Four times a day (QID) | ORAL | Status: DC | PRN

## 2018-04-05 MED ORDER — FOLIC ACID 1 MG PO TABS
1.0000 mg | ORAL_TABLET | Freq: Every day | ORAL | Status: DC
  Administered 2018-04-05 – 2018-04-08 (×4): 1 mg via ORAL
  Filled 2018-04-05 (×4): qty 1

## 2018-04-05 MED ORDER — FLEET ENEMA 7-19 GM/118ML RE ENEM: 1.0000 | ENEMA | Freq: Once | RECTAL | Status: DC | PRN

## 2018-04-05 MED ORDER — BOOST / RESOURCE BREEZE PO LIQD CUSTOM
1.0000 | Freq: Three times a day (TID) | ORAL | Status: DC
  Administered 2018-04-05: 1 via ORAL

## 2018-04-05 MED ORDER — TEMAZEPAM 15 MG PO CAPS: 15.0000 mg | ORAL_CAPSULE | Freq: Every evening | ORAL | Status: DC | PRN

## 2018-04-05 MED ORDER — DEXTROSE-NACL 5-0.45 % IV SOLN
INTRAVENOUS | Status: DC
  Administered 2018-04-05 – 2018-04-07 (×4): via INTRAVENOUS

## 2018-04-05 MED ORDER — LOSARTAN POTASSIUM 50 MG PO TABS
50.0000 mg | ORAL_TABLET | Freq: Every day | ORAL | Status: DC
  Administered 2018-04-05 – 2018-04-08 (×4): 50 mg via ORAL
  Filled 2018-04-05 (×4): qty 1

## 2018-04-05 MED ORDER — ENOXAPARIN SODIUM 40 MG/0.4ML ~~LOC~~ SOLN: 40.0000 mg | SUBCUTANEOUS | Status: DC

## 2018-04-05 MED ORDER — LOPERAMIDE HCL 2 MG PO CAPS: 2.0000 mg | ORAL_CAPSULE | ORAL | Status: DC | PRN

## 2018-04-05 MED ORDER — FENTANYL 50 MCG/HR TD PT72: 1.0000 | MEDICATED_PATCH | TRANSDERMAL | Status: DC

## 2018-04-05 MED ORDER — OXYCODONE HCL 5 MG PO TABS: 15.0000 mg | ORAL_TABLET | Freq: Four times a day (QID) | ORAL | Status: DC | PRN

## 2018-04-05 MED ORDER — BISACODYL 10 MG RE SUPP: 10.0000 mg | Freq: Every day | RECTAL | Status: DC | PRN

## 2018-04-05 NOTE — Consult Note (Signed)
[place]  [phone]) [phone] [phone]) [phone]  ID: Cindy Bowman OB: 07-02-1950  MR#: [id number]  INO#:[id number]  Patient Care Team: Glendon Axe, MD as PCP - General (Internal Medicine) Clent Jacks, RN as Registered Nurse Lloyd Huger, MD as Medical Oncologist (Medical Oncology)  CHIEF COMPLAINT: Pancreatic cancer, intractable pain.  INTERVAL HISTORY: Patient is a 68 year old female with a history of stage Ib pancreatic adenocarcinoma who last received chemotherapy with single agent gemcitabine on February 26, 2018.  More recently, she has been evaluated for consideration of SBRT to her pancreatic lesion with fiduciary was placed causing significant necrotizing pancreatitis.  Patient now has a developing pseudocyst could possibly be the etiology of her pain.  Patient's pain has significantly improved since admission.  She continues to have a poor appetite, but states this is improving.  She has no neurologic complaints.  She denies any fevers.  She denies any chest pain or shortness of breath.  She has no nausea, vomiting, constipation, or diarrhea.  She has no urinary complaints.  Patient offers no further specific complaints today.  REVIEW OF SYSTEMS:   Review of Systems  Constitutional: Positive for malaise/fatigue and weight loss. Negative for fever.  Respiratory: Negative.  Negative for cough and shortness of breath.   Cardiovascular: Negative.  Negative for chest pain and leg swelling.  Gastrointestinal: Positive for abdominal pain. Negative for blood in stool, melena, nausea and vomiting.  Genitourinary: Negative.  Negative for dysuria.  Musculoskeletal: Negative.  Negative for back pain.  Skin: Negative.  Negative for rash.  Neurological: Positive for weakness. Negative for dizziness, focal weakness and headaches.  Psychiatric/Behavioral: Negative.  The patient is not nervous/anxious.     As per HPI. Otherwise, a complete review of systems  is negative.  PAST MEDICAL HISTORY: Past Medical History:  Diagnosis Date  . Essential hypertension   . Pancreatic cancer ([place])   . UTI (urinary tract infection)     PAST SURGICAL HISTORY: Past Surgical History:  Procedure Laterality Date  . CESAREAN SECTION    . CHOLECYSTECTOMY    . EUS N/A 10/25/2017   Procedure: FULL UPPER ENDOSCOPIC ULTRASOUND (EUS) RADIAL;  Surgeon: Jola Schmidt, MD;  Location: [facility] ENDOSCOPY;  Service: Endoscopy;  Laterality: N/A;  . OOPHORECTOMY    . PORTA CATH INSERTION N/A 11/12/2017   Procedure: PORTA CATH INSERTION;  Surgeon: Algernon Huxley, MD;  Location: [facility];  Service: Cardiovascular;  Laterality: N/A;  . TUBAL LIGATION      FAMILY HISTORY: Family History  Problem Relation Age of Onset  . Hypertension Mother        deceased 71  . Heart attack Father        deceased late 14s    ADVANCED DIRECTIVES (Y/N):  @ADVDIR @  HEALTH MAINTENANCE: Social History   Tobacco Use  . Smoking status: Never Smoker  . Smokeless tobacco: Never Used  Substance Use Topics  . Alcohol use: Not Currently  . Drug use: Never     Colonoscopy:  PAP:  Bone density:  Lipid panel:  Allergies  Allergen Reactions  . Nsaids Other (See Comments)    Decreased GFR  . Ibuprofen     Lowers kidney function  . Penicillins     Yeast infection  Has patient had a PCN reaction causing immediate rash, facial/tongue/throat swelling, SOB or lightheadedness with hypotension: No Has patient had a PCN reaction causing severe rash involving mucus membranes or skin necrosis: No Has patient had a PCN reaction  that required hospitalization: No Has patient had a PCN reaction occurring within the last 10 years: No If all of the above answers are "NO", then may proceed with Cephalosporin use.     Current Facility-Administered Medications  Medication Dose Route Frequency Provider Last Rate Last Dose  . acetaminophen (TYLENOL) tablet 650 mg  650 mg Oral Q6H PRN Salary,  Montell D, MD       Or  . acetaminophen (TYLENOL) suppository 650 mg  650 mg Rectal Q6H PRN Salary, Montell D, MD      . albuterol (PROVENTIL) (2.5 MG/3ML) 0.083% nebulizer solution 2.5 mg  2.5 mg Nebulization Q4H PRN Salary, Montell D, MD      . apixaban (ELIQUIS) tablet 5 mg  5 mg Oral BID Demetrios Loll, MD      . bisacodyl (DULCOLAX) suppository 10 mg  10 mg Rectal Daily PRN Salary, Montell D, MD      . dextrose 5 %-0.45 % sodium chloride infusion   Intravenous Continuous Salary, Montell D, MD 75 mL/hr at 04/05/18 1513    . docusate sodium (COLACE) capsule 100 mg  100 mg Oral BID Salary, Montell D, MD   100 mg at 04/05/18 1054  . feeding supplement (BOOST / RESOURCE BREEZE) liquid 1 Container  1 Container Oral TID BM Demetrios Loll, MD   1 Container at 04/05/18 1513  . [START ON 04/07/2018] fentaNYL (DURAGESIC) 50 MCG/HR 1 patch  1 patch Transdermal Q72H Salary, Montell D, MD      . folic acid (FOLVITE) tablet 1 mg  1 mg Oral Daily Salary, Montell D, MD   1 mg at 04/05/18 1054  . HYDROmorphone (DILAUDID) injection 1 mg  1 mg Intravenous Q2H PRN Salary, Montell D, MD   1 mg at 04/05/18 1054  . HYDROmorphone (DILAUDID) tablet 2-4 mg  2-4 mg Oral Q4H PRN Borders, Kirt Boys, NP      . loperamide (IMODIUM) capsule 2 mg  2 mg Oral PRN Salary, Montell D, MD      . losartan (COZAAR) tablet 50 mg  50 mg Oral Daily Salary, Montell D, MD   50 mg at 04/05/18 1054  . MEDLINE mouth rinse  15 mL Mouth Rinse BID Salary, Montell D, MD   15 mL at 04/05/18 1054  . metoprolol tartrate (LOPRESSOR) injection 2.5 mg  2.5 mg Intravenous Q6H PRN Salary, Montell D, MD      . multivitamin with minerals tablet 1 tablet  1 tablet Oral Daily Demetrios Loll, MD   1 tablet at 04/05/18 1519  . naloxone (NARCAN) injection 0.4 mg  0.4 mg Intravenous PRN Salary, Montell D, MD      . ondansetron (ZOFRAN) tablet 4 mg  4 mg Oral Q6H PRN Salary, Montell D, MD       Or  . ondansetron (ZOFRAN) injection 4 mg  4 mg Intravenous Q6H PRN Salary,  Montell D, MD      . polyethylene glycol (MIRALAX / GLYCOLAX) packet 17 g  17 g Oral Daily PRN Salary, Montell D, MD      . prochlorperazine (COMPAZINE) tablet 10 mg  10 mg Oral Q8H PRN Salary, Montell D, MD      . sodium phosphate (FLEET) 7-19 GM/118ML enema 1 enema  1 enema Rectal Once PRN Salary, Montell D, MD      . temazepam (RESTORIL) capsule 15 mg  15 mg Oral QHS PRN Salary, Avel Peace, MD       Facility-Administered Medications Ordered in Other  Encounters  Medication Dose Route Frequency Provider Last Rate Last Dose  . prochlorperazine (COMPAZINE) injection 10 mg  10 mg Intravenous Once Rexene Agent        OBJECTIVE: Vitals:   04/05/18 0554 04/05/18 1544  BP: (!) 131/59 (!) 139/57  Pulse: (!) 104 (!) 102  Resp: 20 20  Temp: 98.7 F (37.1 C) 98.6 F (37 C)  SpO2: 96% 98%     Body mass index is 23.56 kg/m.    ECOG FS:3 - Symptomatic, >50% confined to bed  General: Ill-appearing, thin, no acute distress. Eyes: Pink conjunctiva, anicteric sclera. HEENT: Normocephalic, moist mucous membranes, clear oropharnyx. Lungs: Clear to auscultation bilaterally. Heart: Regular rate and rhythm. No rubs, murmurs, or gallops. Abdomen: Soft, nontender, nondistended. No organomegaly noted, normoactive bowel sounds. Musculoskeletal: No edema, cyanosis, or clubbing. Neuro: Alert, answering all questions appropriately. Cranial nerves grossly intact. Skin: No rashes or petechiae noted. Psych: Normal affect.  LAB RESULTS:  Lab Results  Component Value Date   NA 140 04/04/2018   K 3.9 04/04/2018   CL 108 04/04/2018   CO2 22 04/04/2018   GLUCOSE 164 (H) 04/04/2018   BUN 13 04/04/2018   CREATININE 1.28 (H) 04/04/2018   CALCIUM 8.7 (L) 04/04/2018   PROT 6.9 04/04/2018   ALBUMIN 2.6 (L) 04/04/2018   AST 27 04/04/2018   ALT 14 04/04/2018   ALKPHOS 78 04/04/2018   BILITOT 0.9 04/04/2018   GFRNONAA 43 (L) 04/04/2018   GFRAA 49 (L) 04/04/2018    Lab Results  Component Value Date   WBC  19.5 (H) 04/04/2018   NEUTROABS 15.3 (H) 04/04/2018   HGB 11.3 (L) 04/04/2018   HCT 35.3 (L) 04/04/2018   MCV 102.3 (H) 04/04/2018   PLT 318 04/04/2018     STUDIES: Ct Abdomen Pelvis W Contrast  Result Date: 04/04/2018 CLINICAL DATA:  Abdominal pain, history of pancreatic carcinoma and elevated white blood cell count EXAM: CT ABDOMEN AND PELVIS WITH CONTRAST TECHNIQUE: Multidetector CT imaging of the abdomen and pelvis was performed using the standard protocol following bolus administration of intravenous contrast. CONTRAST:  38mL OMNIPAQUE IOHEXOL 300 MG/ML  SOLN COMPARISON:  03/10/2018 FINDINGS: Lower chest: Left-sided pleural effusion is stable. Bibasilar mild atelectatic changes are again noted. Hepatobiliary: The liver is diffusely decreased in attenuation consistent with fatty infiltration. The gallbladder has been surgically removed. No focal mass lesion in the liver is seen. Pancreas: There is a focal hypodense mass lesion with peripheral enhancement identified within the uncinate process of the pancreas consistent with the patient's given clinical history of pancreatic carcinoma. Adjacent fiducial markers are seen. The lesion abuts the superior mesenteric artery consistent with neoplasm and vascular encasement. Occupying the majority of the body of the pancreas there is a an ovoid fluid collection which measures 9.8 x 5.0 cm. 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 Peripheral enhancement is noted. The tail of the pancreas appears within normal limits. A few smaller peripancreatic fluid collections are identified consistent with pseudocysts. The largest of these smaller fluid collections is seen on image number 17 of series 2 in the splenic hilum measuring 3.5 cm in greatest dimension. Spleen: Normal in size without focal abnormality. Adrenals/Urinary Tract: Adrenal glands are within normal limits. Kidneys are well visualized  bilaterally. An enhancing mass lesion arising from the left kidney is again noted and stable. This did not demonstrate hypermetabolic nature on prior PET-CT. It again measures approximately 3.1 cm in  dimension. No obstructive changes are seen. The bladder is decompressed. The known periurethral diverticulum on the right is less well appreciated due to the lack of contrast opacification. Stomach/Bowel: Diverticular change of the colon is noted without evidence of diverticulitis. The appendix is within normal limits. No obstructive change in the small bowel is identified. Vascular/Lymphatic: No significant vascular findings are present. No enlarged abdominal or pelvic lymph nodes. Reproductive: Uterus is within normal limits. No adnexal mass is seen. Other: Free fluid is noted within the pelvis as well as adjacent to the liver and spleen relatively stable from the previous exam. Fat containing periumbilical hernia is again seen and stable. Musculoskeletal: Degenerative changes of lumbar spine are noted. No definitive lytic or sclerotic lesions to suggest bony metastatic disease are seen. No compression deformities are noted. 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 Smaller pseudocysts are noted adjacent to the tail of the pancreas and spleen. Peripherally enhancing uncinate process mass consistent with the known history of pancreatic carcinoma. Stable ascites within the abdomen and pelvis. Stable left renal mass. Electronically Signed   By: Inez Catalina M.D.   On: 04/04/2018 23:20   Ct Abdomen Pelvis W Contrast  Result Date: 03/10/2018 CLINICAL DATA:  Inpatient. 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 Upper endoscopy with placement of radiation fiducial markers on 03/08/2018, now with nausea, vomiting and abdominal pain. EXAM: CT  ABDOMEN AND PELVIS WITH CONTRAST TECHNIQUE: Multidetector CT imaging of the abdomen and pelvis was performed using the standard protocol following bolus administration of intravenous contrast. CONTRAST:  85mL OMNIPAQUE IOHEXOL 300 MG/ML  SOLN COMPARISON:  03/08/2018 CT abdomen/pelvis. FINDINGS: Lower chest: Small dependent bilateral pleural effusions, left greater than right, new. Moderate bibasilar atelectasis. Tip of superior approach central venous catheter is seen near the cavoatrial junction. Hepatobiliary: Diffuse hepatic steatosis. No liver masses. Cholecystectomy. No biliary ductal dilatation. Pancreas: Hypoenhancing 2.3 x 2.1 cm uncinate process pancreatic mass (series 2/image 31), unchanged. Fiducial markers are again noted along right margin of the pancreatic mass. Diffuse thickening of the pancreas with prominent peripancreatic edema compatible with acute pancreatitis. There is non enhancement of the pancreatic parenchyma throughout the pancreatic body and much of the tail, compatible with necrotizing pancreatitis. No appreciable pancreatic duct dilation. Spleen: Normal size. No mass. Adrenals/Urinary Tract: Normal adrenals. Heterogeneously avidly enhancing exophytic 3.1 cm renal cortical mass in the lateral interpolar left kidney, unchanged. No hydronephrosis. Normal bladder. Right periurethral 2.1 x 1.2 cm diverticulum now containing excreted contrast (series 2/image 80), unchanged. Stomach/Bowel: Stomach is nondistended. There is fold thickening throughout fundus and body of the stomach, not definitely changed. Normal caliber small bowel with no small bowel wall thickening. Normal appendix. Moderate sigmoid diverticulosis, with no definite large bowel wall thickening. Vascular/Lymphatic: Normal caliber abdominal aorta. Patent hepatic and renal veins with no renal vein tumor thrombus. Stable at least moderate narrowing of portal splenic venous confluence by the mass. Reproductive: Grossly normal  uterus.  No adnexal mass. Other: No pneumoperitoneum. Stable moderate periumbilical fat containing hernia. Small to moderate volume ascites is increased. Retroperitoneal fluid in left greater than right anterior paranephric spaces is mildly decreased. New mild anasarca. No focal measurable fluid collections. Musculoskeletal: No aggressive appearing focal osseous lesions. Moderate thoracolumbar spondylosis. IMPRESSION: 1. Severe acute necrotizing pancreatitis with edema extending into the anterior paranephric retroperitoneal spaces bilaterally. Absence of pancreatic parenchymal enhancement throughout the pancreatic body and  much of the pancreatic tail. 2. Pancreatic neoplasm in the uncinate process is unchanged. 3. Worsening third-spacing of fluid with new small dependent bilateral pleural effusions, new mild anasarca and increased small to moderate volume ascites. 4. Fold thickening throughout stomach is nonspecific and probably reactive or due to third spacing of fluid. 5. Stable avidly enhancing exophytic 3.1 cm lateral interpolar left renal mass compatible with renal cell carcinoma. 6. No findings of metastatic disease in the abdomen or pelvis. 7. Diffuse hepatic steatosis. 8. Stable right urethral diverticulum. Electronically Signed   By: Ilona Sorrel M.D.   On: 03/10/2018 11:56   Ct Abdomen Pelvis W Contrast  Result Date: 03/08/2018 CLINICAL DATA:  Endoscopy today with increasing nausea and bilious vomiting. Patient is currently on radiation therapy for pancreatic cancer. EXAM: CT ABDOMEN AND PELVIS WITH CONTRAST TECHNIQUE: Multidetector CT imaging of the abdomen and pelvis was performed using the standard protocol following bolus administration of intravenous contrast. CONTRAST:  91mL OMNIPAQUE IOHEXOL 300 MG/ML  SOLN COMPARISON:  February 15, 2018 FINDINGS: Lower chest: There is mild atelectasis of the lung bases. The heart size is normal. Hepatobiliary: Diffuse low density of the liver without vessel  displacement is identified. No focal liver lesion is noted. Patient status post prior cholecystectomy. The intra and extrahepatic biliary ducts are normal. Pancreas: Surgical clips with associated hypodense mass is identified in the pancreatic head consistent with patient's known pancreatic cancer. Spleen: Normal in size without focal abnormality. Adrenals/Urinary Tract: The bilateral adrenal glands are normal. The right kidney is normal. There is a heterogeneous enhancing partial cystic mass in the lateral midpole left kidney measuring 2.8 x 3 cm unchanged compared prior exam. Fluid-filled bladder is normal. Stomach/Bowel: There is hiatal hernia. There is mild diffuse bowel wall thickening of the stomach with surrounding fluid and inflammatory change. There is no small bowel obstruction. There is diverticulosis of colon without diverticulitis. The appendix is not definitely seen. Vascular/Lymphatic: No significant vascular findings are present. No enlarged abdominal or pelvic lymph nodes. Reproductive: Uterus and bilateral adnexa are unremarkable. Other: Moderate ascites is identified in the abdomen and pelvis. There is midline umbilical herniation of mesenteric fat. There is no free air. Musculoskeletal: Degenerative joint changes of the spine are noted. IMPRESSION: Diffuse bowel wall thickening of the stomach. This is nonspecific. Finding can be due to infectious/inflammatory process. No free air. Mass in the pancreatic head consistent with patient's known pancreatic cancer. Moderate ascites in the abdomen and pelvis. Heterogeneous enhancing partial cystic mass in the lateral midpole left kidney unchanged suspicious for renal cell carcinoma. Electronically Signed   By: Abelardo Diesel M.D.   On: 03/08/2018 18:15   Dg Chest Port 1 View  Result Date: 03/10/2018 CLINICAL DATA:  68 year old with pneumonia. EXAM: PORTABLE CHEST 1 VIEW COMPARISON:  PET-CT 10/30/2017 FINDINGS: New patchy densities at the left lung  base and difficult to exclude small left pleural effusion. Few densities along the medial right lung base. Upper lungs are clear. Heart size is within normal limits. Right jugular Port-A-Cath is present. Catheter tip is near the SVC and right atrium junction. Negative for a pneumothorax. Surgical clips in the right upper abdomen. IMPRESSION: Bibasilar chest densities, left side greater than right. Findings concerning for infection, particularly on the left side. Can not exclude small left pleural effusion. Electronically Signed   By: Markus Daft M.D.   On: 03/10/2018 09:06   Dg Abd 2 Views  Result Date: 04/04/2018 CLINICAL DATA:  Mid abdominal pain. EXAM: ABDOMEN -  2 VIEW COMPARISON:  Body CT 03/10/2018 FINDINGS: The bowel gas pattern is normal. There is no evidence of free air. No radio-opaque calculi or other significant radiographic abnormality is seen. Postsurgical changes in the right upper quadrant and mid abdomen. IMPRESSION: Nonobstructive bowel gas pattern.  No free gas seen. Electronically Signed   By: Fidela Salisbury M.D.   On: 04/04/2018 15:52    ASSESSMENT: Pancreatic cancer, intractable pain.  PLAN:    1.  Stage Ib adenocarcinoma the pancreas: Patient last received chemotherapy with single agent gemcitabine on February 26, 2018.  More recently, she has been evaluated for consideration of SBRT to her pancreatic lesion with fiduciary placement causing significant necrotizing pancreatitis.  She is scheduled to start SBRT next Tuesday.  Patient has been instructed to keep this appointment as scheduled. 2.  Intractable pain: Significantly improved.  Case discussed with palliative care and agree with their recommendations for pain control. 3.  Renal insufficiency: Mild, monitor. 4.  Leukocytosis: Unclear etiology.  Patient does not appear to have any underlying infection.  Monitor. 5.  Pancreatic pseudocyst: Appreciate surgical input.  Continue conservative management as ordered. 6.   Disposition: Okay to discharge from oncology standpoint patient once has improved pain control and increased p.o. intake.  Recommended discharging with home health.  Appreciate consult, will follow.   Cancer Staging Pancreatic adenocarcinoma [facility]) Staging form: Exocrine Pancreas, AJCC 8th Edition - Clinical stage from 10/26/2017: Stage IB (cT2, cN0, cM0) - Signed by Lloyd Huger, MD on 10/26/2017   Lloyd Huger, MD   04/05/2018 8:25 PM

## 2018-04-05 NOTE — ED Provider Notes (Signed)
Spoke with internal medicine at [facility], they do not accept the patient in transfer because they noted that their wait list was extremely long and because the patient is not a primary [facility] patient.   Discussed with Dr. Anselm Jungling of hospital service who states he would admit the patient for pain control, doubt she is amenable for any sort of surgical drainage of pseudocyst given that appears to be relatively recent development   Lavonia Drafts, MD 04/05/18 0159

## 2018-04-05 NOTE — Progress Notes (Signed)
Family Meeting Note  Advance Directive:yes  Today a meeting took place with the Patient.  Patient is able to participate   The following clinical team members were present during this meeting:MD  The following were discussed:Patient's diagnosis: Pancreatic cancer, Patient's progosis: Unable to determine and Goals for treatment: Full Code  Additional follow-up to be provided: prn  Time spent during discussion:20 minutes  Gorden Harms, MD

## 2018-04-05 NOTE — Progress Notes (Addendum)
The patient has better abdominal pain.  No nausea vomiting or diarrhea.  She wants diet. Vital signs and lab reviewed.  Physical examination done. Continue current treatment.  Start clear liquid diet.  I discussed with the patient and RN. I called her son but nobody answered the phone.  Time spent about 25 minutes.

## 2018-04-05 NOTE — H&P (Signed)
[facility] NAME: Ronelle Michie    MR#:  [id number]  DATE OF BIRTH:  09/08/1950  DATE OF ADMISSION:  04/04/2018  PRIMARY CARE PHYSICIAN: Glendon Axe, MD   REQUESTING/REFERRING PHYSICIAN:   CHIEF COMPLAINT:   Chief Complaint  Patient presents with  . Abdominal Pain    HISTORY OF PRESENT ILLNESS: Emmily Pellegrin  is a 68 y.o. female with a known history of adenocarcinoma with pancreatic cancer diagnosed in August of last year, s/p chemo in December with plans for radiation for shrinking of tumor going forward, followed by Dr. Grayland Ormond, presenting to the emergency room with acute on chronic worsening abdominal pain, 10 out of 10 in terms of intensity, associated with nausea, ER work-up noted for white count 19,000, lipase 66, CT abdomen noted for pancreatic pseudocysts/uncinate process consistent with pancreatic cancer/stable ascites/left renal mass, patient evaluated in the emergency room, currently in no apparent distress, resting comfortably in bed, patient is now being admitted for acute on chronic abdominal pain most likely secondary to worsening pancreatic cancer, pancreatic pseudocyst, and pancreatitis.  PAST MEDICAL HISTORY:   Past Medical History:  Diagnosis Date  . Essential hypertension   . Pancreatic cancer ([place])   . UTI (urinary tract infection)     PAST SURGICAL HISTORY:  Past Surgical History:  Procedure Laterality Date  . CESAREAN SECTION    . CHOLECYSTECTOMY    . EUS N/A 10/25/2017   Procedure: FULL UPPER ENDOSCOPIC ULTRASOUND (EUS) RADIAL;  Surgeon: Jola Schmidt, MD;  Location: [facility] ENDOSCOPY;  Service: Endoscopy;  Laterality: N/A;  . OOPHORECTOMY    . PORTA CATH INSERTION N/A 11/12/2017   Procedure: PORTA CATH INSERTION;  Surgeon: Algernon Huxley, MD;  Location: [facility];  Service: Cardiovascular;  Laterality: N/A;  . TUBAL LIGATION      SOCIAL HISTORY:  Social History   Tobacco Use  . Smoking status:  Never Smoker  . Smokeless tobacco: Never Used  Substance Use Topics  . Alcohol use: Not Currently    FAMILY HISTORY:  Family History  Problem Relation Age of Onset  . Hypertension Mother        deceased 60  . Heart attack Father        deceased late 15s    DRUG ALLERGIES:  Allergies  Allergen Reactions  . Nsaids Other (See Comments)    Decreased GFR  . Ibuprofen     Lowers kidney function  . Penicillins     Yeast infection  Has patient had a PCN reaction causing immediate rash, facial/tongue/throat swelling, SOB or lightheadedness with hypotension: No Has patient had a PCN reaction causing severe rash involving mucus membranes or skin necrosis: No Has patient had a PCN reaction that required hospitalization: No Has patient had a PCN reaction occurring within the last 10 years: No If all of the above answers are "NO", then may proceed with Cephalosporin use.     REVIEW OF SYSTEMS:   CONSTITUTIONAL: No fever, +fatigue, weakness.  EYES: No blurred or double vision.  EARS, NOSE, AND THROAT: No tinnitus or ear pain.  RESPIRATORY: No cough, shortness of breath, wheezing or hemoptysis.  CARDIOVASCULAR: No chest pain, orthopnea, edema.  GASTROINTESTINAL: + nausea, abdominal pain.  GENITOURINARY: No dysuria, hematuria.  ENDOCRINE: No polyuria, nocturia,  HEMATOLOGY: No anemia, easy bruising or bleeding SKIN: No rash or lesion. MUSCULOSKELETAL: No joint pain or arthritis.   NEUROLOGIC: No tingling, numbness, weakness.  PSYCHIATRY: No anxiety or depression.  MEDICATIONS AT HOME:  Prior to Admission medications   Medication Sig Start Date End Date Taking? Authorizing Provider  apixaban (ELIQUIS) 5 MG TABS tablet Take 1 tablet (5 mg total) by mouth 2 (two) times daily. 03/19/18  Yes Lloyd Huger, MD  folic acid (FOLVITE) 1 MG tablet Take by mouth. 03/17/18 03/17/19 Yes [provider]  HYDROmorphone (DILAUDID) 2 MG tablet Take 1 tablet (2 mg total) by mouth  every 4 (four) hours as needed for severe pain. 04/04/18  Yes Jacquelin Hawking, NP  loperamide (IMODIUM) 2 MG capsule Take 2 mg by mouth as needed for diarrhea or loose stools.    Yes [provider]  losartan (COZAAR) 50 MG tablet Take 1 tablet (50 mg total) by mouth daily. 11/19/17  Yes Vaughan Basta, MD  NARCAN 4 MG/0.1ML LIQD nasal spray kit  03/21/18  Yes [provider]  ondansetron (ZOFRAN ODT) 4 MG disintegrating tablet Take 1 tablet (4 mg total) by mouth every 8 (eight) hours as needed for nausea or vomiting. 10/17/17  Yes Earleen Newport, MD  oxyCODONE (ROXICODONE) 15 MG immediate release tablet Take 1 - 2 tabs (15 - 30 mg) every 3 hours as needed for pain 04/02/18  Yes Burns, Wandra Feinstein, NP  prochlorperazine (COMPAZINE) 10 MG tablet Take by mouth.   Yes [provider]      PHYSICAL EXAMINATION:   VITAL SIGNS: Blood pressure (!) 123/53, pulse 99, temperature 98.1 F (36.7 C), temperature source Oral, resp. rate 16, height '5\' 3"'  (1.6 m), weight 60.3 kg, SpO2 94 %.  GENERAL:  68 y.o.-year-old patient lying in the bed with no acute distress.  Malnourished appearance EYES: Pupils equal, round, reactive to light and accommodation. No scleral icterus. Extraocular muscles intact.  HEENT: Head atraumatic, normocephalic. Oropharynx and nasopharynx clear.  NECK:  Supple, no jugular venous distention. No thyroid enlargement, no tenderness.  LUNGS: Normal breath sounds bilaterally, no wheezing, rales,rhonchi or crepitation. No use of accessory muscles of respiration.  CARDIOVASCULAR: S1, S2 normal. No murmurs, rubs, or gallops.  ABDOMEN: Soft, nontender, nondistended. Bowel sounds present. No organomegaly or mass.  EXTREMITIES: No pedal edema, cyanosis, or clubbing.  Diffuse muscular atrophy NEUROLOGIC: Cranial nerves II through XII are intact. MAES. Gait not checked.  PSYCHIATRIC: The patient is alert and oriented x 3.  SKIN: No obvious rash, lesion, or  ulcer.   LABORATORY PANEL:   CBC Recent Labs  Lab 04/02/18 0930 04/04/18 1410  WBC 8.9 19.5*  HGB 8.9* 11.3*  HCT 27.5* 35.3*  PLT 245 318  MCV 101.5* 102.3*  MCH 32.8 32.8  MCHC 32.4 32.0  RDW 17.0* 17.2*  LYMPHSABS 2.2 2.6  MONOABS 0.9 1.5*  EOSABS 0.1 0.0  BASOSABS 0.0 0.0   ------------------------------------------------------------------------------------------------------------------  Chemistries  Recent Labs  Lab 04/02/18 0930 04/04/18 1410  NA 140 140  K 3.0* 3.9  CL 107 108  CO2 23 22  GLUCOSE 115* 164*  BUN 10 13  CREATININE 1.10* 1.28*  CALCIUM 8.5* 8.7*  MG 1.7  --   AST 33 27  ALT 15 14  ALKPHOS 98 78  BILITOT 1.0 0.9   ------------------------------------------------------------------------------------------------------------------ estimated creatinine clearance is 34.3 mL/min (A) (by C-G formula based on SCr of 1.28 mg/dL (H)). ------------------------------------------------------------------------------------------------------------------ No results for input(s): TSH, T4TOTAL, T3FREE, THYROIDAB in the last 72 hours.  Invalid input(s): FREET3   Coagulation profile No results for input(s): INR, PROTIME in the last 168 hours. ------------------------------------------------------------------------------------------------------------------- No results for input(s): DDIMER in the  last 72 hours. -------------------------------------------------------------------------------------------------------------------  Cardiac Enzymes No results for input(s): CKMB, TROPONINI, MYOGLOBIN in the last 168 hours.  Invalid input(s): CK ------------------------------------------------------------------------------------------------------------------ Invalid input(s): POCBNP  ---------------------------------------------------------------------------------------------------------------  Urinalysis    Component Value Date/Time   COLORURINE YELLOW (A)  03/08/2018 1538   APPEARANCEUR CLEAR (A) 03/08/2018 1538   LABSPEC >1.046 (H) 03/08/2018 1538   PHURINE 5.0 03/08/2018 1538   GLUCOSEU NEGATIVE 03/08/2018 1538   HGBUR NEGATIVE 03/08/2018 1538   BILIRUBINUR NEGATIVE 03/08/2018 1538   KETONESUR 20 (A) 03/08/2018 1538   PROTEINUR 30 (A) 03/08/2018 1538   NITRITE NEGATIVE 03/08/2018 1538   LEUKOCYTESUR NEGATIVE 03/08/2018 1538     RADIOLOGY: Ct Abdomen Pelvis W Contrast  Result Date: 04/04/2018 CLINICAL DATA:  Abdominal pain, history of pancreatic carcinoma and elevated white blood cell count EXAM: CT ABDOMEN AND PELVIS WITH CONTRAST TECHNIQUE: Multidetector CT imaging of the abdomen and pelvis was performed using the standard protocol following bolus administration of intravenous contrast. CONTRAST:  64m OMNIPAQUE IOHEXOL 300 MG/ML  SOLN COMPARISON:  03/10/2018 FINDINGS: Lower chest: Left-sided pleural effusion is stable. Bibasilar mild atelectatic changes are again noted. Hepatobiliary: The liver is diffusely decreased in attenuation consistent with fatty infiltration. The gallbladder has been surgically removed. No focal mass lesion in the liver is seen. Pancreas: There is a focal hypodense mass lesion with peripheral enhancement identified within the uncinate process of the pancreas consistent with the patient's given clinical history of pancreatic carcinoma. Adjacent fiducial markers are seen. The lesion abuts the superior mesenteric artery consistent with neoplasm and vascular encasement. Occupying the majority of the body of the pancreas there is a an ovoid fluid collection which measures 9.8 x 5.0 cm. 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 Peripheral enhancement is noted. The tail of the pancreas appears within normal limits. A few smaller peripancreatic fluid collections are identified consistent with pseudocysts. The largest of these smaller fluid collections is seen on  image number 17 of series 2 in the splenic hilum measuring 3.5 cm in greatest dimension. Spleen: Normal in size without focal abnormality. Adrenals/Urinary Tract: Adrenal glands are within normal limits. Kidneys are well visualized bilaterally. An enhancing mass lesion arising from the left kidney is again noted and stable. This did not demonstrate hypermetabolic nature on prior PET-CT. It again measures approximately 3.1 cm in dimension. No obstructive changes are seen. The bladder is decompressed. The known periurethral diverticulum on the right is less well appreciated due to the lack of contrast opacification. Stomach/Bowel: Diverticular change of the colon is noted without evidence of diverticulitis. The appendix is within normal limits. No obstructive change in the small bowel is identified. Vascular/Lymphatic: No significant vascular findings are present. No enlarged abdominal or pelvic lymph nodes. Reproductive: Uterus is within normal limits. No adnexal mass is seen. Other: Free fluid is noted within the pelvis as well as adjacent to the liver and spleen relatively stable from the previous exam. Fat containing periumbilical hernia is again seen and stable. Musculoskeletal: Degenerative changes of lumbar spine are noted. No definitive lytic or sclerotic lesions to suggest bony metastatic disease are seen. No compression deformities are noted. 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 Smaller pseudocysts are noted adjacent to the tail of the pancreas and spleen. Peripherally enhancing uncinate process mass consistent with the known history of pancreatic carcinoma. Stable ascites within the  abdomen and pelvis. Stable left renal mass. Electronically Signed   By: Inez Catalina M.D.   On: 04/04/2018 23:20   Dg Abd 2 Views  Result Date: 04/04/2018 CLINICAL DATA:  Mid  abdominal pain. EXAM: ABDOMEN - 2 VIEW COMPARISON:  Body CT 03/10/2018 FINDINGS: The bowel gas pattern is normal. There is no evidence of free air. No radio-opaque calculi or other significant radiographic abnormality is seen. Postsurgical changes in the right upper quadrant and mid abdomen. IMPRESSION: Nonobstructive bowel gas pattern.  No free gas seen. Electronically Signed   By: Fidela Salisbury M.D.   On: 04/04/2018 15:52    EKG: No orders found for this or any previous visit.  IMPRESSION AND PLAN: *Acute on chronic abdominal pain Suspected due to worsening pancreatic cancer, pancreatic pseudocyst, and pancreatitis Admit to regular nursing for bed, IV fluids for rehydration, adult pain protocol, consult oncology for continuity of care, general surgery for expert opinion- ?  Need for drainage/ID, n.p.o. for now, hold Eliquis  *Acute on chronic adenocarcinoma of the pancreas S/P chemo in December with plans for radiation treatments to help shrink cancer with hopes of surgical resection  Followed by Dr. Grayland Ormond Oncology consulted for continuity of care  *Acute pancreatitis with pancreatic pseudocyst Most likely secondary to pancreatic cancer Plan of care as stated above  *Chronic hypertension Hydralazine as needed systolic blood pressure greater than 160, continue losartan, vitals per routine, make changes as per necessary    All the records are reviewed and case discussed with ED provider. Management plans discussed with the patient, family and they are in agreement.  CODE STATUS:full Code Status History    Date Active Date Inactive Code Status Order ID Comments User Context   03/08/2018 2139 03/11/2018 0101 Full Code [id number]  Demetrios Loll, MD Inpatient   11/16/2017 1129 11/18/2017 1756 Full Code [id number]  Saundra Shelling, MD ED   10/18/2017 1500 10/19/2017 1830 Full Code [id number]  Henreitta Leber, MD ED    Advance Directive Documentation     Most Recent Value  Type of Advance  Directive  Healthcare Power of Attorney  Pre-existing out of facility DNR order (yellow form or pink MOST form)  -  "MOST" Form in Place?  -       TOTAL TIME TAKING CARE OF THIS PATIENT: 40 minutes.    Avel Peace Yelitza Reach M.D on 04/05/2018   Between 7am to 6pm - Pager - [phone]  After 6pm go to [email or web address] - password [email or web address] [facility]  Office  [phone]  CC: Primary care physician; Glendon Axe, MD   Note: This dictation was prepared with Dragon dictation along with smaller phrase technology. Any transcriptional errors that result from this process are unintentional.

## 2018-04-05 NOTE — Progress Notes (Signed)
   04/05/18 0700  Clinical Encounter Type  Visited With Patient  Visit Type Initial;Spiritual support  Referral From Nurse  Spiritual Encounters  Spiritual Needs Prayer

## 2018-04-05 NOTE — Plan of Care (Signed)

## 2018-04-05 NOTE — ED Notes (Signed)
Admitting team at bedside.

## 2018-04-05 NOTE — Progress Notes (Signed)
Initial Nutrition Assessment  DOCUMENTATION CODES:   Non-severe (moderate) malnutrition in context of chronic illness  INTERVENTION:  -Boost Breeze po TID, each supplement provides 250 kcal and 9 grams of protein (patient willing to try any flavor) -MVI  -At diet advancement recommend Ensure Enlive po BID, each supplement provides 350 kcal and 20 grams of protein Magic cup TID with meals, each supplement provides 290 kcal and 9 grams of protein   NUTRITION DIAGNOSIS:   Moderate Malnutrition related to decreased appetite, cancer and cancer related treatments as evidenced by moderate muscle depletion, moderate fat depletion, energy intake < or equal to 50% for > or equal to 1 month, percent weight loss.   GOAL:   Patient will meet greater than or equal to 90% of their needs   MONITOR:   PO intake, Weight trends, Supplement acceptance, Diet advancement, Labs  REASON FOR ASSESSMENT:   Consult Assessment of nutrition requirement/status  ASSESSMENT:  68 year old female recently diagnosed with pancreatic cancer in August 2019. Pt completed chemo and recent US placement of radiation guides. Pt presented to ED with severe abdominal pain and found to have large pseudocyst.    Patient followed by [place]. 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 Patient was scheduled for follow up CT and began to experience severe abdominal pain over the past 2 days.  Per surgery note, no evidence of further necrosis; large pseudocyst likely as the result of previous necrotizing pancreatitis. MD recommendation is to transfer patient back to [facility], beds unavailable at this time.   Patient resting with son asleep in bedside chair. Patient was NPO at visit and reports improvements to abdominal pain, stating that she had just received some pain medication.  Patient reports decreased appetite and poor PO over the past few months eating 2 meals/day. Patients  son began shaking his head no and stated that she might have 1 small meal/day and drinks 1 CIB or Boost daily. RD encouraged trying small meals throughout the day, adding butter when cooking, gravy's and sauces to meat, encorporating Boost Plus to increase protein and caloric content of ONS..  Patient is currently not prescribed pancreatic enzymes and she may benefit from taking them prior to meals/snacks. Discussed with MD during visit.   Patient recalls UBW 160-170 lbs approximately 1 year ago. Current weight is noted 60.3kg (133 lb) Patient has experienced significant wt loss  ~ 32lbs over the past year.   Medications reviewed and include: fentanyl, folic acid, dilaudid  D5 @ 75 ml/hr provides 306 kcal  Labs: Glucose 164 (H) Amylase, Serum 143 (H) Lipase 66 (H)  NUTRITION - FOCUSED PHYSICAL EXAM:    Most Recent Value  Orbital Region  Moderate depletion  Upper Arm Region  Moderate depletion  Thoracic and Lumbar Region  Mild depletion  Buccal Region  Moderate depletion  Temple Region  Moderate depletion  Clavicle Bone Region  Moderate depletion  Clavicle and Acromion Bone Region  Mild depletion  Scapular Bone Region  Mild depletion  Dorsal Hand  Moderate depletion  Patellar Region  Moderate depletion  Anterior Thigh Region  Mild depletion  Posterior Calf Region  Moderate depletion  Edema (RD Assessment)  None  Hair  Unable to assess [pt wearing head wrap]  Eyes  Reviewed  Mouth  Reviewed  Skin  Reviewed  Nails  Reviewed       Diet Order:   Diet Order  Diet clear liquid Room service appropriate? Yes; Fluid consistency: Thin  Diet effective now              EDUCATION NEEDS:   Education needs have been addressed  Skin:  Skin Assessment: Reviewed RN Assessment  Last BM:  2/12  Height:   Ht Readings from Last 1 Encounters:  04/04/18 5\' 3"  (1.6 m)    Weight:   Wt Readings from Last 1 Encounters:  04/04/18 60.3 kg   04/04/18 60.3 kg  03/26/18  60 kg  03/10/18 67.3 kg  02/27/18 65.5 kg  02/19/18 66.4 kg  01/30/18 67.9 kg  01/23/18 70.3 kg     Ideal Body Weight:  52.3 kg  BMI:  Body mass index is 23.56 kg/m.  Estimated Nutritional Needs:   Kcal:  1810-2110 (30-35kcal/kg)  Protein:  90-106 grams  Fluid:  2L/day    Lajuan Lines, RD, LDN  After Hours/Weekend Pager: [phone]

## 2018-04-05 NOTE — Consult Note (Signed)
Patient ID: Cindy Bowman, female   DOB: 1950-04-09, 68 y.o.   MRN: [id number]  HPI Cindy Bowman is a 68 y.o. female asked to see In consultation by Dr. Grayland Ormond and Dr. Jerelyn Charles. Case d/w them in detail. She was recently diagnosed with pancreatic cancer diagnosed in August 2019 did receive chemotherapy and now planning for radiation therapy.  Recently underwent endoscopic ultrasound for placement of radiation guides.  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  She was being followed by them and had a pending CT.  More recently over the last 2 days now comes with moderate to severe pain in the mid abdomen that radiates to the back.  Worsening when she eats.  No fevers no chills.   A CT scan was performed that I have personally reviewed and there is a large pseudocyst likely as the results of the previous necrotizing pancreatitis.  There is no evidence of further necrosis.   Her white count was 19,000, creatinine 1.28 lipase of 66.  Amylase of 143  HPI  Past Medical History:  Diagnosis Date  . Essential hypertension   . Pancreatic cancer ([facility])   . UTI (urinary tract infection)     Past Surgical History:  Procedure Laterality Date  . CESAREAN SECTION    . CHOLECYSTECTOMY    . EUS N/A 10/25/2017   Procedure: FULL UPPER ENDOSCOPIC ULTRASOUND (EUS) RADIAL;  Surgeon: Jola Schmidt, MD;  Location: [facility] ENDOSCOPY;  Service: Endoscopy;  Laterality: N/A;  . OOPHORECTOMY    . PORTA CATH INSERTION N/A 11/12/2017   Procedure: PORTA CATH INSERTION;  Surgeon: Algernon Huxley, MD;  Location: [facility];  Service: Cardiovascular;  Laterality: N/A;  . TUBAL LIGATION      Family History  Problem Relation Age of Onset  . Hypertension Mother        deceased 22  . Heart attack Father        deceased late 90s    Social History Social History   Tobacco Use  . Smoking status: Never Smoker  . Smokeless tobacco: Never Used  Substance Use Topics  . Alcohol use: Not  Currently  . Drug use: Never    Allergies  Allergen Reactions  . Nsaids Other (See Comments)    Decreased GFR  . Ibuprofen     Lowers kidney function  . Penicillins     Yeast infection  Has patient had a PCN reaction causing immediate rash, facial/tongue/throat swelling, SOB or lightheadedness with hypotension: No Has patient had a PCN reaction causing severe rash involving mucus membranes or skin necrosis: No Has patient had a PCN reaction that required hospitalization: No Has patient had a PCN reaction occurring within the last 10 years: No If all of the above answers are "NO", then may proceed with Cephalosporin use.     Current Facility-Administered Medications  Medication Dose Route Frequency Provider Last Rate Last Dose  . acetaminophen (TYLENOL) tablet 650 mg  650 mg Oral Q6H PRN Salary, Montell D, MD       Or  . acetaminophen (TYLENOL) suppository 650 mg  650 mg Rectal Q6H PRN Salary, Montell D, MD      . albuterol (PROVENTIL) (2.5 MG/3ML) 0.083% nebulizer solution 2.5 mg  2.5 mg Nebulization Q4H PRN Salary, Montell D, MD      . bisacodyl (DULCOLAX) suppository 10 mg  10 mg Rectal Daily PRN Salary, Montell D, MD      . dextrose 5 %-0.45 % sodium chloride  infusion   Intravenous Continuous Salary, Montell D, MD 75 mL/hr at 04/05/18 0600    . docusate sodium (COLACE) capsule 100 mg  100 mg Oral BID Salary, Montell D, MD      . enoxaparin (LOVENOX) injection 40 mg  40 mg Subcutaneous Q24H Salary, Montell D, MD      . Derrill Memo ON 04/07/2018] fentaNYL (DURAGESIC) 50 MCG/HR 1 patch  1 patch Transdermal Q72H Salary, Montell D, MD      . folic acid (FOLVITE) tablet 1 mg  1 mg Oral Daily Salary, Montell D, MD      . HYDROmorphone (DILAUDID) injection 1 mg  1 mg Intravenous Q2H PRN Salary, Montell D, MD   1 mg at 04/05/18 0328  . loperamide (IMODIUM) capsule 2 mg  2 mg Oral PRN Salary, Montell D, MD      . losartan (COZAAR) tablet 50 mg  50 mg Oral Daily Salary, Montell D, MD      .  MEDLINE mouth rinse  15 mL Mouth Rinse BID Salary, Montell D, MD      . metoprolol tartrate (LOPRESSOR) injection 2.5 mg  2.5 mg Intravenous Q6H PRN Salary, Montell D, MD      . naloxone (NARCAN) injection 0.4 mg  0.4 mg Intravenous PRN Salary, Montell D, MD      . ondansetron (ZOFRAN) tablet 4 mg  4 mg Oral Q6H PRN Salary, Montell D, MD       Or  . ondansetron (ZOFRAN) injection 4 mg  4 mg Intravenous Q6H PRN Salary, Montell D, MD      . oxyCODONE (Oxy IR/ROXICODONE) immediate release tablet 15 mg  15 mg Oral Q6H PRN Salary, Montell D, MD      . polyethylene glycol (MIRALAX / GLYCOLAX) packet 17 g  17 g Oral Daily PRN Salary, Montell D, MD      . prochlorperazine (COMPAZINE) tablet 10 mg  10 mg Oral Q8H PRN Salary, Montell D, MD      . sodium phosphate (FLEET) 7-19 GM/118ML enema 1 enema  1 enema Rectal Once PRN Salary, Montell D, MD      . temazepam (RESTORIL) capsule 15 mg  15 mg Oral QHS PRN Salary, Avel Peace, MD       Facility-Administered Medications Ordered in Other Encounters  Medication Dose Route Frequency Provider Last Rate Last Dose  . prochlorperazine (COMPAZINE) injection 10 mg  10 mg Intravenous Once Rexene Agent         Review of Systems Full ROS  was asked and was negative except for the information on the HPI  Physical Exam Blood pressure (!) 131/59, pulse (!) 104, temperature 98.7 F (37.1 C), temperature source Oral, resp. rate 20, height 5\' 3"  (1.6 m), weight 60.3 kg, SpO2 96 %. CONSTITUTIONAL: NAD EYES: Pupils are equal, round, and reactive to light, Sclera are non-icteric. EARS, NOSE, MOUTH AND THROAT: The oropharynx is clear. The oral mucosa is pink and moist. Hearing is intact to voice. LYMPH NODES:  Lymph nodes in the neck are normal. RESPIRATORY:  Lungs are clear. There is normal respiratory effort, with equal breath sounds bilaterally, and without pathologic use of accessory muscles. CARDIOVASCULAR: Heart is regular without murmurs, gallops, or rubs. GI: The  abdomen is  soft, mild diffuse tenderness, no peritonitis. There are no palpable masses. There is no hepatosplenomegaly. There are normal bowel sounds in all quadrants. GU: Rectal deferred.   MUSCULOSKELETAL: Normal muscle strength and tone. No cyanosis or edema.  SKIN: Turgor is good and there are no pathologic skin lesions or ulcers. NEUROLOGIC: Motor and sensation is grossly normal. Cranial nerves are grossly intact. PSYCH:  Oriented to person, place and time. Affect is normal.  Data Reviewed   I have personally reviewed the patient's imaging, laboratory findings and medical records.    Assessment/Plan 68 year old female with a recent diagnosis adenocarcinoma of the pancreas status post EUS procedure for guidance of radiation therapy complicated by recent necrotizing pancreatitis being follow-up at [facility].  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  No evidence of necrosis at this time. My Recommendation would be to transfer the patient back  To [facility] since they have done the recent procedure that was complicated by necrotizing pancreatitis.  Currently pseudocyst likely result from pancreatitis. No surgical intervention required.  We typically wait at least 6 weeks to 8 weeks until the wall of the cyst is mature for potential intervention.  Intervention will be indicated only if the cyst does not improve in size and if symptoms remain.  Typically a cyst gastrostomy by GI will be the first line therapy after conservative management had failed.  Surgical cyst is an ostomy it is another alternative but only reserved as last resource. I have d/w the pt and Dr. Grayland Ormond the situation in detail. Apparently no beds available at  this time at [facility].     Caroleen Hamman, MD FACS General Surgeon 04/05/2018, 9:42 AM

## 2018-04-05 NOTE — Consult Note (Signed)
[place]  [phone] [phone]   Name: Cindy Bowman Date: 04/05/2018 MRN: [id number]  DOB: 09/05/1950  Patient Care Team: Glendon Axe, MD as PCP - General (Internal Medicine) Clent Jacks, RN as Registered Nurse Grayland Ormond Kathlene November, MD as Medical Oncologist (Medical Oncology)    REASON FOR CONSULTATION: Palliative Care consult requested for this 68 y.o. female with multiple medical problems including stage Ib adenocarcinoma of the pancreas (diagnosed 09/2017) currently receiving neoadjuvant FOLFIRINOX plan for stereotactic XRT prior to resection.  Patient recently developed necrotizing pancreatitis after EUS procedure at [facility] which was subsequently managed conservatively. She has continued to have severe abdominal pain and presented to the [facility] at the [facility] on . CT scan revealed large pancreatic pseudocyst without evidence of further necrosis. She has been evaluated by surgery, who recommended patient to be transferred back to [facility]. However, there apparently were no available beds and surgery was not felt to be required. Patient has been referred to palliative care to help with symptom management and address treatment goals.  SOCIAL HISTORY:    Patient is divorced.  She has one son with whom she lives.  Patient moved here from [place].  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  ADVANCE DIRECTIVES:  Patient says her son is her healthcare power of attorney.  Patient says she has a living will. She has a MOST form from 11/30/2017 specifying DNR/DNI.  CODE STATUS: Full code  PAST MEDICAL HISTORY: Past Medical History:  Diagnosis Date  . Essential hypertension   . Pancreatic cancer ([place])   . UTI (urinary tract infection)     PAST SURGICAL HISTORY:  Past Surgical History:  Procedure Laterality Date  . CESAREAN SECTION    . CHOLECYSTECTOMY    . EUS N/A 10/25/2017   Procedure: FULL UPPER ENDOSCOPIC ULTRASOUND  (EUS) RADIAL;  Surgeon: Jola Schmidt, MD;  Location: [facility] ENDOSCOPY;  Service: Endoscopy;  Laterality: N/A;  . OOPHORECTOMY    . PORTA CATH INSERTION N/A 11/12/2017   Procedure: PORTA CATH INSERTION;  Surgeon: Algernon Huxley, MD;  Location: [place] CV LAB;  Service: Cardiovascular;  Laterality: N/A;  . TUBAL LIGATION      HEMATOLOGY/ONCOLOGY HISTORY:    Pancreatic adenocarcinoma ([place])   10/18/2017 Initial Diagnosis    Pancreatic adenocarcinoma ([place])    10/26/2017 Cancer Staging    Staging form: Exocrine Pancreas, AJCC 8th Edition - Clinical stage from 10/26/2017: Stage IB (cT2, cN0, cM0) - Signed by Lloyd Huger, MD on 10/26/2017    11/06/2017 - 12/11/2017 Chemotherapy    The patient had palonosetron (ALOXI) injection 0.25 mg, 0.25 mg, Intravenous,  Once, 2 of 4 cycles Administration: 0.25 mg (11/14/2017), 0.25 mg (11/28/2017) irinotecan (CAMPTOSAR) 340 mg in dextrose 5 % 500 mL chemo infusion, 180 mg/m2 = 340 mg, Intravenous,  Once, 2 of 4 cycles Dose modification: 160 mg/m2 (original dose 180 mg/m2, Cycle 2, Reason: Dose not tolerated) Administration: 340 mg (11/14/2017), 300 mg (11/28/2017) leucovorin 750 mg in dextrose 5 % 250 mL infusion, 760 mg, Intravenous,  Once, 2 of 4 cycles Dose modification: 360 mg/m2 (original dose 400 mg/m2, Cycle 2, Reason: Dose not tolerated) Administration: 750 mg (11/14/2017), 700 mg (11/28/2017) oxaliplatin (ELOXATIN) 150 mg in dextrose 5 % 500 mL chemo infusion, 160 mg, Intravenous,  Once, 2 of 4 cycles Dose modification: 70 mg/m2 (original dose 85 mg/m2, Cycle 2, Reason: Dose not tolerated) Administration: 150 mg (11/14/2017), 135 mg (11/28/2017) fluorouracil (ADRUCIL) chemo injection  750 mg, 400 mg/m2 = 750 mg, Intravenous,  Once, 2 of 4 cycles Dose modification: 360 mg/m2 (original dose 400 mg/m2, Cycle 2, Reason: Dose not tolerated) Administration: 750 mg (11/14/2017), 700 mg (11/28/2017) fluorouracil (ADRUCIL) 4,550 mg in sodium chloride 0.9 % 59 mL  chemo infusion, 2,400 mg/m2 = 4,550 mg, Intravenous, 1 Day/Dose, 2 of 4 cycles Dose modification: 2,000 mg/m2 (original dose 2,400 mg/m2, Cycle 2, Reason: Dose not tolerated) Administration: 4,550 mg (11/14/2017), 3,800 mg (11/28/2017)  for chemotherapy treatment.     12/26/2017 -  Chemotherapy    The patient had gemcitabine (GEMZAR) 1,800 mg in sodium chloride 0.9 % 250 mL chemo infusion, 1,786 mg, Intravenous,  Once, 3 of 4 cycles Administration: 1,800 mg (12/26/2017), 1,800 mg (01/02/2018), 1,800 mg (01/09/2018), 1,800 mg (01/23/2018), 1,800 mg (01/30/2018)  for chemotherapy treatment.      ALLERGIES:  is allergic to nsaids; ibuprofen; and penicillins.  MEDICATIONS:  Current Facility-Administered Medications  Medication Dose Route Frequency Provider Last Rate Last Dose  . acetaminophen (TYLENOL) tablet 650 mg  650 mg Oral Q6H PRN Salary, Montell D, MD       Or  . acetaminophen (TYLENOL) suppository 650 mg  650 mg Rectal Q6H PRN Salary, Montell D, MD      . albuterol (PROVENTIL) (2.5 MG/3ML) 0.083% nebulizer solution 2.5 mg  2.5 mg Nebulization Q4H PRN Salary, Montell D, MD      . apixaban (ELIQUIS) tablet 5 mg  5 mg Oral BID Demetrios Loll, MD      . bisacodyl (DULCOLAX) suppository 10 mg  10 mg Rectal Daily PRN Salary, Montell D, MD      . dextrose 5 %-0.45 % sodium chloride infusion   Intravenous Continuous Salary, Montell D, MD 75 mL/hr at 04/05/18 1513    . docusate sodium (COLACE) capsule 100 mg  100 mg Oral BID Salary, Montell D, MD   100 mg at 04/05/18 1054  . feeding supplement (BOOST / RESOURCE BREEZE) liquid 1 Container  1 Container Oral TID BM Demetrios Loll, MD   1 Container at 04/05/18 1513  . [START ON 04/07/2018] fentaNYL (DURAGESIC) 50 MCG/HR 1 patch  1 patch Transdermal Q72H Salary, Montell D, MD      . folic acid (FOLVITE) tablet 1 mg  1 mg Oral Daily Salary, Montell D, MD   1 mg at 04/05/18 1054  . HYDROmorphone (DILAUDID) injection 1 mg  1 mg Intravenous Q2H PRN Salary, Montell  D, MD   1 mg at 04/05/18 1054  . loperamide (IMODIUM) capsule 2 mg  2 mg Oral PRN Salary, Montell D, MD      . losartan (COZAAR) tablet 50 mg  50 mg Oral Daily Salary, Montell D, MD   50 mg at 04/05/18 1054  . MEDLINE mouth rinse  15 mL Mouth Rinse BID Salary, Montell D, MD   15 mL at 04/05/18 1054  . metoprolol tartrate (LOPRESSOR) injection 2.5 mg  2.5 mg Intravenous Q6H PRN Salary, Montell D, MD      . multivitamin with minerals tablet 1 tablet  1 tablet Oral Daily Demetrios Loll, MD   1 tablet at 04/05/18 1519  . naloxone [facility]) injection 0.4 mg  0.4 mg Intravenous PRN Salary, Montell D, MD      . ondansetron (ZOFRAN) tablet 4 mg  4 mg Oral Q6H PRN Salary, Montell D, MD       Or  . ondansetron (ZOFRAN) injection 4 mg  4 mg Intravenous Q6H PRN Salary, Montell D,  MD      . oxyCODONE (Oxy IR/ROXICODONE) immediate release tablet 15 mg  15 mg Oral Q6H PRN Salary, Montell D, MD      . polyethylene glycol (MIRALAX / GLYCOLAX) packet 17 g  17 g Oral Daily PRN Salary, Montell D, MD      . prochlorperazine (COMPAZINE) tablet 10 mg  10 mg Oral Q8H PRN Salary, Montell D, MD      . sodium phosphate (FLEET) 7-19 GM/118ML enema 1 enema  1 enema Rectal Once PRN Salary, Montell D, MD      . temazepam (RESTORIL) capsule 15 mg  15 mg Oral QHS PRN Salary, Avel Peace, MD       Facility-Administered Medications Ordered in Other Encounters  Medication Dose Route Frequency Provider Last Rate Last Dose  . prochlorperazine (COMPAZINE) injection 10 mg  10 mg Intravenous Once Rexene Agent        VITAL SIGNS: BP (!) 139/57 (BP Location: Right Arm)   Pulse (!) 102   Temp 98.6 F (37 C) (Oral)   Resp 20   Ht '5\' 3"'$  (1.6 m)   Wt 133 lb (60.3 kg)   SpO2 98%   BMI 23.56 kg/m  Filed Weights   04/04/18 1658  Weight: 133 lb (60.3 kg)    Estimated body mass index is 23.56 kg/m as calculated from the following:   Height as of this encounter: '5\' 3"'$  (1.6 m).   Weight as of this encounter: 133 lb (60.3  kg).  LABS: CBC:    Component Value Date/Time   WBC 19.5 (H) 04/04/2018 1410   HGB 11.3 (L) 04/04/2018 1410   HCT 35.3 (L) 04/04/2018 1410   PLT 318 04/04/2018 1410   MCV 102.3 (H) 04/04/2018 1410   NEUTROABS 15.3 (H) 04/04/2018 1410   LYMPHSABS 2.6 04/04/2018 1410   MONOABS 1.5 (H) 04/04/2018 1410   EOSABS 0.0 04/04/2018 1410   BASOSABS 0.0 04/04/2018 1410   Comprehensive Metabolic Panel:    Component Value Date/Time   NA 140 04/04/2018 1410   K 3.9 04/04/2018 1410   CL 108 04/04/2018 1410   CO2 22 04/04/2018 1410   BUN 13 04/04/2018 1410   CREATININE 1.28 (H) 04/04/2018 1410   GLUCOSE 164 (H) 04/04/2018 1410   CALCIUM 8.7 (L) 04/04/2018 1410   AST 27 04/04/2018 1410   ALT 14 04/04/2018 1410   ALKPHOS 78 04/04/2018 1410   BILITOT 0.9 04/04/2018 1410   PROT 6.9 04/04/2018 1410   ALBUMIN 2.6 (L) 04/04/2018 1410    RADIOGRAPHIC STUDIES: Ct Abdomen Pelvis W Contrast  Result Date: 04/04/2018 CLINICAL DATA:  Abdominal pain, history of pancreatic carcinoma and elevated white blood cell count EXAM: CT ABDOMEN AND PELVIS WITH CONTRAST TECHNIQUE: Multidetector CT imaging of the abdomen and pelvis was performed using the standard protocol following bolus administration of intravenous contrast. CONTRAST:  77m OMNIPAQUE IOHEXOL 300 MG/ML  SOLN COMPARISON:  03/10/2018 FINDINGS: Lower chest: Left-sided pleural effusion is stable. Bibasilar mild atelectatic changes are again noted. Hepatobiliary: The liver is diffusely decreased in attenuation consistent with fatty infiltration. The gallbladder has been surgically removed. No focal mass lesion in the liver is seen. Pancreas: There is a focal hypodense mass lesion with peripheral enhancement identified within the uncinate process of the pancreas consistent with the patient's given clinical history of pancreatic carcinoma. Adjacent fiducial markers are seen. The lesion abuts the superior mesenteric artery consistent with neoplasm and vascular  encasement. Occupying the majority of the body of the  pancreas there is a an ovoid fluid collection which measures 9.8 x 5.0 cm. 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 Peripheral enhancement is noted. The tail of the pancreas appears within normal limits. A few smaller peripancreatic fluid collections are identified consistent with pseudocysts. The largest of these smaller fluid collections is seen on image number 17 of series 2 in the splenic hilum measuring 3.5 cm in greatest dimension. Spleen: Normal in size without focal abnormality. Adrenals/Urinary Tract: Adrenal glands are within normal limits. Kidneys are well visualized bilaterally. An enhancing mass lesion arising from the left kidney is again noted and stable. This did not demonstrate hypermetabolic nature on prior PET-CT. It again measures approximately 3.1 cm in dimension. No obstructive changes are seen. The bladder is decompressed. The known periurethral diverticulum on the right is less well appreciated due to the lack of contrast opacification. Stomach/Bowel: Diverticular change of the colon is noted without evidence of diverticulitis. The appendix is within normal limits. No obstructive change in the small bowel is identified. Vascular/Lymphatic: No significant vascular findings are present. No enlarged abdominal or pelvic lymph nodes. Reproductive: Uterus is within normal limits. No adnexal mass is seen. Other: Free fluid is noted within the pelvis as well as adjacent to the liver and spleen relatively stable from the previous exam. Fat containing periumbilical hernia is again seen and stable. Musculoskeletal: Degenerative changes of lumbar spine are noted. No definitive lytic or sclerotic lesions to suggest bony metastatic disease are seen. No compression deformities are noted. IMPRESSION: Previously seen area of acute necrotizing pancreatitis has evolved into a large  pancreatic pseudocyst occupying the majority of the pancreatic body as described above. Smaller pseudocysts are noted adjacent to the tail of the pancreas and spleen. Peripherally enhancing uncinate process mass consistent with the known history of pancreatic carcinoma. Stable ascites within the abdomen and pelvis. Stable left renal mass. Electronically Signed   By: Inez Catalina M.D.   On: 04/04/2018 23:20   Ct Abdomen Pelvis W Contrast  Result Date: 03/10/2018 CLINICAL DATA:  Inpatient. 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 Upper endoscopy with placement of radiation fiducial markers on 03/08/2018, now with nausea, vomiting and abdominal pain. EXAM: CT ABDOMEN AND PELVIS WITH CONTRAST TECHNIQUE: Multidetector CT imaging of the abdomen and pelvis was performed using the standard protocol following bolus administration of intravenous contrast. CONTRAST:  21m OMNIPAQUE IOHEXOL 300 MG/ML  SOLN COMPARISON:  03/08/2018 CT abdomen/pelvis. FINDINGS: Lower chest: Small dependent bilateral pleural effusions, left greater than right, new. Moderate bibasilar atelectasis. Tip of superior approach central venous catheter is seen near the cavoatrial junction. Hepatobiliary: Diffuse hepatic steatosis. No liver masses. Cholecystectomy. No biliary ductal dilatation. Pancreas: Hypoenhancing 2.3 x 2.1 cm uncinate process pancreatic mass (series 2/image 31), unchanged. Fiducial markers are again noted along right margin of the pancreatic mass. Diffuse thickening of the pancreas with prominent peripancreatic edema compatible with acute pancreatitis. There is non enhancement of the pancreatic parenchyma throughout the pancreatic body and much of the tail, compatible with necrotizing pancreatitis. No appreciable pancreatic duct dilation. Spleen: Normal size. No mass. Adrenals/Urinary Tract: Normal adrenals. Heterogeneously avidly enhancing exophytic 3.1 cm renal cortical mass in the  lateral interpolar left kidney, unchanged. No hydronephrosis. Normal bladder. Right periurethral 2.1 x 1.2 cm diverticulum now containing excreted contrast (series 2/image 80), unchanged. Stomach/Bowel: Stomach is nondistended. There is fold thickening throughout fundus and body of the stomach, not definitely changed. Normal  caliber small bowel with no small bowel wall thickening. Normal appendix. Moderate sigmoid diverticulosis, with no definite large bowel wall thickening. Vascular/Lymphatic: Normal caliber abdominal aorta. Patent hepatic and renal veins with no renal vein tumor thrombus. Stable at least moderate narrowing of portal splenic venous confluence by the mass. Reproductive: Grossly normal uterus.  No adnexal mass. Other: No pneumoperitoneum. Stable moderate periumbilical fat containing hernia. Small to moderate volume ascites is increased. Retroperitoneal fluid in left greater than right anterior paranephric spaces is mildly decreased. New mild anasarca. No focal measurable fluid collections. Musculoskeletal: No aggressive appearing focal osseous lesions. Moderate thoracolumbar spondylosis. IMPRESSION: 1. Severe acute necrotizing pancreatitis with edema extending into the anterior paranephric retroperitoneal spaces bilaterally. Absence of pancreatic parenchymal enhancement throughout the pancreatic body and much of the pancreatic tail. 2. Pancreatic neoplasm in the uncinate process is unchanged. 3. Worsening third-spacing of fluid with new small dependent bilateral pleural effusions, new mild anasarca and increased small to moderate volume ascites. 4. Fold thickening throughout stomach is nonspecific and probably reactive or due to third spacing of fluid. 5. Stable avidly enhancing exophytic 3.1 cm lateral interpolar left renal mass compatible with renal cell carcinoma. 6. No findings of metastatic disease in the abdomen or pelvis. 7. Diffuse hepatic steatosis. 8. Stable right urethral diverticulum.  Electronically Signed   By: Ilona Sorrel M.D.   On: 03/10/2018 11:56   Ct Abdomen Pelvis W Contrast  Result Date: 03/08/2018 CLINICAL DATA:  Endoscopy today with increasing nausea and bilious vomiting. Patient is currently on radiation therapy for pancreatic cancer. EXAM: CT ABDOMEN AND PELVIS WITH CONTRAST TECHNIQUE: Multidetector CT imaging of the abdomen and pelvis was performed using the standard protocol following bolus administration of intravenous contrast. CONTRAST:  36m OMNIPAQUE IOHEXOL 300 MG/ML  SOLN COMPARISON:  February 15, 2018 FINDINGS: Lower chest: There is mild atelectasis of the lung bases. The heart size is normal. Hepatobiliary: Diffuse low density of the liver without vessel displacement is identified. No focal liver lesion is noted. Patient status post prior cholecystectomy. The intra and extrahepatic biliary ducts are normal. Pancreas: Surgical clips with associated hypodense mass is identified in the pancreatic head consistent with patient's known pancreatic cancer. Spleen: Normal in size without focal abnormality. Adrenals/Urinary Tract: The bilateral adrenal glands are normal. The right kidney is normal. There is a heterogeneous enhancing partial cystic mass in the lateral midpole left kidney measuring 2.8 x 3 cm unchanged compared prior exam. Fluid-filled bladder is normal. Stomach/Bowel: There is hiatal hernia. There is mild diffuse bowel wall thickening of the stomach with surrounding fluid and inflammatory change. There is no small bowel obstruction. There is diverticulosis of colon without diverticulitis. The appendix is not definitely seen. Vascular/Lymphatic: No significant vascular findings are present. No enlarged abdominal or pelvic lymph nodes. Reproductive: Uterus and bilateral adnexa are unremarkable. Other: Moderate ascites is identified in the abdomen and pelvis. There is midline umbilical herniation of mesenteric fat. There is no free air. Musculoskeletal: Degenerative  joint changes of the spine are noted. IMPRESSION: Diffuse bowel wall thickening of the stomach. This is nonspecific. Finding can be due to infectious/inflammatory process. No free air. Mass in the pancreatic head consistent with patient's known pancreatic cancer. Moderate ascites in the abdomen and pelvis. Heterogeneous enhancing partial cystic mass in the lateral midpole left kidney unchanged suspicious for renal cell carcinoma. Electronically Signed   By: WAbelardo DieselM.D.   On: 03/08/2018 18:15   Dg Chest Port 1 View  Result Date: 03/10/2018 CLINICAL DATA:  68 year old with pneumonia. EXAM: PORTABLE CHEST 1 VIEW COMPARISON:  PET-CT 10/30/2017 FINDINGS: New patchy densities at the left lung base and difficult to exclude small left pleural effusion. Few densities along the medial right lung base. Upper lungs are clear. Heart size is within normal limits. Right jugular Port-A-Cath is present. Catheter tip is near the SVC and right atrium junction. Negative for a pneumothorax. Surgical clips in the right upper abdomen. IMPRESSION: Bibasilar chest densities, left side greater than right. Findings concerning for infection, particularly on the left side. Can not exclude small left pleural effusion. Electronically Signed   By: Markus Daft M.D.   On: 03/10/2018 09:06   Dg Abd 2 Views  Result Date: 04/04/2018 CLINICAL DATA:  Mid abdominal pain. EXAM: ABDOMEN - 2 VIEW COMPARISON:  Body CT 03/10/2018 FINDINGS: The bowel gas pattern is normal. There is no evidence of free air. No radio-opaque calculi or other significant radiographic abnormality is seen. Postsurgical changes in the right upper quadrant and mid abdomen. IMPRESSION: Nonobstructive bowel gas pattern.  No free gas seen. Electronically Signed   By: Fidela Salisbury M.D.   On: 04/04/2018 15:52    PERFORMANCE STATUS (ECOG) : 1 - Symptomatic but completely ambulatory  Review of Systems As noted above. Otherwise, a complete review of systems is  negative.  Physical Exam General: NAD, frail appearing, thin Cardiovascular: regular rate and rhythm Pulmonary: clear ant fields Abdomen: soft, tender, hypoactive bowel sounds GU: no suprapubic tenderness Extremities: no edema Skin: no rashes Neurological: Weakness but otherwise nonfocal  IMPRESSION: Patient known to me from the clinic. I met today with patient and son. Patient says she is feeling better with less pain. She tolerated a liquid diet for lunch without significant pain. No current nausea or vomiting.   Pain seems adequately managed with use of prn hydromorphone. In past 24 hours, patient has had total of 61m IV hydromorphone. She continues on transdermal fentanyl at 547m Q72H. No oxycodone has been used since admission. Total MME in past 24 hours is approx. 1206m  Patient should tolerate increased fentanyl to 64m33m72H if needed. An alternative oral long-acting opioid could also be considered if needed. Will discontinue oxycodone and start po hydromorphone for use when she is able to tolerate oral meds. Would recommend one long-acting and one short-acting opioid to simplify regimen. Patient has also been recently referred to interventional pain management for consideration of a celiac plexus block.   Case discussed with Dr. FinnGrayland OrmondPLAN: -Continue current scope of treatment -Continue IV hydromorphone for BTP -Will d/c oxycodone and start hydromorphone po for prn use when she is able to tolerate oral pain meds -Liberalize opioids if needed -Advance diet as tolerated    Time Total: 45 minutes  Visit consisted of counseling and education dealing with the complex and emotionally intense issues of symptom management and palliative care in the setting of serious and potentially life-threatening illness.Greater than 50%  of this time was spent counseling and coordinating care related to the above assessment and plan.  Signed by: JoshAltha HarmD, NP-C [phone] Cell)

## 2018-04-05 NOTE — ED Notes (Signed)
Son, Rinell, called and updated on POC and admission room assignment.

## 2018-04-06 LAB — CBC
HCT: 25.3 % — ABNORMAL LOW (ref 36.0–46.0)
Hemoglobin: 8.2 g/dL — ABNORMAL LOW (ref 12.0–15.0)
MCH: 33.5 pg (ref 26.0–34.0)
MCHC: 32.4 g/dL (ref 30.0–36.0)
MCV: 103.3 fL — ABNORMAL HIGH (ref 80.0–100.0)
PLATELETS: 207 10*3/uL (ref 150–400)
RBC: 2.45 MIL/uL — ABNORMAL LOW (ref 3.87–5.11)
RDW: 17 % — ABNORMAL HIGH (ref 11.5–15.5)
WBC: 14.2 10*3/uL — ABNORMAL HIGH (ref 4.0–10.5)
nRBC: 0 % (ref 0.0–0.2)

## 2018-04-06 LAB — BASIC METABOLIC PANEL
Anion gap: 4 — ABNORMAL LOW (ref 5–15)
BUN: 10 mg/dL (ref 8–23)
CO2: 24 mmol/L (ref 22–32)
Calcium: 7.8 mg/dL — ABNORMAL LOW (ref 8.9–10.3)
Chloride: 110 mmol/L (ref 98–111)
Creatinine, Ser: 0.83 mg/dL (ref 0.44–1.00)
GFR calc Af Amer: >60 mL/min (ref >60)
Glucose, Bld: 141 mg/dL — ABNORMAL HIGH (ref 70–99)
Potassium: 2.9 mmol/L — ABNORMAL LOW (ref 3.5–5.1)
SODIUM: 138 mmol/L (ref 135–145)

## 2018-04-06 LAB — AMYLASE: Amylase: 83 U/L (ref 28–100)

## 2018-04-06 MED ORDER — FENTANYL 50 MCG/HR TD PT72
1.0000 | MEDICATED_PATCH | TRANSDERMAL | Status: DC
  Administered 2018-04-06: 1 via TRANSDERMAL
  Filled 2018-04-06: qty 1

## 2018-04-06 NOTE — Progress Notes (Signed)
[place] [facility] [place] NAME: Cindy Bowman    MR#:  [id number]  DATE OF BIRTH:  1950/09/07  SUBJECTIVE:  CHIEF COMPLAINT:   Chief Complaint  Patient presents with  . Abdominal Pain   The patient has better abdominal pain, 6 out of 10 without radiation.  She tolerated clear liquid diet. REVIEW OF SYSTEMS:  Review of Systems  Constitutional: Negative for chills, fever and malaise/fatigue.  HENT: Negative for sore throat.   Eyes: Negative for blurred vision and double vision.  Respiratory: Negative for cough, hemoptysis, shortness of breath, wheezing and stridor.   Cardiovascular: Negative for chest pain, palpitations, orthopnea and leg swelling.  Gastrointestinal: Positive for abdominal pain. Negative for blood in stool, diarrhea, melena, nausea and vomiting.  Genitourinary: Negative for dysuria, flank pain and hematuria.  Musculoskeletal: Negative for back pain and joint pain.  Skin: Negative for rash.  Neurological: Negative for dizziness, sensory change, focal weakness, seizures, loss of consciousness, weakness and headaches.  Endo/Heme/Allergies: Negative for polydipsia.  Psychiatric/Behavioral: Negative for depression. The patient is not nervous/anxious.     DRUG ALLERGIES:   Allergies  Allergen Reactions  . Nsaids Other (See Comments)    Decreased GFR  . Ibuprofen     Lowers kidney function  . Penicillins     Yeast infection  Has patient had a PCN reaction causing immediate rash, facial/tongue/throat swelling, SOB or lightheadedness with hypotension: No Has patient had a PCN reaction causing severe rash involving mucus membranes or skin necrosis: No Has patient had a PCN reaction that required hospitalization: No Has patient had a PCN reaction occurring within the last 10 years: No If all of the above answers are "NO", then may proceed with Cephalosporin use.    VITALS:  Blood pressure (!) 165/64, pulse (!) 103, temperature  98.4 F (36.9 C), temperature source Oral, resp. rate 17, height 5\' 3"  (1.6 m), weight 60.3 kg, SpO2 91 %. PHYSICAL EXAMINATION:  Physical Exam Constitutional:      General: She is not in acute distress. HENT:     Head: Normocephalic.     Mouth/Throat:     Mouth: Mucous membranes are moist.  Eyes:     General: No scleral icterus.    Conjunctiva/sclera: Conjunctivae normal.     Pupils: Pupils are equal, round, and reactive to light.  Neck:     Musculoskeletal: Normal range of motion and neck supple.     Vascular: No JVD.     Trachea: No tracheal deviation.  Cardiovascular:     Rate and Rhythm: Normal rate and regular rhythm.     Heart sounds: Normal heart sounds. No murmur. No gallop.   Pulmonary:     Effort: Pulmonary effort is normal. No respiratory distress.     Breath sounds: Normal breath sounds. No wheezing or rales.  Abdominal:     General: Bowel sounds are normal. There is no distension.     Palpations: Abdomen is soft.     Tenderness: There is abdominal tenderness. There is no guarding or rebound.  Musculoskeletal: Normal range of motion.        General: No tenderness.     Right lower leg: No edema.     Left lower leg: No edema.  Skin:    Findings: No erythema or rash.  Neurological:     General: No focal deficit present.     Mental Status: She is alert and oriented to person, place, and time.  Cranial Nerves: No cranial nerve deficit.  Psychiatric:        Mood and Affect: Mood normal.    LABORATORY PANEL:  Female CBC Recent Labs  Lab 04/04/18 1410  WBC 19.5*  HGB 11.3*  HCT 35.3*  PLT 318   ------------------------------------------------------------------------------------------------------------------ Chemistries  Recent Labs  Lab 04/02/18 0930 04/04/18 1410  NA 140 140  K 3.0* 3.9  CL 107 108  CO2 23 22  GLUCOSE 115* 164*  BUN 10 13  CREATININE 1.10* 1.28*  CALCIUM 8.5* 8.7*  MG 1.7  --   AST 33 27  ALT 15 14  ALKPHOS 98 78  BILITOT  1.0 0.9   RADIOLOGY:  No results found. ASSESSMENT AND PLAN:   *Acute on chronic abdominal pain Suspected due to worsening pancreatic cancer, pancreatic pseudocyst, and pancreatitis Continue IV fluids, pain control. Advance to full liquid diet.  resumed Eliquis.  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 Gary Fleet recommendation.  *Acute pancreatitis with pancreatic pseudocyst Most likely secondary to pancreatic cancer Pain control.  *Chronic hypertension Hydralazine as needed systolic blood pressure greater than 160, continue losartan, vitals per routine, make changes as per necessary  Leukocytosis.  Unclear etiology.  No evidence of infection.  Follow-up CBC.  All the records are reviewed and case discussed with Care Management/Social Worker. Management plans discussed with the patient, her son and they are in agreement.  CODE STATUS: Full Code  TOTAL TIME TAKING CARE OF THIS PATIENT: 27 minutes.   More than 50% of the time was spent in counseling/coordination of care: YES  POSSIBLE D/C IN 1-2 DAYS, DEPENDING ON CLINICAL CONDITION.   Demetrios Loll M.D on 04/06/2018 at 12:27 PM  Between 7am to 6pm - Pager - [phone]  After 6pm go to [email or web address] - [facility]

## 2018-04-07 LAB — MAGNESIUM: Magnesium: 1.5 mg/dL — ABNORMAL LOW (ref 1.7–2.4)

## 2018-04-07 MED ORDER — POTASSIUM CHLORIDE CRYS ER 20 MEQ PO TBCR
40.0000 meq | EXTENDED_RELEASE_TABLET | Freq: Two times a day (BID) | ORAL | Status: AC
  Administered 2018-04-07 – 2018-04-08 (×2): 40 meq via ORAL
  Filled 2018-04-07 (×3): qty 2

## 2018-04-07 MED ORDER — OXYCODONE-ACETAMINOPHEN 5-325 MG PO TABS
1.0000 | ORAL_TABLET | Freq: Four times a day (QID) | ORAL | Status: DC | PRN
  Administered 2018-04-07 – 2018-04-08 (×4): 1 via ORAL
  Filled 2018-04-07 (×4): qty 1

## 2018-04-07 NOTE — Progress Notes (Signed)
[place] at [place] NAME: Cindy Bowman    MR#:  [id number]  DATE OF BIRTH:  14-Dec-1950  SUBJECTIVE:  CHIEF COMPLAINT:   Chief Complaint  Patient presents with  . Abdominal Pain   The patient feels sick and still abdominal pain, 8 out of 10 without radiation.  She tolerated full liquid diet. REVIEW OF SYSTEMS:  Review of Systems  Constitutional: Negative for chills, fever and malaise/fatigue.  HENT: Negative for sore throat.   Eyes: Negative for blurred vision and double vision.  Respiratory: Negative for cough, hemoptysis, shortness of breath, wheezing and stridor.   Cardiovascular: Negative for chest pain, palpitations, orthopnea and leg swelling.  Gastrointestinal: Positive for abdominal pain. Negative for blood in stool, diarrhea, melena, nausea and vomiting.  Genitourinary: Negative for dysuria, flank pain and hematuria.  Musculoskeletal: Negative for back pain and joint pain.  Skin: Negative for rash.  Neurological: Negative for dizziness, sensory change, focal weakness, seizures, loss of consciousness, weakness and headaches.  Endo/Heme/Allergies: Negative for polydipsia.  Psychiatric/Behavioral: Negative for depression. The patient is not nervous/anxious.     DRUG ALLERGIES:   Allergies  Allergen Reactions  . Nsaids Other (See Comments)    Decreased GFR  . Ibuprofen     Lowers kidney function  . Penicillins     Yeast infection  Has patient had a PCN reaction causing immediate rash, facial/tongue/throat swelling, SOB or lightheadedness with hypotension: No Has patient had a PCN reaction causing severe rash involving mucus membranes or skin necrosis: No Has patient had a PCN reaction that required hospitalization: No Has patient had a PCN reaction occurring within the last 10 years: No If all of the above answers are "NO", then may proceed with Cephalosporin use.    VITALS:  Blood pressure (!) 158/74, pulse 90,  temperature 98.7 F (37.1 C), temperature source Oral, resp. rate 17, height 5\' 3"  (1.6 m), weight 60.3 kg, SpO2 98 %. PHYSICAL EXAMINATION:  Physical Exam Constitutional:      General: She is not in acute distress. HENT:     Head: Normocephalic.     Mouth/Throat:     Mouth: Mucous membranes are moist.  Eyes:     General: No scleral icterus.    Conjunctiva/sclera: Conjunctivae normal.     Pupils: Pupils are equal, round, and reactive to light.  Neck:     Musculoskeletal: Normal range of motion and neck supple.     Vascular: No JVD.     Trachea: No tracheal deviation.  Cardiovascular:     Rate and Rhythm: Normal rate and regular rhythm.     Heart sounds: Normal heart sounds. No murmur. No gallop.   Pulmonary:     Effort: Pulmonary effort is normal. No respiratory distress.     Breath sounds: Normal breath sounds. No wheezing or rales.  Abdominal:     General: Bowel sounds are normal. There is no distension.     Palpations: Abdomen is soft.     Tenderness: There is abdominal tenderness. There is no guarding or rebound.  Musculoskeletal: Normal range of motion.        General: No tenderness.     Right lower leg: No edema.     Left lower leg: No edema.  Skin:    Findings: No erythema or rash.  Neurological:     General: No focal deficit present.     Mental Status: She is alert and oriented to person, place, and time.  Cranial Nerves: No cranial nerve deficit.  Psychiatric:        Mood and Affect: Mood normal.    LABORATORY PANEL:  Female CBC Recent Labs  Lab 04/06/18 1314  WBC 14.2*  HGB 8.2*  HCT 25.3*  PLT 207   ------------------------------------------------------------------------------------------------------------------ Chemistries  Recent Labs  Lab 04/02/18 0930 04/04/18 1410 04/06/18 1314  NA 140 140 138  K 3.0* 3.9 2.9*  CL 107 108 110  CO2 23 22 24   GLUCOSE 115* 164* 141*  BUN 10 13 10   CREATININE 1.10* 1.28* 0.83  CALCIUM 8.5* 8.7* 7.8*  MG  1.7  --   --   AST 33 27  --   ALT 15 14  --   ALKPHOS 98 78  --   BILITOT 1.0 0.9  --    RADIOLOGY:  No results found. ASSESSMENT AND PLAN:   *Acute on chronic abdominal pain Suspected due to worsening pancreatic cancer, pancreatic pseudocyst, and pancreatitis Continue IV fluids, pain control. Advance to soft diet.  resumed Eliquis.  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 Gary Fleet recommendation.  *Acute pancreatitis with pancreatic pseudocyst Most likely secondary to pancreatic cancer Pain control.  *Chronic hypertension Hydralazine as needed systolic blood pressure greater than 160, continue losartan, vitals per routine, make changes as per necessary  Leukocytosis.  Unclear etiology.  No evidence of infection.  Follow-up CBC. Hypokalemia.  Potassium supplement. Anemia of chronic disease.  Follow-up hemoglobin.  All the records are reviewed and case discussed with Care Management/Social Worker. Management plans discussed with the patient, her son and they are in agreement.  CODE STATUS: Full Code  TOTAL TIME TAKING CARE OF THIS PATIENT: 27 minutes.   More than 50% of the time was spent in counseling/coordination of care: YES  POSSIBLE D/C IN 1-2 DAYS, DEPENDING ON CLINICAL CONDITION.   Demetrios Loll M.D on 04/07/2018 at 12:31 PM  Between 7am to 6pm - Pager - [phone]  After 6pm go to [email or web address] - [facility]

## 2018-04-08 ENCOUNTER — Telehealth: Payer: Self-pay | Admitting: *Deleted

## 2018-04-08 DIAGNOSIS — K859 Acute pancreatitis without necrosis or infection, unspecified: Secondary | ICD-10-CM

## 2018-04-08 DIAGNOSIS — E440 Moderate protein-calorie malnutrition: Secondary | ICD-10-CM

## 2018-04-08 LAB — CBC
HCT: 26.2 % — ABNORMAL LOW (ref 36.0–46.0)
Hemoglobin: 8.6 g/dL — ABNORMAL LOW (ref 12.0–15.0)
MCH: 33 pg (ref 26.0–34.0)
MCHC: 32.8 g/dL (ref 30.0–36.0)
MCV: 100.4 fL — ABNORMAL HIGH (ref 80.0–100.0)
Platelets: 277 10*3/uL (ref 150–400)
RBC: 2.61 MIL/uL — ABNORMAL LOW (ref 3.87–5.11)
RDW: 16.3 % — ABNORMAL HIGH (ref 11.5–15.5)
WBC: 12.1 10*3/uL — ABNORMAL HIGH (ref 4.0–10.5)
nRBC: 0 % (ref 0.0–0.2)

## 2018-04-08 LAB — BASIC METABOLIC PANEL WITH GFR
Anion gap: 8 (ref 5–15)
BUN: <5 mg/dL — ABNORMAL LOW (ref 8–23)
CO2: 25 mmol/L (ref 22–32)
Calcium: 8.2 mg/dL — ABNORMAL LOW (ref 8.9–10.3)
Chloride: 107 mmol/L (ref 98–111)
Creatinine, Ser: 0.93 mg/dL (ref 0.44–1.00)
GFR calc Af Amer: >60 mL/min
GFR calc non Af Amer: >60 mL/min
Glucose, Bld: 98 mg/dL (ref 70–99)
Potassium: 2.9 mmol/L — ABNORMAL LOW (ref 3.5–5.1)
Sodium: 140 mmol/L (ref 135–145)

## 2018-04-08 LAB — MAGNESIUM: Magnesium: 2.4 mg/dL (ref 1.7–2.4)

## 2018-04-08 LAB — POTASSIUM: Potassium: 3 mmol/L — ABNORMAL LOW (ref 3.5–5.1)

## 2018-04-08 MED ORDER — MAGNESIUM SULFATE 2 GM/50ML IV SOLN
2.0000 g | INTRAVENOUS | Status: AC
  Administered 2018-04-08: 2 g via INTRAVENOUS
  Filled 2018-04-08: qty 50

## 2018-04-08 MED ORDER — POTASSIUM CHLORIDE CRYS ER 20 MEQ PO TBCR
40.0000 meq | EXTENDED_RELEASE_TABLET | ORAL | Status: AC
  Administered 2018-04-08: 11:00:00 40 meq via ORAL
  Filled 2018-04-08: qty 2

## 2018-04-08 MED ORDER — HEPARIN SOD (PORK) LOCK FLUSH 100 UNIT/ML IV SOLN
500.0000 [IU] | Freq: Once | INTRAVENOUS | Status: DC
  Filled 2018-04-08: qty 5

## 2018-04-08 MED ORDER — POTASSIUM CHLORIDE 10 MEQ/100ML IV SOLN
10.0000 meq | INTRAVENOUS | Status: AC
  Administered 2018-04-08 (×2): 10 meq via INTRAVENOUS
  Filled 2018-04-08 (×4): qty 100

## 2018-04-08 NOTE — Progress Notes (Addendum)
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 Intake/Output this shift: No intake/output data recorded.  Physical exam: NAD, chronically ill Abd: soft, mild diffuse tenderness. No peritonitis Ext: warm and well perfused  Lab Results: CBC  Recent Labs    04/06/18 1314 04/08/18 0456  WBC 14.2* 12.1*  HGB 8.2* 8.6*  HCT 25.3* 26.2*  PLT 207 277   BMET Recent Labs    04/06/18 1314 04/08/18 0456  NA 138 140  K 2.9* 2.9*  CL 110 107  CO2 24 25  GLUCOSE 141* 98  BUN 10 <5*  CREATININE 0.83 0.93  CALCIUM 7.8* 8.2*   PT/INR No results for input(s): LABPROT, INR in the last 72 hours. ABG No results for input(s): PHART, HCO3 in the last 72 hours.  Invalid input(s): PCO2, PO2  Studies/Results: No results found.  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

## 2018-04-08 NOTE — Telephone Encounter (Signed)
See me and josh tomorrow if possible.

## 2018-04-08 NOTE — Discharge Instructions (Signed)
Acute Pancreatitis ° °Acute pancreatitis happens when the pancreas gets swollen. The pancreas is a large gland behind the stomach. The pancreas helps control blood sugar. It also makes enzymes that help digest food. This condition happens when the enzymes attack the pancreas and damage it. Most attacks last a couple of days and are dangerous. The lungs, heart, and kidneys may stop working. °What are the causes? °· Alcohol abuse. °· Drug abuse. °· Gallstones. °· Some medicines. °· Some chemicals. °· Infection. °· Damage caused by an accident.. °· Belly (abdominal) surgery. °· In some cases, the cause is not known. °What are the signs or symptoms? °· Pain in the upper belly and back. °· Swelling of the belly °· Feeling sick to your stomach (nausea) and throwing up (vomiting). °How is this treated? °· You will probably have to stay in the hospital. °? Treatment may include: °§ Fluid through an IV. °§ A tube to remove stomach contents and stop you from throwing up. °§ Not eating for 3-4 days. °§ Pain medicine. °§ Antibiotic medicines if you have an infection. °§ Surgery on the pancreas or gallbladder. °Follow these instructions at home: °Eating and drinking ° °· Follow instructions from your doctor about diet. °· Eat small meals often. Avoid eating big meals. °· Eat foods that do not have a lot of fat in them. °· Drink enough fluid to keep your pee (urine) pale yellow. °· Do not drink alcohol if it caused your condition. °General instructions °· Take over-the-counter and prescription medicines only as told by your doctor. °· Do not use cigarettes, e-cigarettes, and chewing tobacco. If you need help quitting, ask your doctor. °· Get plenty of rest. °· If directed, check your blood sugar at home as told by your doctor. °· Keep all follow-up visits as told by your doctor. This is important. °Contact a doctor if: °· You do not get better as quickly as expected. °· You have new symptoms. °· Your symptoms get worse. °· You  have lasting pain or weakness. °· You continue to feel sick to your stomach. °· You get better and then you have another pain attack. °· You have a fever. °Get help right away if: °· You cannot eat or keep fluids down. °· Your pain becomes very bad. °· Your skin or the white part of your eyes turns yellow. °· You throw up. °· You feel dizzy or you pass out. °· Your blood sugar is high (over 300 mg/dL). °Summary °· Acute pancreatitis happens when the pancreas gets swollen. °· This condition is usually caused by alcohol abuse, drug abuse, or gallstones. °· You will probably have to stay in the hospital for treatment. °This information is not intended to replace advice given to you by your health care provider. Make sure you discuss any questions you have with your health care provider. °Document Released: 07/26/2007 Document Revised: 06/12/2016 Document Reviewed: 11/10/2014 °Elsevier Interactive Patient Education © 2019 [employer] ° °

## 2018-04-08 NOTE — Care Management (Signed)
Patient is currently followed by [facility] RN and PT.  Adding aide to service.  In need of walker. No agency preference. Obtained order for walker and [facility] will provide

## 2018-04-08 NOTE — Telephone Encounter (Signed)
Call from s/o reporting patient is to be discharged and admitting doctor saying they will send her home on same pain medicine regimen she is on now, but he states it is not holding her pain controlled the entire time and he wants Dr Grayland Ormond to step in to help with pain management.

## 2018-04-08 NOTE — Care Management Important Message (Signed)
Important Message  Patient Details  Name: Berea Majkowski MRN: [id number] Date of Birth: 16-May-1950   Medicare Important Message Given:  Yes    Juliann Pulse A Kysha Muralles 04/08/2018, 11:55 AM

## 2018-04-08 NOTE — Telephone Encounter (Signed)
Appointment accepted for 9 AM tomorrow 

## 2018-04-09 ENCOUNTER — Telehealth: Payer: Self-pay | Admitting: *Deleted

## 2018-04-09 ENCOUNTER — Other Ambulatory Visit: Payer: Self-pay | Admitting: *Deleted

## 2018-04-09 ENCOUNTER — Inpatient Hospital Stay (HOSPITAL_BASED_OUTPATIENT_CLINIC_OR_DEPARTMENT_OTHER): Payer: Medicare Other | Admitting: Oncology

## 2018-04-09 ENCOUNTER — Other Ambulatory Visit: Payer: Self-pay

## 2018-04-09 ENCOUNTER — Ambulatory Visit
Admission: RE | Admit: 2018-04-09 | Discharge: 2018-04-09 | Disposition: A | Discharge status: Home / Self Care | Payer: Medicare Other | Source: Ambulatory Visit | Attending: Radiation Oncology | Admitting: Radiation Oncology

## 2018-04-09 ENCOUNTER — Inpatient Hospital Stay (HOSPITAL_BASED_OUTPATIENT_CLINIC_OR_DEPARTMENT_OTHER): Payer: Medicare Other | Admitting: Hospice and Palliative Medicine

## 2018-04-09 VITALS — BP 144/77 | HR 96 | Temp 97.9°F | Ht 63.0 in | Wt 129.0 lb

## 2018-04-09 DIAGNOSIS — D649 Anemia, unspecified: Secondary | ICD-10-CM | POA: Diagnosis not present

## 2018-04-09 DIAGNOSIS — R399 Unspecified symptoms and signs involving the genitourinary system: Secondary | ICD-10-CM

## 2018-04-09 DIAGNOSIS — N2889 Other specified disorders of kidney and ureter: Secondary | ICD-10-CM

## 2018-04-09 DIAGNOSIS — E876 Hypokalemia: Secondary | ICD-10-CM

## 2018-04-09 DIAGNOSIS — C259 Malignant neoplasm of pancreas, unspecified: Secondary | ICD-10-CM

## 2018-04-09 DIAGNOSIS — R52 Pain, unspecified: Secondary | ICD-10-CM

## 2018-04-09 DIAGNOSIS — G893 Neoplasm related pain (acute) (chronic): Secondary | ICD-10-CM

## 2018-04-09 DIAGNOSIS — Z515 Encounter for palliative care: Secondary | ICD-10-CM | POA: Diagnosis not present

## 2018-04-09 DIAGNOSIS — R1033 Periumbilical pain: Secondary | ICD-10-CM | POA: Diagnosis not present

## 2018-04-09 DIAGNOSIS — R531 Weakness: Secondary | ICD-10-CM

## 2018-04-09 DIAGNOSIS — R630 Anorexia: Secondary | ICD-10-CM

## 2018-04-09 DIAGNOSIS — K5900 Constipation, unspecified: Secondary | ICD-10-CM | POA: Diagnosis not present

## 2018-04-09 DIAGNOSIS — Z7901 Long term (current) use of anticoagulants: Secondary | ICD-10-CM

## 2018-04-09 DIAGNOSIS — R109 Unspecified abdominal pain: Secondary | ICD-10-CM | POA: Diagnosis not present

## 2018-04-09 DIAGNOSIS — Z510 Encounter for antineoplastic radiation therapy: Secondary | ICD-10-CM | POA: Diagnosis not present

## 2018-04-09 DIAGNOSIS — I824Z2 Acute embolism and thrombosis of unspecified deep veins of left distal lower extremity: Secondary | ICD-10-CM | POA: Diagnosis not present

## 2018-04-09 DIAGNOSIS — R5382 Chronic fatigue, unspecified: Secondary | ICD-10-CM

## 2018-04-09 DIAGNOSIS — E860 Dehydration: Secondary | ICD-10-CM | POA: Diagnosis not present

## 2018-04-09 DIAGNOSIS — M545 Low back pain: Secondary | ICD-10-CM | POA: Diagnosis not present

## 2018-04-09 DIAGNOSIS — R110 Nausea: Secondary | ICD-10-CM | POA: Diagnosis not present

## 2018-04-09 LAB — URINALYSIS, COMPLETE (UACMP) WITH MICROSCOPIC
Bacteria, UA: NONE SEEN
Bilirubin Urine: NEGATIVE
Glucose, UA: NEGATIVE mg/dL
Hgb urine dipstick: NEGATIVE
Ketones, ur: 20 mg/dL — AB
Leukocytes,Ua: NEGATIVE
Nitrite: NEGATIVE
Protein, ur: 30 mg/dL — AB
Specific Gravity, Urine: 1.02 (ref 1.005–1.030)
pH: 6 (ref 5.0–8.0)

## 2018-04-09 MED ORDER — GABAPENTIN 300 MG PO CAPS: 300.0000 mg | ORAL_CAPSULE | Freq: Every day | ORAL | 0 refills | Status: DC

## 2018-04-09 MED ORDER — MEGESTROL ACETATE 40 MG PO TABS: 40.0000 mg | ORAL_TABLET | Freq: Every day | ORAL | 2 refills | Status: DC

## 2018-04-09 NOTE — Telephone Encounter (Signed)
Insurance does not cover medicine she was given today. I asked her to give [place] time to work on prior authorization for Megace, she is in agreement with this

## 2018-04-09 NOTE — Progress Notes (Signed)
[place]  [phone] [phone]   Name: Cindy Bowman Date: 04/09/2018 MRN: [id number]  DOB: 12/30/1950  Patient Care Team: Glendon Axe, MD as PCP - General (Internal Medicine) Clent Jacks, RN as Registered Nurse Lloyd Huger, MD as Medical Oncologist (Medical Oncology)    REASON FOR CONSULTATION: Palliative Care consult requested for this67 y.o.femalewith multiple medical problems including stage Ib adenocarcinoma of the pancreas (diagnosed 09/2017) currently receiving neoadjuvant FOLFIRINOX plan for stereotactic XRT prior to resection. Patient developed necrotizing pancreatitis after fiduciary placement procedure at [facility] which was subsequently managed conservatively. She was hospitalized 04/05/18 to 04/09/18 with intractable abdominal pain. CT scan revealed large pancreatic pseudocyst without evidence of further necrosis. She was  Managed conservatively. Patient has been referred to palliative care to help with symptom management and address treatment goals.  SOCIAL HISTORY:    Patient is divorced.  She has one son with whom she lives.  Patient moved here from [place].  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  ADVANCE DIRECTIVES:  Patient says her son is her healthcare power of attorney.  Patient says she has a living will.  CODE STATUS: DNR (MOST completed on 12/01/18)  PAST MEDICAL HISTORY: Past Medical History:  Diagnosis Date  . Essential hypertension   . Pancreatic cancer ([place])   . UTI (urinary tract infection)     PAST SURGICAL HISTORY:  Past Surgical History:  Procedure Laterality Date  . CESAREAN SECTION    . CHOLECYSTECTOMY    . EUS N/A 10/25/2017   Procedure: FULL UPPER ENDOSCOPIC ULTRASOUND (EUS) RADIAL;  Surgeon: Jola Schmidt, MD;  Location: [facility] ENDOSCOPY;  Service: Endoscopy;  Laterality: N/A;  . OOPHORECTOMY    . PORTA CATH INSERTION N/A 11/12/2017   Procedure: PORTA CATH INSERTION;   Surgeon: Algernon Huxley, MD;  Location: [place] [facility];  Service: Cardiovascular;  Laterality: N/A;  . TUBAL LIGATION      HEMATOLOGY/ONCOLOGY HISTORY:    Pancreatic adenocarcinoma ([place])   10/18/2017 Initial Diagnosis    Pancreatic adenocarcinoma ([place])    10/26/2017 Cancer Staging    Staging form: Exocrine Pancreas, AJCC 8th Edition - Clinical stage from 10/26/2017: Stage IB (cT2, cN0, cM0) - Signed by Lloyd Huger, MD on 10/26/2017    11/06/2017 - 12/11/2017 Chemotherapy    The patient had palonosetron (ALOXI) injection 0.25 mg, 0.25 mg, Intravenous,  Once, 2 of 4 cycles Administration: 0.25 mg (11/14/2017), 0.25 mg (11/28/2017) irinotecan (CAMPTOSAR) 340 mg in dextrose 5 % 500 mL chemo infusion, 180 mg/m2 = 340 mg, Intravenous,  Once, 2 of 4 cycles Dose modification: 160 mg/m2 (original dose 180 mg/m2, Cycle 2, Reason: Dose not tolerated) Administration: 340 mg (11/14/2017), 300 mg (11/28/2017) leucovorin 750 mg in dextrose 5 % 250 mL infusion, 760 mg, Intravenous,  Once, 2 of 4 cycles Dose modification: 360 mg/m2 (original dose 400 mg/m2, Cycle 2, Reason: Dose not tolerated) Administration: 750 mg (11/14/2017), 700 mg (11/28/2017) oxaliplatin (ELOXATIN) 150 mg in dextrose 5 % 500 mL chemo infusion, 160 mg, Intravenous,  Once, 2 of 4 cycles Dose modification: 70 mg/m2 (original dose 85 mg/m2, Cycle 2, Reason: Dose not tolerated) Administration: 150 mg (11/14/2017), 135 mg (11/28/2017) fluorouracil (ADRUCIL) chemo injection 750 mg, 400 mg/m2 = 750 mg, Intravenous,  Once, 2 of 4 cycles Dose modification: 360 mg/m2 (original dose 400 mg/m2, Cycle 2, Reason: Dose not tolerated) Administration: 750 mg (11/14/2017), 700 mg (11/28/2017) fluorouracil (ADRUCIL) 4,550 mg in sodium chloride 0.9 %  59 mL chemo infusion, 2,400 mg/m2 = 4,550 mg, Intravenous, 1 Day/Dose, 2 of 4 cycles Dose modification: 2,000 mg/m2 (original dose 2,400 mg/m2, Cycle 2, Reason: Dose not tolerated) Administration: 4,550 mg  (11/14/2017), 3,800 mg (11/28/2017)  for chemotherapy treatment.     12/26/2017 -  Chemotherapy    The patient had gemcitabine (GEMZAR) 1,800 mg in sodium chloride 0.9 % 250 mL chemo infusion, 1,786 mg, Intravenous,  Once, 3 of 4 cycles Administration: 1,800 mg (12/26/2017), 1,800 mg (01/02/2018), 1,800 mg (01/09/2018), 1,800 mg (01/23/2018), 1,800 mg (01/30/2018)  for chemotherapy treatment.      ALLERGIES:  is allergic to nsaids; ibuprofen; and penicillins.  MEDICATIONS:  Current Outpatient Medications  Medication Sig Dispense Refill  . apixaban (ELIQUIS) 5 MG TABS tablet Take 1 tablet (5 mg total) by mouth 2 (two) times daily. 60 tablet 3  . fentaNYL (DURAGESIC) 12 MCG/HR Place 1 patch onto the skin 1 day or 1 dose.    . folic acid (FOLVITE) 1 MG tablet Take by mouth.    Marland Kitchen HYDROmorphone (DILAUDID) 2 MG tablet Take 1 tablet (2 mg total) by mouth every 4 (four) hours as needed for severe pain. 30 tablet 0  . loperamide (IMODIUM) 2 MG capsule Take 2 mg by mouth as needed for diarrhea or loose stools.     Marland Kitchen losartan (COZAAR) 50 MG tablet Take 1 tablet (50 mg total) by mouth daily. 30 tablet 0  . NARCAN 4 MG/0.1ML LIQD nasal spray kit     . ondansetron (ZOFRAN ODT) 4 MG disintegrating tablet Take 1 tablet (4 mg total) by mouth every 8 (eight) hours as needed for nausea or vomiting. 20 tablet 0  . oxyCODONE (ROXICODONE) 15 MG immediate release tablet Take 1 - 2 tabs (15 - 30 mg) every 3 hours as needed for pain 60 tablet 0  . prochlorperazine (COMPAZINE) 10 MG tablet Take by mouth.     No current facility-administered medications for this visit.    Facility-Administered Medications Ordered in Other Visits  Medication Dose Route Frequency Provider Last Rate Last Dose  . prochlorperazine (COMPAZINE) injection 10 mg  10 mg Intravenous Once Rexene Agent        VITAL SIGNS: There were no vitals taken for this visit. There were no vitals filed for this visit.  Estimated body mass index is 22.85  kg/m as calculated from the following:   Height as of an earlier encounter on 04/09/18: _0  (1.6 m).   Weight as of an earlier encounter on 04/09/18: 129 lb (58.5 kg).  LABS: CBC:    Component Value Date/Time   WBC 12.1 (H) 04/08/2018 0456   HGB 8.6 (L) 04/08/2018 0456   HCT 26.2 (L) 04/08/2018 0456   PLT 277 04/08/2018 0456   MCV 100.4 (H) 04/08/2018 0456   NEUTROABS 15.3 (H) 04/04/2018 1410   LYMPHSABS 2.6 04/04/2018 1410   MONOABS 1.5 (H) 04/04/2018 1410   EOSABS 0.0 04/04/2018 1410   BASOSABS 0.0 04/04/2018 1410   Comprehensive Metabolic Panel:    Component Value Date/Time   NA 140 04/08/2018 0456   K 3.0 (L) 04/08/2018 1218   CL 107 04/08/2018 0456   CO2 25 04/08/2018 0456   BUN <5 (L) 04/08/2018 0456   CREATININE 0.93 04/08/2018 0456   GLUCOSE 98 04/08/2018 0456   CALCIUM 8.2 (L) 04/08/2018 0456   AST 27 04/04/2018 1410   ALT 14 04/04/2018 1410   ALKPHOS 78 04/04/2018 1410   BILITOT 0.9 04/04/2018 1410  PROT 6.9 04/04/2018 1410   ALBUMIN 2.6 (L) 04/04/2018 1410    RADIOGRAPHIC STUDIES: Ct Abdomen Pelvis W Contrast  Result Date: 04/04/2018 CLINICAL DATA:  Abdominal pain, history of pancreatic carcinoma and elevated white blood cell count EXAM: CT ABDOMEN AND PELVIS WITH CONTRAST TECHNIQUE: Multidetector CT imaging of the abdomen and pelvis was performed using the standard protocol following bolus administration of intravenous contrast. CONTRAST:  34m OMNIPAQUE IOHEXOL 300 MG/ML  SOLN COMPARISON:  03/10/2018 FINDINGS: Lower chest: Left-sided pleural effusion is stable. Bibasilar mild atelectatic changes are again noted. Hepatobiliary: The liver is diffusely decreased in attenuation consistent with fatty infiltration. The gallbladder has been surgically removed. No focal mass lesion in the liver is seen. Pancreas: There is a focal hypodense mass lesion with peripheral enhancement identified within the uncinate process of the pancreas consistent with the patient's given  clinical history of pancreatic carcinoma. Adjacent fiducial markers are seen. The lesion abuts the superior mesenteric artery consistent with neoplasm and vascular encasement. Occupying the majority of the body of the pancreas there is a an ovoid fluid collection which measures 9.8 x 5.0 cm. 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 Peripheral enhancement is noted. The tail of the pancreas appears within normal limits. A few smaller peripancreatic fluid collections are identified consistent with pseudocysts. The largest of these smaller fluid collections is seen on image number 17 of series 2 in the splenic hilum measuring 3.5 cm in greatest dimension. Spleen: Normal in size without focal abnormality. Adrenals/Urinary Tract: Adrenal glands are within normal limits. Kidneys are well visualized bilaterally. An enhancing mass lesion arising from the left kidney is again noted and stable. This did not demonstrate hypermetabolic nature on prior PET-CT. It again measures approximately 3.1 cm in dimension. No obstructive changes are seen. The bladder is decompressed. The known periurethral diverticulum on the right is less well appreciated due to the lack of contrast opacification. Stomach/Bowel: Diverticular change of the colon is noted without evidence of diverticulitis. The appendix is within normal limits. No obstructive change in the small bowel is identified. Vascular/Lymphatic: No significant vascular findings are present. No enlarged abdominal or pelvic lymph nodes. Reproductive: Uterus is within normal limits. No adnexal mass is seen. Other: Free fluid is noted within the pelvis as well as adjacent to the liver and spleen relatively stable from the previous exam. Fat containing periumbilical hernia is again seen and stable. Musculoskeletal: Degenerative changes of lumbar spine are noted. No definitive lytic or sclerotic lesions to suggest bony  metastatic disease are seen. No compression deformities are noted. 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 Smaller pseudocysts are noted adjacent to the tail of the pancreas and spleen. Peripherally enhancing uncinate process mass consistent with the known history of pancreatic carcinoma. Stable ascites within the abdomen and pelvis. Stable left renal mass. Electronically Signed   By: MInez CatalinaM.D.   On: 04/04/2018 23:20   Dg Abd 2 Views  Result Date: 04/04/2018 CLINICAL DATA:  Mid abdominal pain. EXAM: ABDOMEN - 2 VIEW COMPARISON:  Body CT 03/10/2018 FINDINGS: The bowel gas pattern is normal. There is no evidence of free air. No radio-opaque calculi or other significant radiographic abnormality is seen. Postsurgical changes in the right upper quadrant and mid abdomen. IMPRESSION: Nonobstructive bowel gas pattern.  No free gas seen. Electronically Signed   By: DThomas Hoff  Dimitrova M.D.   On: 04/04/2018 15:52    PERFORMANCE STATUS (ECOG) : 1 - Symptomatic but completely ambulatory  Review of Systems As noted above. Otherwise, a complete review of systems is negative.  Physical Exam General: NAD, sitting in chair Cardiovascular: regular rate and rhythm Pulmonary: clear ant fields Abdomen: soft, nontender, + bowel sounds,  Extremities: no edema Skin: no rashes Neurological: Weakness but otherwise nonfocal  IMPRESSION: Patient returns to the clinic today for posthospitalization follow-up.  She was discharged from the hospital yesterday.  Patient says her abdominal pain is improved but she has burning pain radiating across her mid back.  No lesions or rash noted to area.  No spinal tenderness noted.  Case discussed with Dr. Grayland Ormond and MRI offered to the patient but she adamantly declined due to claustrophobia.  Upon further characterization, patient feels  that the back pain is not new but has been present.  Discussed various strategies for management of pain.  Patient is currently on fentanyl 50 mcg every 72 hours.  She is taking 30 mg of oxycodone every 3 to 4 hours at home.  In the hospital, patient's pain was managed with frequent as needed use of IV hydromorphone.  Pain is worse at night.   Given patient's description of burning pain, it seems likely that there is a neuropathic component.  Will give trial of gabapentin at bedtime.  Patient also thinks she might have a UTI and is having some urinary urgency.   PLAN: 1.  Continue transdermal fentanyl 50 mcg every 72 hours 2.  Continue oxycodone 30 mg (250 mg tablets) every 3 to 4 hours as needed for BTP 3.  Prophylactic bowel regimen 4.  Start gabapentin 300 mg nightly 5.  We will check UA with reflex to culture 6.  RTC in 2 weeks or sooner if needed   Patient expressed understanding and was in agreement with this plan. She also understands that She can call clinic at any time with any questions, concerns, or complaints.    Time Total: 20 minutes  Visit consisted of counseling and education dealing with the complex and emotionally intense issues of symptom management and palliative care in the setting of serious and potentially life-threatening illness.Greater than 50%  of this time was spent counseling and coordinating care related to the above assessment and plan.  Signed by: Altha Harm, [place], NP-C, [place] (Work Cell)

## 2018-04-09 NOTE — Progress Notes (Signed)
Patient is here today to follow up on her pancreatic adenocarcinoma. Patient stated that she continues to have lower back pain even though she applies the Fentanyl patches, she is not able to tell if it helps with her pain or not.

## 2018-04-10 ENCOUNTER — Telehealth: Payer: Self-pay | Admitting: *Deleted

## 2018-04-10 ENCOUNTER — Other Ambulatory Visit: Payer: Self-pay | Admitting: *Deleted

## 2018-04-10 LAB — URINE CULTURE

## 2018-04-10 NOTE — Progress Notes (Signed)
[place]  Telephone:(336) [phone] Fax:(336) [phone]  ID: Cindy Bowman OB: Jan 23, 1951  MR#: [id number]  MEQ#:[id number]  Patient Care Team: Glendon Axe, MD as PCP - General (Internal Medicine) Clent Jacks, RN as Registered Nurse Lloyd Huger, MD as Medical Oncologist (Medical Oncology)  CHIEF COMPLAINT: Clinical stage Ib pancreatic adenocarcinoma.  INTERVAL HISTORY: Patient returns to clinic today for further evaluation and hospital follow-up.  She was recently admitted to the hospital for intractable abdominal pain which has improved, but still evident.  She continues to have chronic weakness and fatigue.  She has no neurologic complaints.  She denies any recent fevers.  She has a poor appetite and has lost weight in the interim.  She has no chest pain or shortness of breath.  She denies any nausea, vomiting, constipation, or diarrhea.  She has no urinary complaints.  Patient feels generally terrible, but offers no further specific complaints today.    REVIEW OF SYSTEMS:   Review of Systems  Constitutional: Positive for malaise/fatigue and weight loss. Negative for fever.  HENT: Negative.  Negative for congestion and sinus pain.   Respiratory: Negative.  Negative for cough and shortness of breath.   Cardiovascular: Negative for chest pain and leg swelling.  Gastrointestinal: Positive for abdominal pain. Negative for blood in stool, diarrhea, melena, nausea and vomiting.  Genitourinary: Negative.  Negative for dysuria.  Musculoskeletal: Negative.  Negative for back pain.  Skin: Negative.  Negative for rash.  Neurological: Positive for weakness. Negative for dizziness, focal weakness and headaches.  Psychiatric/Behavioral: Negative.  The patient is not nervous/anxious.     As per HPI. Otherwise, a complete review of systems is negative.  PAST MEDICAL HISTORY: Past Medical History:  Diagnosis Date  . Essential hypertension   . Pancreatic cancer  ([place])   . UTI (urinary tract infection)     PAST SURGICAL HISTORY: Past Surgical History:  Procedure Laterality Date  . CESAREAN SECTION    . CHOLECYSTECTOMY    . EUS N/A 10/25/2017   Procedure: FULL UPPER ENDOSCOPIC ULTRASOUND (EUS) RADIAL;  Surgeon: Jola Schmidt, MD;  Location: [facility] ENDOSCOPY;  Service: Endoscopy;  Laterality: N/A;  . OOPHORECTOMY    . PORTA CATH INSERTION N/A 11/12/2017   Procedure: PORTA CATH INSERTION;  Surgeon: Algernon Huxley, MD;  Location: [facility];  Service: Cardiovascular;  Laterality: N/A;  . TUBAL LIGATION      FAMILY HISTORY: Family History  Problem Relation Age of Onset  . Hypertension Mother        deceased 45  . Heart attack Father        deceased late 61s    ADVANCED DIRECTIVES (Y/N):  N  HEALTH MAINTENANCE: Social History   Tobacco Use  . Smoking status: Never Smoker  . Smokeless tobacco: Never Used  Substance Use Topics  . Alcohol use: Not Currently  . Drug use: Never     Colonoscopy:  PAP:  Bone density:  Lipid panel:  Allergies  Allergen Reactions  . Nsaids Other (See Comments)    Decreased GFR  . Ibuprofen     Lowers kidney function  . Penicillins     Yeast infection  Has patient had a PCN reaction causing immediate rash, facial/tongue/throat swelling, SOB or lightheadedness with hypotension: No Has patient had a PCN reaction causing severe rash involving mucus membranes or skin necrosis: No Has patient had a PCN reaction that required hospitalization: No Has patient had a PCN reaction occurring within the last 10  years: No If all of the above answers are "NO", then may proceed with Cephalosporin use.     Current Outpatient Medications  Medication Sig Dispense Refill  . apixaban (ELIQUIS) 5 MG TABS tablet Take 1 tablet (5 mg total) by mouth 2 (two) times daily. 60 tablet 3  . fentaNYL (DURAGESIC) 12 MCG/HR Place 1 patch onto the skin 1 day or 1 dose.    . folic acid (FOLVITE) 1 MG tablet Take by mouth.      Marland Kitchen HYDROmorphone (DILAUDID) 2 MG tablet Take 1 tablet (2 mg total) by mouth every 4 (four) hours as needed for severe pain. 30 tablet 0  . loperamide (IMODIUM) 2 MG capsule Take 2 mg by mouth as needed for diarrhea or loose stools.     Marland Kitchen losartan (COZAAR) 50 MG tablet Take 1 tablet (50 mg total) by mouth daily. 30 tablet 0  . NARCAN 4 MG/0.1ML LIQD nasal spray kit     . ondansetron (ZOFRAN ODT) 4 MG disintegrating tablet Take 1 tablet (4 mg total) by mouth every 8 (eight) hours as needed for nausea or vomiting. 20 tablet 0  . oxyCODONE (ROXICODONE) 15 MG immediate release tablet Take 1 - 2 tabs (15 - 30 mg) every 3 hours as needed for pain 60 tablet 0  . prochlorperazine (COMPAZINE) 10 MG tablet Take by mouth.    . gabapentin (NEURONTIN) 300 MG capsule Take 1 capsule (300 mg total) by mouth at bedtime. 30 capsule 0  . megestrol (MEGACE) 40 MG tablet Take 1 tablet (40 mg total) by mouth daily. 30 tablet 2   No current facility-administered medications for this visit.    Facility-Administered Medications Ordered in Other Visits  Medication Dose Route Frequency Provider Last Rate Last Dose  . prochlorperazine (COMPAZINE) injection 10 mg  10 mg Intravenous Once Rexene Agent        OBJECTIVE: Vitals:   04/09/18 0932  BP: (!) 144/77  Pulse: 96  Temp: 97.9 F (36.6 C)     Body mass index is 22.85 kg/m.    ECOG FS:0 - Asymptomatic  General: Thin, no acute distress.  Sitting in a wheelchair. Eyes: Pink conjunctiva, anicteric sclera. HEENT: Normocephalic, moist mucous membranes, clear oropharnyx. Lungs: Clear to auscultation bilaterally. Heart: Regular rate and rhythm. No rubs, murmurs, or gallops. Abdomen: Soft, nontender, nondistended. No organomegaly noted, normoactive bowel sounds. Musculoskeletal: No edema, cyanosis, or clubbing. Neuro: Alert, answering all questions appropriately. Cranial nerves grossly intact. Skin: No rashes or petechiae noted. Psych: Normal affect.  LAB  RESULTS:  Lab Results  Component Value Date   NA 140 04/08/2018   K 3.0 (L) 04/08/2018   CL 107 04/08/2018   CO2 25 04/08/2018   GLUCOSE 98 04/08/2018   BUN <5 (L) 04/08/2018   CREATININE 0.93 04/08/2018   CALCIUM 8.2 (L) 04/08/2018   PROT 6.9 04/04/2018   ALBUMIN 2.6 (L) 04/04/2018   AST 27 04/04/2018   ALT 14 04/04/2018   ALKPHOS 78 04/04/2018   BILITOT 0.9 04/04/2018   GFRNONAA >60 04/08/2018   GFRAA >60 04/08/2018    Lab Results  Component Value Date   WBC 12.1 (H) 04/08/2018   NEUTROABS 15.3 (H) 04/04/2018   HGB 8.6 (L) 04/08/2018   HCT 26.2 (L) 04/08/2018   MCV 100.4 (H) 04/08/2018   PLT 277 04/08/2018     STUDIES: Ct Abdomen Pelvis W Contrast  Result Date: 04/04/2018 CLINICAL DATA:  Abdominal pain, history of pancreatic carcinoma and elevated white blood cell  count EXAM: CT ABDOMEN AND PELVIS WITH CONTRAST TECHNIQUE: Multidetector CT imaging of the abdomen and pelvis was performed using the standard protocol following bolus administration of intravenous contrast. CONTRAST:  33m OMNIPAQUE IOHEXOL 300 MG/ML  SOLN COMPARISON:  03/10/2018 FINDINGS: Lower chest: Left-sided pleural effusion is stable. Bibasilar mild atelectatic changes are again noted. Hepatobiliary: The liver is diffusely decreased in attenuation consistent with fatty infiltration. The gallbladder has been surgically removed. No focal mass lesion in the liver is seen. Pancreas: There is a focal hypodense mass lesion with peripheral enhancement identified within the uncinate process of the pancreas consistent with the patient's given clinical history of pancreatic carcinoma. Adjacent fiducial markers are seen. The lesion abuts the superior mesenteric artery consistent with neoplasm and vascular encasement. Occupying the majority of the body of the pancreas there is a an ovoid fluid collection which measures 9.8 x 5.0 cm. This is consistent with a pancreatic pseudocyst related to the necrotizing pancreatitis seen  on the prior exam. Peripheral enhancement is noted. The tail of the pancreas appears within normal limits. A few smaller peripancreatic fluid collections are identified consistent with pseudocysts. The largest of these smaller fluid collections is seen on image number 17 of series 2 in the splenic hilum measuring 3.5 cm in greatest dimension. Spleen: Normal in size without focal abnormality. Adrenals/Urinary Tract: Adrenal glands are within normal limits. Kidneys are well visualized bilaterally. An enhancing mass lesion arising from the left kidney is again noted and stable. This did not demonstrate hypermetabolic nature on prior PET-CT. It again measures approximately 3.1 cm in dimension. No obstructive changes are seen. The bladder is decompressed. The known periurethral diverticulum on the right is less well appreciated due to the lack of contrast opacification. Stomach/Bowel: Diverticular change of the colon is noted without evidence of diverticulitis. The appendix is within normal limits. No obstructive change in the small bowel is identified. Vascular/Lymphatic: No significant vascular findings are present. No enlarged abdominal or pelvic lymph nodes. Reproductive: Uterus is within normal limits. No adnexal mass is seen. Other: Free fluid is noted within the pelvis as well as adjacent to the liver and spleen relatively stable from the previous exam. Fat containing periumbilical hernia is again seen and stable. Musculoskeletal: Degenerative changes of lumbar spine are noted. No definitive lytic or sclerotic lesions to suggest bony metastatic disease are seen. No compression deformities are noted. 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 Smaller pseudocysts are noted adjacent to the tail of the pancreas and spleen. Peripherally enhancing uncinate process mass consistent with  the known history of pancreatic carcinoma. Stable ascites within the abdomen and pelvis. Stable left renal mass. Electronically Signed   By: MInez CatalinaM.D.   On: 04/04/2018 23:20   Dg Abd 2 Views  Result Date: 04/04/2018 CLINICAL DATA:  Mid abdominal pain. EXAM: ABDOMEN - 2 VIEW COMPARISON:  Body CT 03/10/2018 FINDINGS: The bowel gas pattern is normal. There is no evidence of free air. No radio-opaque calculi or other significant radiographic abnormality is seen. Postsurgical changes in the right upper quadrant and mid abdomen. IMPRESSION: Nonobstructive bowel gas pattern.  No free gas seen. Electronically Signed   By: DFidela SalisburyM.D.   On: 04/04/2018 15:52    ASSESSMENT: Clinical stage Ib pancreatic adenocarcinoma.  PLAN:    1.  Clinical stage Ib pancreatic adenocarcinoma: EUS completed on October 25, 2017 confirmed adenocarcinoma.  Patient's CA 19-9 on January 09, 2018 reported at 51. Neoadjuvant FOLFIRINOX was too toxic for patient, therefore she was switched to single agent gemcitabine. Repeat CT scan on April 04, 2018 reviewed independently and reported as above with persistent malignancy along with pseudocyst from recent bout of pancreatitis.  No further chemotherapy is planned at this time, patient will likely require additional treatment in the future.  She has an appointment later today to initiate XRT.  Return to clinic in 1 week for further evaluation and laboratory work.   2.  Anemia: Hemoglobin decreased, but stable at 8.6.  Monitor.   3.  Pain: Continue fentanyl patch and oxycodone as prescribed.  Patient was also given a prescription for gemcitabine today.  Appreciate palliative care input. 4.  Renal mass: CT scan results as above.  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 No intervention is needed at this time.  Continue to monitor while patient is undergoing treatment for her pancreatic cancer.  Patient was previously given a referral to urology.   4.  Genetic:  Genetic testing is pending at time of dictation. 5.  Chronic renal insufficiency: Resolved. 6.  Hypokalemia: Potassium slightly improved to 3.0 today.  Continue to monitor. 7.  Diarrhea: Patient does not complain of this today.  Continue Lomotil as needed. 8.  Poor appetite/weight loss: Patient was given a prescription for Megace today.  Previously she declined dietary referral. 9.  Left lower extremity DVT: Confirmed by ultrasound.  Continue Eliquis as prescribed.   Patient expressed understanding and was in agreement with this plan. She also understands that She can call clinic at any time with any questions, concerns, or complaints.   Cancer Staging Pancreatic adenocarcinoma [facility]) Staging form: Exocrine Pancreas, AJCC 8th Edition - Clinical stage from 10/26/2017: Stage IB (cT2, cN0, cM0) - Signed by Lloyd Huger, MD on 10/26/2017   Lloyd Huger, MD   04/10/2018 9:38 AM

## 2018-04-10 NOTE — Telephone Encounter (Signed)
Call received from patients son regarding advice and follow up after clinic visit yesterday. Patients son questioning how patient should take her pain medication, states she was prescribed dilaudid, oxycodone, fentanyl and gabapentin. I followed up with Billey Chang, NP, patient to take either dilaudid or oxycodone in combination with fentanyl and gabapentin. Patient can use the short acting pain medication that is the most effective. Patients son also requesting feeding tube, Josh does not recommend feeding tube placement at this time but does want patient to follow up with Joli at next clinic visit or prior to if desired by patient and family. Scheduling message will be sent to add patient to dietician schedule. Patients son also requesting home health, will send referral to [facility] today for PT, OT and nursing.

## 2018-04-10 NOTE — Telephone Encounter (Signed)
Husband called requesting refill for fentanyl patch. Medication ordered pending MD approval.

## 2018-04-11 ENCOUNTER — Other Ambulatory Visit: Payer: Self-pay | Admitting: *Deleted

## 2018-04-11 ENCOUNTER — Ambulatory Visit
Admission: RE | Admit: 2018-04-11 | Discharge: 2018-04-11 | Disposition: A | Discharge status: Home / Self Care | Payer: Medicare Other | Source: Ambulatory Visit | Attending: Radiation Oncology | Admitting: Radiation Oncology

## 2018-04-11 DIAGNOSIS — Z510 Encounter for antineoplastic radiation therapy: Secondary | ICD-10-CM | POA: Diagnosis not present

## 2018-04-11 MED ORDER — FENTANYL 50 MCG/HR TD PT72: 1.0000 | MEDICATED_PATCH | TRANSDERMAL | 0 refills | Status: DC

## 2018-04-11 MED ORDER — FOLIC ACID 1 MG PO TABS: 1.0000 mg | ORAL_TABLET | Freq: Every day | ORAL | 3 refills | Status: DC

## 2018-04-11 NOTE — Discharge Summary (Signed)
[place] at [place] NAME: Cindy Bowman    MR#:  [id number]  DATE OF BIRTH:  [birth year]  DATE OF ADMISSION:  04/04/2018   ADMITTING PHYSICIAN: Gorden Harms, MD  DATE OF DISCHARGE: 04/08/2018  5:28 PM  PRIMARY CARE PHYSICIAN: Glendon Axe, MD   ADMISSION DIAGNOSIS:  Pancreatic pseudocyst [K86.3] Epigastric pain [R10.13] Generalized abdominal pain [R10.84] Acute pancreatitis, unspecified complication status, unspecified pancreatitis type [K85.90] DISCHARGE DIAGNOSIS:  Active Problems:   Pain, cancer   Pancreatic pseudocyst   Palliative care encounter   Malnutrition of moderate degree  SECONDARY DIAGNOSIS:   Past Medical History:  Diagnosis Date  . Essential hypertension   . Pancreatic cancer ([place])   . UTI (urinary tract infection)    HOSPITAL COURSE:  *Acute on chronic abdominal pain Suspected due to pancreatic pseudocyst and pancreatitis with underlying pancreatic cancer  - pain under control with her chronic pain meds regimen. Tolerating diet and would like to go home  * Chronic adenocarcinoma of the pancreas S/P[place]mo in December with plans for radiation treatments to help shrink cancer with hopes of surgical resection  Follow Dr. Gary Fleet recommendation.  *Acute pancreatitis with pancreatic pseudocyst - now resolved Most likely secondary to pancreatic cancer  *Chronic hypertension controlled  Leukocytosis.  Unclear etiology.  No evidence of infection. Hypokalemia.  Potassium replaced Anemia of chronic disease: stable  DISCHARGE CONDITIONS:  stable CONSULTS OBTAINED:  Treatment Team:  Benjamine Sprague, DO DRUG ALLERGIES:   Allergies  Allergen Reactions  . Nsaids Other (See Comments)    Decreased GFR  . Ibuprofen     Lowers kidney function  . Penicillins     Yeast infection  Has patient had a PCN reaction causing immediate rash, facial/tongue/throat swelling, SOB or lightheadedness with hypotension:  No Has patient had a PCN reaction causing severe rash involving mucus membranes or skin necrosis: No Has patient had a PCN reaction that required hospitalization: No Has patient had a PCN reaction occurring within the last 10 years: No If all of the above answers are "NO", then may proceed with Cephalosporin use.    DISCHARGE MEDICATIONS:   Allergies as of 04/08/2018      Reactions   Nsaids Other (See Comments)   Decreased GFR   Ibuprofen    Lowers kidney function   Penicillins    Yeast infection Has patient had a PCN reaction causing immediate rash, facial/tongue/throat swelling, SOB or lightheadedness with hypotension: No Has patient had a PCN reaction causing severe rash involving mucus membranes or skin necrosis: No Has patient had a PCN reaction that required hospitalization: No Has patient had a PCN reaction occurring within the last 10 years: No If all of the above answers are "NO", then may proceed with Cephalosporin use.      Medication List    STOP taking these medications   fentaNYL 50 MCG/HR Commonly known as:  DURAGESIC     TAKE these medications   apixaban 5 MG Tabs tablet Commonly known as:  ELIQUIS Take 1 tablet (5 mg total) by mouth 2 (two) times daily.   HYDROmorphone 2 MG tablet Commonly known as:  DILAUDID Take 1 tablet (2 mg total) by mouth every 4 (four) hours as needed for severe pain.   loperamide 2 MG capsule Commonly known as:  IMODIUM Take 2 mg by mouth as needed for diarrhea or loose stools.   losartan 50 MG tablet Commonly known as:  COZAAR Take 1 tablet (50 mg  total) by mouth daily.   NARCAN 4 MG/0.1ML Liqd nasal spray kit Generic drug:  naloxone   ondansetron 4 MG disintegrating tablet Commonly known as:  ZOFRAN ODT Take 1 tablet (4 mg total) by mouth every 8 (eight) hours as needed for nausea or vomiting.   oxyCODONE 15 MG immediate release tablet Commonly known as:  ROXICODONE Take 1 - 2 tabs (15 - 30 mg) every 3 hours as needed  for pain   prochlorperazine 10 MG tablet Commonly known as:  COMPAZINE Take by mouth.        DISCHARGE INSTRUCTIONS:   DIET:  Low fat, Low cholesterol diet DISCHARGE CONDITION:  Stable ACTIVITY:  Activity as tolerated OXYGEN:  Home Oxygen: No.  Oxygen Delivery: room air DISCHARGE LOCATION:  home   If you experience worsening of your admission symptoms, develop shortness of breath, life threatening emergency, suicidal or homicidal thoughts you must seek medical attention immediately by calling 911 or calling your MD immediately  if symptoms less severe.  You Must read complete instructions/literature along with all the possible adverse reactions/side effects for all the Medicines you take and that have been prescribed to you. Take any new Medicines after you have completely understood and accpet all the possible adverse reactions/side effects.   Please note  You were cared for by a hospitalist during your hospital stay. If you have any questions about your discharge medications or the care you received while you were in the hospital after you are discharged, you can call the unit and asked to speak with the hospitalist on call if the hospitalist that took care of you is not available. Once you are discharged, your primary care physician will handle any further medical issues. Please note that NO REFILLS for any discharge medications will be authorized once you are discharged, as it is imperative that you return to your primary care physician (or establish a relationship with a primary care physician if you do not have one) for your aftercare needs so that they can reassess your need for medications and monitor your lab values.    On the day of Discharge:  VITAL SIGNS:  Blood pressure (!) 147/62, pulse 86, temperature 98.6 F (37 C), temperature source Oral, resp. rate 16, height '5\' 3"'  (1.6 m), weight 60.3 kg, SpO2 95 %. PHYSICAL EXAMINATION:  GENERAL:  68 y.o.-year-old patient  lying in the bed with no acute distress.  EYES: Pupils equal, round, reactive to light and accommodation. No scleral icterus. Extraocular muscles intact.  HEENT: Head atraumatic, normocephalic. Oropharynx and nasopharynx clear.  NECK:  Supple, no jugular venous distention. No thyroid enlargement, no tenderness.  LUNGS: Normal breath sounds bilaterally, no wheezing, rales,rhonchi or crepitation. No use of accessory muscles of respiration.  CARDIOVASCULAR: S1, S2 normal. No murmurs, rubs, or gallops.  ABDOMEN: Soft, non-tender, non-distended. Bowel sounds present. No organomegaly or mass.  EXTREMITIES: No pedal edema, cyanosis, or clubbing.  NEUROLOGIC: Cranial nerves II through XII are intact. Muscle strength 5/5 in all extremities. Sensation intact. Gait not checked.  PSYCHIATRIC: The patient is alert and oriented x 3.  SKIN: No obvious rash, lesion, or ulcer.  DATA REVIEW:   CBC Recent Labs  Lab 04/08/18 0456  WBC 12.1*  HGB 8.6*  HCT 26.2*  PLT 277    Chemistries  Recent Labs  Lab 04/08/18 0456 04/08/18 1218  NA 140  --   K 2.9* 3.0*  CL 107  --   CO2 25  --   GLUCOSE 98  --  BUN <5*  --   CREATININE 0.93  --   CALCIUM 8.2*  --   MG  --  2.4     Follow-up Information    Glendon Axe, MD. Go on 04/10/2018.   Specialty:  Internal Medicine Why:  '@3' :30 PM Contact information: [place] [address] [phone]        Lloyd Huger, MD. Go on 04/23/2018.   Specialty:  Oncology Why:  '@9' :00 AM  Contact information: [place] [address] [phone]            Management plans discussed with the patient, family and they are in agreement.  CODE STATUS: Prior   TOTAL TIME TAKING CARE OF THIS PATIENT: 45 minutes.    Max Sane M.D on 04/11/2018 at 7:16 PM  Between 7am to 6pm - Pager - [phone]  After 6pm go to [email or web address] - Proofreader  [facility] [place]  [facility]  Office  [phone]  CC: Primary care physician; Glendon Axe, MD   Note: This dictation was prepared with Dragon dictation along with smaller phrase technology. Any transcriptional errors that result from this process are unintentional.

## 2018-04-12 ENCOUNTER — Other Ambulatory Visit: Payer: Self-pay | Admitting: Oncology

## 2018-04-12 ENCOUNTER — Other Ambulatory Visit: Payer: Self-pay | Admitting: *Deleted

## 2018-04-12 MED ORDER — FENTANYL 50 MCG/HR TD PT72: 1.0000 | MEDICATED_PATCH | TRANSDERMAL | 0 refills | Status: DC

## 2018-04-12 NOTE — Telephone Encounter (Signed)
Patient should only take one short-acting opioid at a time. She can try the hydromorphone prn and discontinue the oxycodone. [facility] for [facility]. Thanks.

## 2018-04-12 NOTE — Telephone Encounter (Signed)
Mr wheless informed of NP response and is in agreement with plan

## 2018-04-12 NOTE — Telephone Encounter (Signed)
[facility] mcg in [facility] and [facility] [address]. Also questioning of she is to take Oxycodone and Hydromorphone or just the Hydromorphone and stop the Oxycodone, she is still not comfortable at this time. Please advise

## 2018-04-14 NOTE — Progress Notes (Signed)
[place]  [phone] [phone]) [phone]  ID: Cindy Bowman OB: Dec 04, 1950  MR#: [id number]  JHE#:[id number]  Patient Care Team: Glendon Axe, MD as PCP - General (Internal Medicine) Clent Jacks, RN as Registered Nurse Lloyd Huger, MD as Medical Oncologist (Medical Oncology)  CHIEF COMPLAINT: Clinical stage Ib pancreatic adenocarcinoma.  INTERVAL HISTORY: Patient returns to clinic today for further evaluation and to assess her toleration of XRT.  She is now completed 3 out of 5 treatments.  She is more lethargic today, but otherwise feels well. She continues to have chronic abdominal pain. She continues to have chronic weakness and fatigue.  She has no neurologic complaints.  She denies any recent fevers.  She continues to have a poor appetite.  She has no chest pain or shortness of breath.  She denies any nausea, vomiting, constipation, or diarrhea.  She has no urinary complaints.  Patient continues to feel generally terrible, but offers no further specific complaints.  REVIEW OF SYSTEMS:   Review of Systems  Constitutional: Positive for malaise/fatigue and weight loss. Negative for fever.  HENT: Negative.  Negative for congestion and sinus pain.   Respiratory: Negative.  Negative for cough and shortness of breath.   Cardiovascular: Negative for chest pain and leg swelling.  Gastrointestinal: Positive for abdominal pain. Negative for blood in stool, diarrhea, melena, nausea and vomiting.  Genitourinary: Negative.  Negative for dysuria.  Musculoskeletal: Negative.  Negative for back pain.  Skin: Negative.  Negative for rash.  Neurological: Positive for weakness. Negative for dizziness, focal weakness and headaches.  Psychiatric/Behavioral: Negative.  The patient is not nervous/anxious.     As per HPI. Otherwise, a complete review of systems is negative.  PAST MEDICAL HISTORY: Past Medical History:  Diagnosis Date  . Essential  hypertension   . Pancreatic cancer ([place])   . UTI (urinary tract infection)     PAST SURGICAL HISTORY: Past Surgical History:  Procedure Laterality Date  . CESAREAN SECTION    . CHOLECYSTECTOMY    . EUS N/A 10/25/2017   Procedure: FULL UPPER ENDOSCOPIC ULTRASOUND (EUS) RADIAL;  Surgeon: Jola Schmidt, MD;  Location: [facility] ENDOSCOPY;  Service: Endoscopy;  Laterality: N/A;  . OOPHORECTOMY    . PORTA CATH INSERTION N/A 11/12/2017   Procedure: PORTA CATH INSERTION;  Surgeon: Algernon Huxley, MD;  Location: [place] [facility];  Service: Cardiovascular;  Laterality: N/A;  . TUBAL LIGATION      FAMILY HISTORY: Family History  Problem Relation Age of Onset  . Hypertension Mother        deceased 27  . Heart attack Father        deceased late 47s    ADVANCED DIRECTIVES (Y/N):  N  HEALTH MAINTENANCE: Social History   Tobacco Use  . Smoking status: Never Smoker  . Smokeless tobacco: Never Used  Substance Use Topics  . Alcohol use: Not Currently  . Drug use: Never     Colonoscopy:  PAP:  Bone density:  Lipid panel:  Allergies  Allergen Reactions  . Nsaids Other (See Comments)    Decreased GFR  . Ibuprofen     Lowers kidney function  . Penicillins     Yeast infection  Has patient had a PCN reaction causing immediate rash, facial/tongue/throat swelling, SOB or lightheadedness with hypotension: No Has patient had a PCN reaction causing severe rash involving mucus membranes or skin necrosis: No Has patient had a PCN reaction that required hospitalization: No Has patient had a PCN  reaction occurring within the last 10 years: No If all of the above answers are "NO", then may proceed with Cephalosporin use.     Current Outpatient Medications  Medication Sig Dispense Refill  . apixaban (ELIQUIS) 5 MG TABS tablet Take 1 tablet (5 mg total) by mouth 2 (two) times daily. 60 tablet 3  . fentaNYL (DURAGESIC) 50 MCG/HR Place 1 patch onto the skin every 3 (three) days. 10 patch 0  .  folic acid (FOLVITE) 1 MG tablet TAKE 1 TABLET BY MOUTH EVERY DAY 90 tablet 1  . gabapentin (NEURONTIN) 300 MG capsule Take 1 capsule (300 mg total) by mouth at bedtime. 30 capsule 0  . loperamide (IMODIUM) 2 MG capsule Take 2 mg by mouth as needed for diarrhea or loose stools.     Marland Kitchen losartan (COZAAR) 50 MG tablet Take 1 tablet (50 mg total) by mouth daily. 30 tablet 0  . megestrol (MEGACE) 40 MG tablet Take 1 tablet (40 mg total) by mouth daily. 30 tablet 2  . NARCAN 4 MG/0.1ML LIQD nasal spray kit     . ondansetron (ZOFRAN ODT) 4 MG disintegrating tablet Take 1 tablet (4 mg total) by mouth every 8 (eight) hours as needed for nausea or vomiting. 20 tablet 0  . prochlorperazine (COMPAZINE) 10 MG tablet Take by mouth.    . oxyCODONE (ROXICODONE) 15 MG immediate release tablet Take 1-2 tablets (15-30 mg total) by mouth every 4 (four) hours as needed for pain. 90 tablet 0   No current facility-administered medications for this visit.    Facility-Administered Medications Ordered in Other Visits  Medication Dose Route Frequency Provider Last Rate Last Dose  . prochlorperazine (COMPAZINE) injection 10 mg  10 mg Intravenous Once Rexene Agent        OBJECTIVE: Vitals:   04/16/18 0958  BP: (!) 142/72  Pulse: 93  Temp: 97.8 F (36.6 C)     Body mass index is 22.83 kg/m.    ECOG FS:0 - Asymptomatic  General: Thin, no acute distress.  Sitting in a wheelchair. Eyes: Pink conjunctiva, anicteric sclera. HEENT: Normocephalic, moist mucous membranes. Lungs: Clear to auscultation bilaterally. Heart: Regular rate and rhythm. No rubs, murmurs, or gallops. Abdomen: Soft, nontender, nondistended. No organomegaly noted, normoactive bowel sounds. Musculoskeletal: No edema, cyanosis, or clubbing. Neuro: Alert, answering all questions appropriately. Cranial nerves grossly intact. Skin: No rashes or petechiae noted. Psych: Normal affect.  LAB RESULTS:  Lab Results  Component Value Date   NA 139  04/16/2018   K 2.7 (LL) 04/16/2018   CL 107 04/16/2018   CO2 24 04/16/2018   GLUCOSE 107 (H) 04/16/2018   BUN 6 (L) 04/16/2018   CREATININE 1.25 (H) 04/16/2018   CALCIUM 8.0 (L) 04/16/2018   PROT 6.9 04/16/2018   ALBUMIN 2.2 (L) 04/16/2018   AST 21 04/16/2018   ALT 13 04/16/2018   ALKPHOS 89 04/16/2018   BILITOT 0.7 04/16/2018   GFRNONAA 44 (L) 04/16/2018   GFRAA 51 (L) 04/16/2018    Lab Results  Component Value Date   WBC 9.6 04/16/2018   NEUTROABS 6.8 04/16/2018   HGB 8.6 (L) 04/16/2018   HCT 26.2 (L) 04/16/2018   MCV 100.0 04/16/2018   PLT 361 04/16/2018     STUDIES: Ct Abdomen Pelvis W Contrast  Result Date: 04/04/2018 CLINICAL DATA:  Abdominal pain, history of pancreatic carcinoma and elevated white blood cell count EXAM: CT ABDOMEN AND PELVIS WITH CONTRAST TECHNIQUE: Multidetector CT imaging of the abdomen and pelvis was  performed using the standard protocol following bolus administration of intravenous contrast. CONTRAST:  12m OMNIPAQUE IOHEXOL 300 MG/ML  SOLN COMPARISON:  03/10/2018 FINDINGS: Lower chest: Left-sided pleural effusion is stable. Bibasilar mild atelectatic changes are again noted. Hepatobiliary: The liver is diffusely decreased in attenuation consistent with fatty infiltration. The gallbladder has been surgically removed. No focal mass lesion in the liver is seen. Pancreas: There is a focal hypodense mass lesion with peripheral enhancement identified within the uncinate process of the pancreas consistent with the patient's given clinical history of pancreatic carcinoma. Adjacent fiducial markers are seen. The lesion abuts the superior mesenteric artery consistent with neoplasm and vascular encasement. Occupying the majority of the body of the pancreas there is a an ovoid fluid collection which measures 9.8 x 5.0 cm. 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 Peripheral enhancement is noted. The  tail of the pancreas appears within normal limits. A few smaller peripancreatic fluid collections are identified consistent with pseudocysts. The largest of these smaller fluid collections is seen on image number 17 of series 2 in the splenic hilum measuring 3.5 cm in greatest dimension. Spleen: Normal in size without focal abnormality. Adrenals/Urinary Tract: Adrenal glands are within normal limits. Kidneys are well visualized bilaterally. An enhancing mass lesion arising from the left kidney is again noted and stable. This did not demonstrate hypermetabolic nature on prior PET-CT. It again measures approximately 3.1 cm in dimension. No obstructive changes are seen. The bladder is decompressed. The known periurethral diverticulum on the right is less well appreciated due to the lack of contrast opacification. Stomach/Bowel: Diverticular change of the colon is noted without evidence of diverticulitis. The appendix is within normal limits. No obstructive change in the small bowel is identified. Vascular/Lymphatic: No significant vascular findings are present. No enlarged abdominal or pelvic lymph nodes. Reproductive: Uterus is within normal limits. No adnexal mass is seen. Other: Free fluid is noted within the pelvis as well as adjacent to the liver and spleen relatively stable from the previous exam. Fat containing periumbilical hernia is again seen and stable. Musculoskeletal: Degenerative changes of lumbar spine are noted. No definitive lytic or sclerotic lesions to suggest bony metastatic disease are seen. No compression deformities are noted. 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 Smaller pseudocysts are noted adjacent to the tail of the pancreas and spleen. Peripherally enhancing uncinate process mass consistent with the known history of pancreatic carcinoma. Stable  ascites within the abdomen and pelvis. Stable left renal mass. Electronically Signed   By: MInez CatalinaM.D.   On: 04/04/2018 23:20   Dg Abd 2 Views  Result Date: 04/04/2018 CLINICAL DATA:  Mid abdominal pain. EXAM: ABDOMEN - 2 VIEW COMPARISON:  Body CT 03/10/2018 FINDINGS: The bowel gas pattern is normal. There is no evidence of free air. No radio-opaque calculi or other significant radiographic abnormality is seen. Postsurgical changes in the right upper quadrant and mid abdomen. IMPRESSION: Nonobstructive bowel gas pattern.  No free gas seen. Electronically Signed   By: DFidela SalisburyM.D.   On: 04/04/2018 15:52    ASSESSMENT: Clinical stage Ib pancreatic adenocarcinoma.  PLAN:    1.  Clinical stage Ib pancreatic adenocarcinoma: EUS completed on October 25, 2017 confirmed adenocarcinoma.   Patient's CA 19-9 on January 09, 2018 reported at 881 Neoadjuvant FOLFIRINOX was too toxic for patient, therefore  she was switched to single agent gemcitabine. Repeat CT scan on April 04, 2018 reviewed independently with persistent malignancy along with pseudocyst from recent bout of pancreatitis.  No further chemotherapy is planned at this time, but patient will likely require additional treatment in the future.  She has now completed 3 of 5 treatments with XRT.  Patient has an appointment with surgery in approximately 2 weeks.  Return to clinic in 1 week for laboratory work and IV fluids and then in 3 weeks for further evaluation and additional treatment planning.     2.  Anemia: Hemoglobin decreased, but stable at 8.6.  Monitor.   3.  Pain: Continue fentanyl patch and oxycodone as prescribed. Appreciate palliative care input. 4.  Renal mass: CT scan results as above.  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 No intervention is needed at this time.  Continue to monitor while patient is undergoing treatment for her pancreatic cancer.  Patient was previously given a referral to urology.   4.   Genetic: Genetic testing is pending at time of dictation. 5.  Chronic renal insufficiency: Patient's creatinine slightly elevated today at 1.25.  Monitor.   6.  Hypokalemia: Potassium is trending down is now 2.7.  Proceed with 2 L IV fluids and 40 mEq IV potassium. 7.  Diarrhea: Patient does not complain of this today.  Continue Lomotil as needed. 8.  Poor appetite/weight loss: Continue Megace as prescribed.  Previously she declined dietary referral. 9.  Left lower extremity DVT: Confirmed by ultrasound.  Continue Eliquis as prescribed.   Patient expressed understanding and was in agreement with this plan. She also understands that She can call clinic at any time with any questions, concerns, or complaints.   Cancer Staging Pancreatic adenocarcinoma [facility]) Staging form: Exocrine Pancreas, AJCC 8th Edition - Clinical stage from 10/26/2017: Stage IB (cT2, cN0, cM0) - Signed by Lloyd Huger, MD on 10/26/2017   Lloyd Huger, MD   04/18/2018 6:40 AM

## 2018-04-15 ENCOUNTER — Telehealth: Payer: Self-pay

## 2018-04-15 ENCOUNTER — Ambulatory Visit
Admission: RE | Admit: 2018-04-15 | Discharge: 2018-04-15 | Disposition: A | Discharge status: Home / Self Care | Payer: Medicare Other | Source: Ambulatory Visit | Attending: Radiation Oncology | Admitting: Radiation Oncology

## 2018-04-15 ENCOUNTER — Other Ambulatory Visit: Payer: Self-pay | Admitting: *Deleted

## 2018-04-15 DIAGNOSIS — Z510 Encounter for antineoplastic radiation therapy: Secondary | ICD-10-CM | POA: Diagnosis not present

## 2018-04-15 DIAGNOSIS — C259 Malignant neoplasm of pancreas, unspecified: Secondary | ICD-10-CM

## 2018-04-15 NOTE — Telephone Encounter (Signed)
EMMI Follow-up: Noted on the report that the patient had some unfilled Rx's.  There were no new Rx's listed for this admission.  I called Cindy Bowman and left a message my contact information for her to call me at her convenience.

## 2018-04-16 ENCOUNTER — Inpatient Hospital Stay: Payer: Medicare Other

## 2018-04-16 ENCOUNTER — Inpatient Hospital Stay (HOSPITAL_BASED_OUTPATIENT_CLINIC_OR_DEPARTMENT_OTHER): Payer: Medicare Other | Admitting: Hospice and Palliative Medicine

## 2018-04-16 ENCOUNTER — Inpatient Hospital Stay (HOSPITAL_BASED_OUTPATIENT_CLINIC_OR_DEPARTMENT_OTHER): Payer: Medicare Other | Admitting: Oncology

## 2018-04-16 ENCOUNTER — Other Ambulatory Visit: Payer: Self-pay | Admitting: *Deleted

## 2018-04-16 ENCOUNTER — Other Ambulatory Visit: Payer: Self-pay

## 2018-04-16 VITALS — BP 142/72 | HR 93 | Temp 97.8°F | Ht 63.0 in | Wt 128.9 lb

## 2018-04-16 DIAGNOSIS — R109 Unspecified abdominal pain: Secondary | ICD-10-CM

## 2018-04-16 DIAGNOSIS — N189 Chronic kidney disease, unspecified: Secondary | ICD-10-CM

## 2018-04-16 DIAGNOSIS — E860 Dehydration: Secondary | ICD-10-CM | POA: Diagnosis not present

## 2018-04-16 DIAGNOSIS — N2889 Other specified disorders of kidney and ureter: Secondary | ICD-10-CM | POA: Diagnosis not present

## 2018-04-16 DIAGNOSIS — R52 Pain, unspecified: Secondary | ICD-10-CM | POA: Diagnosis not present

## 2018-04-16 DIAGNOSIS — G8929 Other chronic pain: Secondary | ICD-10-CM

## 2018-04-16 DIAGNOSIS — E876 Hypokalemia: Secondary | ICD-10-CM

## 2018-04-16 DIAGNOSIS — G893 Neoplasm related pain (acute) (chronic): Secondary | ICD-10-CM

## 2018-04-16 DIAGNOSIS — Z515 Encounter for palliative care: Secondary | ICD-10-CM | POA: Diagnosis not present

## 2018-04-16 DIAGNOSIS — M545 Low back pain: Secondary | ICD-10-CM

## 2018-04-16 DIAGNOSIS — D649 Anemia, unspecified: Secondary | ICD-10-CM | POA: Diagnosis not present

## 2018-04-16 DIAGNOSIS — I824Z2 Acute embolism and thrombosis of unspecified deep veins of left distal lower extremity: Secondary | ICD-10-CM

## 2018-04-16 DIAGNOSIS — R531 Weakness: Secondary | ICD-10-CM

## 2018-04-16 DIAGNOSIS — C259 Malignant neoplasm of pancreas, unspecified: Secondary | ICD-10-CM | POA: Diagnosis not present

## 2018-04-16 DIAGNOSIS — R630 Anorexia: Secondary | ICD-10-CM

## 2018-04-16 DIAGNOSIS — R634 Abnormal weight loss: Secondary | ICD-10-CM

## 2018-04-16 DIAGNOSIS — R5382 Chronic fatigue, unspecified: Secondary | ICD-10-CM

## 2018-04-16 LAB — COMPREHENSIVE METABOLIC PANEL
ALT: 13 U/L (ref 0–44)
ANION GAP: 8 (ref 5–15)
AST: 21 U/L (ref 15–41)
Albumin: 2.2 g/dL — ABNORMAL LOW (ref 3.5–5.0)
Alkaline Phosphatase: 89 U/L (ref 38–126)
BUN: 6 mg/dL — ABNORMAL LOW (ref 8–23)
CO2: 24 mmol/L (ref 22–32)
Calcium: 8 mg/dL — ABNORMAL LOW (ref 8.9–10.3)
Chloride: 107 mmol/L (ref 98–111)
Creatinine, Ser: 1.25 mg/dL — ABNORMAL HIGH (ref 0.44–1.00)
GFR calc Af Amer: 51 mL/min — ABNORMAL LOW (ref >60)
GFR calc non Af Amer: 44 mL/min — ABNORMAL LOW (ref >60)
Glucose, Bld: 107 mg/dL — ABNORMAL HIGH (ref 70–99)
Potassium: 2.7 mmol/L — CL (ref 3.5–5.1)
Sodium: 139 mmol/L (ref 135–145)
Total Bilirubin: 0.7 mg/dL (ref 0.3–1.2)
Total Protein: 6.9 g/dL (ref 6.5–8.1)

## 2018-04-16 LAB — CBC WITH DIFFERENTIAL/PLATELET
Abs Immature Granulocytes: 0.04 10*3/uL (ref 0.00–0.07)
Basophils Absolute: 0 10*3/uL (ref 0.0–0.1)
Basophils Relative: 0 %
Eosinophils Absolute: 0.2 10*3/uL (ref 0.0–0.5)
Eosinophils Relative: 2 %
HCT: 26.2 % — ABNORMAL LOW (ref 36.0–46.0)
Hemoglobin: 8.6 g/dL — ABNORMAL LOW (ref 12.0–15.0)
Immature Granulocytes: 0 %
Lymphocytes Relative: 19 %
Lymphs Abs: 1.8 10*3/uL (ref 0.7–4.0)
MCH: 32.8 pg (ref 26.0–34.0)
MCHC: 32.8 g/dL (ref 30.0–36.0)
MCV: 100 fL (ref 80.0–100.0)
Monocytes Absolute: 0.7 10*3/uL (ref 0.1–1.0)
Monocytes Relative: 7 %
Neutro Abs: 6.8 10*3/uL (ref 1.7–7.7)
Neutrophils Relative %: 72 %
Platelets: 361 10*3/uL (ref 150–400)
RBC: 2.62 MIL/uL — ABNORMAL LOW (ref 3.87–5.11)
RDW: 16.3 % — ABNORMAL HIGH (ref 11.5–15.5)
WBC: 9.6 10*3/uL (ref 4.0–10.5)
nRBC: 0 % (ref 0.0–0.2)

## 2018-04-16 LAB — AMYLASE: Amylase: 148 U/L — ABNORMAL HIGH (ref 28–100)

## 2018-04-16 MED ORDER — SODIUM CHLORIDE 0.9 % IV SOLN
Freq: Once | INTRAVENOUS | Status: AC
  Administered 2018-04-16: 12:00:00 via INTRAVENOUS
  Filled 2018-04-16: qty 1000

## 2018-04-16 MED ORDER — HEPARIN SOD (PORK) LOCK FLUSH 100 UNIT/ML IV SOLN
500.0000 [IU] | Freq: Once | INTRAVENOUS | Status: AC
  Administered 2018-04-16: 500 [IU] via INTRAVENOUS

## 2018-04-16 MED ORDER — OXYCODONE HCL 10 MG PO TABS: 10.0000 mg | ORAL_TABLET | ORAL | 0 refills | Status: DC | PRN

## 2018-04-16 MED ORDER — SODIUM CHLORIDE 0.9 % IV SOLN
Freq: Once | INTRAVENOUS | Status: AC
  Administered 2018-04-16: 11:00:00 via INTRAVENOUS
  Filled 2018-04-16: qty 1000

## 2018-04-16 MED ORDER — SODIUM CHLORIDE 0.9% FLUSH
10.0000 mL | INTRAVENOUS | Status: DC | PRN
  Administered 2018-04-16: 10 mL via INTRAVENOUS
  Filled 2018-04-16: qty 10

## 2018-04-16 NOTE — Progress Notes (Signed)
Patient is here today to follow up on her Pancreatic adenocarcinoma. Patient stated that she continues to have pain on her lower back. Patient is requesting a refill on her Oxycodone.

## 2018-04-16 NOTE — Progress Notes (Signed)
[place]  Telephone:([phone] [phone]   Name: Cindy Bowman Date: 04/16/2018 MRN: [id number]  DOB: 19-Feb-1951  Patient Care Team: Glendon Axe, MD as PCP - General (Internal Medicine) Clent Jacks, RN as Registered Nurse Lloyd Huger, MD as Medical Oncologist (Medical Oncology)    REASON FOR CONSULTATION: Palliative Care consult requested for this67 y.o.femalewith multiple medical problems including stage Ib adenocarcinoma of the pancreas (diagnosed 09/2017) currently receiving neoadjuvant FOLFIRINOX plan for stereotactic XRT prior to resection. Patient developed necrotizing pancreatitis after fiduciary placement procedure at [facility] which was subsequently managed conservatively. She was hospitalized 04/05/18 to 04/09/18 with intractable abdominal pain. CT scan revealed large pancreatic pseudocyst without evidence of further necrosis. She was  Managed conservatively. Patient has been referred to palliative care to help with symptom management and address treatment goals.  SOCIAL HISTORY:    Patient is divorced.  She has one son with whom she lives.  Patient moved here from [place].  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  ADVANCE DIRECTIVES:  Patient says her son is her healthcare power of attorney.  Patient says she has a living will.  CODE STATUS: DNR (MOST completed on 12/01/18)  PAST MEDICAL HISTORY: Past Medical History:  Diagnosis Date  . Essential hypertension   . Pancreatic cancer ([place])   . UTI (urinary tract infection)     PAST SURGICAL HISTORY:  Past Surgical History:  Procedure Laterality Date  . CESAREAN SECTION    . CHOLECYSTECTOMY    . EUS N/A 10/25/2017   Procedure: FULL UPPER ENDOSCOPIC ULTRASOUND (EUS) RADIAL;  Surgeon: Jola Schmidt, MD;  Location: [facility] ENDOSCOPY;  Service: Endoscopy;  Laterality: N/A;  . OOPHORECTOMY    . PORTA CATH INSERTION N/A 11/12/2017   Procedure: PORTA CATH INSERTION;   Surgeon: Algernon Huxley, MD;  Location: [facility];  Service: Cardiovascular;  Laterality: N/A;  . TUBAL LIGATION      HEMATOLOGY/ONCOLOGY HISTORY:    Pancreatic adenocarcinoma ([place])   10/18/2017 Initial Diagnosis    Pancreatic adenocarcinoma ([place])    10/26/2017 Cancer Staging    Staging form: Exocrine Pancreas, AJCC 8th Edition - Clinical stage from 10/26/2017: Stage IB (cT2, cN0, cM0) - Signed by Lloyd Huger, MD on 10/26/2017    11/06/2017 - 12/11/2017 Chemotherapy    The patient had palonosetron (ALOXI) injection 0.25 mg, 0.25 mg, Intravenous,  Once, 2 of 4 cycles Administration: 0.25 mg (11/14/2017), 0.25 mg (11/28/2017) irinotecan (CAMPTOSAR) 340 mg in dextrose 5 % 500 mL chemo infusion, 180 mg/m2 = 340 mg, Intravenous,  Once, 2 of 4 cycles Dose modification: 160 mg/m2 (original dose 180 mg/m2, Cycle 2, Reason: Dose not tolerated) Administration: 340 mg (11/14/2017), 300 mg (11/28/2017) leucovorin 750 mg in dextrose 5 % 250 mL infusion, 760 mg, Intravenous,  Once, 2 of 4 cycles Dose modification: 360 mg/m2 (original dose 400 mg/m2, Cycle 2, Reason: Dose not tolerated) Administration: 750 mg (11/14/2017), 700 mg (11/28/2017) oxaliplatin (ELOXATIN) 150 mg in dextrose 5 % 500 mL chemo infusion, 160 mg, Intravenous,  Once, 2 of 4 cycles Dose modification: 70 mg/m2 (original dose 85 mg/m2, Cycle 2, Reason: Dose not tolerated) Administration: 150 mg (11/14/2017), 135 mg (11/28/2017) fluorouracil (ADRUCIL) chemo injection 750 mg, 400 mg/m2 = 750 mg, Intravenous,  Once, 2 of 4 cycles Dose modification: 360 mg/m2 (original dose 400 mg/m2, Cycle 2, Reason: Dose not tolerated) Administration: 750 mg (11/14/2017), 700 mg (11/28/2017) fluorouracil (ADRUCIL) 4,550 mg in sodium chloride 0.9 %  59 mL chemo infusion, 2,400 mg/m2 = 4,550 mg, Intravenous, 1 Day/Dose, 2 of 4 cycles Dose modification: 2,000 mg/m2 (original dose 2,400 mg/m2, Cycle 2, Reason: Dose not tolerated) Administration: 4,550 mg  (11/14/2017), 3,800 mg (11/28/2017)  for chemotherapy treatment.     12/26/2017 -  Chemotherapy    The patient had gemcitabine (GEMZAR) 1,800 mg in sodium chloride 0.9 % 250 mL chemo infusion, 1,786 mg, Intravenous,  Once, 3 of 4 cycles Administration: 1,800 mg (12/26/2017), 1,800 mg (01/02/2018), 1,800 mg (01/09/2018), 1,800 mg (01/23/2018), 1,800 mg (01/30/2018)  for chemotherapy treatment.      ALLERGIES:  is allergic to nsaids; ibuprofen; and penicillins.  MEDICATIONS:  Current Outpatient Medications  Medication Sig Dispense Refill  . apixaban (ELIQUIS) 5 MG TABS tablet Take 1 tablet (5 mg total) by mouth 2 (two) times daily. 60 tablet 3  . fentaNYL (DURAGESIC) 50 MCG/HR Place 1 patch onto the skin every 3 (three) days. 10 patch 0  . folic acid (FOLVITE) 1 MG tablet TAKE 1 TABLET BY MOUTH EVERY DAY 90 tablet 1  . gabapentin (NEURONTIN) 300 MG capsule Take 1 capsule (300 mg total) by mouth at bedtime. 30 capsule 0  . loperamide (IMODIUM) 2 MG capsule Take 2 mg by mouth as needed for diarrhea or loose stools.     Marland Kitchen losartan (COZAAR) 50 MG tablet Take 1 tablet (50 mg total) by mouth daily. 30 tablet 0  . megestrol (MEGACE) 40 MG tablet Take 1 tablet (40 mg total) by mouth daily. 30 tablet 2  . NARCAN 4 MG/0.1ML LIQD nasal spray kit     . ondansetron (ZOFRAN ODT) 4 MG disintegrating tablet Take 1 tablet (4 mg total) by mouth every 8 (eight) hours as needed for nausea or vomiting. 20 tablet 0  . Oxycodone HCl 10 MG TABS Take 1 tablet (10 mg total) by mouth every 4 (four) hours as needed. 60 tablet 0  . prochlorperazine (COMPAZINE) 10 MG tablet Take by mouth.     No current facility-administered medications for this visit.    Facility-Administered Medications Ordered in Other Visits  Medication Dose Route Frequency Provider Last Rate Last Dose  . heparin lock flush 100 unit/mL  500 Units Intravenous Once Lloyd Huger, MD      . prochlorperazine (COMPAZINE) injection 10 mg  10 mg  Intravenous Once Rexene Agent      . sodium chloride 0.9 % 1,000 mL with potassium chloride 20 mEq infusion   Intravenous Once Lloyd Huger, MD 999 mL/hr at 04/16/18 1133    . sodium chloride 0.9 % 1,000 mL with potassium chloride 20 mEq infusion   Intravenous Once Lloyd Huger, MD      . sodium chloride flush (NS) 0.9 % injection 10 mL  10 mL Intravenous PRN Lloyd Huger, MD   10 mL at 04/16/18 1000    VITAL SIGNS: There were no vitals taken for this visit. There were no vitals filed for this visit.  Estimated body mass index is 22.83 kg/m as calculated from the following:   Height as of an earlier encounter on 04/16/18: '5\' 3"'  (1.6 m).   Weight as of an earlier encounter on 04/16/18: 128 lb 14.4 oz (58.5 kg).  LABS: CBC:    Component Value Date/Time   WBC 9.6 04/16/2018 0938   HGB 8.6 (L) 04/16/2018 0938   HCT 26.2 (L) 04/16/2018 0938   PLT 361 04/16/2018 0938   MCV 100.0 04/16/2018 0938   NEUTROABS 6.8  04/16/2018 0938   LYMPHSABS 1.8 04/16/2018 0938   MONOABS 0.7 04/16/2018 0938   EOSABS 0.2 04/16/2018 0938   BASOSABS 0.0 04/16/2018 0938   Comprehensive Metabolic Panel:    Component Value Date/Time   NA 139 04/16/2018 0938   K 2.7 (LL) 04/16/2018 0938   CL 107 04/16/2018 0938   CO2 24 04/16/2018 0938   BUN 6 (L) 04/16/2018 0938   CREATININE 1.25 (H) 04/16/2018 0938   GLUCOSE 107 (H) 04/16/2018 0938   CALCIUM 8.0 (L) 04/16/2018 0938   AST 21 04/16/2018 0938   ALT 13 04/16/2018 0938   ALKPHOS 89 04/16/2018 0938   BILITOT 0.7 04/16/2018 0938   PROT 6.9 04/16/2018 0938   ALBUMIN 2.2 (L) 04/16/2018 0938    RADIOGRAPHIC STUDIES: Ct Abdomen Pelvis W Contrast  Result Date: 04/04/2018 CLINICAL DATA:  Abdominal pain, history of pancreatic carcinoma and elevated white blood cell count EXAM: CT ABDOMEN AND PELVIS WITH CONTRAST TECHNIQUE: Multidetector CT imaging of the abdomen and pelvis was performed using the standard protocol following bolus  administration of intravenous contrast. CONTRAST:  35m OMNIPAQUE IOHEXOL 300 MG/ML  SOLN COMPARISON:  03/10/2018 FINDINGS: Lower chest: Left-sided pleural effusion is stable. Bibasilar mild atelectatic changes are again noted. Hepatobiliary: The liver is diffusely decreased in attenuation consistent with fatty infiltration. The gallbladder has been surgically removed. No focal mass lesion in the liver is seen. Pancreas: There is a focal hypodense mass lesion with peripheral enhancement identified within the uncinate process of the pancreas consistent with the patient's given clinical history of pancreatic carcinoma. Adjacent fiducial markers are seen. The lesion abuts the superior mesenteric artery consistent with neoplasm and vascular encasement. Occupying the majority of the body of the pancreas there is a an ovoid fluid collection which measures 9.8 x 5.0 cm. 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 Peripheral enhancement is noted. The tail of the pancreas appears within normal limits. A few smaller peripancreatic fluid collections are identified consistent with pseudocysts. The largest of these smaller fluid collections is seen on image number 17 of series 2 in the splenic hilum measuring 3.5 cm in greatest dimension. Spleen: Normal in size without focal abnormality. Adrenals/Urinary Tract: Adrenal glands are within normal limits. Kidneys are well visualized bilaterally. An enhancing mass lesion arising from the left kidney is again noted and stable. This did not demonstrate hypermetabolic nature on prior PET-CT. It again measures approximately 3.1 cm in dimension. No obstructive changes are seen. The bladder is decompressed. The known periurethral diverticulum on the right is less well appreciated due to the lack of contrast opacification. Stomach/Bowel: Diverticular change of the colon is noted without evidence of diverticulitis. The  appendix is within normal limits. No obstructive change in the small bowel is identified. Vascular/Lymphatic: No significant vascular findings are present. No enlarged abdominal or pelvic lymph nodes. Reproductive: Uterus is within normal limits. No adnexal mass is seen. Other: Free fluid is noted within the pelvis as well as adjacent to the liver and spleen relatively stable from the previous exam. Fat containing periumbilical hernia is again seen and stable. Musculoskeletal: Degenerative changes of lumbar spine are noted. No definitive lytic or sclerotic lesions to suggest bony metastatic disease are seen. No compression deformities are noted. 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 Smaller pseudocysts are noted adjacent to the tail of the pancreas  and spleen. Peripherally enhancing uncinate process mass consistent with the known history of pancreatic carcinoma. Stable ascites within the abdomen and pelvis. Stable left renal mass. Electronically Signed   By: Inez Catalina M.D.   On: 04/04/2018 23:20   Dg Abd 2 Views  Result Date: 04/04/2018 CLINICAL DATA:  Mid abdominal pain. EXAM: ABDOMEN - 2 VIEW COMPARISON:  Body CT 03/10/2018 FINDINGS: The bowel gas pattern is normal. There is no evidence of free air. No radio-opaque calculi or other significant radiographic abnormality is seen. Postsurgical changes in the right upper quadrant and mid abdomen. IMPRESSION: Nonobstructive bowel gas pattern.  No free gas seen. Electronically Signed   By: Fidela Salisbury M.D.   On: 04/04/2018 15:52    PERFORMANCE STATUS (ECOG) : 1 - Symptomatic but completely ambulatory  Review of Systems As noted above. Otherwise, a complete review of systems is negative.  Physical Exam General: NAD, sitting in chair Cardiovascular: regular rate and rhythm Pulmonary: clear ant  fields Abdomen: soft, nontender, + bowel sounds,  Extremities: no edema Skin: no rashes Neurological: Weakness but otherwise nonfocal  IMPRESSION: Patient returns to the clinic today for routine follow up regarding her pain. Patient was seen in the infusion area.   Patient says she is currently comfortable without pain. She continues to receive transdermal fentanyl 71mg Q72H and oxycodone 365mQ4H PRN. Patient says pain is stable on this regimen. She denies constipation or other adverse effects from medications.   We discussed her poor oral intake. Weight down to 128lbs from 133lbs earlier this month. Encouraged increased oral supplements (currently only drinking one Ensure per day) and focusing on smaller portions with high calories/protein. I offered referral to RD but patient declined.   PLAN: 1.  Continue transdermal fentanyl 50 mcg every 72 hours 2.  Continue oxycodone 30 mg every 3 to 4 hours as needed for BTP 3.  Prophylactic bowel regimen 4.  Increase oral supplements to TID 5.  RTC in 2 weeks or sooner if needed   Patient expressed understanding and was in agreement with this plan. She also understands that She can call clinic at any time with any questions, concerns, or complaints.    Time Total: 20 minutes  Visit consisted of counseling and education dealing with the complex and emotionally intense issues of symptom management and palliative care in the setting of serious and potentially life-threatening illness.Greater than 50%  of this time was spent counseling and coordinating care related to the above assessment and plan.  Signed by: JoAltha HarmDNNorth ArlingtonNP-C, ACCheneyvilleWork Cell)

## 2018-04-17 ENCOUNTER — Telehealth: Payer: Self-pay | Admitting: *Deleted

## 2018-04-17 ENCOUNTER — Other Ambulatory Visit: Payer: Self-pay | Admitting: Hospice and Palliative Medicine

## 2018-04-17 ENCOUNTER — Ambulatory Visit
Admission: RE | Admit: 2018-04-17 | Discharge: 2018-04-17 | Disposition: A | Discharge status: Home / Self Care | Payer: Medicare Other | Source: Ambulatory Visit | Attending: Radiation Oncology | Admitting: Radiation Oncology

## 2018-04-17 DIAGNOSIS — Z510 Encounter for antineoplastic radiation therapy: Secondary | ICD-10-CM | POA: Diagnosis not present

## 2018-04-17 MED ORDER — OXYCODONE HCL 15 MG PO TABS: 15.0000 mg | ORAL_TABLET | ORAL | 0 refills | Status: DC | PRN

## 2018-04-17 NOTE — Telephone Encounter (Signed)
Likely an oversite. Please send 15mg  tabs.

## 2018-04-17 NOTE — Telephone Encounter (Signed)
Prescription sent

## 2018-04-17 NOTE — Telephone Encounter (Signed)
Patient got refill of her Oxycodone and it was for 10 mg and she has been on 15 mg dose. Asking if there is a reason for this and that we send prescription for 15 mg tabs in. Please advise

## 2018-04-17 NOTE — Progress Notes (Signed)
[place] CSRS reviewed.   Oxycodone refilled - 15mg  tablets (take 1-2 tablets) Q4H PRN, #90.

## 2018-04-17 NOTE — Telephone Encounter (Signed)
[facility] informed of new prescription sent

## 2018-04-19 ENCOUNTER — Ambulatory Visit
Admission: RE | Admit: 2018-04-19 | Discharge: 2018-04-19 | Disposition: A | Discharge status: Home / Self Care | Payer: Medicare Other | Source: Ambulatory Visit | Attending: Radiation Oncology | Admitting: Radiation Oncology

## 2018-04-19 ENCOUNTER — Encounter: Payer: Self-pay | Admitting: Radiation Oncology

## 2018-04-19 DIAGNOSIS — Z510 Encounter for antineoplastic radiation therapy: Secondary | ICD-10-CM | POA: Diagnosis not present

## 2018-04-22 ENCOUNTER — Ambulatory Visit: Payer: Medicare Other | Admitting: Oncology

## 2018-04-23 ENCOUNTER — Inpatient Hospital Stay: Payer: Medicare Other

## 2018-04-23 ENCOUNTER — Ambulatory Visit: Payer: Medicare Other | Admitting: Oncology

## 2018-04-23 ENCOUNTER — Inpatient Hospital Stay: Payer: Medicare Other | Attending: Hospice and Palliative Medicine | Admitting: Hospice and Palliative Medicine

## 2018-04-23 DIAGNOSIS — I824Z2 Acute embolism and thrombosis of unspecified deep veins of left distal lower extremity: Secondary | ICD-10-CM | POA: Insufficient documentation

## 2018-04-23 DIAGNOSIS — D649 Anemia, unspecified: Secondary | ICD-10-CM | POA: Diagnosis not present

## 2018-04-23 DIAGNOSIS — N189 Chronic kidney disease, unspecified: Secondary | ICD-10-CM | POA: Diagnosis not present

## 2018-04-23 DIAGNOSIS — C250 Malignant neoplasm of head of pancreas: Secondary | ICD-10-CM | POA: Diagnosis not present

## 2018-04-23 DIAGNOSIS — N2889 Other specified disorders of kidney and ureter: Secondary | ICD-10-CM | POA: Insufficient documentation

## 2018-04-23 DIAGNOSIS — R52 Pain, unspecified: Secondary | ICD-10-CM

## 2018-04-23 DIAGNOSIS — R634 Abnormal weight loss: Secondary | ICD-10-CM | POA: Insufficient documentation

## 2018-04-23 DIAGNOSIS — M545 Low back pain: Secondary | ICD-10-CM | POA: Insufficient documentation

## 2018-04-23 DIAGNOSIS — Z515 Encounter for palliative care: Secondary | ICD-10-CM | POA: Diagnosis not present

## 2018-04-23 DIAGNOSIS — C259 Malignant neoplasm of pancreas, unspecified: Secondary | ICD-10-CM

## 2018-04-23 DIAGNOSIS — R630 Anorexia: Secondary | ICD-10-CM | POA: Diagnosis not present

## 2018-04-23 DIAGNOSIS — R109 Unspecified abdominal pain: Secondary | ICD-10-CM | POA: Diagnosis not present

## 2018-04-23 DIAGNOSIS — G893 Neoplasm related pain (acute) (chronic): Secondary | ICD-10-CM

## 2018-04-23 LAB — CBC WITH DIFFERENTIAL/PLATELET
Abs Immature Granulocytes: 0.03 10*3/uL (ref 0.00–0.07)
Basophils Absolute: 0 10*3/uL (ref 0.0–0.1)
Basophils Relative: 0 %
EOS PCT: 3 %
Eosinophils Absolute: 0.2 10*3/uL (ref 0.0–0.5)
HCT: 26.3 % — ABNORMAL LOW (ref 36.0–46.0)
Hemoglobin: 8.5 g/dL — ABNORMAL LOW (ref 12.0–15.0)
Immature Granulocytes: 0 %
Lymphocytes Relative: 34 %
Lymphs Abs: 2.6 10*3/uL (ref 0.7–4.0)
MCH: 32.4 pg (ref 26.0–34.0)
MCHC: 32.3 g/dL (ref 30.0–36.0)
MCV: 100.4 fL — AB (ref 80.0–100.0)
Monocytes Absolute: 0.8 10*3/uL (ref 0.1–1.0)
Monocytes Relative: 10 %
Neutro Abs: 4.1 10*3/uL (ref 1.7–7.7)
Neutrophils Relative %: 53 %
Platelets: 307 10*3/uL (ref 150–400)
RBC: 2.62 MIL/uL — ABNORMAL LOW (ref 3.87–5.11)
RDW: 15.9 % — ABNORMAL HIGH (ref 11.5–15.5)
WBC: 7.7 10*3/uL (ref 4.0–10.5)
nRBC: 0 % (ref 0.0–0.2)

## 2018-04-23 LAB — COMPREHENSIVE METABOLIC PANEL
ALT: 12 U/L (ref 0–44)
AST: 26 U/L (ref 15–41)
Albumin: 2.4 g/dL — ABNORMAL LOW (ref 3.5–5.0)
Alkaline Phosphatase: 88 U/L (ref 38–126)
Anion gap: 12 (ref 5–15)
BUN: 6 mg/dL — ABNORMAL LOW (ref 8–23)
CO2: 21 mmol/L — ABNORMAL LOW (ref 22–32)
Calcium: 8.1 mg/dL — ABNORMAL LOW (ref 8.9–10.3)
Chloride: 105 mmol/L (ref 98–111)
Creatinine, Ser: 0.94 mg/dL (ref 0.44–1.00)
GFR calc Af Amer: >60 mL/min (ref >60)
Glucose, Bld: 87 mg/dL (ref 70–99)
Potassium: 3.3 mmol/L — ABNORMAL LOW (ref 3.5–5.1)
Sodium: 138 mmol/L (ref 135–145)
Total Bilirubin: 0.9 mg/dL (ref 0.3–1.2)
Total Protein: 7.1 g/dL (ref 6.5–8.1)

## 2018-04-23 MED ORDER — ONDANSETRON HCL 4 MG/2ML IJ SOLN
8.0000 mg | Freq: Once | INTRAMUSCULAR | Status: AC
  Administered 2018-04-23: 8 mg via INTRAVENOUS
  Filled 2018-04-23: qty 4

## 2018-04-23 MED ORDER — HEPARIN SOD (PORK) LOCK FLUSH 100 UNIT/ML IV SOLN
500.0000 [IU] | Freq: Once | INTRAVENOUS | Status: AC
  Administered 2018-04-23: 500 [IU] via INTRAVENOUS
  Filled 2018-04-23: qty 5

## 2018-04-23 MED ORDER — DEXAMETHASONE SODIUM PHOSPHATE 10 MG/ML IJ SOLN
10.0000 mg | Freq: Once | INTRAMUSCULAR | Status: AC
  Administered 2018-04-23: 10 mg via INTRAVENOUS
  Filled 2018-04-23: qty 1

## 2018-04-23 MED ORDER — MORPHINE SULFATE 2 MG/ML IJ SOLN
2.0000 mg | Freq: Once | INTRAMUSCULAR | Status: AC
  Administered 2018-04-23: 2 mg via INTRAVENOUS
  Filled 2018-04-23: qty 1

## 2018-04-23 MED ORDER — SODIUM CHLORIDE 0.9 % IV SOLN: Freq: Once | INTRAVENOUS | Status: DC

## 2018-04-23 MED ORDER — SODIUM CHLORIDE 0.9 % IV SOLN
Freq: Once | INTRAVENOUS | Status: AC
  Administered 2018-04-23: 11:00:00 via INTRAVENOUS
  Filled 2018-04-23: qty 1000

## 2018-04-23 MED ORDER — SODIUM CHLORIDE 0.9% FLUSH
10.0000 mL | INTRAVENOUS | Status: DC | PRN
  Administered 2018-04-23: 10 mL via INTRAVENOUS
  Filled 2018-04-23: qty 10

## 2018-04-23 NOTE — Progress Notes (Signed)
[place]  [phone] [phone]   Name: Cindy Bowman Date: 04/23/2018 MRN: [id number]  DOB: February 21, 1950  Patient Care Team: Glendon Axe, MD as PCP - General (Internal Medicine) Clent Jacks, RN as Registered Nurse Lloyd Huger, MD as Medical Oncologist (Medical Oncology)    REASON FOR CONSULTATION: Palliative Care consult requested for this67 y.o.femalewith multiple medical problems including stage Ib adenocarcinoma of the pancreas (diagnosed 09/2017) currently receiving neoadjuvant FOLFIRINOX plan for stereotactic XRT prior to resection. Patient developed necrotizing pancreatitis after fiduciary placement procedure at [facility] which was subsequently managed conservatively. She was hospitalized 04/05/18 to 04/09/18 with intractable abdominal pain. CT scan revealed large pancreatic pseudocyst without evidence of further necrosis. She was  Managed conservatively. Patient has been referred to palliative care to help with symptom management and address treatment goals.  SOCIAL HISTORY:    Patient is divorced.  She has one son with whom she lives.  Patient moved here from [place].  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  ADVANCE DIRECTIVES:  Patient says her son is her healthcare power of attorney.  Patient says she has a living will.  CODE STATUS: DNR (MOST completed on 12/01/18)  PAST MEDICAL HISTORY: Past Medical History:  Diagnosis Date  . Essential hypertension   . Pancreatic cancer ([place])   . UTI (urinary tract infection)     PAST SURGICAL HISTORY:  Past Surgical History:  Procedure Laterality Date  . CESAREAN SECTION    . CHOLECYSTECTOMY    . EUS N/A 10/25/2017   Procedure: FULL UPPER ENDOSCOPIC ULTRASOUND (EUS) RADIAL;  Surgeon: Jola Schmidt, MD;  Location: [facility] ENDOSCOPY;  Service: Endoscopy;  Laterality: N/A;  . OOPHORECTOMY    . PORTA CATH INSERTION N/A 11/12/2017   Procedure: PORTA CATH INSERTION;   Surgeon: Algernon Huxley, MD;  Location: [place] [facility];  Service: Cardiovascular;  Laterality: N/A;  . TUBAL LIGATION      HEMATOLOGY/ONCOLOGY HISTORY:    Pancreatic adenocarcinoma ([place])   10/18/2017 Initial Diagnosis    Pancreatic adenocarcinoma ([place])    10/26/2017 Cancer Staging    Staging form: Exocrine Pancreas, AJCC 8th Edition - Clinical stage from 10/26/2017: Stage IB (cT2, cN0, cM0) - Signed by Lloyd Huger, MD on 10/26/2017    11/06/2017 - 12/11/2017 Chemotherapy    The patient had palonosetron (ALOXI) injection 0.25 mg, 0.25 mg, Intravenous,  Once, 2 of 4 cycles Administration: 0.25 mg (11/14/2017), 0.25 mg (11/28/2017) irinotecan (CAMPTOSAR) 340 mg in dextrose 5 % 500 mL chemo infusion, 180 mg/m2 = 340 mg, Intravenous,  Once, 2 of 4 cycles Dose modification: 160 mg/m2 (original dose 180 mg/m2, Cycle 2, Reason: Dose not tolerated) Administration: 340 mg (11/14/2017), 300 mg (11/28/2017) leucovorin 750 mg in dextrose 5 % 250 mL infusion, 760 mg, Intravenous,  Once, 2 of 4 cycles Dose modification: 360 mg/m2 (original dose 400 mg/m2, Cycle 2, Reason: Dose not tolerated) Administration: 750 mg (11/14/2017), 700 mg (11/28/2017) oxaliplatin (ELOXATIN) 150 mg in dextrose 5 % 500 mL chemo infusion, 160 mg, Intravenous,  Once, 2 of 4 cycles Dose modification: 70 mg/m2 (original dose 85 mg/m2, Cycle 2, Reason: Dose not tolerated) Administration: 150 mg (11/14/2017), 135 mg (11/28/2017) fluorouracil (ADRUCIL) chemo injection 750 mg, 400 mg/m2 = 750 mg, Intravenous,  Once, 2 of 4 cycles Dose modification: 360 mg/m2 (original dose 400 mg/m2, Cycle 2, Reason: Dose not tolerated) Administration: 750 mg (11/14/2017), 700 mg (11/28/2017) fluorouracil (ADRUCIL) 4,550 mg in sodium chloride 0.9 %  59 mL chemo infusion, 2,400 mg/m2 = 4,550 mg, Intravenous, 1 Day/Dose, 2 of 4 cycles Dose modification: 2,000 mg/m2 (original dose 2,400 mg/m2, Cycle 2, Reason: Dose not tolerated) Administration: 4,550 mg  (11/14/2017), 3,800 mg (11/28/2017)  for chemotherapy treatment.     12/26/2017 -  Chemotherapy    The patient had gemcitabine (GEMZAR) 1,800 mg in sodium chloride 0.9 % 250 mL chemo infusion, 1,786 mg, Intravenous,  Once, 3 of 4 cycles Administration: 1,800 mg (12/26/2017), 1,800 mg (01/02/2018), 1,800 mg (01/09/2018), 1,800 mg (01/23/2018), 1,800 mg (01/30/2018)  for chemotherapy treatment.      ALLERGIES:  is allergic to nsaids; ibuprofen; and penicillins.  MEDICATIONS:  Current Outpatient Medications  Medication Sig Dispense Refill  . apixaban (ELIQUIS) 5 MG TABS tablet Take 1 tablet (5 mg total) by mouth 2 (two) times daily. 60 tablet 3  . fentaNYL (DURAGESIC) 50 MCG/HR Place 1 patch onto the skin every 3 (three) days. 10 patch 0  . folic acid (FOLVITE) 1 MG tablet TAKE 1 TABLET BY MOUTH EVERY DAY 90 tablet 1  . gabapentin (NEURONTIN) 300 MG capsule Take 1 capsule (300 mg total) by mouth at bedtime. 30 capsule 0  . loperamide (IMODIUM) 2 MG capsule Take 2 mg by mouth as needed for diarrhea or loose stools.     Marland Kitchen losartan (COZAAR) 50 MG tablet Take 1 tablet (50 mg total) by mouth daily. 30 tablet 0  . megestrol (MEGACE) 40 MG tablet Take 1 tablet (40 mg total) by mouth daily. 30 tablet 2  . NARCAN 4 MG/0.1ML LIQD nasal spray kit     . ondansetron (ZOFRAN ODT) 4 MG disintegrating tablet Take 1 tablet (4 mg total) by mouth every 8 (eight) hours as needed for nausea or vomiting. 20 tablet 0  . oxyCODONE (ROXICODONE) 15 MG immediate release tablet Take 1-2 tablets (15-30 mg total) by mouth every 4 (four) hours as needed for pain. 90 tablet 0  . prochlorperazine (COMPAZINE) 10 MG tablet Take by mouth.     No current facility-administered medications for this visit.    Facility-Administered Medications Ordered in Other Visits  Medication Dose Route Frequency Provider Last Rate Last Dose  . [COMPLETED] heparin lock flush 100 unit/mL  500 Units Intravenous Once Lloyd Huger, MD   500  Units at 04/23/18 1145  . prochlorperazine (COMPAZINE) injection 10 mg  10 mg Intravenous Once Rexene Agent      . sodium chloride 0.9 % 1,000 mL with potassium chloride 20 mEq infusion   Intravenous Once Lloyd Huger, MD 999 mL/hr at 04/23/18 1042    . sodium chloride flush (NS) 0.9 % injection 10 mL  10 mL Intravenous PRN Lloyd Huger, MD   10 mL at 04/23/18 0855    VITAL SIGNS: There were no vitals taken for this visit. There were no vitals filed for this visit.  Estimated body mass index is 22.83 kg/m as calculated from the following:   Height as of 04/16/18: '5\' 3"'  (1.6 m).   Weight as of 04/16/18: 128 lb 14.4 oz (58.5 kg).  LABS: CBC:    Component Value Date/Time   WBC 7.7 04/23/2018 0905   HGB 8.5 (L) 04/23/2018 0905   HCT 26.3 (L) 04/23/2018 0905   PLT 307 04/23/2018 0905   MCV 100.4 (H) 04/23/2018 0905   NEUTROABS 4.1 04/23/2018 0905   LYMPHSABS 2.6 04/23/2018 0905   MONOABS 0.8 04/23/2018 0905   EOSABS 0.2 04/23/2018 0905   BASOSABS 0.0 04/23/2018  0960   Comprehensive Metabolic Panel:    Component Value Date/Time   NA 138 04/23/2018 0905   K 3.3 (L) 04/23/2018 0905   CL 105 04/23/2018 0905   CO2 21 (L) 04/23/2018 0905   BUN 6 (L) 04/23/2018 0905   CREATININE 0.94 04/23/2018 0905   GLUCOSE 87 04/23/2018 0905   CALCIUM 8.1 (L) 04/23/2018 0905   AST 26 04/23/2018 0905   ALT 12 04/23/2018 0905   ALKPHOS 88 04/23/2018 0905   BILITOT 0.9 04/23/2018 0905   PROT 7.1 04/23/2018 0905   ALBUMIN 2.4 (L) 04/23/2018 0905    RADIOGRAPHIC STUDIES: Ct Abdomen Pelvis W Contrast  Result Date: 04/04/2018 CLINICAL DATA:  Abdominal pain, history of pancreatic carcinoma and elevated white blood cell count EXAM: CT ABDOMEN AND PELVIS WITH CONTRAST TECHNIQUE: Multidetector CT imaging of the abdomen and pelvis was performed using the standard protocol following bolus administration of intravenous contrast. CONTRAST:  58m OMNIPAQUE IOHEXOL 300 MG/ML  SOLN COMPARISON:   03/10/2018 FINDINGS: Lower chest: Left-sided pleural effusion is stable. Bibasilar mild atelectatic changes are again noted. Hepatobiliary: The liver is diffusely decreased in attenuation consistent with fatty infiltration. The gallbladder has been surgically removed. No focal mass lesion in the liver is seen. Pancreas: There is a focal hypodense mass lesion with peripheral enhancement identified within the uncinate process of the pancreas consistent with the patient's given clinical history of pancreatic carcinoma. Adjacent fiducial markers are seen. The lesion abuts the superior mesenteric artery consistent with neoplasm and vascular encasement. Occupying the majority of the body of the pancreas there is a an ovoid fluid collection which measures 9.8 x 5.0 cm. 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 Peripheral enhancement is noted. The tail of the pancreas appears within normal limits. A few smaller peripancreatic fluid collections are identified consistent with pseudocysts. The largest of these smaller fluid collections is seen on image number 17 of series 2 in the splenic hilum measuring 3.5 cm in greatest dimension. Spleen: Normal in size without focal abnormality. Adrenals/Urinary Tract: Adrenal glands are within normal limits. Kidneys are well visualized bilaterally. An enhancing mass lesion arising from the left kidney is again noted and stable. This did not demonstrate hypermetabolic nature on prior PET-CT. It again measures approximately 3.1 cm in dimension. No obstructive changes are seen. The bladder is decompressed. The known periurethral diverticulum on the right is less well appreciated due to the lack of contrast opacification. Stomach/Bowel: Diverticular change of the colon is noted without evidence of diverticulitis. The appendix is within normal limits. No obstructive change in the small bowel is identified.  Vascular/Lymphatic: No significant vascular findings are present. No enlarged abdominal or pelvic lymph nodes. Reproductive: Uterus is within normal limits. No adnexal mass is seen. Other: Free fluid is noted within the pelvis as well as adjacent to the liver and spleen relatively stable from the previous exam. Fat containing periumbilical hernia is again seen and stable. Musculoskeletal: Degenerative changes of lumbar spine are noted. No definitive lytic or sclerotic lesions to suggest bony metastatic disease are seen. No compression deformities are noted. 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 Smaller pseudocysts are noted adjacent to the tail of the pancreas and spleen. Peripherally enhancing uncinate process mass consistent with the known history of pancreatic carcinoma. Stable ascites within the abdomen and pelvis. Stable left renal mass.  Electronically Signed   By: Inez Catalina M.D.   On: 04/04/2018 23:20   Dg Abd 2 Views  Result Date: 04/04/2018 CLINICAL DATA:  Mid abdominal pain. EXAM: ABDOMEN - 2 VIEW COMPARISON:  Body CT 03/10/2018 FINDINGS: The bowel gas pattern is normal. There is no evidence of free air. No radio-opaque calculi or other significant radiographic abnormality is seen. Postsurgical changes in the right upper quadrant and mid abdomen. IMPRESSION: Nonobstructive bowel gas pattern.  No free gas seen. Electronically Signed   By: Fidela Salisbury M.D.   On: 04/04/2018 15:52    PERFORMANCE STATUS (ECOG) : 1 - Symptomatic but completely ambulatory  Review of Systems As noted above. Otherwise, a complete review of systems is negative.  Physical Exam General: NAD, sitting in chair Cardiovascular: regular rate and rhythm Pulmonary: clear ant fields Abdomen: soft, nontender, + bowel sounds,  Extremities: no edema Skin: no  rashes Neurological: Weakness but otherwise nonfocal  IMPRESSION: Patient returns to the clinic today for routine follow up regarding her pain. Patient was seen in the infusion area.   Patient says pain has been stable with fentanyl and oxycodone for BTP.  She did have more pain today in the clinic but received a dose of hydromorphone and feels better now.  Patient also had some nausea this morning but says that is relieved after dose of Zofran.  Patient denies other distressing symptoms.  Patient seems to be coping reasonably well.  She has a bright affect.  She wants to know the next steps in regards to her treatment.  Follow-up scheduled with Dr. Grayland Ormond.  PLAN: 1.  Continue transdermal fentanyl 50 mcg every 72 hours 2.  Continue oxycodone 30 mg every 3 to 4 hours as needed for BTP 3.  Prophylactic bowel regimen 4.  Continue oral supplements 5.  RTC in 2 weeks or sooner if needed   Patient expressed understanding and was in agreement with this plan. She also understands that She can call clinic at any time with any questions, concerns, or complaints.    Time Total: 15 minutes  Visit consisted of counseling and education dealing with the complex and emotionally intense issues of symptom management and palliative care in the setting of serious and potentially life-threatening illness.Greater than 50%  of this time was spent counseling and coordinating care related to the above assessment and plan.  Signed by: Altha Harm, [place], NP-C, [place] (Work Cell)

## 2018-04-26 NOTE — Progress Notes (Signed)
[facility] [facility] Simulation and Treatment Planning Note   Name:  Shayonna Ocampo MRN: [id number]   Date: 03/26/18  DOB: November 11, 1950  Status:outpatient    DIAGNOSIS:    ICD-10-CM   1. Malignant neoplasm of head of pancreas ([place]) C25.0      CONSENT VERIFIED:yes   SET UP: Patient is setup supine   IMMOBILIZATION: The patient was immobilized using a Vac Loc bag and customized accuform device  NARRATIVE:The patient was brought to the [place].  Identity was confirmed.  All relevant records and images related to the planned course of therapy were reviewed.  Then, the patient was positioned in a stable reproducible clinical set-up for radiation therapy. Abdominal compression was applied.  4D CT images were obtained and reproducible breathing pattern was confirmed. Free breathing CT images were obtained.  Skin markings were placed.  The CT images were loaded into the planning software where the target and avoidance structures were contoured.  The radiation prescription was entered and confirmed.    TREATMENT PLANNING NOTE:  Treatment planning then occurred. I have requested : MLC's, isodose plan, basic dose calculation.  3 dimensional simulation is performed and dose volume histogram of the gross tumor volume, planning tumor volume and criticial normal structures including the spinal cord and lungs were analyzed and requested.  Special treatment procedure was performed due to high dose per fraction.  The patient will be monitored for increased risk of toxicity.  Daily imaging using cone beam CT will be used for target localization.  I anticipate that the patient will receive 33 Gy in 5 fractions to target volume. Further adjustments will be made based on the planning process is necessary.  ------------------------------------------------  Jodelle Gross, MD, PhD

## 2018-04-28 NOTE — Progress Notes (Signed)
[place]  [phone] Fax:([phone]) [phone]  ID: Cindy Bowman OB: 11-Dec-1950  MR#: [id number]  WSF#:[id number]  Patient Care Team: Glendon Axe, MD as PCP - General (Internal Medicine) Clent Jacks, RN as Registered Nurse Lloyd Huger, MD as Medical Oncologist (Medical Oncology)  CHIEF COMPLAINT: Clinical stage Ib pancreatic adenocarcinoma.  INTERVAL HISTORY: Patient returns to clinic today for repeat laboratory can further evaluation.  She has now completed her XRT.  She continues to have abdominal pain radiating to her back.  She continues to have chronic weakness and fatigue.  She has no neurologic complaints.  She denies any recent fevers.  She continues to have a poor appetite and weight loss.  She has no chest pain or shortness of breath.  She denies any nausea, vomiting, constipation, or diarrhea.  She has no urinary complaints.  Patient offers no further specific complaints today.  REVIEW OF SYSTEMS:   Review of Systems  Constitutional: Positive for malaise/fatigue and weight loss. Negative for fever.  HENT: Negative.  Negative for congestion and sinus pain.   Respiratory: Negative.  Negative for cough and shortness of breath.   Cardiovascular: Negative for chest pain and leg swelling.  Gastrointestinal: Positive for abdominal pain. Negative for blood in stool, diarrhea, melena, nausea and vomiting.  Genitourinary: Negative.  Negative for dysuria.  Musculoskeletal: Negative.  Negative for back pain.  Skin: Negative.  Negative for rash.  Neurological: Positive for weakness. Negative for dizziness, focal weakness and headaches.  Psychiatric/Behavioral: Negative.  The patient is not nervous/anxious.     As per HPI. Otherwise, a complete review of systems is negative.  PAST MEDICAL HISTORY: Past Medical History:  Diagnosis Date  . Essential hypertension   . Pancreatic cancer ([place])   . UTI (urinary tract infection)     PAST SURGICAL  HISTORY: Past Surgical History:  Procedure Laterality Date  . CESAREAN SECTION    . CHOLECYSTECTOMY    . EUS N/A 10/25/2017   Procedure: FULL UPPER ENDOSCOPIC ULTRASOUND (EUS) RADIAL;  Surgeon: Jola Schmidt, MD;  Location: [facility] ENDOSCOPY;  Service: Endoscopy;  Laterality: N/A;  . OOPHORECTOMY    . PORTA CATH INSERTION N/A 11/12/2017   Procedure: PORTA CATH INSERTION;  Surgeon: Algernon Huxley, MD;  Location: [facility];  Service: Cardiovascular;  Laterality: N/A;  . TUBAL LIGATION      FAMILY HISTORY: Family History  Problem Relation Age of Onset  . Hypertension Mother        deceased 65  . Heart attack Father        deceased late 15s    ADVANCED DIRECTIVES (Y/N):  N  HEALTH MAINTENANCE: Social History   Tobacco Use  . Smoking status: Never Smoker  . Smokeless tobacco: Never Used  Substance Use Topics  . Alcohol use: Not Currently  . Drug use: Never     Colonoscopy:  PAP:  Bone density:  Lipid panel:  Allergies  Allergen Reactions  . Nsaids Other (See Comments)    Decreased GFR  . Ibuprofen     Lowers kidney function  . Penicillins     Yeast infection  Has patient had a PCN reaction causing immediate rash, facial/tongue/throat swelling, SOB or lightheadedness with hypotension: No Has patient had a PCN reaction causing severe rash involving mucus membranes or skin necrosis: No Has patient had a PCN reaction that required hospitalization: No Has patient had a PCN reaction occurring within the last 10 years: No If all of the above answers  are "NO", then may proceed with Cephalosporin use.     Current Outpatient Medications  Medication Sig Dispense Refill  . apixaban (ELIQUIS) 5 MG TABS tablet Take 1 tablet (5 mg total) by mouth 2 (two) times daily. 60 tablet 3  . folic acid (FOLVITE) 1 MG tablet TAKE 1 TABLET BY MOUTH EVERY DAY 90 tablet 1  . loperamide (IMODIUM) 2 MG capsule Take 2 mg by mouth as needed for diarrhea or loose stools.     Marland Kitchen losartan  (COZAAR) 50 MG tablet Take 1 tablet (50 mg total) by mouth daily. 30 tablet 0  . megestrol (MEGACE) 40 MG tablet Take 1 tablet (40 mg total) by mouth daily. 30 tablet 2  . NARCAN 4 MG/0.1ML LIQD nasal spray kit     . ondansetron (ZOFRAN ODT) 4 MG disintegrating tablet Take 1 tablet (4 mg total) by mouth every 8 (eight) hours as needed for nausea or vomiting. 20 tablet 0  . oxyCODONE (ROXICODONE) 15 MG immediate release tablet Take 1-2 tablets (15-30 mg total) by mouth every 4 (four) hours as needed for pain. 90 tablet 0  . prochlorperazine (COMPAZINE) 10 MG tablet Take by mouth.    . fentaNYL (DURAGESIC) 75 MCG/HR Place 1 patch onto the skin every 3 (three) days. 5 patch 0  . gabapentin (NEURONTIN) 300 MG capsule Take 1 capsule (300 mg total) by mouth at bedtime. 90 capsule 0  . lidocaine-prilocaine (EMLA) cream Apply 1 application topically as needed. 30 g 2  . potassium chloride (KLOR-CON SPRINKLE) 10 MEQ CR capsule Take 2 capsules (20 mEq total) by mouth daily. May open capsule to sprinkle on food. 30 capsule 0   No current facility-administered medications for this visit.    Facility-Administered Medications Ordered in Other Visits  Medication Dose Route Frequency Provider Last Rate Last Dose  . prochlorperazine (COMPAZINE) injection 10 mg  10 mg Intravenous Once Rexene Agent        OBJECTIVE: Vitals:   04/30/18 1048  BP: (!) 149/82  Pulse: (!) 112  Resp: 18  Temp: (!) 96.9 F (36.1 C)     Body mass index is 21.1 kg/m.    ECOG FS:0 - Asymptomatic  General: Thin, no acute distress.  Sitting in a wheelchair. Eyes: Pink conjunctiva, anicteric sclera. HEENT: Normocephalic, moist mucous membranes, clear oropharnyx. Lungs: Clear to auscultation bilaterally. Heart: Regular rate and rhythm. No rubs, murmurs, or gallops. Abdomen: Soft, nontender, nondistended. No organomegaly noted, normoactive bowel sounds. Musculoskeletal: No edema, cyanosis, or clubbing. Neuro: Alert, answering all  questions appropriately. Cranial nerves grossly intact. Skin: No rashes or petechiae noted. Psych: Normal affect.  LAB RESULTS:  Lab Results  Component Value Date   NA 137 04/30/2018   K 3.0 (L) 04/30/2018   CL 106 04/30/2018   CO2 20 (L) 04/30/2018   GLUCOSE 99 04/30/2018   BUN 9 04/30/2018   CREATININE 1.06 (H) 04/30/2018   CALCIUM 8.4 (L) 04/30/2018   PROT 7.7 04/30/2018   ALBUMIN 2.5 (L) 04/30/2018   AST 25 04/30/2018   ALT 13 04/30/2018   ALKPHOS 97 04/30/2018   BILITOT 1.0 04/30/2018   GFRNONAA 54 (L) 04/30/2018   GFRAA >60 04/30/2018    Lab Results  Component Value Date   WBC 8.9 04/30/2018   NEUTROABS 4.6 04/30/2018   HGB 9.0 (L) 04/30/2018   HCT 27.8 (L) 04/30/2018   MCV 99.3 04/30/2018   PLT 287 04/30/2018     STUDIES: Dg Thoracic Spine 2 View  Result  Date: 05/01/2018 CLINICAL DATA:  Pancreatic cancer.  Back pain. EXAM: THORACIC SPINE 2 VIEWS COMPARISON:  Chest radiography 03/10/2018 FINDINGS: Very minimal spinal curvature. No significant disc space narrowing. Ordinary marginal osteophytes. No evidence of fracture or destructive lesion. Posteromedial ribs appear normal. IMPRESSION: No definitely significant finding. Ordinary age related changes. Ordinary marginal osteophytes. No sign of fracture or destructive lesion. Electronically Signed   By: Nelson Chimes M.D.   On: 05/01/2018 09:11   Ct Abdomen Pelvis W Contrast  Result Date: 04/04/2018 CLINICAL DATA:  Abdominal pain, history of pancreatic carcinoma and elevated white blood cell count EXAM: CT ABDOMEN AND PELVIS WITH CONTRAST TECHNIQUE: Multidetector CT imaging of the abdomen and pelvis was performed using the standard protocol following bolus administration of intravenous contrast. CONTRAST:  5m OMNIPAQUE IOHEXOL 300 MG/ML  SOLN COMPARISON:  03/10/2018 FINDINGS: Lower chest: Left-sided pleural effusion is stable. Bibasilar mild atelectatic changes are again noted. Hepatobiliary: The liver is diffusely  decreased in attenuation consistent with fatty infiltration. The gallbladder has been surgically removed. No focal mass lesion in the liver is seen. Pancreas: There is a focal hypodense mass lesion with peripheral enhancement identified within the uncinate process of the pancreas consistent with the patient's given clinical history of pancreatic carcinoma. Adjacent fiducial markers are seen. The lesion abuts the superior mesenteric artery consistent with neoplasm and vascular encasement. Occupying the majority of the body of the pancreas there is a an ovoid fluid collection which measures 9.8 x 5.0 cm. 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 Peripheral enhancement is noted. The tail of the pancreas appears within normal limits. A few smaller peripancreatic fluid collections are identified consistent with pseudocysts. The largest of these smaller fluid collections is seen on image number 17 of series 2 in the splenic hilum measuring 3.5 cm in greatest dimension. Spleen: Normal in size without focal abnormality. Adrenals/Urinary Tract: Adrenal glands are within normal limits. Kidneys are well visualized bilaterally. An enhancing mass lesion arising from the left kidney is again noted and stable. This did not demonstrate hypermetabolic nature on prior PET-CT. It again measures approximately 3.1 cm in dimension. No obstructive changes are seen. The bladder is decompressed. The known periurethral diverticulum on the right is less well appreciated due to the lack of contrast opacification. Stomach/Bowel: Diverticular change of the colon is noted without evidence of diverticulitis. The appendix is within normal limits. No obstructive change in the small bowel is identified. Vascular/Lymphatic: No significant vascular findings are present. No enlarged abdominal or pelvic lymph nodes. Reproductive: Uterus is within normal limits. No adnexal mass is seen.  Other: Free fluid is noted within the pelvis as well as adjacent to the liver and spleen relatively stable from the previous exam. Fat containing periumbilical hernia is again seen and stable. Musculoskeletal: Degenerative changes of lumbar spine are noted. No definitive lytic or sclerotic lesions to suggest bony metastatic disease are seen. No compression deformities are noted. 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 Smaller pseudocysts are noted adjacent to the tail of the pancreas and spleen. Peripherally enhancing uncinate process mass consistent with the known history of pancreatic carcinoma. Stable ascites within the abdomen and pelvis. Stable left renal mass. Electronically Signed   By: MInez CatalinaM.D.   On: 04/04/2018 23:20   Dg Abd 2 Views  Result Date: 04/04/2018 CLINICAL DATA:  Mid abdominal pain. EXAM: ABDOMEN - 2 VIEW COMPARISON:  Body CT 03/10/2018 FINDINGS: The bowel gas pattern is normal. There is no evidence of free air. No radio-opaque calculi or other significant radiographic abnormality is seen. Postsurgical changes in the right upper quadrant and mid abdomen. IMPRESSION: Nonobstructive bowel gas pattern.  No free gas seen. Electronically Signed   By: Fidela Salisbury M.D.   On: 04/04/2018 15:52    ASSESSMENT: Clinical stage Ib pancreatic adenocarcinoma.  PLAN:    1.  Clinical stage Ib pancreatic adenocarcinoma: EUS completed on October 25, 2017 confirmed adenocarcinoma.   Patient's CA 19-9 on January 09, 2018 reported at 40. Neoadjuvant FOLFIRINOX was too toxic for patient, therefore she was switched to single agent gemcitabine. Repeat CT scan on April 04, 2018 reviewed independently with persistent malignancy along with pseudocyst from recent bout of pancreatitis.  No further chemotherapy is planned at this time, but patient will likely  require additional treatment in the future.  Patient has now completed XRT and has evaluation by surgery later this week.  Return to clinic in 1 week for further evaluation and potential treatment planning.  2.  Anemia: Patient's hemoglobin is decreased, but has trended up slightly to 9.0.  Monitor. 3.  Pain: Increase fentanyl patch to 75 mcg every 3 days.  Continue oxycodone and gabapentin.  Patient was also given referral for interventional pain clinic for consideration of celiac plexus block.  Appreciate palliative care input. 4.  Renal mass: CT scan results as above.  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 No intervention is needed at this time.  Continue to monitor while patient is undergoing treatment for her pancreatic cancer.  Patient was previously given a referral to urology.   4.  Genetic: Genetic testing is pending at time of dictation. 5.  Chronic renal insufficiency: Patient's creatinine is essentially within normal limits at 1.06 today. 6.  Hypokalemia: Potassium is 3.0 today.  Proceed with 2 L of IV fluids and 40 mEq of IV potassium.  Patient was also given a prescription for potassium powder rather than pills today. 7.  Diarrhea: Patient does not complain of this today.  Continue Lomotil as needed. 8.  Poor appetite/weight loss: Continue Megace as prescribed.  Previously she declined dietary referral. 9.  Left lower extremity DVT: Confirmed by ultrasound.  Continue Eliquis as prescribed.   Patient expressed understanding and was in agreement with this plan. She also understands that She can call clinic at any time with any questions, concerns, or complaints.   Cancer Staging Malignant neoplasm of head of pancreas [facility] [place]) Staging form: Exocrine Pancreas, AJCC 8th Edition - Clinical stage from 10/26/2017: Stage IB (cT2, cN0, cM0) - Signed by Lloyd Huger, MD on 10/26/2017   Lloyd Huger, MD   05/03/2018 6:32 AM

## 2018-04-30 ENCOUNTER — Inpatient Hospital Stay: Payer: Medicare Other

## 2018-04-30 ENCOUNTER — Other Ambulatory Visit: Payer: Self-pay

## 2018-04-30 ENCOUNTER — Ambulatory Visit
Admission: RE | Admit: 2018-04-30 | Discharge: 2018-04-30 | Disposition: A | Discharge status: Home / Self Care | Payer: Medicare Other | Source: Ambulatory Visit | Attending: Hospice and Palliative Medicine | Admitting: Hospice and Palliative Medicine

## 2018-04-30 ENCOUNTER — Encounter: Payer: Self-pay | Admitting: Oncology

## 2018-04-30 ENCOUNTER — Inpatient Hospital Stay (HOSPITAL_BASED_OUTPATIENT_CLINIC_OR_DEPARTMENT_OTHER): Payer: Medicare Other | Admitting: Oncology

## 2018-04-30 ENCOUNTER — Inpatient Hospital Stay (HOSPITAL_BASED_OUTPATIENT_CLINIC_OR_DEPARTMENT_OTHER): Payer: Medicare Other | Admitting: Hospice and Palliative Medicine

## 2018-04-30 VITALS — BP 149/82 | HR 112 | Temp 96.9°F | Resp 18 | Wt 119.1 lb

## 2018-04-30 DIAGNOSIS — N2889 Other specified disorders of kidney and ureter: Secondary | ICD-10-CM

## 2018-04-30 DIAGNOSIS — R634 Abnormal weight loss: Secondary | ICD-10-CM

## 2018-04-30 DIAGNOSIS — C250 Malignant neoplasm of head of pancreas: Secondary | ICD-10-CM

## 2018-04-30 DIAGNOSIS — Z515 Encounter for palliative care: Secondary | ICD-10-CM | POA: Diagnosis not present

## 2018-04-30 DIAGNOSIS — I824Z2 Acute embolism and thrombosis of unspecified deep veins of left distal lower extremity: Secondary | ICD-10-CM

## 2018-04-30 DIAGNOSIS — G893 Neoplasm related pain (acute) (chronic): Secondary | ICD-10-CM

## 2018-04-30 DIAGNOSIS — E876 Hypokalemia: Secondary | ICD-10-CM

## 2018-04-30 DIAGNOSIS — C259 Malignant neoplasm of pancreas, unspecified: Secondary | ICD-10-CM

## 2018-04-30 DIAGNOSIS — D649 Anemia, unspecified: Secondary | ICD-10-CM | POA: Diagnosis not present

## 2018-04-30 DIAGNOSIS — N189 Chronic kidney disease, unspecified: Secondary | ICD-10-CM | POA: Diagnosis not present

## 2018-04-30 DIAGNOSIS — M545 Low back pain: Secondary | ICD-10-CM

## 2018-04-30 DIAGNOSIS — R109 Unspecified abdominal pain: Secondary | ICD-10-CM

## 2018-04-30 DIAGNOSIS — Z7901 Long term (current) use of anticoagulants: Secondary | ICD-10-CM

## 2018-04-30 DIAGNOSIS — R531 Weakness: Secondary | ICD-10-CM

## 2018-04-30 DIAGNOSIS — R5382 Chronic fatigue, unspecified: Secondary | ICD-10-CM

## 2018-04-30 DIAGNOSIS — R110 Nausea: Secondary | ICD-10-CM

## 2018-04-30 DIAGNOSIS — R630 Anorexia: Secondary | ICD-10-CM

## 2018-04-30 DIAGNOSIS — Z95828 Presence of other vascular implants and grafts: Secondary | ICD-10-CM

## 2018-04-30 LAB — COMPREHENSIVE METABOLIC PANEL
ALT: 13 U/L (ref 0–44)
AST: 25 U/L (ref 15–41)
Albumin: 2.5 g/dL — ABNORMAL LOW (ref 3.5–5.0)
Alkaline Phosphatase: 97 U/L (ref 38–126)
Anion gap: 11 (ref 5–15)
BILIRUBIN TOTAL: 1 mg/dL (ref 0.3–1.2)
BUN: 9 mg/dL (ref 8–23)
CO2: 20 mmol/L — ABNORMAL LOW (ref 22–32)
Calcium: 8.4 mg/dL — ABNORMAL LOW (ref 8.9–10.3)
Chloride: 106 mmol/L (ref 98–111)
Creatinine, Ser: 1.06 mg/dL — ABNORMAL HIGH (ref 0.44–1.00)
GFR calc Af Amer: >60 mL/min (ref >60)
GFR calc non Af Amer: 54 mL/min — ABNORMAL LOW (ref >60)
Glucose, Bld: 99 mg/dL (ref 70–99)
Potassium: 3 mmol/L — ABNORMAL LOW (ref 3.5–5.1)
Sodium: 137 mmol/L (ref 135–145)
TOTAL PROTEIN: 7.7 g/dL (ref 6.5–8.1)

## 2018-04-30 LAB — CBC WITH DIFFERENTIAL/PLATELET
Abs Immature Granulocytes: 0.04 10*3/uL (ref 0.00–0.07)
Basophils Absolute: 0 10*3/uL (ref 0.0–0.1)
Basophils Relative: 0 %
Eosinophils Absolute: 0.3 10*3/uL (ref 0.0–0.5)
Eosinophils Relative: 3 %
HCT: 27.8 % — ABNORMAL LOW (ref 36.0–46.0)
Hemoglobin: 9 g/dL — ABNORMAL LOW (ref 12.0–15.0)
Immature Granulocytes: 1 %
Lymphocytes Relative: 34 %
Lymphs Abs: 3 10*3/uL (ref 0.7–4.0)
MCH: 32.1 pg (ref 26.0–34.0)
MCHC: 32.4 g/dL (ref 30.0–36.0)
MCV: 99.3 fL (ref 80.0–100.0)
Monocytes Absolute: 1 10*3/uL (ref 0.1–1.0)
Monocytes Relative: 11 %
Neutro Abs: 4.6 10*3/uL (ref 1.7–7.7)
Neutrophils Relative %: 51 %
Platelets: 287 10*3/uL (ref 150–400)
RBC: 2.8 MIL/uL — ABNORMAL LOW (ref 3.87–5.11)
RDW: 15.9 % — ABNORMAL HIGH (ref 11.5–15.5)
WBC: 8.9 10*3/uL (ref 4.0–10.5)
nRBC: 0 % (ref 0.0–0.2)

## 2018-04-30 LAB — AMYLASE: Amylase: 111 U/L — ABNORMAL HIGH (ref 28–100)

## 2018-04-30 MED ORDER — MORPHINE SULFATE 2 MG/ML IJ SOLN
2.0000 mg | Freq: Once | INTRAMUSCULAR | Status: AC
  Administered 2018-04-30: 2 mg via INTRAVENOUS
  Filled 2018-04-30: qty 1

## 2018-04-30 MED ORDER — SODIUM CHLORIDE 0.9 % IV SOLN
Freq: Once | INTRAVENOUS | Status: AC
  Administered 2018-04-30: 12:00:00 via INTRAVENOUS
  Filled 2018-04-30: qty 20

## 2018-04-30 MED ORDER — MORPHINE SULFATE (PF) 2 MG/ML IV SOLN
2.0000 mg | Freq: Once | INTRAVENOUS | Status: AC
  Administered 2018-04-30: 2 mg via INTRAVENOUS
  Filled 2018-04-30: qty 1

## 2018-04-30 MED ORDER — HEPARIN SOD (PORK) LOCK FLUSH 100 UNIT/ML IV SOLN: 500.0000 [IU] | Freq: Once | INTRAVENOUS | Status: DC

## 2018-04-30 MED ORDER — FENTANYL 75 MCG/HR TD PT72: 1.0000 | MEDICATED_PATCH | TRANSDERMAL | 0 refills | Status: DC

## 2018-04-30 MED ORDER — SODIUM CHLORIDE 0.9% FLUSH
10.0000 mL | INTRAVENOUS | Status: DC | PRN
  Administered 2018-04-30 (×4): 10 mL via INTRAVENOUS
  Filled 2018-04-30: qty 10

## 2018-04-30 MED ORDER — HEPARIN SOD (PORK) LOCK FLUSH 100 UNIT/ML IV SOLN
500.0000 [IU] | Freq: Once | INTRAVENOUS | Status: AC
  Administered 2018-04-30: 500 [IU] via INTRAVENOUS

## 2018-04-30 MED ORDER — POTASSIUM CHLORIDE ER 10 MEQ PO CPCR: 20.0000 meq | ORAL_CAPSULE | Freq: Every day | ORAL | 0 refills | Status: DC

## 2018-04-30 MED ORDER — ONDANSETRON HCL 4 MG/2ML IJ SOLN
8.0000 mg | Freq: Once | INTRAMUSCULAR | Status: AC
  Administered 2018-04-30: 8 mg via INTRAVENOUS
  Filled 2018-04-30: qty 4

## 2018-04-30 MED ORDER — HYDROMORPHONE HCL 1 MG/ML IJ SOLN
1.0000 mg | Freq: Once | INTRAMUSCULAR | Status: AC
  Administered 2018-04-30: 1 mg via INTRAVENOUS
  Filled 2018-04-30: qty 1

## 2018-04-30 MED ORDER — SODIUM CHLORIDE 0.9% FLUSH
10.0000 mL | Freq: Once | INTRAVENOUS | Status: AC
  Administered 2018-04-30: 10 mL via INTRAVENOUS
  Filled 2018-04-30: qty 10

## 2018-04-30 MED ORDER — LIDOCAINE-PRILOCAINE 2.5-2.5 % EX CREA: 1.0000 "application " | TOPICAL_CREAM | CUTANEOUS | 2 refills | Status: AC | PRN

## 2018-04-30 MED ORDER — HEPARIN SOD (PORK) LOCK FLUSH 100 UNIT/ML IV SOLN
INTRAVENOUS | Status: AC
  Filled 2018-04-30: qty 5

## 2018-04-30 NOTE — Progress Notes (Signed)
Per Dr Grayland Ormond patient may receive potassium 51meq over 2 hours. Patient has a port

## 2018-04-30 NOTE — Progress Notes (Signed)
[place]  [phone] [phone]   Name: Cindy Bowman Date: 04/30/2018 MRN: [id number]  DOB: 01-Dec-1950  Patient Care Team: Glendon Axe, MD as PCP - General (Internal Medicine) Clent Jacks, RN as Registered Nurse Lloyd Huger, MD as Medical Oncologist (Medical Oncology)    REASON FOR CONSULTATION: Palliative Care consult requested for this67 y.o.femalewith multiple medical problems including stage Ib adenocarcinoma of the pancreas (diagnosed 09/2017) currently receiving neoadjuvant FOLFIRINOX plan for stereotactic XRT prior to resection. Patient developed necrotizing pancreatitis after fiduciary placement procedure at [facility] which was subsequently managed conservatively. She was hospitalized 04/05/18 to 04/09/18 with intractable abdominal pain. CT scan revealed large pancreatic pseudocyst without evidence of further necrosis. She was  Managed conservatively. Patient has been referred to palliative care to help with symptom management and address treatment goals.  SOCIAL HISTORY:    Patient is divorced.  She has one son with whom she lives.  Patient moved here from [place].  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  ADVANCE DIRECTIVES:  Patient says her son is her healthcare power of attorney.  Patient says she has a living will.  CODE STATUS: DNR (MOST completed on 12/01/18)  PAST MEDICAL HISTORY: Past Medical History:  Diagnosis Date  . Essential hypertension   . Pancreatic cancer ([place])   . UTI (urinary tract infection)     PAST SURGICAL HISTORY:  Past Surgical History:  Procedure Laterality Date  . CESAREAN SECTION    . CHOLECYSTECTOMY    . EUS N/A 10/25/2017   Procedure: FULL UPPER ENDOSCOPIC ULTRASOUND (EUS) RADIAL;  Surgeon: Jola Schmidt, MD;  Location: [facility] ENDOSCOPY;  Service: Endoscopy;  Laterality: N/A;  . OOPHORECTOMY    . PORTA CATH INSERTION N/A 11/12/2017   Procedure: PORTA CATH INSERTION;   Surgeon: Algernon Huxley, MD;  Location: [place] [facility];  Service: Cardiovascular;  Laterality: N/A;  . TUBAL LIGATION      HEMATOLOGY/ONCOLOGY HISTORY:    Malignant neoplasm of head of pancreas ([place])   10/18/2017 Initial Diagnosis    Pancreatic adenocarcinoma ([place])    10/26/2017 Cancer Staging    Staging form: Exocrine Pancreas, AJCC 8th Edition - Clinical stage from 10/26/2017: Stage IB (cT2, cN0, cM0) - Signed by Lloyd Huger, MD on 10/26/2017    11/06/2017 - 12/11/2017 Chemotherapy    The patient had palonosetron (ALOXI) injection 0.25 mg, 0.25 mg, Intravenous,  Once, 2 of 4 cycles Administration: 0.25 mg (11/14/2017), 0.25 mg (11/28/2017) irinotecan (CAMPTOSAR) 340 mg in dextrose 5 % 500 mL chemo infusion, 180 mg/m2 = 340 mg, Intravenous,  Once, 2 of 4 cycles Dose modification: 160 mg/m2 (original dose 180 mg/m2, Cycle 2, Reason: Dose not tolerated) Administration: 340 mg (11/14/2017), 300 mg (11/28/2017) leucovorin 750 mg in dextrose 5 % 250 mL infusion, 760 mg, Intravenous,  Once, 2 of 4 cycles Dose modification: 360 mg/m2 (original dose 400 mg/m2, Cycle 2, Reason: Dose not tolerated) Administration: 750 mg (11/14/2017), 700 mg (11/28/2017) oxaliplatin (ELOXATIN) 150 mg in dextrose 5 % 500 mL chemo infusion, 160 mg, Intravenous,  Once, 2 of 4 cycles Dose modification: 70 mg/m2 (original dose 85 mg/m2, Cycle 2, Reason: Dose not tolerated) Administration: 150 mg (11/14/2017), 135 mg (11/28/2017) fluorouracil (ADRUCIL) chemo injection 750 mg, 400 mg/m2 = 750 mg, Intravenous,  Once, 2 of 4 cycles Dose modification: 360 mg/m2 (original dose 400 mg/m2, Cycle 2, Reason: Dose not tolerated) Administration: 750 mg (11/14/2017), 700 mg (11/28/2017) fluorouracil (ADRUCIL) 4,550 mg in  sodium chloride 0.9 % 59 mL chemo infusion, 2,400 mg/m2 = 4,550 mg, Intravenous, 1 Day/Dose, 2 of 4 cycles Dose modification: 2,000 mg/m2 (original dose 2,400 mg/m2, Cycle 2, Reason: Dose not  tolerated) Administration: 4,550 mg (11/14/2017), 3,800 mg (11/28/2017)  for chemotherapy treatment.     12/26/2017 -  Chemotherapy    The patient had gemcitabine (GEMZAR) 1,800 mg in sodium chloride 0.9 % 250 mL chemo infusion, 1,786 mg, Intravenous,  Once, 3 of 4 cycles Administration: 1,800 mg (12/26/2017), 1,800 mg (01/02/2018), 1,800 mg (01/09/2018), 1,800 mg (01/23/2018), 1,800 mg (01/30/2018)  for chemotherapy treatment.      ALLERGIES:  is allergic to nsaids; ibuprofen; and penicillins.  MEDICATIONS:  Current Outpatient Medications  Medication Sig Dispense Refill  . apixaban (ELIQUIS) 5 MG TABS tablet Take 1 tablet (5 mg total) by mouth 2 (two) times daily. 60 tablet 3  . fentaNYL (DURAGESIC) 50 MCG/HR Place 1 patch onto the skin every 3 (three) days. 10 patch 0  . folic acid (FOLVITE) 1 MG tablet TAKE 1 TABLET BY MOUTH EVERY DAY 90 tablet 1  . gabapentin (NEURONTIN) 300 MG capsule Take 1 capsule (300 mg total) by mouth at bedtime. 30 capsule 0  . lidocaine-prilocaine (EMLA) cream Apply 1 application topically as needed. 30 g 2  . loperamide (IMODIUM) 2 MG capsule Take 2 mg by mouth as needed for diarrhea or loose stools.     Marland Kitchen losartan (COZAAR) 50 MG tablet Take 1 tablet (50 mg total) by mouth daily. 30 tablet 0  . megestrol (MEGACE) 40 MG tablet Take 1 tablet (40 mg total) by mouth daily. 30 tablet 2  . NARCAN 4 MG/0.1ML LIQD nasal spray kit     . ondansetron (ZOFRAN ODT) 4 MG disintegrating tablet Take 1 tablet (4 mg total) by mouth every 8 (eight) hours as needed for nausea or vomiting. 20 tablet 0  . oxyCODONE (ROXICODONE) 15 MG immediate release tablet Take 1-2 tablets (15-30 mg total) by mouth every 4 (four) hours as needed for pain. 90 tablet 0  . potassium chloride (KLOR-CON SPRINKLE) 10 MEQ CR capsule Take 2 capsules (20 mEq total) by mouth daily. May open capsule to sprinkle on food. 30 capsule 0  . prochlorperazine (COMPAZINE) 10 MG tablet Take by mouth.     No current  facility-administered medications for this visit.    Facility-Administered Medications Ordered in Other Visits  Medication Dose Route Frequency Provider Last Rate Last Dose  . heparin lock flush 100 unit/mL  500 Units Intravenous Once Lloyd Huger, MD      . prochlorperazine (COMPAZINE) injection 10 mg  10 mg Intravenous Once Rexene Agent      . sodium chloride flush (NS) 0.9 % injection 10 mL  10 mL Intravenous PRN Lloyd Huger, MD   10 mL at 04/30/18 1244    VITAL SIGNS: There were no vitals taken for this visit. There were no vitals filed for this visit.  Estimated body mass index is 21.1 kg/m as calculated from the following:   Height as of 04/16/18: '5\' 3"'  (1.6 m).   Weight as of an earlier encounter on 04/30/18: 119 lb 2 oz (54 kg).  LABS: CBC:    Component Value Date/Time   WBC 8.9 04/30/2018 1027   HGB 9.0 (L) 04/30/2018 1027   HCT 27.8 (L) 04/30/2018 1027   PLT 287 04/30/2018 1027   MCV 99.3 04/30/2018 1027   NEUTROABS 4.6 04/30/2018 1027   LYMPHSABS 3.0 04/30/2018 1027  MONOABS 1.0 04/30/2018 1027   EOSABS 0.3 04/30/2018 1027   BASOSABS 0.0 04/30/2018 1027   Comprehensive Metabolic Panel:    Component Value Date/Time   NA 137 04/30/2018 1027   K 3.0 (L) 04/30/2018 1027   CL 106 04/30/2018 1027   CO2 20 (L) 04/30/2018 1027   BUN 9 04/30/2018 1027   CREATININE 1.06 (H) 04/30/2018 1027   GLUCOSE 99 04/30/2018 1027   CALCIUM 8.4 (L) 04/30/2018 1027   AST 25 04/30/2018 1027   ALT 13 04/30/2018 1027   ALKPHOS 97 04/30/2018 1027   BILITOT 1.0 04/30/2018 1027   PROT 7.7 04/30/2018 1027   ALBUMIN 2.5 (L) 04/30/2018 1027    RADIOGRAPHIC STUDIES: Ct Abdomen Pelvis W Contrast  Result Date: 04/04/2018 CLINICAL DATA:  Abdominal pain, history of pancreatic carcinoma and elevated white blood cell count EXAM: CT ABDOMEN AND PELVIS WITH CONTRAST TECHNIQUE: Multidetector CT imaging of the abdomen and pelvis was performed using the standard protocol following  bolus administration of intravenous contrast. CONTRAST:  41m OMNIPAQUE IOHEXOL 300 MG/ML  SOLN COMPARISON:  03/10/2018 FINDINGS: Lower chest: Left-sided pleural effusion is stable. Bibasilar mild atelectatic changes are again noted. Hepatobiliary: The liver is diffusely decreased in attenuation consistent with fatty infiltration. The gallbladder has been surgically removed. No focal mass lesion in the liver is seen. Pancreas: There is a focal hypodense mass lesion with peripheral enhancement identified within the uncinate process of the pancreas consistent with the patient's given clinical history of pancreatic carcinoma. Adjacent fiducial markers are seen. The lesion abuts the superior mesenteric artery consistent with neoplasm and vascular encasement. Occupying the majority of the body of the pancreas there is a an ovoid fluid collection which measures 9.8 x 5.0 cm. 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 Peripheral enhancement is noted. The tail of the pancreas appears within normal limits. A few smaller peripancreatic fluid collections are identified consistent with pseudocysts. The largest of these smaller fluid collections is seen on image number 17 of series 2 in the splenic hilum measuring 3.5 cm in greatest dimension. Spleen: Normal in size without focal abnormality. Adrenals/Urinary Tract: Adrenal glands are within normal limits. Kidneys are well visualized bilaterally. An enhancing mass lesion arising from the left kidney is again noted and stable. This did not demonstrate hypermetabolic nature on prior PET-CT. It again measures approximately 3.1 cm in dimension. No obstructive changes are seen. The bladder is decompressed. The known periurethral diverticulum on the right is less well appreciated due to the lack of contrast opacification. Stomach/Bowel: Diverticular change of the colon is noted without evidence of diverticulitis. The  appendix is within normal limits. No obstructive change in the small bowel is identified. Vascular/Lymphatic: No significant vascular findings are present. No enlarged abdominal or pelvic lymph nodes. Reproductive: Uterus is within normal limits. No adnexal mass is seen. Other: Free fluid is noted within the pelvis as well as adjacent to the liver and spleen relatively stable from the previous exam. Fat containing periumbilical hernia is again seen and stable. Musculoskeletal: Degenerative changes of lumbar spine are noted. No definitive lytic or sclerotic lesions to suggest bony metastatic disease are seen. No compression deformities are noted. 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 Smaller pseudocysts are noted adjacent to the tail of the pancreas and spleen. Peripherally enhancing uncinate process mass consistent with the known  history of pancreatic carcinoma. Stable ascites within the abdomen and pelvis. Stable left renal mass. Electronically Signed   By: Inez Catalina M.D.   On: 04/04/2018 23:20   Dg Abd 2 Views  Result Date: 04/04/2018 CLINICAL DATA:  Mid abdominal pain. EXAM: ABDOMEN - 2 VIEW COMPARISON:  Body CT 03/10/2018 FINDINGS: The bowel gas pattern is normal. There is no evidence of free air. No radio-opaque calculi or other significant radiographic abnormality is seen. Postsurgical changes in the right upper quadrant and mid abdomen. IMPRESSION: Nonobstructive bowel gas pattern.  No free gas seen. Electronically Signed   By: Fidela Salisbury M.D.   On: 04/04/2018 15:52    PERFORMANCE STATUS (ECOG) : 1 - Symptomatic but completely ambulatory  Review of Systems As noted above. Otherwise, a complete review of systems is negative.  Physical Exam General: NAD, sitting in chair Cardiovascular: regular rate and rhythm Pulmonary: clear ant  fields Abdomen: soft, nontender, + bowel sounds,  Extremities: no edema Skin: no rashes Neurological: Weakness but otherwise nonfocal  IMPRESSION: Patient returns to the clinic today for routine follow up regarding her pain. Patient was seen in the infusion area.   Patient says pain has been been worsening over the past few days.  She has mid back pain but no focal tenderness to palpation.  Also has mid abdominal pain, generally improved with use of heat.  No history of trauma or falls.  Suspect referred pain to back from pancreatic mass.  Will obtain 2 view of thoracic spine.  We will also refer to interventional pain management for consideration of a celiac plexus block.  Patient continues to require 30 mg of oxycodone every 3 hours around-the-clock.  Will increase transdermal fentanyl to 75 mcg every 72 hours.  We gave 4 mg of IV morphine and 1 mg of IV hydromorphone today in the clinic due to intractable pain.  Afterwards, patient said that she was much more comfortable.  Case and plan discussed with Dr. Grayland Ormond.  PLAN: -Increase transdermal fentanyl 75 mcg every 72 hours -Continue oxycodone 30 mg every 3 hours as needed for BTP -Prophylactic bowel regimen -Thoracic spine 2V -Referral to interventional pain clinic for consideration of a celiac plexus block -RTC in 2 weeks or sooner if needed   Patient expressed understanding and was in agreement with this plan. She also understands that She can call clinic at any time with any questions, concerns, or complaints.    Time Total: 45 minutes  Visit consisted of counseling and education dealing with the complex and emotionally intense issues of symptom management and palliative care in the setting of serious and potentially life-threatening illness.Greater than 50%  of this time was spent counseling and coordinating care related to the above assessment and plan.  Signed by: Altha Harm, [place], NP-C, [place] (Work  Cell)

## 2018-04-30 NOTE — Progress Notes (Signed)
Patient states she had nausea and vomiting yesterday.  Nothing today.  States she is having pain in her back today.  BP 149/82 HR 112.  Patient is asking for prescription for EMLA cream.

## 2018-05-01 ENCOUNTER — Telehealth: Payer: Self-pay | Admitting: Hospice and Palliative Medicine

## 2018-05-01 ENCOUNTER — Other Ambulatory Visit: Payer: Self-pay | Admitting: *Deleted

## 2018-05-01 MED ORDER — GABAPENTIN 300 MG PO CAPS: 300.0000 mg | ORAL_CAPSULE | Freq: Every day | ORAL | 0 refills | Status: AC

## 2018-05-01 NOTE — Telephone Encounter (Signed)
Thoracic 2V reviewed. I called patient to update her. She says pain is better today. Referral has been made to interventional pain clinic for consideration of a nerve block.

## 2018-05-02 ENCOUNTER — Other Ambulatory Visit: Payer: Self-pay | Admitting: *Deleted

## 2018-05-02 NOTE — Telephone Encounter (Signed)
Pharmacy has to order Fentanyl 75 mcg and he is asking if we can order her some fentanyl 25 mcg patches to go the the 50 mcg patches she has until the pharmacy can get her 75 mcg dose in. Please advise

## 2018-05-03 ENCOUNTER — Other Ambulatory Visit: Payer: Self-pay | Admitting: *Deleted

## 2018-05-03 ENCOUNTER — Other Ambulatory Visit: Payer: Self-pay | Admitting: General Surgery

## 2018-05-03 ENCOUNTER — Telehealth: Payer: Self-pay | Admitting: *Deleted

## 2018-05-03 MED ORDER — APIXABAN 5 MG PO TABS: 5.0000 mg | ORAL_TABLET | Freq: Two times a day (BID) | ORAL | 3 refills | Status: AC

## 2018-05-03 MED ORDER — OXYCODONE HCL 15 MG PO TABS: 15.0000 mg | ORAL_TABLET | ORAL | 0 refills | Status: DC | PRN

## 2018-05-03 MED ORDER — FENTANYL 25 MCG/HR TD PT72: 1.0000 | MEDICATED_PATCH | TRANSDERMAL | 0 refills | Status: DC

## 2018-05-03 NOTE — Telephone Encounter (Signed)
She has a box of 5 50 mcg patches and he states she needs  Her Oxycodone refill also

## 2018-05-03 NOTE — Telephone Encounter (Signed)
Call returned to April regarding Lovenox. There are questions regarding dosing and administration, patient and son do not feel comfortable administering at home. Patient is scheduled for follow up with Dr. Grayland Ormond on Monday 3/16, will discuss Lovenox at this visit.  Lovenox Recommendations are below:  Tuesday- Stop Eliquis Tuesday PM- Start Lovenox 1mg /kg Wednesday AM and PM-Lovenox injections Thursday AM-Hold Lovenox Thursday PM after procedure-Lovenox injection Friday AM and PM-Take Eliquis and Lovenox Saturday-Restart Eliquis as prescribed

## 2018-05-03 NOTE — Telephone Encounter (Signed)
Per April Dr Grayland Ormond and Dr Barry Dienes spoke via [place] today regarding a Lovenox bridge for patient who is having surgery Thursday, She needs to start Lovenox Monday and April asking to speak with Dr Virgel Manifold nurse regarding this matter. [phone]

## 2018-05-03 NOTE — Telephone Encounter (Signed)
That is okay with me. How many 8mcg patches does she have left?

## 2018-05-06 ENCOUNTER — Other Ambulatory Visit: Payer: Self-pay

## 2018-05-06 ENCOUNTER — Encounter: Payer: Self-pay | Admitting: Oncology

## 2018-05-06 ENCOUNTER — Inpatient Hospital Stay: Payer: Medicare Other

## 2018-05-06 ENCOUNTER — Other Ambulatory Visit: Payer: Self-pay | Admitting: General Surgery

## 2018-05-06 ENCOUNTER — Inpatient Hospital Stay (HOSPITAL_BASED_OUTPATIENT_CLINIC_OR_DEPARTMENT_OTHER): Payer: Medicare Other | Admitting: Oncology

## 2018-05-06 VITALS — BP 127/71 | HR 81 | Temp 97.6°F | Resp 18

## 2018-05-06 DIAGNOSIS — C259 Malignant neoplasm of pancreas, unspecified: Secondary | ICD-10-CM

## 2018-05-06 DIAGNOSIS — I824Z2 Acute embolism and thrombosis of unspecified deep veins of left distal lower extremity: Secondary | ICD-10-CM

## 2018-05-06 DIAGNOSIS — R5383 Other fatigue: Secondary | ICD-10-CM

## 2018-05-06 DIAGNOSIS — N189 Chronic kidney disease, unspecified: Secondary | ICD-10-CM

## 2018-05-06 DIAGNOSIS — C250 Malignant neoplasm of head of pancreas: Secondary | ICD-10-CM | POA: Diagnosis not present

## 2018-05-06 DIAGNOSIS — D649 Anemia, unspecified: Secondary | ICD-10-CM | POA: Diagnosis not present

## 2018-05-06 DIAGNOSIS — R634 Abnormal weight loss: Secondary | ICD-10-CM

## 2018-05-06 DIAGNOSIS — Z515 Encounter for palliative care: Secondary | ICD-10-CM | POA: Diagnosis not present

## 2018-05-06 DIAGNOSIS — N2889 Other specified disorders of kidney and ureter: Secondary | ICD-10-CM | POA: Diagnosis not present

## 2018-05-06 DIAGNOSIS — K863 Pseudocyst of pancreas: Secondary | ICD-10-CM

## 2018-05-06 DIAGNOSIS — E876 Hypokalemia: Secondary | ICD-10-CM

## 2018-05-06 DIAGNOSIS — R630 Anorexia: Secondary | ICD-10-CM

## 2018-05-06 DIAGNOSIS — Z95828 Presence of other vascular implants and grafts: Secondary | ICD-10-CM

## 2018-05-06 DIAGNOSIS — R531 Weakness: Secondary | ICD-10-CM

## 2018-05-06 LAB — CBC WITH DIFFERENTIAL/PLATELET
Abs Immature Granulocytes: 0.05 K/uL (ref 0.00–0.07)
Basophils Absolute: 0 K/uL (ref 0.0–0.1)
Basophils Relative: 1 %
Eosinophils Absolute: 0.1 K/uL (ref 0.0–0.5)
Eosinophils Relative: 2 %
HCT: 27.3 % — ABNORMAL LOW (ref 36.0–46.0)
Hemoglobin: 8.9 g/dL — ABNORMAL LOW (ref 12.0–15.0)
Immature Granulocytes: 1 %
Lymphocytes Relative: 24 %
Lymphs Abs: 1.9 K/uL (ref 0.7–4.0)
MCH: 32.2 pg (ref 26.0–34.0)
MCHC: 32.6 g/dL (ref 30.0–36.0)
MCV: 98.9 fL (ref 80.0–100.0)
Monocytes Absolute: 0.8 K/uL (ref 0.1–1.0)
Monocytes Relative: 9 %
Neutro Abs: 5.1 K/uL (ref 1.7–7.7)
Neutrophils Relative %: 63 %
Platelets: 298 K/uL (ref 150–400)
RBC: 2.76 MIL/uL — ABNORMAL LOW (ref 3.87–5.11)
RDW: 16 % — ABNORMAL HIGH (ref 11.5–15.5)
WBC: 7.9 K/uL (ref 4.0–10.5)
nRBC: 0 % (ref 0.0–0.2)

## 2018-05-06 LAB — COMPREHENSIVE METABOLIC PANEL
ALT: 10 U/L (ref 0–44)
AST: 17 U/L (ref 15–41)
Albumin: 2.3 g/dL — ABNORMAL LOW (ref 3.5–5.0)
Alkaline Phosphatase: 87 U/L (ref 38–126)
Anion gap: 8 (ref 5–15)
BUN: 8 mg/dL (ref 8–23)
CO2: 21 mmol/L — ABNORMAL LOW (ref 22–32)
Calcium: 8.2 mg/dL — ABNORMAL LOW (ref 8.9–10.3)
Chloride: 106 mmol/L (ref 98–111)
Creatinine, Ser: 1.11 mg/dL — ABNORMAL HIGH (ref 0.44–1.00)
GFR calc Af Amer: 59 mL/min — ABNORMAL LOW (ref >60)
GFR, EST NON AFRICAN AMERICAN: 51 mL/min — AB (ref >60)
Glucose, Bld: 97 mg/dL (ref 70–99)
Potassium: 3.4 mmol/L — ABNORMAL LOW (ref 3.5–5.1)
Sodium: 135 mmol/L (ref 135–145)
Total Bilirubin: 1 mg/dL (ref 0.3–1.2)
Total Protein: 7.4 g/dL (ref 6.5–8.1)

## 2018-05-06 LAB — AMYLASE: Amylase: 93 U/L (ref 28–100)

## 2018-05-06 MED ORDER — HEPARIN SOD (PORK) LOCK FLUSH 100 UNIT/ML IV SOLN
INTRAVENOUS | Status: AC
  Filled 2018-05-06: qty 5

## 2018-05-06 MED ORDER — HEPARIN SOD (PORK) LOCK FLUSH 100 UNIT/ML IV SOLN
500.0000 [IU] | Freq: Once | INTRAVENOUS | Status: AC
  Administered 2018-05-06: 500 [IU] via INTRAVENOUS

## 2018-05-06 MED ORDER — SODIUM CHLORIDE 0.9% FLUSH
10.0000 mL | Freq: Once | INTRAVENOUS | Status: AC
  Administered 2018-05-06: 10 mL via INTRAVENOUS
  Filled 2018-05-06: qty 10

## 2018-05-06 MED ORDER — SODIUM CHLORIDE 0.9 % IV SOLN
Freq: Once | INTRAVENOUS | Status: AC
  Administered 2018-05-06: 11:00:00 via INTRAVENOUS
  Filled 2018-05-06: qty 250

## 2018-05-06 NOTE — Progress Notes (Addendum)
Nutrition Assessment   Reason for Assessment:   Weight loss, planning J-tube placement    ASSESSMENT:  68 year old female with stage Ib adenocarcinoma of pancreas.  Past medical history of HTN, UTI.  Noted patient with pseudocyst from recent bout of pancreatitis.  Patient receiving gemcitabine.  Patient planning diagnostic lap with J-tube placement on 3/19 by Dr. Barry Dienes.    Met with patient and son in clinic this am.  Patient reports poor appetite, abdominal pain, nausea when she smells food and fatigue as contributors to not eating well.  Patient reports has typically 1 loose stool per day usually after eating soups.  Typically does not eat breakfast, lunch is usually few bites of sandwich or soup and same for dinner.  Son reports really since August patient has been eating few bites at meal time.     Nutrition Focused Physical Exam: deferred   Medications: folic acid, KCL, phenergan   Labs: K 3.4, creatinine 1.11   Anthropometrics:   Height: 63 inches Weight: 119 lb on 3/10 UBW: 188 lb in April of last year per patient 03/10/2018 148 lb  155 lb Nov 2019 BMI: 21  23% weight loss in the last 3 months, significant   Estimated Energy Needs  Kcals: 1620-1890 calories Protein: 81-94 g  Fluid: 1.8 L   NUTRITION DIAGNOSIS: Malnutrition related to cancer and cancer related treatment side effects as evidenced by 23% weight loss in the last 3 months and eating < or equal to 75% estimated energy needs for > or equal to 1 month   MALNUTRITION DIAGNOSIS: Patient meets criteria for 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  Patient at risk for refeeding syndrome once tube feeding started.   Discussed high calorie, high protein foods and handout  provided.  Encouraged patient to be proactive with taking nausea medication when smells effect her. She currently has not been taking nausea medications.  Contact information provided   MONITORING, EVALUATION, GOAL: Patient will consume adequate calories and protein to meet nutritional needs with J-tube and oral intake   Next Visit: March 30th  Cindy Bowman B. Zenia Resides, [place], [place] Registered Dietitian [phone] (pager)

## 2018-05-06 NOTE — Patient Instructions (Signed)
Monday 3/16- stop Eliquis after evening dose Tuesday 3/17- Lovenox once daily (to be administered at cancer center or by home health) Wednesday 3/18- Lovenox once daily (to be administered at cancer center or by home health) Thursday 3/19- Day of procedure, do not take Lovenox or Eliquis Friday 3/20- Lovenox and Eliquis both to be given in the hospital (Lovenox once daily, resume eliquis twice daily) Saturday 3/21- Resume taking Eliquis twice daily as prior to procedure

## 2018-05-06 NOTE — Progress Notes (Signed)
[facility]  [phone] [phone]) [phone]  ID: Cindy Bowman OB: 1950/07/10  MR#: [id number]  ACZ#:[id number]  Patient Care Team: Glendon Axe, MD as PCP - General (Internal Medicine) Clent Jacks, RN as Registered Nurse Lloyd Huger, MD as Medical Oncologist (Medical Oncology)  CHIEF COMPLAINT: Clinical stage Ib pancreatic adenocarcinoma.  INTERVAL HISTORY: Patient returns to clinic today for repeat laboratory work and further evaluation.  She states her pain is better controlled today.  She was evaluated by surgery last week and is agreed to J-tube placement on Thursday. She continues to have chronic weakness and fatigue.  She has no neurologic complaints.  She denies any recent fevers.  She continues to have a poor appetite and weight loss.  She has no chest pain or shortness of breath.  She denies any nausea, vomiting, constipation, or diarrhea.  She has no urinary complaints.  Patient offers no further specific complaints today.  REVIEW OF SYSTEMS:   Review of Systems  Constitutional: Positive for malaise/fatigue and weight loss. Negative for fever.  HENT: Negative.  Negative for congestion and sinus pain.   Respiratory: Negative.  Negative for cough and shortness of breath.   Cardiovascular: Negative for chest pain and leg swelling.  Gastrointestinal: Positive for abdominal pain. Negative for blood in stool, diarrhea, melena, nausea and vomiting.  Genitourinary: Negative.  Negative for dysuria.  Musculoskeletal: Negative.  Negative for back pain.  Skin: Negative.  Negative for rash.  Neurological: Positive for weakness. Negative for dizziness, focal weakness and headaches.  Psychiatric/Behavioral: Negative.  The patient is not nervous/anxious.     As per HPI. Otherwise, a complete review of systems is negative.  PAST MEDICAL HISTORY: Past Medical History:  Diagnosis Date  . Essential hypertension   . Pancreatic cancer ([place])   . UTI  (urinary tract infection)     PAST SURGICAL HISTORY: Past Surgical History:  Procedure Laterality Date  . CESAREAN SECTION    . CHOLECYSTECTOMY    . EUS N/A 10/25/2017   Procedure: FULL UPPER ENDOSCOPIC ULTRASOUND (EUS) RADIAL;  Surgeon: Jola Schmidt, MD;  Location: [facility] ENDOSCOPY;  Service: Endoscopy;  Laterality: N/A;  . OOPHORECTOMY    . PORTA CATH INSERTION N/A 11/12/2017   Procedure: PORTA CATH INSERTION;  Surgeon: Algernon Huxley, MD;  Location: [facility];  Service: Cardiovascular;  Laterality: N/A;  . TUBAL LIGATION      FAMILY HISTORY: Family History  Problem Relation Age of Onset  . Hypertension Mother        deceased 49  . Heart attack Father        deceased late 24s    ADVANCED DIRECTIVES (Y/N):  N  HEALTH MAINTENANCE: Social History   Tobacco Use  . Smoking status: Never Smoker  . Smokeless tobacco: Never Used  Substance Use Topics  . Alcohol use: Not Currently  . Drug use: Never     Colonoscopy:  PAP:  Bone density:  Lipid panel:  Allergies  Allergen Reactions  . Nsaids Other (See Comments)    Decreased GFR  . Penicillins     Yeast infection Did it involve swelling of the face/tongue/throat, SOB, or low BP? No Did it involve sudden or severe rash/hives, skin peeling, or any reaction on the inside of your mouth or nose? No Did you need to seek medical attention at a hospital or doctor's office? No When did it last happen?Unknown If all above answers are "NO", may proceed with cephalosporin use.  Current Outpatient Medications  Medication Sig Dispense Refill  . apixaban (ELIQUIS) 5 MG TABS tablet Take 1 tablet (5 mg total) by mouth 2 (two) times daily. 60 tablet 3  . fentaNYL (DURAGESIC) 75 MCG/HR Place 1 patch onto the skin every 3 (three) days. 5 patch 0  . folic acid (FOLVITE) 1 MG tablet TAKE 1 TABLET BY MOUTH EVERY DAY (Patient taking differently: Take 1 mg by mouth daily. ) 90 tablet 1  . gabapentin (NEURONTIN) 300 MG  capsule Take 1 capsule (300 mg total) by mouth at bedtime. 90 capsule 0  . lidocaine-prilocaine (EMLA) cream Apply 1 application topically as needed. (Patient taking differently: Apply 1 application topically as needed (port access). ) 30 g 2  . lipase/protease/amylase (CREON) 36000 UNITS CPEP capsule Take 36,000-72,000 Units by mouth See admin instructions. Take 36000 units with small meals and 72000 units with big meals    . loperamide (IMODIUM) 2 MG capsule Take 2 mg by mouth as needed for diarrhea or loose stools.     Marland Kitchen losartan (COZAAR) 50 MG tablet Take 1 tablet (50 mg total) by mouth daily. 30 tablet 0  . NARCAN 4 MG/0.1ML LIQD nasal spray kit Place 1 spray into the nose as needed (opioid overdose).     Marland Kitchen oxyCODONE (ROXICODONE) 15 MG immediate release tablet Take 1-2 tablets (15-30 mg total) by mouth every 4 (four) hours as needed for pain. 90 tablet 0  . potassium chloride (KLOR-CON SPRINKLE) 10 MEQ CR capsule Take 2 capsules (20 mEq total) by mouth daily. May open capsule to sprinkle on food. 30 capsule 0  . promethazine (PHENERGAN) 12.5 MG tablet Take 12.5 mg by mouth every 6 (six) hours as needed for nausea or vomiting.     No current facility-administered medications for this visit.    Facility-Administered Medications Ordered in Other Visits  Medication Dose Route Frequency Provider Last Rate Last Dose  . prochlorperazine (COMPAZINE) injection 10 mg  10 mg Intravenous Once Rexene Agent        OBJECTIVE: Vitals:   05/06/18 1035  BP: 127/71  Pulse: 81  Resp: 18  Temp: 97.6 F (36.4 C)     There is no height or weight on file to calculate BMI.    ECOG FS:0 - Asymptomatic  General: Thin, no acute distress.  Sitting in a wheelchair. Eyes: Pink conjunctiva, anicteric sclera. HEENT: Normocephalic, moist mucous membranes. Lungs: Clear to auscultation bilaterally. Heart: Regular rate and rhythm. No rubs, murmurs, or gallops. Abdomen: Soft, nontender, nondistended. No organomegaly  noted, normoactive bowel sounds. Musculoskeletal: No edema, cyanosis, or clubbing. Neuro: Alert, answering all questions appropriately. Cranial nerves grossly intact. Skin: No rashes or petechiae noted. Psych: Normal affect.  LAB RESULTS:  Lab Results  Component Value Date   NA 135 05/06/2018   K 3.4 (L) 05/06/2018   CL 106 05/06/2018   CO2 21 (L) 05/06/2018   GLUCOSE 97 05/06/2018   BUN 8 05/06/2018   CREATININE 1.11 (H) 05/06/2018   CALCIUM 8.2 (L) 05/06/2018   PROT 7.4 05/06/2018   ALBUMIN 2.3 (L) 05/06/2018   AST 17 05/06/2018   ALT 10 05/06/2018   ALKPHOS 87 05/06/2018   BILITOT 1.0 05/06/2018   GFRNONAA 51 (L) 05/06/2018   GFRAA 59 (L) 05/06/2018    Lab Results  Component Value Date   WBC 7.9 05/06/2018   NEUTROABS 5.1 05/06/2018   HGB 8.9 (L) 05/06/2018   HCT 27.3 (L) 05/06/2018   MCV 98.9 05/06/2018   PLT  298 05/06/2018     STUDIES: Dg Thoracic Spine 2 View  Result Date: 05/01/2018 CLINICAL DATA:  Pancreatic cancer.  Back pain. EXAM: THORACIC SPINE 2 VIEWS COMPARISON:  Chest radiography 03/10/2018 FINDINGS: Very minimal spinal curvature. No significant disc space narrowing. Ordinary marginal osteophytes. No evidence of fracture or destructive lesion. Posteromedial ribs appear normal. IMPRESSION: No definitely significant finding. Ordinary age related changes. Ordinary marginal osteophytes. No sign of fracture or destructive lesion. Electronically Signed   By: Nelson Chimes M.D.   On: 05/01/2018 09:11    ASSESSMENT: Clinical stage Ib pancreatic adenocarcinoma.  PLAN:    1.  Clinical stage Ib pancreatic adenocarcinoma: EUS completed on October 25, 2017 confirmed adenocarcinoma.   Patient's CA 19-9 on January 09, 2018 reported at 7. Neoadjuvant FOLFIRINOX was too toxic for patient, therefore she was switched to single agent gemcitabine. Repeat CT scan on April 04, 2018 reviewed independently with persistent malignancy along with pseudocyst from recent bout  of pancreatitis.  No further chemotherapy is planned at this time, but patient will likely require additional treatment in the future.  Patient has now completed XRT.  She is scheduled for J-tube placement later this week.  Return to clinic in 1 week for further evaluation.   2.  Anemia: Hemoglobin is decreased, but essentially unchanged at 8.9.  Monitor. 3.  Pain: Continue fentanyl patch to 75 mcg every 3 days.  Continue oxycodone and gabapentin.  Patient was also given referral for interventional pain clinic for consideration of celiac plexus block.  Appreciate palliative care input. 4.  Renal mass: CT scan results as above.  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 No intervention is needed at this time.  Continue to monitor while patient is undergoing treatment for her pancreatic cancer.  Patient was previously given a referral to urology.   4.  Genetic: Genetic testing is pending at time of dictation. 5.  Chronic renal insufficiency: Patient's creatinine is 1.11 today.  Monitor. 6.  Hypokalemia: Improved to 3.4.  Continue with potassium powder. 7.  Diarrhea: Patient does not complain of this today.  Continue Lomotil as needed. 8.  Poor appetite/weight loss: Continue Megace as prescribed.  J-tube placement later this week.  Appreciate dietary intake. 9.  Left lower extremity DVT: Confirmed by ultrasound.  Patient has been instructed to hold Eliquis Tuesday, Wednesday, and Thursday of this week and reinitiate treatment Friday.  She will be given Lovenox at 1.5 mg/kg on Tuesday and Wednesday.  No treatment Thursday and then Lovenox again on Friday.  This was relayed to the primary surgical team.  Patient expressed understanding and was in agreement with this plan. She also understands that She can call clinic at any time with any questions, concerns, or complaints.   Cancer Staging Malignant neoplasm of head of pancreas [facility]) Staging form: Exocrine Pancreas, AJCC 8th Edition - Clinical stage  from 10/26/2017: Stage IB (cT2, cN0, cM0) - Signed by Lloyd Huger, MD on 10/26/2017   Lloyd Huger, MD   05/06/2018 3:21 PM

## 2018-05-06 NOTE — Progress Notes (Signed)
Patient here today for follow up regarding pancreatic cancer. Patient reports 5/6 abdominal pain today. Patient is scheduled for procedure on 3/19.

## 2018-05-07 ENCOUNTER — Other Ambulatory Visit: Payer: Self-pay

## 2018-05-07 ENCOUNTER — Other Ambulatory Visit: Payer: Self-pay | Admitting: Oncology

## 2018-05-07 ENCOUNTER — Encounter (HOSPITAL_COMMUNITY)
Admission: RE | Admit: 2018-05-07 | Discharge: 2018-05-07 | Disposition: A | Discharge status: Home / Self Care | Payer: Medicare Other | Source: Ambulatory Visit | Attending: General Surgery | Admitting: General Surgery

## 2018-05-07 ENCOUNTER — Inpatient Hospital Stay: Payer: Medicare Other | Admitting: Hospice and Palliative Medicine

## 2018-05-07 ENCOUNTER — Encounter (HOSPITAL_COMMUNITY): Payer: Self-pay

## 2018-05-07 ENCOUNTER — Inpatient Hospital Stay: Payer: Medicare Other

## 2018-05-07 DIAGNOSIS — Z01812 Encounter for preprocedural laboratory examination: Secondary | ICD-10-CM | POA: Insufficient documentation

## 2018-05-07 DIAGNOSIS — Z515 Encounter for palliative care: Secondary | ICD-10-CM | POA: Diagnosis not present

## 2018-05-07 DIAGNOSIS — C250 Malignant neoplasm of head of pancreas: Secondary | ICD-10-CM

## 2018-05-07 HISTORY — DX: Adverse effect of unspecified anesthetic, initial encounter: T41.45XA

## 2018-05-07 HISTORY — DX: Other specified postprocedural states: Z98.890

## 2018-05-07 HISTORY — DX: Other complications of anesthesia, initial encounter: T88.59XA

## 2018-05-07 HISTORY — DX: Other specified postprocedural states: R11.2

## 2018-05-07 HISTORY — DX: Anemia, unspecified: D64.9

## 2018-05-07 LAB — URINALYSIS, COMPLETE (UACMP) WITH MICROSCOPIC
Glucose, UA: NEGATIVE mg/dL
HGB URINE DIPSTICK: NEGATIVE
Ketones, ur: 80 mg/dL — AB
Nitrite: NEGATIVE
Protein, ur: 100 mg/dL — AB
Specific Gravity, Urine: 1.021 (ref 1.005–1.030)
pH: 5 (ref 5.0–8.0)

## 2018-05-07 LAB — COMPREHENSIVE METABOLIC PANEL
ALT: 12 U/L (ref 0–44)
AST: 23 U/L (ref 15–41)
Albumin: 2.8 g/dL — ABNORMAL LOW (ref 3.5–5.0)
Alkaline Phosphatase: 96 U/L (ref 38–126)
Anion gap: 12 (ref 5–15)
BUN: 7 mg/dL — ABNORMAL LOW (ref 8–23)
CO2: 21 mmol/L — ABNORMAL LOW (ref 22–32)
Calcium: 8.8 mg/dL — ABNORMAL LOW (ref 8.9–10.3)
Chloride: 105 mmol/L (ref 98–111)
Creatinine, Ser: 1.04 mg/dL — ABNORMAL HIGH (ref 0.44–1.00)
GFR calc non Af Amer: 55 mL/min — ABNORMAL LOW (ref >60)
Glucose, Bld: 91 mg/dL (ref 70–99)
Potassium: 4 mmol/L (ref 3.5–5.1)
SODIUM: 138 mmol/L (ref 135–145)
Total Bilirubin: 1 mg/dL (ref 0.3–1.2)
Total Protein: 8.2 g/dL — ABNORMAL HIGH (ref 6.5–8.1)

## 2018-05-07 LAB — CBC WITH DIFFERENTIAL/PLATELET
Abs Immature Granulocytes: 0.05 10*3/uL (ref 0.00–0.07)
Basophils Absolute: 0 10*3/uL (ref 0.0–0.1)
Basophils Relative: 1 %
Eosinophils Absolute: 0.1 10*3/uL (ref 0.0–0.5)
Eosinophils Relative: 2 %
HCT: 30.8 % — ABNORMAL LOW (ref 36.0–46.0)
Hemoglobin: 9.5 g/dL — ABNORMAL LOW (ref 12.0–15.0)
Immature Granulocytes: 1 %
Lymphocytes Relative: 18 %
Lymphs Abs: 1.5 10*3/uL (ref 0.7–4.0)
MCH: 31.8 pg (ref 26.0–34.0)
MCHC: 30.8 g/dL (ref 30.0–36.0)
MCV: 103 fL — ABNORMAL HIGH (ref 80.0–100.0)
Monocytes Absolute: 0.7 10*3/uL (ref 0.1–1.0)
Monocytes Relative: 9 %
Neutro Abs: 5.8 10*3/uL (ref 1.7–7.7)
Neutrophils Relative %: 69 %
Platelets: 316 10*3/uL (ref 150–400)
RBC: 2.99 MIL/uL — ABNORMAL LOW (ref 3.87–5.11)
RDW: 16.3 % — ABNORMAL HIGH (ref 11.5–15.5)
WBC: 8.2 10*3/uL (ref 4.0–10.5)
nRBC: 0 % (ref 0.0–0.2)

## 2018-05-07 LAB — PROTIME-INR
INR: 1.2 (ref 0.8–1.2)
Prothrombin Time: 15.4 seconds — ABNORMAL HIGH (ref 11.4–15.2)

## 2018-05-07 LAB — ABO/RH: ABO/RH(D): O POS

## 2018-05-07 MED ORDER — ENOXAPARIN SODIUM 80 MG/0.8ML ~~LOC~~ SOLN
80.0000 mg | Freq: Once | SUBCUTANEOUS | Status: AC
  Administered 2018-05-07: 80 mg via SUBCUTANEOUS

## 2018-05-07 NOTE — Patient Instructions (Signed)
Cindy Bowman  05/07/2018   Your procedure is scheduled on: 05-09-18   Report to [facility] Main  Entrance              Report to  Short stay  at                              [facility] AM    Call this number if you have problems the morning of surgery [phone]    Remember: Do not eat food:After Midnight.  NO SOLID FOOD AFTER MIDNIGHT THE NIGHT PRIOR TO SURGERY. NOTHING BY MOUTH EXCEPT CLEAR LIQUIDS UNTIL 3 HOURS PRIOR TO [place] SURGERY. PLEASE FINISH ENSURE DRINK PER SURGEON ORDER 3 HOURS PRIOR TO SCHEDULED SURGERY TIME WHICH NEEDS TO BE COMPLETED AT ____0430 am then nothing by mouth________.    CLEAR LIQUID DIET   Foods Allowed                                                                     Foods Excluded  Coffee and tea, regular and decaf                             liquids that you cannot  Plain Jell-O in any flavor                                             see through such as: Fruit ices (not with fruit pulp)                                     milk, soups, orange juice  Iced Popsicles                                    All solid food Carbonated beverages, regular and diet                                    Cranberry, grape and apple juices Sports drinks like Gatorade Lightly seasoned clear broth or consume(fat free) Sugar, honey syrup   _____________________________________________________________________    BRUSH YOUR TEETH MORNING OF SURGERY AND RINSE YOUR MOUTH OUT, NO CHEWING GUM CANDY OR MINTS.     Take these medicines the morning of surgery with A SIP OF WATER: NONE                                You may not have any metal on your body including hair pins and              piercings  Do not wear jewelry, make-up, lotions, powders or perfumes, deodorant  Do not wear nail polish.  Do not shave  48 hours prior to surgery.               Do not bring valuables to the hospital. [place].  Contacts, dentures or bridgework may not be worn into surgery.      Patients discharged the day of surgery will not be allowed to drive home. IF YOU ARE HAVING SURGERY AND GOING HOME THE SAME DAY, YOU MUST HAVE AN ADULT TO DRIVE YOU HOME AND BE WITH YOU FOR 24 HOURS. YOU MAY GO HOME BY TAXI OR UBER OR ORTHERWISE, BUT AN ADULT MUST ACCOMPANY YOU HOME AND STAY WITH YOU FOR 24 HOURS.  Name and phone number of your driver:  Special Instructions: N/A              Please read over the following fact sheets you were given: _____________________________________________________________________             [facility] - Preparing for Surgery Before surgery, you can play an important role.  Because skin is not sterile, your skin needs to be as free of germs as possible.  You can reduce the number of germs on your skin by washing with CHG (chlorahexidine gluconate) soap before surgery.  CHG is an antiseptic cleaner which kills germs and bonds with the skin to continue killing germs even after washing. Please DO NOT use if you have an allergy to CHG or antibacterial soaps.  If your skin becomes reddened/irritated stop using the CHG and inform your nurse when you arrive at Short Stay. Do not shave (including legs and underarms) for at least 48 hours prior to the first CHG shower.  You may shave your face/neck. Please follow these instructions carefully:  1.  Shower with CHG Soap the night before surgery and the  morning of Surgery.  2.  If you choose to wash your hair, wash your hair first as usual with your  normal  shampoo.  3.  After you shampoo, rinse your hair and body thoroughly to remove the  shampoo.                           4.  Use CHG as you would any other liquid soap.  You can apply chg directly  to the skin and wash                       Gently with a scrungie or clean washcloth.  5.  Apply the CHG Soap to your body ONLY FROM THE NECK DOWN.   Do not use on face/ open                            Wound or open sores. Avoid contact with eyes, ears mouth and genitals (private parts).                       Wash face,  Genitals (private parts) with your normal soap.             6.  Wash thoroughly, paying special attention to the area where your surgery  will be performed.  7.  Thoroughly rinse your body with warm water from the neck down.  8.  DO NOT shower/wash  with your normal soap after using and rinsing off  the CHG Soap.                9.  Pat yourself dry with a clean towel.            10.  Wear clean pajamas.            11.  Place clean sheets on your bed the night of your first shower and do not  sleep with pets. Day of Surgery : Do not apply any lotions/deodorants the morning of surgery.  Please wear clean clothes to the hospital/surgery center.  FAILURE TO FOLLOW THESE INSTRUCTIONS MAY RESULT IN THE CANCELLATION OF YOUR SURGERY PATIENT SIGNATURE_________________________________  NURSE SIGNATURE__________________________________  ________________________________________________________________________  WHAT IS A BLOOD TRANSFUSION? Blood Transfusion Information  A transfusion is the replacement of blood or some of its parts. Blood is made up of multiple cells which provide different functions.  Red blood cells carry oxygen and are used for blood loss replacement.  White blood cells fight against infection.  Platelets control bleeding.  Plasma helps clot blood.  Other blood products are available for specialized needs, such as hemophilia or other clotting disorders. BEFORE THE TRANSFUSION  Who gives blood for transfusions?   Healthy volunteers who are fully evaluated to make sure their blood is safe. This is blood bank blood. Transfusion therapy is the safest it has ever been in the practice of medicine. Before blood is taken from a donor, a complete history is taken to make sure that person has no history of diseases nor engages in risky social behavior  (examples are intravenous drug use or sexual activity with multiple partners). The donor's travel history is screened to minimize risk of transmitting infections, such as malaria. The donated blood is tested for signs of infectious diseases, such as HIV and hepatitis. The blood is then tested to be sure it is compatible with you in order to minimize the chance of a transfusion reaction. If you or a relative donates blood, this is often done in anticipation of surgery and is not appropriate for emergency situations. It takes many days to process the donated blood. RISKS AND COMPLICATIONS Although transfusion therapy is very safe and saves many lives, the main dangers of transfusion include:   Getting an infectious disease.  Developing a transfusion reaction. This is an allergic reaction to something in the blood you were given. Every precaution is taken to prevent this. The decision to have a blood transfusion has been considered carefully by your caregiver before blood is given. Blood is not given unless the benefits outweigh the risks. AFTER THE TRANSFUSION  Right after receiving a blood transfusion, you will usually feel much better and more energetic. This is especially true if your red blood cells have gotten low (anemic). The transfusion raises the level of the red blood cells which carry oxygen, and this usually causes an energy increase.  The nurse administering the transfusion will monitor you carefully for complications. HOME CARE INSTRUCTIONS  No special instructions are needed after a transfusion. You may find your energy is better. Speak with your caregiver about any limitations on activity for underlying diseases you may have. SEEK MEDICAL CARE IF:   Your condition is not improving after your transfusion.  You develop redness or irritation at the intravenous (IV) site. SEEK IMMEDIATE MEDICAL CARE IF:  Any of the following symptoms occur over the next 12 hours:  Shaking  chills.  You have a temperature  by mouth above 102 F (38.9 C), not controlled by medicine.  Chest, back, or muscle pain.  People around you feel you are not acting correctly or are confused.  Shortness of breath or difficulty breathing.  Dizziness and fainting.  You get a rash or develop hives.  You have a decrease in urine output.  Your urine turns a dark color or changes to pink, red, or brown. Any of the following symptoms occur over the next 10 days:  You have a temperature by mouth above 102 F (38.9 C), not controlled by medicine.  Shortness of breath.  Weakness after normal activity.  The white part of the eye turns yellow (jaundice).  You have a decrease in the amount of urine or are urinating less often.  Your urine turns a dark color or changes to pink, red, or brown. Document Released: 02/04/2000 Document Revised: 05/01/2011 Document Reviewed: 09/23/2007 ExitCare Patient Information 2014 [place].  _______________________________________________________________________  Incentive Spirometer  An incentive spirometer is a tool that can help keep your lungs clear and active. This tool measures how well you are filling your lungs with each breath. Taking long deep breaths may help reverse or decrease the chance of developing breathing (pulmonary) problems (especially infection) following:  A long period of time when you are unable to move or be active. BEFORE THE PROCEDURE   If the spirometer includes an indicator to show your best effort, your nurse or respiratory therapist will set it to a desired goal.  If possible, sit up straight or lean slightly forward. Try not to slouch.  Hold the incentive spirometer in an upright position. INSTRUCTIONS FOR USE  1. Sit on the edge of your bed if possible, or sit up as far as you can in bed or on a chair. 2. Hold the incentive spirometer in an upright position. 3. Breathe out normally. 4. Place the  mouthpiece in your mouth and seal your lips tightly around it. 5. Breathe in slowly and as deeply as possible, raising the piston or the ball toward the top of the column. 6. Hold your breath for 3-5 seconds or for as long as possible. Allow the piston or ball to fall to the bottom of the column. 7. Remove the mouthpiece from your mouth and breathe out normally. 8. Rest for a few seconds and repeat Steps 1 through 7 at least 10 times every 1-2 hours when you are awake. Take your time and take a few normal breaths between deep breaths. 9. The spirometer may include an indicator to show your best effort. Use the indicator as a goal to work toward during each repetition. 10. After each set of 10 deep breaths, practice coughing to be sure your lungs are clear. If you have an incision (the cut made at the time of surgery), support your incision when coughing by placing a pillow or rolled up towels firmly against it. Once you are able to get out of bed, walk around indoors and cough well. You may stop using the incentive spirometer when instructed by your caregiver.  RISKS AND COMPLICATIONS  Take your time so you do not get dizzy or light-headed.  If you are in pain, you may need to take or ask for pain medication before doing incentive spirometry. It is harder to take a deep breath if you are having pain. AFTER USE  Rest and breathe slowly and easily.  It can be helpful to keep track of a log of your progress. Your caregiver  can provide you with a simple table to help with this. If you are using the spirometer at home, follow these instructions: [place] IF:   You are having difficultly using the spirometer.  You have trouble using the spirometer as often as instructed.  Your pain medication is not giving enough relief while using the spirometer.  You develop fever of 100.5 F (38.1 C) or higher. SEEK IMMEDIATE MEDICAL CARE IF:   You cough up bloody sputum that had not been present  before.  You develop fever of 102 F (38.9 C) or greater.  You develop worsening pain at or near the incision site. MAKE SURE YOU:   Understand these instructions.  Will watch your condition.  Will get help right away if you are not doing well or get worse. Document Released: 06/19/2006 Document Revised: 05/01/2011 Document Reviewed: 08/20/2006 [facility] Patient Information 2014 [place], [place].   ________________________________________________________________________

## 2018-05-07 NOTE — Progress Notes (Signed)
ekg 03-10-18 on chart  Cbc/ diff 05-06-18 chart

## 2018-05-07 NOTE — Progress Notes (Addendum)
PCP: Dr. Glendon Axe  CARDIOLOGIST: none  INFO IN Epic:  hgb 9.5 pt 15.4  INFO ON CHART:  BLOOD THINNERS AND LAST DOSES: Elequis hold March 17,18,19 lovenox bridge ____________________________________  PATIENT SYMPTOMS AT TIME OF PREOP:  [place] from  Pancreatic cancer

## 2018-05-08 ENCOUNTER — Inpatient Hospital Stay: Payer: Medicare Other

## 2018-05-08 ENCOUNTER — Encounter: Payer: Self-pay | Admitting: Radiology

## 2018-05-08 ENCOUNTER — Other Ambulatory Visit: Payer: Self-pay

## 2018-05-08 ENCOUNTER — Telehealth: Payer: Self-pay | Admitting: *Deleted

## 2018-05-08 ENCOUNTER — Ambulatory Visit
Admission: RE | Admit: 2018-05-08 | Discharge: 2018-05-08 | Disposition: A | Discharge status: Home / Self Care | Payer: Medicare Other | Source: Ambulatory Visit | Attending: Oncology | Admitting: Oncology

## 2018-05-08 DIAGNOSIS — C250 Malignant neoplasm of head of pancreas: Secondary | ICD-10-CM

## 2018-05-08 DIAGNOSIS — Z515 Encounter for palliative care: Secondary | ICD-10-CM | POA: Diagnosis not present

## 2018-05-08 HISTORY — PX: IR RADIOLOGIST EVAL & MGMT: IMG5224

## 2018-05-08 MED ORDER — ENOXAPARIN SODIUM 80 MG/0.8ML ~~LOC~~ SOLN
80.0000 mg | Freq: Once | SUBCUTANEOUS | Status: AC
  Administered 2018-05-08: 80 mg via SUBCUTANEOUS

## 2018-05-08 NOTE — Anesthesia Preprocedure Evaluation (Addendum)
Anesthesia Evaluation  Patient identified by MRN, date of birth, ID band Patient awake    Reviewed: Allergy & Precautions, NPO status , Patient's Chart, lab work & pertinent test results  History of Anesthesia Complications (+) PONV, DIFFICULT AIRWAY and history of anesthetic complications  Airway Mallampati: II  TM Distance: >3 FB Neck ROM: Full    Dental  (+) Dental Advisory Given, Chipped   Pulmonary neg pulmonary ROS,    Pulmonary exam normal breath sounds clear to auscultation       Cardiovascular hypertension, Pt. on medications + DVT  Normal cardiovascular exam Rhythm:Regular Rate:Normal     Neuro/Psych negative neurological ROS     GI/Hepatic negative GI ROS, PANCREATIC CANCER SEVERE PROTEIN CALORIE MALNUTRITION   Endo/Other  negative endocrine ROS  Renal/GU Renal InsufficiencyRenal disease     Musculoskeletal negative musculoskeletal ROS (+)   Abdominal   Peds  Hematology  (+) Blood dyscrasia (Eliquis), anemia ,   Anesthesia Other Findings Day of surgery medications reviewed with the patient.  Reproductive/Obstetrics                          Anesthesia Physical Anesthesia Plan  ASA: III  Anesthesia Plan: General   Post-op Pain Management:    Induction: Intravenous  PONV Risk Score and Plan: 4 or greater and Midazolam, Diphenhydramine, Dexamethasone, Ondansetron, TIVA and Scopolamine patch - Pre-op  Airway Management Planned: Oral ETT and Video Laryngoscope Planned  Additional Equipment:   Intra-op Plan:   Post-operative Plan: Extubation in OR  Informed Consent: I have reviewed the patients History and Physical, chart, labs and discussed the procedure including the risks, benefits and alternatives for the proposed anesthesia with the patient or authorized representative who has indicated his/her understanding and acceptance.     Dental advisory given  Plan  Discussed with: CRNA  Anesthesia Plan Comments: (2nd LARGE BORE PIV after induction. Possible arterial line.)    Anesthesia Quick Evaluation

## 2018-05-08 NOTE — Telephone Encounter (Signed)
Call returned to [place] and advised to only do appointment with Dr Vernard Gambles and to cancel Pain Clinic appt

## 2018-05-08 NOTE — Telephone Encounter (Signed)
Son called asking about referral to Dr Jarvis Newcomer for nerve block and then got a call from the Pain clinic today She has already had a consult with Dr Jarvis Newcomer , does she need to go to the Pain Clinic too? Does not want to incur more financial burden if not necessary. Please return his call to discuss. [phone]

## 2018-05-08 NOTE — Progress Notes (Signed)
Anesthesia Chart Review   Case:  [id number] Date/Time:  05/09/18 0715   Procedure:  LAPAROSCOPY DIAGNOSTIC AND J TUBE PLACEMENT (FEEDING TUBE) (N/A )   Anesthesia type:  General   Pre-op diagnosis:  PANCREATIC CANCER SEVERE PROTEIN CALORIE MALNUTRITION   Location:  [facility] ROOM 01 / [facility]   Surgeon:  Stark Klein, MD      DISCUSSION: 68 yo never smoker with h/o PONV, HTN, DVT 01/2018 (on Eliquis, Lovenox bridging instructions given to pt ), anemia, pancreatic cancer scheduled for above procedure 05/09/18 with Dr. Stark Klein on 05/09/18.    Pt can proceed with planned procedure barring acute status change.  VS: BP 137/63   Pulse 96   Temp 36.7 C (Oral)   Resp 18   Ht '5\' 3"'  (1.6 m)   Wt 55.1 kg   SpO2 100%   BMI 21.52 kg/m   PROVIDERS: Glendon Axe, MD is PCP   Delight Hoh, MD is Oncologist  LABS: Labs reviewed: Acceptable for surgery. (all labs ordered are listed, but only abnormal results are displayed)  Labs Reviewed  CBC WITH DIFFERENTIAL/PLATELET - Abnormal; Notable for the following components:      Result Value   RBC 2.99 (*)    Hemoglobin 9.5 (*)    HCT 30.8 (*)    MCV 103.0 (*)    RDW 16.3 (*)    All other components within normal limits  COMPREHENSIVE METABOLIC PANEL - Abnormal; Notable for the following components:   CO2 21 (*)    BUN 7 (*)    Creatinine, Ser 1.04 (*)    Calcium 8.8 (*)    Total Protein 8.2 (*)    Albumin 2.8 (*)    GFR calc non Af Amer 55 (*)    All other components within normal limits  PROTIME-INR - Abnormal; Notable for the following components:   Prothrombin Time 15.4 (*)    All other components within normal limits  URINALYSIS, COMPLETE (UACMP) WITH MICROSCOPIC - Abnormal; Notable for the following components:   APPearance HAZY (*)    Bilirubin Urine SMALL (*)    Ketones, ur 80 (*)    Protein, ur 100 (*)    Leukocytes,Ua TRACE (*)    Bacteria, UA RARE (*)    All other components within normal limits  TYPE AND SCREEN   ABO/RH     IMAGES: CT Abdomen Pelvis 04/04/18 IMPRESSION: Previously seen area of acute necrotizing pancreatitis has evolved into a large pancreatic pseudocyst occupying the majority of the pancreatic body as described above. Smaller pseudocysts are noted adjacent to the tail of the pancreas and spleen.  Peripherally enhancing uncinate process mass consistent with the known history of pancreatic carcinoma.  Stable ascites within the abdomen and pelvis.  Stable left renal mass.  EKG: 03/10/18 Rate 114 bpm Sinus tachycardia  Otherwise normal ECG   CV:  Past Medical History:  Diagnosis Date  . Anemia   . Complication of anesthesia   . Essential hypertension   . Pancreatic cancer ([place])   . PONV (postoperative nausea and vomiting)   . UTI (urinary tract infection)     Past Surgical History:  Procedure Laterality Date  . CESAREAN SECTION    . CHOLECYSTECTOMY    . EUS N/A 10/25/2017   Procedure: FULL UPPER ENDOSCOPIC ULTRASOUND (EUS) RADIAL;  Surgeon: Jola Schmidt, MD;  Location: [facility];  Service: Endoscopy;  Laterality: N/A;  . OOPHORECTOMY    . PORTA CATH INSERTION N/A 11/12/2017   Procedure: Glori Luis  CATH INSERTION;  Surgeon: Algernon Huxley, MD;  Location: [place] [facility];  Service: Cardiovascular;  Laterality: N/A;  . TUBAL LIGATION      MEDICATIONS: . apixaban (ELIQUIS) 5 MG TABS tablet  . fentaNYL (DURAGESIC) 75 MCG/HR  . folic acid (FOLVITE) 1 MG tablet  . gabapentin (NEURONTIN) 300 MG capsule  . lidocaine-prilocaine (EMLA) cream  . lipase/protease/amylase (CREON) 36000 UNITS CPEP capsule  . loperamide (IMODIUM) 2 MG capsule  . losartan (COZAAR) 50 MG tablet  . NARCAN 4 MG/0.1ML LIQD nasal spray kit  . oxyCODONE (ROXICODONE) 15 MG immediate release tablet  . potassium chloride (KLOR-CON SPRINKLE) 10 MEQ CR capsule  . promethazine (PHENERGAN) 12.5 MG tablet   No current facility-administered medications for this encounter.    .  prochlorperazine (COMPAZINE) injection 10 mg    Maia Plan [facility] Pre-Surgical Testing [phone] 05/08/18 10:27 AM

## 2018-05-08 NOTE — Telephone Encounter (Signed)
No, nerve block appt only.

## 2018-05-09 ENCOUNTER — Other Ambulatory Visit: Payer: Self-pay

## 2018-05-09 ENCOUNTER — Other Ambulatory Visit: Payer: Self-pay | Admitting: Interventional Radiology

## 2018-05-09 ENCOUNTER — Encounter (HOSPITAL_COMMUNITY)
Admission: AD | Disposition: A | Discharge status: Home Health | Payer: Self-pay | Source: Home / Self Care | Attending: General Surgery

## 2018-05-09 ENCOUNTER — Ambulatory Visit (HOSPITAL_COMMUNITY): Payer: Medicare Other | Admitting: Anesthesiology

## 2018-05-09 ENCOUNTER — Encounter (HOSPITAL_COMMUNITY): Payer: Self-pay | Admitting: Emergency Medicine

## 2018-05-09 ENCOUNTER — Inpatient Hospital Stay (HOSPITAL_COMMUNITY)
Admission: AD | Admit: 2018-05-09 | Discharge: 2018-05-22 | DRG: 423 | Disposition: A | Discharge status: Home Health | Payer: Medicare Other | Attending: General Surgery | Admitting: General Surgery

## 2018-05-09 ENCOUNTER — Ambulatory Visit (HOSPITAL_COMMUNITY): Payer: Medicare Other | Admitting: Physician Assistant

## 2018-05-09 ENCOUNTER — Telehealth: Payer: Self-pay | Admitting: *Deleted

## 2018-05-09 DIAGNOSIS — K56609 Unspecified intestinal obstruction, unspecified as to partial versus complete obstruction: Secondary | ICD-10-CM

## 2018-05-09 DIAGNOSIS — K311 Adult hypertrophic pyloric stenosis: Secondary | ICD-10-CM | POA: Diagnosis present

## 2018-05-09 DIAGNOSIS — T451X5A Adverse effect of antineoplastic and immunosuppressive drugs, initial encounter: Secondary | ICD-10-CM | POA: Diagnosis present

## 2018-05-09 DIAGNOSIS — C787 Secondary malignant neoplasm of liver and intrahepatic bile duct: Secondary | ICD-10-CM | POA: Diagnosis present

## 2018-05-09 DIAGNOSIS — I10 Essential (primary) hypertension: Secondary | ICD-10-CM | POA: Diagnosis present

## 2018-05-09 DIAGNOSIS — N2889 Other specified disorders of kidney and ureter: Secondary | ICD-10-CM | POA: Diagnosis present

## 2018-05-09 DIAGNOSIS — K259 Gastric ulcer, unspecified as acute or chronic, without hemorrhage or perforation: Secondary | ICD-10-CM | POA: Diagnosis present

## 2018-05-09 DIAGNOSIS — R627 Adult failure to thrive: Secondary | ICD-10-CM | POA: Diagnosis present

## 2018-05-09 DIAGNOSIS — E43 Unspecified severe protein-calorie malnutrition: Secondary | ICD-10-CM | POA: Diagnosis present

## 2018-05-09 DIAGNOSIS — D62 Acute posthemorrhagic anemia: Secondary | ICD-10-CM | POA: Diagnosis not present

## 2018-05-09 DIAGNOSIS — C259 Malignant neoplasm of pancreas, unspecified: Principal | ICD-10-CM | POA: Diagnosis present

## 2018-05-09 DIAGNOSIS — Z79891 Long term (current) use of opiate analgesic: Secondary | ICD-10-CM

## 2018-05-09 DIAGNOSIS — D539 Nutritional anemia, unspecified: Secondary | ICD-10-CM | POA: Diagnosis present

## 2018-05-09 DIAGNOSIS — Z880 Allergy status to penicillin: Secondary | ICD-10-CM

## 2018-05-09 DIAGNOSIS — K863 Pseudocyst of pancreas: Secondary | ICD-10-CM | POA: Diagnosis present

## 2018-05-09 DIAGNOSIS — K910 Vomiting following gastrointestinal surgery: Secondary | ICD-10-CM | POA: Diagnosis not present

## 2018-05-09 DIAGNOSIS — Z886 Allergy status to analgesic agent status: Secondary | ICD-10-CM

## 2018-05-09 DIAGNOSIS — Z9049 Acquired absence of other specified parts of digestive tract: Secondary | ICD-10-CM

## 2018-05-09 DIAGNOSIS — K8681 Exocrine pancreatic insufficiency: Secondary | ICD-10-CM | POA: Diagnosis present

## 2018-05-09 DIAGNOSIS — Z86718 Personal history of other venous thrombosis and embolism: Secondary | ICD-10-CM

## 2018-05-09 DIAGNOSIS — K862 Cyst of pancreas: Secondary | ICD-10-CM | POA: Diagnosis present

## 2018-05-09 DIAGNOSIS — Z7901 Long term (current) use of anticoagulants: Secondary | ICD-10-CM

## 2018-05-09 DIAGNOSIS — D630 Anemia in neoplastic disease: Secondary | ICD-10-CM | POA: Diagnosis present

## 2018-05-09 DIAGNOSIS — E860 Dehydration: Secondary | ICD-10-CM | POA: Diagnosis present

## 2018-05-09 DIAGNOSIS — K429 Umbilical hernia without obstruction or gangrene: Secondary | ICD-10-CM | POA: Diagnosis present

## 2018-05-09 DIAGNOSIS — R011 Cardiac murmur, unspecified: Secondary | ICD-10-CM | POA: Diagnosis present

## 2018-05-09 DIAGNOSIS — K8689 Other specified diseases of pancreas: Secondary | ICD-10-CM | POA: Diagnosis not present

## 2018-05-09 DIAGNOSIS — Z6821 Body mass index (BMI) 21.0-21.9, adult: Secondary | ICD-10-CM

## 2018-05-09 DIAGNOSIS — N179 Acute kidney failure, unspecified: Secondary | ICD-10-CM | POA: Diagnosis present

## 2018-05-09 DIAGNOSIS — D6481 Anemia due to antineoplastic chemotherapy: Secondary | ICD-10-CM | POA: Diagnosis present

## 2018-05-09 DIAGNOSIS — R935 Abnormal findings on diagnostic imaging of other abdominal regions, including retroperitoneum: Secondary | ICD-10-CM | POA: Diagnosis not present

## 2018-05-09 DIAGNOSIS — Z79899 Other long term (current) drug therapy: Secondary | ICD-10-CM

## 2018-05-09 DIAGNOSIS — K644 Residual hemorrhoidal skin tags: Secondary | ICD-10-CM | POA: Diagnosis present

## 2018-05-09 DIAGNOSIS — K648 Other hemorrhoids: Secondary | ICD-10-CM | POA: Diagnosis present

## 2018-05-09 DIAGNOSIS — Z8249 Family history of ischemic heart disease and other diseases of the circulatory system: Secondary | ICD-10-CM

## 2018-05-09 DIAGNOSIS — K3189 Other diseases of stomach and duodenum: Secondary | ICD-10-CM | POA: Diagnosis not present

## 2018-05-09 HISTORY — PX: LAPAROSCOPY: SHX197

## 2018-05-09 HISTORY — DX: Failed or difficult intubation, initial encounter: T88.4XXA

## 2018-05-09 LAB — GLUCOSE, CAPILLARY
Glucose-Capillary: 167 mg/dL — ABNORMAL HIGH (ref 70–99)
Glucose-Capillary: 196 mg/dL — ABNORMAL HIGH (ref 70–99)

## 2018-05-09 LAB — MAGNESIUM: Magnesium: 1.3 mg/dL — ABNORMAL LOW (ref 1.7–2.4)

## 2018-05-09 LAB — PHOSPHORUS: Phosphorus: 3.9 mg/dL (ref 2.5–4.6)

## 2018-05-09 LAB — PREPARE RBC (CROSSMATCH)

## 2018-05-09 SURGERY — LAPAROSCOPY, DIAGNOSTIC
Anesthesia: General | Site: Abdomen

## 2018-05-09 MED ORDER — FENTANYL CITRATE (PF) 250 MCG/5ML IJ SOLN
INTRAMUSCULAR | Status: DC | PRN
  Administered 2018-05-09: 25 ug via INTRAVENOUS
  Administered 2018-05-09: 50 ug via INTRAVENOUS

## 2018-05-09 MED ORDER — LACTATED RINGERS IV SOLN
INTRAVENOUS | Status: DC
  Administered 2018-05-09 (×2): via INTRAVENOUS

## 2018-05-09 MED ORDER — CHLORHEXIDINE GLUCONATE CLOTH 2 % EX PADS: 6.0000 | MEDICATED_PAD | Freq: Once | CUTANEOUS | Status: DC

## 2018-05-09 MED ORDER — SUGAMMADEX SODIUM 200 MG/2ML IV SOLN
INTRAVENOUS | Status: DC | PRN
  Administered 2018-05-09: 200 mg via INTRAVENOUS

## 2018-05-09 MED ORDER — EPHEDRINE 5 MG/ML INJ
INTRAVENOUS | Status: AC
  Filled 2018-05-09: qty 10

## 2018-05-09 MED ORDER — LABETALOL HCL 5 MG/ML IV SOLN
INTRAVENOUS | Status: AC
  Filled 2018-05-09: qty 4

## 2018-05-09 MED ORDER — HYDROMORPHONE HCL 1 MG/ML IJ SOLN
0.5000 mg | INTRAMUSCULAR | Status: DC | PRN
  Administered 2018-05-09 – 2018-05-10 (×4): 1 mg via INTRAVENOUS
  Filled 2018-05-09 (×4): qty 1

## 2018-05-09 MED ORDER — ONDANSETRON HCL 4 MG/2ML IJ SOLN
4.0000 mg | Freq: Four times a day (QID) | INTRAMUSCULAR | Status: DC | PRN
  Administered 2018-05-10 – 2018-05-14 (×9): 4 mg via INTRAVENOUS
  Filled 2018-05-09 (×10): qty 2

## 2018-05-09 MED ORDER — SODIUM CHLORIDE 0.9 % IV SOLN
INTRAVENOUS | Status: DC | PRN
  Administered 2018-05-09: 35 ug/min via INTRAVENOUS

## 2018-05-09 MED ORDER — GABAPENTIN 300 MG PO CAPS
300.0000 mg | ORAL_CAPSULE | ORAL | Status: AC
  Administered 2018-05-09: 300 mg via ORAL
  Filled 2018-05-09: qty 1

## 2018-05-09 MED ORDER — EPHEDRINE SULFATE-NACL 50-0.9 MG/10ML-% IV SOSY
PREFILLED_SYRINGE | INTRAVENOUS | Status: DC | PRN
  Administered 2018-05-09: 10 mg via INTRAVENOUS

## 2018-05-09 MED ORDER — LIDOCAINE 2% (20 MG/ML) 5 ML SYRINGE
INTRAMUSCULAR | Status: DC | PRN
  Administered 2018-05-09: 60 mg via INTRAVENOUS

## 2018-05-09 MED ORDER — PROPOFOL 10 MG/ML IV BOLUS
INTRAVENOUS | Status: AC
  Filled 2018-05-09: qty 60

## 2018-05-09 MED ORDER — FENTANYL CITRATE (PF) 100 MCG/2ML IJ SOLN
25.0000 ug | INTRAMUSCULAR | Status: DC | PRN
  Administered 2018-05-09 (×4): 25 ug via INTRAVENOUS

## 2018-05-09 MED ORDER — ENSURE SURGERY PO LIQD
237.0000 mL | Freq: Two times a day (BID) | ORAL | Status: DC
  Filled 2018-05-09: qty 237

## 2018-05-09 MED ORDER — SUCCINYLCHOLINE CHLORIDE 200 MG/10ML IV SOSY
PREFILLED_SYRINGE | INTRAVENOUS | Status: DC | PRN
  Administered 2018-05-09: 80 mg via INTRAVENOUS

## 2018-05-09 MED ORDER — LIDOCAINE 2% (20 MG/ML) 5 ML SYRINGE
INTRAMUSCULAR | Status: DC | PRN
  Administered 2018-05-09: 1 mg/kg/h via INTRAVENOUS

## 2018-05-09 MED ORDER — PROPOFOL 10 MG/ML IV BOLUS
INTRAVENOUS | Status: AC
  Filled 2018-05-09: qty 20

## 2018-05-09 MED ORDER — KETAMINE HCL 10 MG/ML IJ SOLN
INTRAMUSCULAR | Status: DC | PRN
  Administered 2018-05-09: 20 mg via INTRAVENOUS

## 2018-05-09 MED ORDER — PROMETHAZINE HCL 25 MG PO TABS
12.5000 mg | ORAL_TABLET | Freq: Four times a day (QID) | ORAL | Status: DC | PRN
  Filled 2018-05-09: qty 1

## 2018-05-09 MED ORDER — SCOPOLAMINE 1 MG/3DAYS TD PT72
1.0000 | MEDICATED_PATCH | TRANSDERMAL | Status: AC
  Administered 2018-05-09: 1.5 mg via TRANSDERMAL
  Filled 2018-05-09: qty 1

## 2018-05-09 MED ORDER — INSULIN ASPART 100 UNIT/ML ~~LOC~~ SOLN: 0.0000 [IU] | Freq: Three times a day (TID) | SUBCUTANEOUS | Status: DC

## 2018-05-09 MED ORDER — LIDOCAINE HCL (PF) 1 % IJ SOLN
INTRAMUSCULAR | Status: AC
  Filled 2018-05-09: qty 30

## 2018-05-09 MED ORDER — LIDOCAINE HCL (PF) 1 % IJ SOLN
INTRAMUSCULAR | Status: DC | PRN
  Administered 2018-05-09: 20 mL

## 2018-05-09 MED ORDER — PHENYLEPHRINE 40 MCG/ML (10ML) SYRINGE FOR IV PUSH (FOR BLOOD PRESSURE SUPPORT)
PREFILLED_SYRINGE | INTRAVENOUS | Status: DC | PRN
  Administered 2018-05-09 (×2): 80 ug via INTRAVENOUS

## 2018-05-09 MED ORDER — LIDOCAINE HCL 2 % IJ SOLN
INTRAMUSCULAR | Status: AC
  Filled 2018-05-09: qty 20

## 2018-05-09 MED ORDER — FENTANYL CITRATE (PF) 250 MCG/5ML IJ SOLN
INTRAMUSCULAR | Status: AC
  Filled 2018-05-09: qty 5

## 2018-05-09 MED ORDER — PHENYLEPHRINE 40 MCG/ML (10ML) SYRINGE FOR IV PUSH (FOR BLOOD PRESSURE SUPPORT)
PREFILLED_SYRINGE | INTRAVENOUS | Status: AC
  Filled 2018-05-09: qty 10

## 2018-05-09 MED ORDER — GABAPENTIN 300 MG PO CAPS
300.0000 mg | ORAL_CAPSULE | Freq: Two times a day (BID) | ORAL | Status: DC
  Administered 2018-05-09 – 2018-05-16 (×9): 300 mg via ORAL
  Filled 2018-05-09 (×10): qty 1

## 2018-05-09 MED ORDER — LOPERAMIDE HCL 2 MG PO CAPS
2.0000 mg | ORAL_CAPSULE | Freq: Four times a day (QID) | ORAL | Status: DC | PRN
  Administered 2018-05-18 – 2018-05-21 (×2): 2 mg via ORAL
  Filled 2018-05-09 (×2): qty 1

## 2018-05-09 MED ORDER — LIDOCAINE 2% (20 MG/ML) 5 ML SYRINGE
INTRAMUSCULAR | Status: AC
  Filled 2018-05-09: qty 5

## 2018-05-09 MED ORDER — BUPIVACAINE-EPINEPHRINE 0.25% -1:200000 IJ SOLN
INTRAMUSCULAR | Status: DC | PRN
  Administered 2018-05-09: 20 mL

## 2018-05-09 MED ORDER — DEXAMETHASONE SODIUM PHOSPHATE 10 MG/ML IJ SOLN
INTRAMUSCULAR | Status: DC | PRN
  Administered 2018-05-09: 5 mg via INTRAVENOUS

## 2018-05-09 MED ORDER — ACETAMINOPHEN 650 MG RE SUPP: 650.0000 mg | Freq: Four times a day (QID) | RECTAL | Status: DC | PRN

## 2018-05-09 MED ORDER — ONDANSETRON HCL 4 MG/2ML IJ SOLN
INTRAMUSCULAR | Status: DC | PRN
  Administered 2018-05-09: 4 mg via INTRAVENOUS

## 2018-05-09 MED ORDER — METHOCARBAMOL 500 MG PO TABS: 500.0000 mg | ORAL_TABLET | Freq: Four times a day (QID) | ORAL | Status: DC | PRN

## 2018-05-09 MED ORDER — CIPROFLOXACIN IN D5W 400 MG/200ML IV SOLN
400.0000 mg | INTRAVENOUS | Status: AC
  Administered 2018-05-09: 400 mg via INTRAVENOUS
  Filled 2018-05-09: qty 200

## 2018-05-09 MED ORDER — ACETAMINOPHEN 500 MG PO TABS
1000.0000 mg | ORAL_TABLET | ORAL | Status: AC
  Administered 2018-05-09: 1000 mg via ORAL
  Filled 2018-05-09: qty 2

## 2018-05-09 MED ORDER — ACETAMINOPHEN 325 MG PO TABS
650.0000 mg | ORAL_TABLET | Freq: Four times a day (QID) | ORAL | Status: DC | PRN
  Administered 2018-05-18: 650 mg via ORAL
  Filled 2018-05-09: qty 2

## 2018-05-09 MED ORDER — ONDANSETRON HCL 4 MG/2ML IJ SOLN: 4.0000 mg | Freq: Once | INTRAMUSCULAR | Status: DC | PRN

## 2018-05-09 MED ORDER — OXYCODONE HCL 5 MG PO TABS: 15.0000 mg | ORAL_TABLET | ORAL | Status: DC | PRN

## 2018-05-09 MED ORDER — ONDANSETRON 4 MG PO TBDP: 4.0000 mg | ORAL_TABLET | Freq: Four times a day (QID) | ORAL | Status: DC | PRN

## 2018-05-09 MED ORDER — PANCRELIPASE (LIP-PROT-AMYL) 12000-38000 UNITS PO CPEP
36000.0000 [IU] | ORAL_CAPSULE | Freq: Three times a day (TID) | ORAL | Status: DC
  Administered 2018-05-13 – 2018-05-17 (×6): 36000 [IU] via ORAL
  Filled 2018-05-09 (×3): qty 3
  Filled 2018-05-09: qty 1
  Filled 2018-05-09 (×2): qty 3
  Filled 2018-05-09: qty 2
  Filled 2018-05-09 (×4): qty 3

## 2018-05-09 MED ORDER — ENOXAPARIN SODIUM 40 MG/0.4ML ~~LOC~~ SOLN
40.0000 mg | SUBCUTANEOUS | Status: AC
  Administered 2018-05-11 – 2018-05-13 (×3): 40 mg via SUBCUTANEOUS
  Filled 2018-05-09 (×3): qty 0.4

## 2018-05-09 MED ORDER — NALOXONE HCL 4 MG/0.1ML NA LIQD: 1.0000 | NASAL | Status: DC | PRN

## 2018-05-09 MED ORDER — KCL IN DEXTROSE-NACL 20-5-0.45 MEQ/L-%-% IV SOLN
INTRAVENOUS | Status: AC
  Administered 2018-05-09 – 2018-05-10 (×2): via INTRAVENOUS
  Filled 2018-05-09 (×2): qty 1000

## 2018-05-09 MED ORDER — LIDOCAINE-PRILOCAINE 2.5-2.5 % EX CREA: 1.0000 "application " | TOPICAL_CREAM | CUTANEOUS | Status: DC | PRN

## 2018-05-09 MED ORDER — SUCCINYLCHOLINE CHLORIDE 200 MG/10ML IV SOSY
PREFILLED_SYRINGE | INTRAVENOUS | Status: AC
  Filled 2018-05-09: qty 10

## 2018-05-09 MED ORDER — SUGAMMADEX SODIUM 200 MG/2ML IV SOLN
INTRAVENOUS | Status: AC
  Filled 2018-05-09: qty 2

## 2018-05-09 MED ORDER — KETAMINE HCL 10 MG/ML IJ SOLN
INTRAMUSCULAR | Status: AC
  Filled 2018-05-09: qty 1

## 2018-05-09 MED ORDER — HYDRALAZINE HCL 20 MG/ML IJ SOLN: 10.0000 mg | INTRAMUSCULAR | Status: DC | PRN

## 2018-05-09 MED ORDER — ALBUMIN HUMAN 5 % IV SOLN
INTRAVENOUS | Status: DC | PRN
  Administered 2018-05-09: 09:00:00 via INTRAVENOUS

## 2018-05-09 MED ORDER — PROPOFOL 10 MG/ML IV BOLUS
INTRAVENOUS | Status: DC | PRN
  Administered 2018-05-09: 130 mg via INTRAVENOUS

## 2018-05-09 MED ORDER — DIPHENHYDRAMINE HCL 50 MG/ML IJ SOLN
12.5000 mg | Freq: Four times a day (QID) | INTRAMUSCULAR | Status: DC | PRN
  Administered 2018-05-14 – 2018-05-15 (×2): 12.5 mg via INTRAVENOUS
  Filled 2018-05-09 (×2): qty 1

## 2018-05-09 MED ORDER — FENTANYL CITRATE (PF) 100 MCG/2ML IJ SOLN
INTRAMUSCULAR | Status: AC
  Filled 2018-05-09: qty 2

## 2018-05-09 MED ORDER — FENTANYL 75 MCG/HR TD PT72
1.0000 | MEDICATED_PATCH | TRANSDERMAL | Status: DC
  Administered 2018-05-10 – 2018-05-22 (×5): 1 via TRANSDERMAL
  Filled 2018-05-09 (×5): qty 1

## 2018-05-09 MED ORDER — ONDANSETRON HCL 4 MG/2ML IJ SOLN
INTRAMUSCULAR | Status: AC
  Filled 2018-05-09: qty 2

## 2018-05-09 MED ORDER — CIPROFLOXACIN IN D5W 400 MG/200ML IV SOLN
400.0000 mg | Freq: Two times a day (BID) | INTRAVENOUS | Status: AC
  Administered 2018-05-09: 400 mg via INTRAVENOUS
  Filled 2018-05-09: qty 200

## 2018-05-09 MED ORDER — ROCURONIUM BROMIDE 10 MG/ML (PF) SYRINGE
PREFILLED_SYRINGE | INTRAVENOUS | Status: AC
  Filled 2018-05-09: qty 10

## 2018-05-09 MED ORDER — PROPOFOL 500 MG/50ML IV EMUL
INTRAVENOUS | Status: DC | PRN
  Administered 2018-05-09: 125 ug/kg/min via INTRAVENOUS

## 2018-05-09 MED ORDER — BUPIVACAINE-EPINEPHRINE (PF) 0.25% -1:200000 IJ SOLN
INTRAMUSCULAR | Status: AC
  Filled 2018-05-09: qty 30

## 2018-05-09 MED ORDER — MIDAZOLAM HCL 2 MG/2ML IJ SOLN
INTRAMUSCULAR | Status: AC
  Filled 2018-05-09: qty 2

## 2018-05-09 MED ORDER — JEVITY 1.5 CAL/FIBER PO LIQD
1000.0000 mL | ORAL | Status: DC
  Filled 2018-05-09: qty 1000

## 2018-05-09 MED ORDER — PHENYLEPHRINE HCL 10 MG/ML IJ SOLN
INTRAMUSCULAR | Status: AC
  Filled 2018-05-09: qty 1

## 2018-05-09 MED ORDER — LOSARTAN POTASSIUM 50 MG PO TABS
50.0000 mg | ORAL_TABLET | Freq: Every day | ORAL | Status: DC
  Administered 2018-05-10 – 2018-05-17 (×4): 50 mg via ORAL
  Filled 2018-05-09 (×6): qty 1

## 2018-05-09 MED ORDER — INSULIN ASPART 100 UNIT/ML ~~LOC~~ SOLN
0.0000 [IU] | SUBCUTANEOUS | Status: DC
  Administered 2018-05-10: 2 [IU] via SUBCUTANEOUS
  Administered 2018-05-10 (×5): 1 [IU] via SUBCUTANEOUS
  Administered 2018-05-11: 2 [IU] via SUBCUTANEOUS
  Administered 2018-05-12 – 2018-05-22 (×23): 1 [IU] via SUBCUTANEOUS

## 2018-05-09 MED ORDER — FOLIC ACID 1 MG PO TABS
1.0000 mg | ORAL_TABLET | Freq: Every day | ORAL | Status: DC
  Administered 2018-05-10 – 2018-05-17 (×5): 1 mg via ORAL
  Filled 2018-05-09 (×7): qty 1

## 2018-05-09 MED ORDER — VASOPRESSIN 20 UNIT/ML IV SOLN
INTRAVENOUS | Status: AC
  Filled 2018-05-09: qty 1

## 2018-05-09 MED ORDER — DEXAMETHASONE SODIUM PHOSPHATE 10 MG/ML IJ SOLN
INTRAMUSCULAR | Status: AC
  Filled 2018-05-09: qty 1

## 2018-05-09 MED ORDER — DIPHENHYDRAMINE HCL 12.5 MG/5ML PO ELIX: 12.5000 mg | ORAL_SOLUTION | Freq: Four times a day (QID) | ORAL | Status: DC | PRN

## 2018-05-09 MED ORDER — LABETALOL HCL 5 MG/ML IV SOLN
10.0000 mg | Freq: Once | INTRAVENOUS | Status: AC
  Administered 2018-05-09: 10 mg via INTRAVENOUS

## 2018-05-09 MED ORDER — DEXAMETHASONE SODIUM PHOSPHATE 4 MG/ML IJ SOLN: 4.0000 mg | INTRAMUSCULAR | Status: DC

## 2018-05-09 MED ORDER — ROCURONIUM BROMIDE 50 MG/5ML IV SOSY
PREFILLED_SYRINGE | INTRAVENOUS | Status: DC | PRN
  Administered 2018-05-09: 40 mg via INTRAVENOUS

## 2018-05-09 MED ORDER — OSMOLITE 1.2 CAL PO LIQD
1000.0000 mL | ORAL | Status: DC
  Administered 2018-05-09 – 2018-05-11 (×3): 1000 mL
  Filled 2018-05-09 (×3): qty 1000

## 2018-05-09 MED ORDER — 0.9 % SODIUM CHLORIDE (POUR BTL) OPTIME
TOPICAL | Status: DC | PRN
  Administered 2018-05-09: 1000 mL

## 2018-05-09 MED ORDER — LACTATED RINGERS IV SOLN
INTRAVENOUS | Status: DC | PRN
  Administered 2018-05-09: 07:00:00 via INTRAVENOUS

## 2018-05-09 MED ORDER — POTASSIUM CHLORIDE CRYS ER 20 MEQ PO TBCR
20.0000 meq | EXTENDED_RELEASE_TABLET | Freq: Every day | ORAL | Status: DC
  Administered 2018-05-13 – 2018-05-17 (×3): 20 meq via ORAL
  Filled 2018-05-09 (×7): qty 1

## 2018-05-09 SURGICAL SUPPLY — 44 items
BENZOIN TINCTURE PRP APPL 2/3 (GAUZE/BANDAGES/DRESSINGS) IMPLANT
CLOSURE WOUND 1/2 X4 (GAUZE/BANDAGES/DRESSINGS)
COVER SURGICAL LIGHT HANDLE (MISCELLANEOUS) ×3 IMPLANT
COVER WAND RF STERILE (DRAPES) IMPLANT
DECANTER SPIKE VIAL GLASS SM (MISCELLANEOUS) ×6 IMPLANT
DERMABOND ADVANCED (GAUZE/BANDAGES/DRESSINGS) ×2
DERMABOND ADVANCED .7 DNX12 (GAUZE/BANDAGES/DRESSINGS) ×1 IMPLANT
DRSG TELFA 3X8 NADH (GAUZE/BANDAGES/DRESSINGS) ×3 IMPLANT
ELECT PENCIL ROCKER SW 15FT (MISCELLANEOUS) ×3 IMPLANT
ELECT REM PT RETURN 15FT ADLT (MISCELLANEOUS) ×3 IMPLANT
GLOVE BIO SURGEON STRL SZ 6 (GLOVE) ×3 IMPLANT
GLOVE INDICATOR 6.5 STRL GRN (GLOVE) ×6 IMPLANT
GOWN SRG 2XL LVL 4 BRTHBL STRL (GOWNS) ×1 IMPLANT
GOWN STRL NON-REIN 2XL LVL4 (GOWNS) ×2
GOWN STRL REUS W/ TWL XL LVL3 (GOWN DISPOSABLE) ×3 IMPLANT
GOWN STRL REUS W/TWL 2XL LVL3 (GOWN DISPOSABLE) ×3 IMPLANT
GOWN STRL REUS W/TWL XL LVL3 (GOWN DISPOSABLE) ×6
IRRIG SUCT STRYKERFLOW 2 WTIP (MISCELLANEOUS)
IRRIGATION SUCT STRKRFLW 2 WTP (MISCELLANEOUS) IMPLANT
KIT BASIN OR (CUSTOM PROCEDURE TRAY) ×3 IMPLANT
KIT TURNOVER KIT A (KITS) IMPLANT
L-HOOK LAP DISP 36CM (ELECTROSURGICAL) ×3
LHOOK LAP DISP 36CM (ELECTROSURGICAL) ×1 IMPLANT
SET TUBE SMOKE EVAC HIGH FLOW (TUBING) ×3 IMPLANT
SHEARS HARMONIC ACE PLUS 36CM (ENDOMECHANICALS) IMPLANT
SLEEVE XCEL OPT CAN 5 100 (ENDOMECHANICALS) ×6 IMPLANT
SOLUTION ANTI FOG 6CC (MISCELLANEOUS) ×3 IMPLANT
SPONGE DRAIN TRACH 4X4 STRL 2S (GAUZE/BANDAGES/DRESSINGS) ×3 IMPLANT
STRIP CLOSURE SKIN 1/2X4 (GAUZE/BANDAGES/DRESSINGS) IMPLANT
SUT ETHILON 2 0 PS N (SUTURE) ×6 IMPLANT
SUT PDS AB 0 CT 36 (SUTURE) ×6 IMPLANT
SUT SILK 3 0 SH CR/8 (SUTURE) ×6 IMPLANT
SUT VIC AB 3-0 SH 27 (SUTURE) ×2
SUT VIC AB 3-0 SH 27X BRD (SUTURE) ×1 IMPLANT
SUT VIC AB 4-0 PS2 27 (SUTURE) IMPLANT
SYR BULB IRRIGATION 50ML (SYRINGE) ×3 IMPLANT
TOWEL OR 17X26 10 PK STRL BLUE (TOWEL DISPOSABLE) ×9 IMPLANT
TRAY LAPAROSCOPIC (CUSTOM PROCEDURE TRAY) ×3 IMPLANT
TROCAR XCEL BLUNT TIP 100MML (ENDOMECHANICALS) ×3 IMPLANT
TROCAR XCEL NON-BLD 11X100MML (ENDOMECHANICALS) IMPLANT
TROCAR XCEL UNIV SLVE 11M 100M (ENDOMECHANICALS) IMPLANT
TUBE J 18FR (TUBING) ×3 IMPLANT
TUBE JEJUNOSTOMY 16FR 108IN ST (CATHETERS) ×3 IMPLANT
WATER STERILE IRR 1000ML POUR (IV SOLUTION) ×3 IMPLANT

## 2018-05-09 NOTE — Progress Notes (Addendum)
Initial Nutrition Assessment  DOCUMENTATION CODES:   Not applicable  INTERVENTION:   Start trickle feedings: Osmolite 1.2 @ 15 ml/hr via J-tube Advance as tolerated to goal rate of 50 ml/hr (1200 ml)  At goal rate TF provides: 1440 kcal, 67 grams of protein, and 984 ml free water. Meets 78% of calorie needs and 75% of protein needs.   Will monitor for PO tolerance and decrease rate as needed.   NUTRITION DIAGNOSIS:   Increased nutrient needs related to post-op healing, cancer and cancer related treatments as evidenced by estimated needs.  GOAL:   Patient will meet greater than or equal to 90% of their needs  MONITOR:   PO intake, Supplement acceptance, Diet advancement, Weight trends, I & O's, Labs, TF tolerance  REASON FOR ASSESSMENT:   Consult Enteral/tube feeding initiation and management  ASSESSMENT:   Patient with PMH significant for pancreatic cancer (diagnosed 10/2017) on chemotherapy, renal mass, HTN, severe malnutrition, CKD III, and ventral hernia without obstruction. 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    3/19- laparoscopy, J-tube placement, and repair of umbilical hernia   Unable to see pt today as she was in surgery this afternoon. Will start pt on trickle feeds Osmolite 1.2 @ 15 ml/hr and advance as tolerated. RD to leave recommendations to meet 75% of needs per surgery request. Full assessment to follow tomorrow.   Pt seen 3/11 by outpatient oncology. Found to be severely malnourished. Suspect malnutrition continues.   Medications reviewed and include: folic acid, creon 81L-57W TID, 20 mEq KCl once daily Labs reviewed.   NUTRITION - FOCUSED PHYSICAL EXAM:  Unable to complete at this time.  Diet Order:   Diet Order            Diet clear liquid Room service appropriate? Yes; Fluid consistency: Thin  Diet effective now              EDUCATION NEEDS:   Not appropriate for education at this time  Skin:  Skin  Assessment: Skin Integrity Issues: Skin Integrity Issues:: Incisions Incisions: closed abdomen  Last BM:  PTA  Height:   Ht Readings from Last 1 Encounters:  05/09/18 5\' 3"  (1.6 m)    Weight:   Wt Readings from Last 1 Encounters:  05/09/18 54.9 kg    Ideal Body Weight:  52.3 kg  BMI:  Body mass index is 21.43 kg/m.  Estimated Nutritional Needs:   Kcal:  1650-1850 kcal  Protein:  80-100 grams  Fluid:  >/= 1.6 L/day  Mariana Single RD, LDN Clinical Nutrition Pager # - [phone]

## 2018-05-09 NOTE — Telephone Encounter (Signed)
I would imaging the surgeon is going to come by and talk to them.

## 2018-05-09 NOTE — Consult Note (Signed)
Chief Complaint: Patient was seen in consultation today for severe epigastric pain at the request of Finnegan,Timothy J  Referring Physician(s): Finnegan,Timothy J  History of Present Illness: Cindy Bowman is a 68 y.o. female was diagnosed with pancreatic carcinoma in September 2019.  She had been having back and epigastric pain.  She had cholecystectomy in May but back pain was persistent and intractable.  CT showed renal and pancreatic masses.  The pancreatic mass potentially involves SMA and SMV.  PET scan negative for metastatic disease.  Left renal lesion suspicious for renal cell carcinoma.  Endoscopic biopsy positive for adenocarcinoma.  No nausea or vomiting.  20 pound weight loss over the past 2 months.  Poor appetite.  Decline in energy level.  She uses oxycodone as needed and fentanyl patch for pain.  She rates her pain as 10 out of 10 on the visual analog pain scale, improved to 4-5 out of 10 when the pain medicine is working at his best. Undergoing neoadjuvant chemotherapy.  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 She presents for discussion of pain control options specifically celiac plexus block/neurolysis. Past Medical History:  Diagnosis Date  . Anemia   . Complication of anesthesia   . Difficult airway for intubation    2018 Oophorectomy, CRNA unable to visualized vocal cords with MAC 3 bladed. Easy ventilations O2 sats 99%. Rolls placed under the shoulders and pillows under the head Repeat Laryngoscopy with MAC 3 blade > Visualization not much better. O2 sats 99%. Intubation with Glidescope on the Second Attempt. BBSE good ETCO 2 O 2 sats 99%.   . Essential hypertension   . Pancreatic cancer ([place])   . PONV (postoperative nausea and vomiting)   . UTI (urinary tract infection)     Past Surgical History:  Procedure Laterality Date  . CESAREAN SECTION    . CHOLECYSTECTOMY    . EUS N/A 10/25/2017   Procedure: FULL UPPER ENDOSCOPIC ULTRASOUND (EUS)  RADIAL;  Surgeon: Jola Schmidt, MD;  Location: [facility];  Service: Endoscopy;  Laterality: N/A;  . IR RADIOLOGIST EVAL & MGMT  05/08/2018  . OOPHORECTOMY    . PORTA CATH INSERTION N/A 11/12/2017   Procedure: PORTA CATH INSERTION;  Surgeon: Algernon Huxley, MD;  Location: [place] [facility];  Service: Cardiovascular;  Laterality: N/A;  . TUBAL LIGATION      Allergies: Nsaids and Penicillins  Medications: Prior to Admission medications   Medication Sig Start Date End Date Taking? Authorizing Provider  apixaban (ELIQUIS) 5 MG TABS tablet Take 1 tablet (5 mg total) by mouth 2 (two) times daily. 05/03/18   Lloyd Huger, MD  fentaNYL (DURAGESIC) 75 MCG/HR Place 1 patch onto the skin every 3 (three) days. 04/30/18   Borders, Kirt Boys, NP  folic acid (FOLVITE) 1 MG tablet TAKE 1 TABLET BY MOUTH EVERY DAY Patient taking differently: Take 1 mg by mouth daily.  04/12/18   Verlon Au, NP  gabapentin (NEURONTIN) 300 MG capsule Take 1 capsule (300 mg total) by mouth at bedtime. 05/01/18   Borders, Kirt Boys, NP  lidocaine-prilocaine (EMLA) cream Apply 1 application topically as needed. Patient taking differently: Apply 1 application topically as needed (port access).  04/30/18   Lloyd Huger, MD  lipase/protease/amylase (CREON) 36000 UNITS CPEP capsule Take 16,384-53,646 Units by mouth See admin instructions. Take 36000 units with small meals and 72000 units with big meals    [provider]  loperamide (IMODIUM) 2  MG capsule Take 2 mg by mouth as needed for diarrhea or loose stools.     [provider]  losartan (COZAAR) 50 MG tablet Take 1 tablet (50 mg total) by mouth daily. 11/19/17   Vaughan Basta, MD  NARCAN 4 MG/0.1ML LIQD nasal spray kit Place 1 spray into the nose as needed (opioid overdose).  03/21/18   [provider]  oxyCODONE (ROXICODONE) 15 MG immediate release tablet Take 1-2 tablets (15-30 mg total) by mouth every 4 (four) hours as needed  for pain. 05/03/18   Borders, Kirt Boys, NP  potassium chloride (KLOR-CON SPRINKLE) 10 MEQ CR capsule Take 2 capsules (20 mEq total) by mouth daily. May open capsule to sprinkle on food. 04/30/18   Lloyd Huger, MD  promethazine (PHENERGAN) 12.5 MG tablet Take 12.5 mg by mouth every 6 (six) hours as needed for nausea or vomiting.    [provider]     Family History  Problem Relation Age of Onset  . Hypertension Mother        deceased 62  . Heart attack Father        deceased late 39s    Social History   Socioeconomic History  . Marital status: Single    Spouse name: Not on file  . Number of children: Not on file  . Years of education: Not on file  . Highest education level: Not on file  Occupational History  . Not on file  Social Needs  . Financial resource strain: Not on file  . Food insecurity:    Worry: Not on file    Inability: Not on file  . Transportation needs:    Medical: No    Non-medical: No  Tobacco Use  . Smoking status: Never Smoker  . Smokeless tobacco: Never Used  Substance and Sexual Activity  . Alcohol use: Not Currently  . Drug use: Never  . Sexual activity: Not Currently  Lifestyle  . Physical activity:    Days per week: Not on file    Minutes per session: Not on file  . Stress: Not on file  Relationships  . Social connections:    Talks on phone: Not on file    Gets together: Not on file    Attends religious service: Not on file    Active member of club or organization: Not on file    Attends meetings of clubs or organizations: Not on file    Relationship status: Not on file  Other Topics Concern  . Not on file  Social History Narrative  . Not on file    ECOG Status: 2 - Symptomatic, <50% confined to bed      Physical Exam  Constitutional: Oriented to person, place, and time. Well-developed and well-nourished. No distress.  Last Weight  Most recent update: 05/08/2018  1:24 PM   Weight  54.9 kg (121 lb)            HENT:  Head: Normocephalic and atraumatic.  Eyes: Conjunctivae and EOM are normal. Right eye exhibits no discharge. Left eye exhibits no discharge. No scleral icterus.  Neck: No JVD present.  Pulmonary/Chest: Effort normal. No stridor. No respiratory distress.  Abdomen: soft, non distended Neurological:  alert and oriented to person, place, and time.  Skin: Skin is warm and dry.  not diaphoretic.  Psychiatric:   normal mood and affect.   behavior is normal. Judgment and thought content normal.   Vital Signs: BP (!) 132/58   Pulse 89  Temp 98.7 F (37.1 C) (Oral)   Resp 14   Ht _0  (1.6 m)   Wt 54.9 kg   SpO2 100%   BMI 21.43 kg/m  Review of SystemsReview of Systems: A 12 point ROS discussed and pertinent positives are indicated in the HPI above.  All other systems are negative.  Mallampati Score:     Imaging:  CT ABDOMEN AND PELVIS WITH CONTRAST  TECHNIQUE: Multidetector CT imaging of the abdomen and pelvis was performed using the standard protocol following bolus administration of intravenous contrast.  CONTRAST: 60m OMNIPAQUE IOHEXOL 300 MG/ML SOLN  COMPARISON: 03/10/2018  FINDINGS: Lower chest: Left-sided pleural effusion is stable. Bibasilar mild atelectatic changes are again noted.  Hepatobiliary: The liver is diffusely decreased in attenuation consistent with fatty infiltration. The gallbladder has been surgically removed. No focal mass lesion in the liver is seen.  Pancreas: There is a focal hypodense mass lesion with peripheral enhancement identified within the uncinate process of the pancreas consistent with the patient's given clinical history of pancreatic carcinoma. Adjacent fiducial markers are seen. The lesion abuts the superior mesenteric artery consistent with neoplasm and vascular encasement. Occupying the majority of the body of the pancreas there is a an ovoid fluid collection which measures 9.8 x 5.0 cm. This is consistent with a  pancreatic pseudocyst related to the necrotizing pancreatitis seen on the prior exam. Peripheral enhancement is noted. The tail of the pancreas appears within normal limits. A few smaller peripancreatic fluid collections are identified consistent with pseudocysts. The largest of these smaller fluid collections is seen on image number 17 of series 2 in the splenic hilum measuring 3.5 cm in greatest dimension.  Spleen: Normal in size without focal abnormality.  Adrenals/Urinary Tract: Adrenal glands are within normal limits. Kidneys are well visualized bilaterally. An enhancing mass lesion arising from the left kidney is again noted and stable. This did not demonstrate hypermetabolic nature on prior PET-CT. It again measures approximately 3.1 cm in dimension. No obstructive changes are seen. The bladder is decompressed. The known periurethral diverticulum on the right is less well appreciated due to the lack of contrast opacification.  Stomach/Bowel: Diverticular change of the colon is noted without evidence of diverticulitis. The appendix is within normal limits. No obstructive change in the small bowel is identified.  Vascular/Lymphatic: No significant vascular findings are present. No enlarged abdominal or pelvic lymph nodes.  Reproductive: Uterus is within normal limits. No adnexal mass is seen.  Other: Free fluid is noted within the pelvis as well as adjacent to the liver and spleen relatively stable from the previous exam. Fat containing periumbilical hernia is again seen and stable.  Musculoskeletal: Degenerative changes of lumbar spine are noted. No definitive lytic or sclerotic lesions to suggest bony metastatic disease are seen. No compression deformities are noted.  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 Smaller pseudocysts are  noted adjacent to the tail of the pancreas and spleen.  Peripherally enhancing uncinate process mass consistent with the known history of pancreatic carcinoma.  Stable ascites within the abdomen and pelvis.  Stable left renal mass.   Electronically Signed By: MInez CatalinaM.D. On: 04/04/2018 23:20   Dg Thoracic Spine 2 View  Result Date: 05/01/2018 CLINICAL DATA:  Pancreatic cancer.  Back pain. EXAM: THORACIC SPINE 2 VIEWS COMPARISON:  Chest radiography 03/10/2018 FINDINGS: Very minimal spinal curvature. No significant disc space narrowing. Ordinary  marginal osteophytes. No evidence of fracture or destructive lesion. Posteromedial ribs appear normal. IMPRESSION: No definitely significant finding. Ordinary age related changes. Ordinary marginal osteophytes. No sign of fracture or destructive lesion. Electronically Signed   By: Nelson Chimes M.D.   On: 05/01/2018 09:11   Ir Radiologist Eval & Mgmt  Result Date: 05/08/2018 Please refer to notes tab for details about interventional procedure. (Op Note)   Labs:  CBC: Recent Labs    04/23/18 0905 04/30/18 1027 05/06/18 0918 05/07/18 0920  WBC 7.7 8.9 7.9 8.2  HGB 8.5* 9.0* 8.9* 9.5*  HCT 26.3* 27.8* 27.3* 30.8*  PLT 307 287 298 316    COAGS: Recent Labs    05/07/18 0920  INR 1.2    BMP: Recent Labs    04/23/18 0905 04/30/18 1027 05/06/18 0918 05/07/18 0920  NA 138 137 135 138  K 3.3* 3.0* 3.4* 4.0  CL 105 106 106 105  CO2 21* 20* 21* 21*  GLUCOSE 87 99 97 91  BUN 6* 9 8 7*  CALCIUM 8.1* 8.4* 8.2* 8.8*  CREATININE 0.94 1.06* 1.11* 1.04*  GFRNONAA >60 54* 51* 55*  GFRAA >60 >60 59* >60    LIVER FUNCTION TESTS: Recent Labs    04/23/18 0905 04/30/18 1027 05/06/18 0918 05/07/18 0920  BILITOT 0.9 1.0 1.0 1.0  AST _0 ALT _1 ALKPHOS 88 97 87 96  PROT 7.1 7.7 7.4 8.2*  ALBUMIN 2.4* 2.5* 2.3* 2.8*    TUMOR MARKERS: No results for input(s): AFPTM, CEA, CA199, CHROMGRNA in the last 8760  hours.  Assessment and Plan:  My impression is that this patient epigastric pain is almost certainly related to her invasive pancreatic carcinoma, with superimposed necrotizing pancreatitis.  Her pain is poorly controlled despite a very aggressive narcotic pain medication regimen.  She is an appropriate candidate for consideration of celiac plexus block and neurolysis. Her CT scan shows that she is anatomically approachable for percutaneous celiac plexus block.  I discussed with the patient and family member the etiology of visceral pain and mediation by the celiac plexus (AKA solar plexus).  We discussed treatment options .  We discussed in detail the CT-guided celiac plexus block and neural lysis.  We discussed anticipated benefits, possible risks and complications, time course of symptom resolution, and alternatives.  She and her family member seemed to understand and did ask appropriate questions.  She is motivated to proceed. Accordingly, we can set this up at her convenience as an outpatient under moderate sedation in CT.  They are closest to [facility] so we can try to set it up there. Thank you for this interesting consult.  I greatly enjoyed meeting Shakeitha Umbaugh and look forward to participating in their care.  A copy of this report was sent to the requesting provider on this date.  Electronically Signed: Rickard Rhymes 05/09/2018, 11:58 AM   I spent a total of  40 Minutes   in face to face in clinical consultation, greater than 50% of which was counseling/coordinating care for epigastric pain treated with celiac plexus block.

## 2018-05-09 NOTE — H&P (Signed)
Cindy Bowman Documented: 05/03/2018 11:44 AM Location: [facility] Patient #: [id number] DOB: [birth year] Single / Language: Cleophus Molt / Race: Black or African American Female   History of Present Illness Cindy Klein MD; 05/03/2018 1:28 PM) The patient is a 68 year old female who presents for a follow-up for Pancreatic cancer. Patient is a 68 year old female referred by Dr. Grayland Ormond for a new diagnosis of pancreatic cancer 10/2017. She has been having back pain since April of this year. She also had some epigastric pain. She had her gallbladder out in May which was felt to most likely take care of her pain. However, she continued to have mid back pain that was intractable. She underwent a renal stone study in August that showed a renal mass as well as a pancreatic mass. This was followed up by a CT scan with contrast confirming an uncinate mass with potential involvement of the SMA and SMV. A PET scan was negative for metastatic disease. She was also seen to have a mass suspicious for LEFT renal cell cancer. She denied nausea and vomiting. She had significant weight loss of 20 pounds over the past 2 months. She does not have a significant appetite. She has had a decline in her energy level. She has required a fentanyl patch for pain. She does not have any family history of cancer or pancreatitis of which she is aware. She has not had any alcohol use throughout her life. 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 She has had an endoscopic ultrasound with biopsy which confirmed diagnosis of adenocarcinoma.   She has been undergoing neoadjuvant chemotherapy with Dr. Grayland Ormond. Restaging scan was done 12/27. The uncinate process mass was unchanged significantly. She did not have true encasement of vessels or displacement, but still no fat plane around SMA. She then got necrotizing pancreatitis in january and this delayed  plans for SBRT. (? around fiducial placement). She has completed SBRT. She has continued to lose weight (119 from 155). Albumin is down to 2.5. She reports low appetite and occasional vomiting after she eats.    Ct 02/15/18 IMPRESSION: 1. No significant change in 2.7 cm pancreatic uncinate process mass since 12/12/2017. No evidence of distant metastases. 2. Unchanged 3 cm LEFT renal mass highly suspicious for renal cell carcinoma. 3. No new or significant changes from the prior study.   pathology 10/25/2017 Cytology - Non PAP CASE: [id number] SPECIMEN SUBMITTED: A. Pancreatic head mass; FNA DIAGNOSIS: A. PANCREATIC HEAD MASS; EUS ASSISTED FNA: - POSITIVE FOR MALIGNANCY. - ADENOCARCINOMA IS PRESENT. - SPECIMEN IS ADEQUATE FOR EVALUATION.  CT abd/pelvis 10/17/2017 IMPRESSION: 1. Hypoenhancing 3.0 cm mass of the uncinate process of the pancreas. The lesion abuts the SMV, SMA, and left renal vein without true encasement at this time. Appearance suspicious for pancreatic adenocarcinoma, focal pancreatitis considered a less likely differential diagnostic consideration. Metastatic disease from the left renal mass is likewise considered less likely given the difference in overall characteristics between the pancreatic and renal lesions. Further characteristics of the pancreatic tumor are enumerated above. 2. There is also a 2.8 cm enhancing exophytic mass of the left mid kidney compatible with renal cell carcinoma. No tumor thrombus in the left renal vein or adjacent periaortic adenopathy. 3. No evidence of hepatic or other metastatic lesions. 4. Other imaging findings of potential clinical significance: Diffuse hepatic steatosis. Descending and sigmoid colon diverticulosis. Trace free pelvic fluid. Umbilical hernia contains adipose tissue.  PET  10/30/2017 IMPRESSION: 1. 3 cm pancreatic head mass is hypermetabolic and consistent with known pancreatic adenocarcinoma. No  surrounding adenopathy or evidence of hepatic metastatic disease. 2. No findings for metastatic disease involving the chest or bony structures. 3. 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  CA 19-9: 78 normal T bili, albumin 4.1  EUS 10/25/2017 EUS: - A mass was identified in the uncinate process of the pancreas. Tissue was obtained from this exam, and results are pending. However, the endosonographic appearance is suggestive of adenocarcinoma. This was staged T4 N1 Mx by endosonographic criteria. The staging applies if malignancy is confirmed. Fine needle biopsy performed. - There was no sign of significant pathology in the common bile duct and in the common hepatic duct. - One abnormal lymph node was visualized in the peripancreatic region. Tissue has not been obtained. However, the endosonographic appearance is suggestive of metastatic pancreatic adenocarcinoma. Impression: - Patient has a contact number available for emergencies. The signs and symptoms of   Allergies (April Staton, CMA; 05/03/2018 12:02 PM) NSAIDs  Penicillins  Ibuprofen *ANALGESICS - ANTI-INFLAMMATORY*   Medication History (April Staton, CMA; 05/03/2018 12:05 PM) Eliquis (5MG  Tablet, Oral) Active. fentaNYL (25MCG/HR Patch 72HR, Transdermal) Active. Folic Acid (Oral) Specific strength unknown - Active. Gabapentin (300MG  Capsule, Oral) Active. Losartan Potassium-HCTZ (50-12.5MG  Tablet, Oral) Active. Megace (40MG  Tablet, Oral) Active. Narcan (4MG /0.1ML Liquid, Nasal) Active. Zofran (4MG  Tablet, Oral) Active. oxyCODONE HCl (10MG  Tablet, Oral) Active. Potassium Chloride ER (10MEQ Capsule ER, Oral) Active. Compazine (10MG  Tablet, Oral) Active. Medications Reconciled    Review of Systems Cindy Klein MD; 05/03/2018 11:53 AM) All other systems negative  Vitals (April Staton CMA; 05/03/2018 12:07 PM) 05/03/2018 12:05  PM Weight: 151.94 lb Height: 63in Body Surface Area: 1.72 m Body Mass Index: 26.91 kg/m  Temp.: 98.60F(Oral)  Pulse: 90 (Regular)  BP: 140/84 (Sitting, Left Arm, Standard)       Physical Exam Cindy Klein MD; 05/03/2018 1:29 PM) General Mental Status-Alert. General Appearance-Consistent with stated age. Hydration-Well hydrated. Voice-Normal.  Head and Neck Head-normocephalic, atraumatic with no lesions or palpable masses.  Eye Sclera/Conjunctiva - Bilateral-No scleral icterus.  Chest and Lung Exam Chest and lung exam reveals -quiet, even and easy respiratory effort with no use of accessory muscles. Inspection Chest Wall - Normal. Back - normal.  Breast - Did not examine.  Cardiovascular Cardiovascular examination reveals -normal pedal pulses bilaterally. Note: regular rate and rhythm  Abdomen Inspection-Inspection Normal. Palpation/Percussion Palpation and Percussion of the abdomen reveal - Soft, Non Tender, No Rebound tenderness, No Rigidity (guarding) and No hepatosplenomegaly.  Peripheral Vascular Upper Extremity Inspection - Bilateral - Normal - No Clubbing, No Cyanosis, No Edema, Pulses Intact. Lower Extremity Palpation - Edema - Bilateral - No edema.  Neurologic Neurologic evaluation reveals -alert and oriented x 3 with no impairment of recent or remote memory. Mental Status-Normal.  Musculoskeletal Global Assessment -Note: no gross deformities.  Normal Exam - Left-Upper Extremity Strength Normal and Lower Extremity Strength Normal. Normal Exam - Right-Upper Extremity Strength Normal and Lower Extremity Strength Normal.  Lymphatic Head & Neck  General Head & Neck Lymphatics: Bilateral - Description - Normal. Axillary  General Axillary Region: Bilateral - Description - Normal. Tenderness - Non Tender.    Assessment & Plan Cindy Klein MD; 05/03/2018 1:30 PM) ADENOCARCINOMA OF HEAD OF PANCREAS  (C25.0) Impression: I am concerned about her fitness for surgery given continued weight loss. I will get prealbumin today. Will refer again to  nutrition at cancer center.  Will place J tube at time of dx laparoscopy to help her build up weight. Current Plans IMMUNASSAY TUMOR Dartha Lodge; CA 19-9 [phone]) Follow up with [place] in the office in 2 weeks in [place] [facility].  Call us sooner as needed.  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 It is possible that the patient may have an element of gastric outlet obstruction from the pseudocyst, especially if it has enlarged and not gotten smaller. Current Plans CT ABDOMEN PELVIS W CON (42683) (pancreatic protocol.) Pt Education - [facility] Free Text Education/Instructions: discussed with patient and provided information.   Signed by Cindy Klein, MD (05/03/2018 1:30 PM)

## 2018-05-09 NOTE — Transfer of Care (Signed)
Immediate Anesthesia Transfer of Care Note  Patient: Cindy Bowman  Procedure(s) Performed: LAPAROSCOPY DIAGNOSTIC AND J TUBE PLACEMENT (FEEDING TUBE) (N/A Abdomen)  Patient Location: PACU  Anesthesia Type:General  Level of Consciousness: awake, alert  and oriented  Airway & Oxygen Therapy: Patient Spontanous Breathing and Patient connected to face mask oxygen  Post-op Assessment: Report given to RN and Post -op Vital signs reviewed and stable  Post vital signs: Reviewed and stable  Last Vitals:  Vitals Value Taken Time  BP 168/75 05/09/2018  9:30 AM  Temp    Pulse 97 05/09/2018  9:32 AM  Resp 14 05/09/2018  9:32 AM  SpO2 100 % 05/09/2018  9:32 AM  Vitals shown include unvalidated device data.  Last Pain:  Vitals:   05/09/18 0604  TempSrc:   PainSc: 7       Patients Stated Pain Goal: 4 (88/28/00 3491)  Complications: No apparent anesthesia complications

## 2018-05-09 NOTE — Telephone Encounter (Signed)
Patient in hospital and has Laparotomy done showing 2 liver lesions. He would like for you to call him and discuss so he can explain to her when she wakes up. [phone]

## 2018-05-09 NOTE — Interval H&P Note (Signed)
History and Physical Interval Note:  05/09/2018 7:32 AM  Cindy Bowman  has presented today for surgery, with the diagnosis of PANCREATIC CANCER SEVERE PROTEIN CALORIE MALNUTRITION.  The various methods of treatment have been discussed with the patient and family. After consideration of risks, benefits and other options for treatment, the patient has consented to  Procedure(s): LAPAROSCOPY DIAGNOSTIC AND J TUBE PLACEMENT (FEEDING TUBE) (N/A) as a surgical intervention.  The patient's history has been reviewed, patient examined, no change in status, stable for surgery.  I have reviewed the patient's chart and labs.  Questions were answered to the patient's satisfaction.     Stark Klein

## 2018-05-09 NOTE — Anesthesia Postprocedure Evaluation (Signed)
Anesthesia Post Note  Patient: Cindy Bowman  Procedure(s) Performed: LAPAROSCOPY DIAGNOSTIC AND J TUBE PLACEMENT (FEEDING TUBE) (N/A Abdomen)     Patient location during evaluation: PACU Anesthesia Type: General Level of consciousness: awake and alert Pain management: pain level controlled Vital Signs Assessment: post-procedure vital signs reviewed and stable Respiratory status: spontaneous breathing, nonlabored ventilation and respiratory function stable Cardiovascular status: blood pressure returned to baseline and stable Postop Assessment: no apparent nausea or vomiting Anesthetic complications: no    Last Vitals:  Vitals:   05/09/18 1133 05/09/18 1212  BP: (!) 158/84 (!) 156/76  Pulse: 83 83  Resp: 14 14  Temp: 36.6 C 36.8 C  SpO2: 100% 100%    Last Pain:  Vitals:   05/09/18 1212  TempSrc: Oral  PainSc:                  Catalina Gravel

## 2018-05-09 NOTE — Anesthesia Procedure Notes (Signed)
Procedure Name: Intubation Date/Time: 05/09/2018 7:49 AM Performed by: Maxwell Caul, CRNA Pre-anesthesia Checklist: Patient identified, Emergency Drugs available, Suction available and Patient being monitored Patient Re-evaluated:Patient Re-evaluated prior to induction Oxygen Delivery Method: Circle system utilized Preoxygenation: Pre-oxygenation with 100% oxygen Induction Type: IV induction Ventilation: Mask ventilation without difficulty Laryngoscope Size: Glidescope and 3 Grade View: Grade I Tube type: Oral Tube size: 7.0 mm Number of attempts: 1 Airway Equipment and Method: Stylet Placement Confirmation: ETT inserted through vocal cords under direct vision,  positive ETCO2 and breath sounds checked- equal and bilateral Secured at: 21 cm Tube secured with: Tape Dental Injury: Teeth and Oropharynx as per pre-operative assessment  Difficulty Due To: Difficulty was anticipated Comments: Elective Glidescope intubation due to past history of difficult intubation. DL x 1 with Glidescope 3, Grade 1 view and 7.0 ETT placed with ease.

## 2018-05-09 NOTE — Discharge Instructions (Addendum)
[facility]      [facility] °[phone] ° °ABDOMINAL SURGERY: POST OP INSTRUCTIONS ° °Always review your discharge instruction sheet given to you by the facility where your surgery was performed. ° °IF YOU HAVE DISABILITY OR FAMILY LEAVE FORMS, YOU MUST BRING THEM TO THE OFFICE FOR PROCESSING.  PLEASE DO NOT GIVE THEM TO YOUR DOCTOR. ° °1. A prescription for pain medication may be given to you upon discharge.  Take your pain medication as prescribed, if needed.  If narcotic pain medicine is not needed, then you may take acetaminophen (Tylenol) or ibuprofen (Advil) as needed. °2. Take your usually prescribed medications unless otherwise directed. °3. If you need a refill on your pain medication, please contact your pharmacy. They will contact our office to request authorization.  Prescriptions will not be filled after 5pm or on week-ends. °4. You should follow a light diet the first few days after arrival home, such as soup and crackers, pudding, etc.unless your doctor has advised otherwise. A high-fiber, low fat diet can be resumed as tolerated.   Be sure to include lots of fluids daily. Most patients will experience some swelling and bruising on the chest and neck area.  Ice packs will help.  Swelling and bruising can take several days to resolve °5. Most patients will experience some swelling and bruising in the area of the incision. Ice pack will help. Swelling and bruising can take several days to resolve..  °6. It is common to experience some constipation if taking pain medication after surgery.  Increasing fluid intake and taking a stool softener will usually help or prevent this problem from occurring.  A mild laxative (Milk of Magnesia or Miralax) should be taken according to package directions if there are no bowel movements after 48 hours. °7.  You may have steri-strips (small skin tapes) in place directly over the incision.  These strips should be left on the skin for 10-14 days.  If your  surgeon used skin glue on the incision, you may shower in 48 hours.  The glue will flake off over the next 2-3 weeks.  Any sutures or staples will be removed at the office during your follow-up visit. You may find that a light gauze bandage over your incision may keep your staples from being rubbed or pulled. You may shower and replace the bandage daily. °8. ACTIVITIES:  You may resume regular (light) daily activities beginning the next day--such as daily self-care, walking, climbing stairs--gradually increasing activities as tolerated.  You may have sexual intercourse when it is comfortable.  Refrain from any heavy lifting or straining until approved by your doctor. °a. You may drive when you no longer are taking prescription pain medication, you can comfortably wear a seatbelt, and you can safely maneuver your car and apply brakes °b. Return to Work: __________8 weeks if applicable_________________________ °9. You should see your doctor in the office for a follow-up appointment approximately two weeks after your surgery.  Make sure that you call for this appointment within a day or two after you arrive home to insure a convenient appointment time. °OTHER INSTRUCTIONS:  °_____________________________________________________________ °_____________________________________________________________ ° °WHEN TO CALL YOUR DOCTOR: °1. Fever over 101.0 °2. Inability to urinate °3. Nausea and/or vomiting °4. Extreme swelling or bruising °5. Continued bleeding from incision. °6. Increased pain, redness, or drainage from the incision. °7. Difficulty swallowing or breathing °8. Muscle cramping or spasms. °9. Numbness or tingling in hands or feet or around lips. ° °The clinic staff is   available to answer your questions during regular business hours.  Please dont hesitate to call and ask to speak to one of the nurses if you have concerns.  For further questions, please visit [email or web address]    How to Give a  Feeding Through a Feeding Tube A feeding tube is a soft, flexible tube through which medicine, water, and liquid food (formulaor breastmilk) can be given. A person may have a feeding tube if he or she has trouble swallowing or cannot have food or medicine by mouth. A health care provider may also give you more specific instructions. If you have problems or questions, contact a health care provider. Supplies needed:  Prescribed formula.   Feeding tube pump or syringe pump as needed.  Pole to hang feeding.  20-60 mL syringe to check tube placement.  A syringe to flush the feeding tube.  Sterile or purified water. Follow these guidelines: ? Use sterile water if the person has a weak immune system and has difficulty fighting off infections (is immunocompromised), or if you are unsure about the amount of chemical contaminants in purified or drinking water. ? Purify drinking water by boiling it for at least 1 minute. Keep a lid over the water while it boils. Allow the water to cool to room temperature before using it. How to give a feeding through a feeding tube pump  1. Have all supplies ready and available. 2. Wash your hands with soap and water. 3. Check the placement of the feeding tube as directed. 4. Raise the head of the person 30-45, or as directed. 5. Pour the prescribed amount of formula into the feeding bag set. 6. Hang the feeding bag set from a pole. Formula that is prepared in a sterile way can hang for up to 8 hours. For a newborn, hang time should be limited to 4 hours. 7. Prime the entire feeding bag set with the formula. To do this, allow the liquid to travel through the entire set to the end of the tubing. 8. Cap the feeding bag set until after the feeding tube has been flushed. 9. Load the feeding bag set into the feeding tube pump. 10. Clamp or kink the feeding tube before removing the cap and as you are disconnecting syringes and feeding tubing. 11. Uncap the end of the  feeding tube. 12. Use a syringe to flush the feeding tube with purified or sterile water as directed. 13. Uncap the feeding bag set. 14. Connect the feeding bag set to the feeding tube. 15. Set the prescribed feeding rate on the feeding tube pump. 16. Start the feeding tube pump.  Contact a health care provider if:  You are having trouble doing a tube feeding.  You are not sure what type of formula to give.  The feeding tube is clogged, falls out, or does not work.  The person who is getting the feeding has unusual weight loss or weight gain. Get help right away if:  The skin area around the feeding tube is red, swollen, warm to the touch, or tender.  There is drainage coming from the area around the feeding tube.  The drainage coming from the area around the feeding tube has a bad smell.  The person who is getting the feeding develops any of these problems: ? Vomiting or nausea. ? Fever. ? Constipation or diarrhea. ? A large, bloated stomach. Summary  A person may have a feeding tube if he or she has trouble swallowing or cannot have food  or medicine by mouth.  You can give formula or breast milk through the tube.  Have all of your supplies ready and available before giving a feeding.  If you have problems or questions, contact a health care provider. This information is not intended to replace advice given to you by your health care provider. Make sure you discuss any questions you have with your health care provider. Document Released: 01/24/2012 Document Revised: 03/21/2016 Document Reviewed: 03/21/2016 Elsevier Interactive Patient Education  2019 [employer].       Per Dr. Ardis Hughs,  Please continue taking:          Creon 36000 UNITS Cpep capsule  Take 35,[phone] Units by mouth See admin instructions.  Take 36000 units with small meals and 72000 units with big meals  Generic drug: lipase/protease/amylase

## 2018-05-09 NOTE — Op Note (Signed)
PRE-OPERATIVE DIAGNOSIS: Pancreatic cancer and severe protein calorie malnutrition  POST-OPERATIVE DIAGNOSIS:  Same and left-sided liver metastases  PROCEDURE:  Procedure(s): Diagnostic laparoscopy, feeding jejunostomy, repair of umbilical hernia.  SURGEON:  Surgeon(s): Stark Klein, MD  ANESTHESIA:   local and general  DRAINS: Jejunostomy Tube   LOCAL MEDICATIONS USED:  BUPIVICAINE  and LIDOCAINE   SPECIMEN:  Source of Specimen:  Segment 4 liver lesion and left lateral segment liver lesions  DISPOSITION OF SPECIMEN:  PATHOLOGY  COUNTS:  YES  DICTATION: .Dragon Dictation  PLAN OF CARE: Admit to inpatient   PATIENT DISPOSITION:  PACU - hemodynamically stable.  FINDINGS: Several small nodules on the left lateral segment and one nodule seen on the right of the falciform.  EBL: Minimal  PROCEDURE:  Patient was identified in the holding area and taken to the operating room where she was placed supine on operating room table.  General anesthesia was induced.  The patient's left arm was tucked and the abdomen was prepped and draped in sterile fashion.  Timeout was performed according to the surgical safety checklist.  When all was correct, we continued.  The patient was placed into reverse Trendelenburg position and rotated to the right.  A 5 mm Optiview trocar was used to gain access to the abdomen.  The patient has a large pseudocyst and this is pressing the stomach anteriorly.  An orogastric tube was placed to help drain this.  There were a few suspicious areas seen on the left.  A second 5 mm trocar was placed on the left lateral abdomen.  This was placed in an appropriate site for a feeding tube.  There was omentum incarcerated into the small supraumbilical hernia.  This was taken down bluntly.  Once the omentum was taken down, a third trocar was placed into the hernia sac.  This was in the midline.  The laparoscopic biopsy forceps were used to biopsy the nodule just to the right  of the falciform and segment 4.  The left-sided nodules were biopsied and sent together.  The left-sided nodules were quite firm.  The laparoscopic cautery was used to coagulate the surface of the liver.  The areas were then hemostatic.  The nodules were sent for frozen section.  The small hernia site was then opened.  This was approximately 3 cm in length.  This provided adequate visualization for placement of the J-tube.  The jejunum was pulled out of the incision and this was traced to make sure that the proximal and distal directions were correct.  A pursestring suture was placed at the appropriate site in the jejunum.  The left lateral port site was then used as a location for the J-tube.  A tonsil clamp was used to pull the tube into the abdomen.  This was truncated with the scissors and then several additional holes were cut in the distal portion of the tube.  The jejunum was opened and the pursestring suture and the tube was threaded distally.  3 cc of saline were placed into the feeding tube balloon.  The pursestring suture was tied down.  The tube was then witzeled with additional 3-0 silk sutures.  The jejunum was then tacked to the abdominal wall at the tube insertion site and partially longitudinally beyond that.  The tube was then flushed with saline which happened easily.  The flange of the tube was secured with 2-0 nylons.  The fascia was then closed with 0 PDS in running fashion.  The skin was irrigated.  The hernia incision was then closed using interrupted 3-0 Vicryl deep dermal sutures and running 4-0 Monocryl subcuticular suture.  The port site at the left costal margin was also closed with 4-0 Monocryl in subcuticular fashion.  The wounds were then cleaned, dried, and dressed with Dermabond.  Appropriate dressing was placed on the J-tube site.    The patient was then allowed to emerge from anesthesia.  She was taken to the PACU in stable condition.  Needle, sponge, and instrument  counts were correct x2.

## 2018-05-10 ENCOUNTER — Other Ambulatory Visit: Payer: Self-pay

## 2018-05-10 ENCOUNTER — Encounter (HOSPITAL_COMMUNITY): Payer: Self-pay | Admitting: General Surgery

## 2018-05-10 ENCOUNTER — Inpatient Hospital Stay (HOSPITAL_COMMUNITY): Payer: Medicare Other

## 2018-05-10 ENCOUNTER — Other Ambulatory Visit: Payer: Self-pay | Admitting: *Deleted

## 2018-05-10 ENCOUNTER — Ambulatory Visit: Payer: Medicare Other

## 2018-05-10 DIAGNOSIS — C250 Malignant neoplasm of head of pancreas: Secondary | ICD-10-CM

## 2018-05-10 LAB — CBC
HCT: 25.3 % — ABNORMAL LOW (ref 36.0–46.0)
Hemoglobin: 7.8 g/dL — ABNORMAL LOW (ref 12.0–15.0)
MCH: 31.6 pg (ref 26.0–34.0)
MCHC: 30.8 g/dL (ref 30.0–36.0)
MCV: 102.4 fL — ABNORMAL HIGH (ref 80.0–100.0)
Platelets: 260 10*3/uL (ref 150–400)
RBC: 2.47 MIL/uL — ABNORMAL LOW (ref 3.87–5.11)
RDW: 16 % — ABNORMAL HIGH (ref 11.5–15.5)
WBC: 11.4 10*3/uL — ABNORMAL HIGH (ref 4.0–10.5)
nRBC: 0 % (ref 0.0–0.2)

## 2018-05-10 LAB — GLUCOSE, CAPILLARY
GLUCOSE-CAPILLARY: 122 mg/dL — AB (ref 70–99)
Glucose-Capillary: 132 mg/dL — ABNORMAL HIGH (ref 70–99)
Glucose-Capillary: 129 mg/dL — ABNORMAL HIGH (ref 70–99)
Glucose-Capillary: 134 mg/dL — ABNORMAL HIGH (ref 70–99)
Glucose-Capillary: 135 mg/dL — ABNORMAL HIGH (ref 70–99)

## 2018-05-10 LAB — BASIC METABOLIC PANEL
Anion gap: 8 (ref 5–15)
BUN: <5 mg/dL — ABNORMAL LOW (ref 8–23)
CO2: 23 mmol/L (ref 22–32)
CREATININE: 0.96 mg/dL (ref 0.44–1.00)
Calcium: 8 mg/dL — ABNORMAL LOW (ref 8.9–10.3)
Chloride: 104 mmol/L (ref 98–111)
GFR calc Af Amer: >60 mL/min (ref >60)
GFR calc non Af Amer: >60 mL/min (ref >60)
Glucose, Bld: 128 mg/dL — ABNORMAL HIGH (ref 70–99)
Potassium: 3.7 mmol/L (ref 3.5–5.1)
SODIUM: 135 mmol/L (ref 135–145)

## 2018-05-10 LAB — PHOSPHORUS
Phosphorus: 2.7 mg/dL (ref 2.5–4.6)
Phosphorus: 2.5 mg/dL (ref 2.5–4.6)

## 2018-05-10 LAB — MAGNESIUM
MAGNESIUM: 1.3 mg/dL — AB (ref 1.7–2.4)
Magnesium: 1.3 mg/dL — ABNORMAL LOW (ref 1.7–2.4)

## 2018-05-10 MED ORDER — TIZANIDINE HCL 2 MG PO TABS: 2.0000 mg | ORAL_TABLET | Freq: Three times a day (TID) | ORAL | 0 refills | Status: AC | PRN

## 2018-05-10 MED ORDER — OSMOLITE 1.2 CAL PO LIQD: ORAL | 3 refills | Status: DC

## 2018-05-10 MED ORDER — PROMETHAZINE HCL 25 MG/ML IJ SOLN
12.5000 mg | Freq: Four times a day (QID) | INTRAMUSCULAR | Status: DC | PRN
  Administered 2018-05-10 – 2018-05-19 (×18): 12.5 mg via INTRAVENOUS
  Filled 2018-05-10 (×19): qty 1

## 2018-05-10 MED ORDER — IOHEXOL 300 MG/ML  SOLN
100.0000 mL | Freq: Once | INTRAMUSCULAR | Status: AC | PRN
  Administered 2018-05-10: 100 mL via INTRAVENOUS

## 2018-05-10 MED ORDER — SODIUM CHLORIDE 0.9% FLUSH
10.0000 mL | INTRAVENOUS | Status: DC | PRN
  Administered 2018-05-20 – 2018-05-21 (×3): 10 mL
  Filled 2018-05-10 (×3): qty 40

## 2018-05-10 MED ORDER — SODIUM CHLORIDE (PF) 0.9 % IJ SOLN
INTRAMUSCULAR | Status: AC
  Filled 2018-05-10: qty 50

## 2018-05-10 NOTE — Progress Notes (Signed)
1 Day Post-Op   Subjective/Chief Complaint: S/p dx laparoscopy with liver mets found.  J tube placed.  Still having quite a bit of pain, but seems to be controlled with regimen of fentanyl patch, prn oxy and prn dilaudid while post op.  No n/v.  No flatus yet   Objective: Vital signs in last 24 hours: Temp:  [97.7 F (36.5 C)-98.6 F (37 C)] 98.3 F (36.8 C) (03/20 0402) Pulse Rate:  [81-98] 86 (03/20 0402) Resp:  [14-24] 16 (03/19 2336) BP: (135-170)/(63-85) 135/63 (03/20 0402) SpO2:  [98 %-100 %] 98 % (03/20 0402) Weight:  [56.6 kg] 56.6 kg (03/20 0453) Last BM Date: 05/08/18  Intake/Output from previous day: 03/19 0701 - 03/20 0700 In: 3691.4 [P.O.:400; I.V.:2552.2; NG/GT:89.3; IV Piggyback:650] Out: 695 [Urine:675; Blood:20] Intake/Output this shift: No intake/output data recorded.  General appearance: alert and depressed appearing Resp: breathing comfortably GI: soft, sl distended, pseudocyst palpable in upper abdomen Extremities: extremities normal, atraumatic, no cyanosis or edema  Lab Results:  Recent Labs    05/07/18 0920 05/10/18 0500  WBC 8.2 11.4*  HGB 9.5* 7.8*  HCT 30.8* 25.3*  PLT 316 260   BMET Recent Labs    05/07/18 0920 05/10/18 0500  NA 138 135  K 4.0 3.7  CL 105 104  CO2 21* 23  GLUCOSE 91 128*  BUN 7* <5*  CREATININE 1.04* 0.96  CALCIUM 8.8* 8.0*   PT/INR Recent Labs    05/07/18 0920  LABPROT 15.4*  INR 1.2   ABG No results for input(s): PHART, HCO3 in the last 72 hours.  Invalid input(s): PCO2, PO2  Studies/Results: Ir Radiologist Eval & Mgmt  Result Date: 05/08/2018 Please refer to notes tab for details about interventional procedure. (Op Note)   Anti-infectives: Anti-infectives (From admission, onward)   Start     Dose/Rate Route Frequency Ordered Stop   05/09/18 2000  ciprofloxacin (CIPRO) IVPB 400 mg     400 mg 200 mL/hr over 60 Minutes Intravenous Every 12 hours 05/09/18 1136 05/09/18 2101   05/09/18 0600   ciprofloxacin (CIPRO) IVPB 400 mg     400 mg 200 mL/hr over 60 Minutes Intravenous On call to O.R. 05/09/18 0546 05/09/18 0810      Assessment/Plan: s/p Procedure(s): LAPAROSCOPY DIAGNOSTIC AND J TUBE PLACEMENT (FEEDING TUBE) (N/A) advancing osmolite to goal.  OOB, PT consult Chronic anemia from chemo and cancer, mild acute blood loss anemia.  Recheck in AM. HTN - home meds Severe protein calorie malnutrition - tube feeds Pancreatic cancer stage IV - has follow up with Dr. Grayland Ormond next week to discuss next steps PT consult for severe protein calorie malnutrition.   Case management consult to get [facility] set up.  Will need HH j tube feeds and probably [place] PT.    LOS: 1 day    Stark Klein 05/10/2018

## 2018-05-10 NOTE — Telephone Encounter (Signed)
error 

## 2018-05-10 NOTE — TOC Initial Note (Addendum)
Transition of Care [facility]) - Initial/Assessment Note    Patient Details  Name: Cindy Bowman MRN: [id number] Date of Birth: [birth year]  [facility] CM/SW Contact:    Lynnell Catalan, RN Phone Number: 05/10/2018, 2:43 PM  Clinical Narrative:                 Spoke with pt and son at bedside. Request [facility] for [facility] services as have used them before.  Expected Discharge Plan: [place] Barriers to Discharge: No Barriers Identified   Patient Goals and CMS Choice Patient states their goals for this hospitalization and ongoing recovery are:: to get better CMS Medicare.gov Compare Post Acute Care list provided to:: Patient, son   Expected Discharge Plan and Services Expected Discharge Plan: [place]   Discharge Planning Services: CM Consult Post Acute Care Choice: [place] arrangements for the past 2 months: [place] agency is Company secretary for [facility]   Prior Living Arrangements/Services Living arrangements for the past 2 months: Single Family Home Lives with:  Son Patient language and need for interpreter reviewed:: Yes Do you feel safe going back to the place where you live?: Yes      Need for Family Participation in Patient Care: Yes (Comment) Care giver support system in place?: Yes (comment)   Criminal Activity/Legal Involvement Pertinent to Current Situation/Hospitalization: No - Comment as needed  Activities of Daily Living Home Assistive Devices/Equipment: Gilford Rile (specify type) ADL Screening (condition at time of admission) Patient's cognitive ability adequate to safely complete daily activities?: Yes Is the patient deaf or have difficulty hearing?: No Does the patient have difficulty seeing, even when wearing glasses/contacts?: No Does the patient have difficulty concentrating, remembering, or making decisions?: No Patient able to express need for assistance with ADLs?: Yes Does the  patient have difficulty dressing or bathing?: No Independently performs ADLs?: Yes (appropriate for developmental age) Does the patient have difficulty walking or climbing stairs?: Yes Weakness of Legs: Both Weakness of Arms/Hands: Both  Permission Sought/Granted Permission sought to share information with : Facility Marine scientist permission given Permission grant to give information to [facility]        Emotional Assessment Appearance:: Appears stated age Attitude/Demeanor/Rapport: Apprehensive Affect (typically observed): Anxious Orientation: : Oriented to Self, Oriented to Place, Oriented to  Time, Oriented to Situation Alcohol / Substance Use: Not Applicable Psych Involvement: No (comment)  Admission diagnosis:  PANCREATIC CANCER SEVERE PROTEIN CALORIE MALNUTRITION Patient Active Problem List   Diagnosis Date Noted  . Pancreatic cancer metastasized to liver ([place]) 05/09/2018  . Malnutrition of moderate degree 04/08/2018  . Pain, cancer 04/05/2018  . Pancreatic pseudocyst   . Palliative care encounter   . Ventral hernia without obstruction or gangrene 03/21/2018  . Acute deep vein thrombosis (DVT) of femoral vein of left lower extremity ([place]) 03/11/2018  . Acute kidney injury ([place]) 03/11/2018  . Protein-calorie malnutrition, severe 03/10/2018  . Acute pancreatitis 03/08/2018  . Chemotherapy induced neutropenia ([place]) 12/12/2017  . 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 Nausea & vomiting 11/16/2017  . Malignant neoplasm of head of pancreas ([place]) 10/18/2017  . Spinal stenosis 09/19/2017  . CKD (chronic kidney disease) stage 3, GFR 30-59 ml/min (HCC) 07/24/2017  . Gallstones 06/19/2017  . 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 Essential hypertension 12/14/2015  . Renal cyst, left 12/14/2015   PCP:  Candiss Norse,  Delana Meyer, MD Pharmacy:   [facility] [place], [place] - [place] [address] [place] [address] Phone: [phone]  Fax: [phone]  [id number] [place], [place] [facility] [place] [address] Phone: [phone] Fax: [phone]     Social Determinants of Health (SDOH) Interventions    Readmission Risk Interventions No flowsheet data found.

## 2018-05-10 NOTE — Progress Notes (Signed)
PT Cancellation Note  Patient Details Name: Cindy Bowman MRN: [id number] DOB: Nov 07, 1950   Cancelled Treatment:     PT order received but eval deferred - pt for CT scan and then states "too nauseous" to participate.  Will follow.  Debe Coder PT Acute Rehabilitation Services Pager [phone] Office [phone]    [facility] 05/10/2018, 3:09 PM

## 2018-05-11 ENCOUNTER — Inpatient Hospital Stay (HOSPITAL_COMMUNITY): Payer: Medicare Other

## 2018-05-11 LAB — CBC
HCT: 27.8 % — ABNORMAL LOW (ref 36.0–46.0)
Hemoglobin: 8.6 g/dL — ABNORMAL LOW (ref 12.0–15.0)
MCH: 31.6 pg (ref 26.0–34.0)
MCHC: 30.9 g/dL (ref 30.0–36.0)
MCV: 102.2 fL — ABNORMAL HIGH (ref 80.0–100.0)
Platelets: 276 10*3/uL (ref 150–400)
RBC: 2.72 MIL/uL — ABNORMAL LOW (ref 3.87–5.11)
RDW: 16.4 % — ABNORMAL HIGH (ref 11.5–15.5)
WBC: 12.1 10*3/uL — ABNORMAL HIGH (ref 4.0–10.5)
nRBC: 0 % (ref 0.0–0.2)

## 2018-05-11 LAB — GLUCOSE, CAPILLARY
Glucose-Capillary: 102 mg/dL — ABNORMAL HIGH (ref 70–99)
Glucose-Capillary: 104 mg/dL — ABNORMAL HIGH (ref 70–99)
Glucose-Capillary: 104 mg/dL — ABNORMAL HIGH (ref 70–99)
Glucose-Capillary: 110 mg/dL — ABNORMAL HIGH (ref 70–99)
Glucose-Capillary: 112 mg/dL — ABNORMAL HIGH (ref 70–99)
Glucose-Capillary: 115 mg/dL — ABNORMAL HIGH (ref 70–99)
Glucose-Capillary: 134 mg/dL — ABNORMAL HIGH (ref 70–99)

## 2018-05-11 LAB — BASIC METABOLIC PANEL
Anion gap: 6 (ref 5–15)
BUN: <5 mg/dL — ABNORMAL LOW (ref 8–23)
CO2: 25 mmol/L (ref 22–32)
Calcium: 8.3 mg/dL — ABNORMAL LOW (ref 8.9–10.3)
Chloride: 106 mmol/L (ref 98–111)
Creatinine, Ser: 0.9 mg/dL (ref 0.44–1.00)
GFR calc Af Amer: >60 mL/min (ref >60)
GFR calc non Af Amer: >60 mL/min (ref >60)
Glucose, Bld: 128 mg/dL — ABNORMAL HIGH (ref 70–99)
Potassium: 3.8 mmol/L (ref 3.5–5.1)
Sodium: 137 mmol/L (ref 135–145)

## 2018-05-11 LAB — PHOSPHORUS: Phosphorus: 2.6 mg/dL (ref 2.5–4.6)

## 2018-05-11 LAB — MAGNESIUM: Magnesium: 1.3 mg/dL — ABNORMAL LOW (ref 1.7–2.4)

## 2018-05-11 MED ORDER — HYDROMORPHONE HCL 1 MG/ML IJ SOLN
0.5000 mg | INTRAMUSCULAR | Status: DC | PRN
  Administered 2018-05-11 – 2018-05-16 (×9): 0.5 mg via INTRAVENOUS
  Filled 2018-05-11 (×11): qty 0.5

## 2018-05-11 NOTE — Progress Notes (Signed)
Paged on-call for general surgery- pt still having constant nausea and unable to keep even sips of water down, son also inquiring about CT scan. Dr. Kieth Brightly acknowledged page, said scan showed nothing concerning for obstruction and the patient was told this, advised to keep npo at this time if she is unable to tolerate sips, no further anti-emetic orders, keep medicating per [facility] with zofran and phenergan, relayed information to son and will monitor.

## 2018-05-11 NOTE — Progress Notes (Signed)
PT Cancellation Note  Patient Details Name: Cindy Bowman MRN: [id number] DOB: December 16, 1950   Cancelled Treatment:    Reason Eval/Treat Not Completed: Medical issues which prohibited therapy, patient had gone for test, feeling nauseated when checked on patient again. Will check back another time.    Claretha Cooper 05/11/2018, 1:50 PM [place] Pager [phone] Office [phone]

## 2018-05-11 NOTE — Progress Notes (Signed)
2 Days Post-Op  Subjective: Quiet, soft-spoken, a little bit withdrawn. Son in room Having some diarrhea.  Having pain and one episode of vomiting. Tube feedings at 15 cc/h Afebrile.  SPO2 97%.  Heart rate 100.  Hemoglobin 8.6.  WBC 12,100.  Potassium 3.8.  Creatinine 0.9   Objective: Vital signs in last 24 hours: Temp:  [97.9 F (36.6 C)-99 F (37.2 C)] 99 F (37.2 C) (03/21 0456) Pulse Rate:  [86-103] 100 (03/21 0456) Resp:  [16-22] 16 (03/21 0456) BP: (146-154)/(67-82) 148/67 (03/21 0456) SpO2:  [97 %-100 %] 97 % (03/21 0456) Weight:  [57.7 kg] 57.7 kg (03/21 0456) Last BM Date: 05/08/18  Intake/Output from previous day: 03/20 0701 - 03/21 0700 In: 5 [P.O.:5] Out: 200 [Urine:200] Intake/Output this shift: Total I/O In: -  Out: 400 [Urine:400]  General appearance: Awake.  Soft-spoken and somewhat withdrawn.  Answers questions appropriately.  Skin warm and dry  Lungs: Clear to auscultation bilaterally Abdomen: Soft but still slightly distended.  Midline incision looks good.  J-tube infusing and site looks good.  Lab Results:  Results for orders placed or performed during the hospital encounter of 05/09/18 (from the past 24 hour(s))  Glucose, capillary     Status: Abnormal   Collection Time: 05/10/18 12:16 PM  Result Value Ref Range   Glucose-Capillary 129 (H) 70 - 99 mg/dL  Glucose, capillary     Status: Abnormal   Collection Time: 05/10/18  3:43 PM  Result Value Ref Range   Glucose-Capillary 134 (H) 70 - 99 mg/dL  Magnesium     Status: Abnormal   Collection Time: 05/10/18  6:12 PM  Result Value Ref Range   Magnesium 1.3 (L) 1.7 - 2.4 mg/dL  Phosphorus     Status: None   Collection Time: 05/10/18  6:12 PM  Result Value Ref Range   Phosphorus 2.5 2.5 - 4.6 mg/dL  Glucose, capillary     Status: Abnormal   Collection Time: 05/10/18  8:30 PM  Result Value Ref Range   Glucose-Capillary 135 (H) 70 - 99 mg/dL   Comment 1 Notify RN   Glucose, capillary     Status:  Abnormal   Collection Time: 05/11/18 12:24 AM  Result Value Ref Range   Glucose-Capillary 104 (H) 70 - 99 mg/dL   Comment 1 Notify RN   CBC     Status: Abnormal   Collection Time: 05/11/18  3:32 AM  Result Value Ref Range   WBC 12.1 (H) 4.0 - 10.5 K/uL   RBC 2.72 (L) 3.87 - 5.11 MIL/uL   Hemoglobin 8.6 (L) 12.0 - 15.0 g/dL   HCT 27.8 (L) 36.0 - 46.0 %   MCV 102.2 (H) 80.0 - 100.0 fL   MCH 31.6 26.0 - 34.0 pg   MCHC 30.9 30.0 - 36.0 g/dL   RDW 16.4 (H) 11.5 - 15.5 %   Platelets 276 150 - 400 K/uL   nRBC 0.0 0.0 - 0.2 %  Basic metabolic panel     Status: Abnormal   Collection Time: 05/11/18  3:32 AM  Result Value Ref Range   Sodium 137 135 - 145 mmol/L   Potassium 3.8 3.5 - 5.1 mmol/L   Chloride 106 98 - 111 mmol/L   CO2 25 22 - 32 mmol/L   Glucose, Bld 128 (H) 70 - 99 mg/dL   BUN <5 (L) 8 - 23 mg/dL   Creatinine, Ser 0.90 0.44 - 1.00 mg/dL   Calcium 8.3 (L) 8.9 - 10.3 mg/dL  GFR calc non Af Amer >60 >60 mL/min   GFR calc Af Amer >60 >60 mL/min   Anion gap 6 5 - 15  Magnesium     Status: Abnormal   Collection Time: 05/11/18  3:32 AM  Result Value Ref Range   Magnesium 1.3 (L) 1.7 - 2.4 mg/dL  Phosphorus     Status: None   Collection Time: 05/11/18  3:32 AM  Result Value Ref Range   Phosphorus 2.6 2.5 - 4.6 mg/dL  Glucose, capillary     Status: Abnormal   Collection Time: 05/11/18  4:58 AM  Result Value Ref Range   Glucose-Capillary 112 (H) 70 - 99 mg/dL   Comment 1 Notify RN   Glucose, capillary     Status: Abnormal   Collection Time: 05/11/18  7:22 AM  Result Value Ref Range   Glucose-Capillary 110 (H) 70 - 99 mg/dL     Studies/Results: Ct Abdomen Pelvis W Contrast  Result Date: 05/10/2018 CLINICAL DATA:  Stage IV pancreatic cancer. Status post laparoscopy and J-tube placement. EXAM: CT ABDOMEN AND PELVIS WITH CONTRAST TECHNIQUE: Multidetector CT imaging of the abdomen and pelvis was performed using the standard protocol following bolus administration of  intravenous contrast. CONTRAST:  121mL OMNIPAQUE IOHEXOL 300 MG/ML  SOLN COMPARISON:  04/04/2018 FINDINGS: Lower chest: Patchy bilateral lower lobe opacities, likely atelectasis. Small bilateral pleural effusions. Hepatobiliary: Hepatic steatosis with geographic areas of focal fatty sparing. Possible 5 x 10 mm serosal implant along the anterior surface of the liver inferiorly along segment 3 (series 2/image 46). Status post cholecystectomy. No intrahepatic or extrahepatic ductal dilatation. Pancreas: Parenchymal atrophy. 2.4 x 3.0 cm hypoenhancing mass in the uncinate process (series 2/image 86), corresponding to the patient's known pancreatic adenocarcinoma, previously 2.4 x 2.8 cm. Adjacent fiducial markers. Large pseudocyst along the pancreatic body/tail measuring 7.2 x 12.7 cm (series 2/image 49), previously 5.0 x 9.8 cm. Additional 4.9 x 6.0 cm complex pseudocyst along the pancreatic tail/splenic hilum (series 2/image 25), previously 3.5 x 2.9 cm. Spleen: Within normal limits. Adrenals/Urinary Tract: Adrenal glands are within normal limits. 2.9 x 2.6 cm enhancing mass in the lateral left upper kidney (series 2/image 83), compatible with solid renal neoplasm, grossly unchanged. Right kidney is within normal limits. No hydronephrosis. Bladder is underdistended but unremarkable. Stomach/Bowel: Stomach is grossly unremarkable. Visualized bowel is notable for a percutaneous jejunostomy in satisfactory position. No evidence of bowel obstruction. Normal appendix (series 4/image 101). Left colonic diverticulosis, without evidence of diverticulitis. Vascular/Lymphatic: No evidence of abdominal aortic aneurysm. No suspicious abdominopelvic lymphadenopathy. Reproductive: Uterus is within normal limits. No adnexal masses. Other: Small volume perihepatic and pelvic ascites. Mild nondependent gas/free air related to recent laparoscopy. Tiny fat/fluid containing bilateral inguinal hernias, right greater than left.  Subcutaneous emphysema/fluid along the left lateral abdominal wall, related to recent laparoscopy. Musculoskeletal: Degenerative changes of the visualized thoracolumbar spine. IMPRESSION: 3.0 cm hypoenhancing mass in the uncinate process, corresponding to the patient's known pancreatic adenocarcinoma, mildly increased. Adjacent fiducial markers. Progressive pseudocysts in the left upper abdomen. Possible new 10 mm serosal implant along the anterior surface of segment 3. 2.9 cm solid renal neoplasm in the left upper kidney, grossly unchanged. Postsurgical changes related to recent laparoscopy, as above. Placement of an indwelling percutaneous jejunostomy in satisfactory position. Electronically Signed   By: Julian Hy M.D.   On: 05/10/2018 14:14    . enoxaparin (LOVENOX) injection  40 mg Subcutaneous Q24H  . feeding supplement (OSMOLITE 1.2 CAL)  1,000 mL Per Tube Q24H  .  fentaNYL  1 patch Transdermal Q72H  . folic acid  1 mg Oral Daily  . gabapentin  300 mg Oral BID  . insulin aspart  0-9 Units Subcutaneous Q4H  . lipase/protease/amylase  36,000-72,000 Units Oral TID WC  . losartan  50 mg Oral Daily  . potassium chloride  20 mEq Oral Daily  . scopolamine  1 patch Transdermal On Call to OR     Assessment/Plan: s/p Procedure(s): LAPAROSCOPY DIAGNOSTIC AND J TUBE PLACEMENT (FEEDING TUBE)  Pancreatic cancer stage IV, liver mets.-follow-up with Dr. Grayland Ormond next week to discuss next steps  Due to vomiting will need to go slow with tube feeding advancement Check abdominal x-ray Labs in a.m.  PT consult for severe protein calorie malnutrition Case management consult to get [facility] set up.  Will need HH J-tube feeds and probably HH PT Hypertension-Home meds  @PROBHOSP @  LOS: 2 days    Adin Hector 05/11/2018  . .prob

## 2018-05-11 NOTE — Progress Notes (Signed)
PT Cancellation Note  Patient Details Name: Avalyn Molino MRN: [id number] DOB: May 10, 1950   Cancelled Treatment:    Reason Eval/Treat Not Completed: Patient at procedure or test/unavailable   Claretha Cooper 05/11/2018, 11:16 AM  [place] Pager [phone] Office [phone]'

## 2018-05-11 NOTE — Plan of Care (Signed)

## 2018-05-12 LAB — COMPREHENSIVE METABOLIC PANEL
ALT: 14 U/L (ref 0–44)
AST: 18 U/L (ref 15–41)
Albumin: 2.4 g/dL — ABNORMAL LOW (ref 3.5–5.0)
Alkaline Phosphatase: 82 U/L (ref 38–126)
Anion gap: 8 (ref 5–15)
BUN: 9 mg/dL (ref 8–23)
CHLORIDE: 105 mmol/L (ref 98–111)
CO2: 25 mmol/L (ref 22–32)
CREATININE: 0.94 mg/dL (ref 0.44–1.00)
Calcium: 8.3 mg/dL — ABNORMAL LOW (ref 8.9–10.3)
GFR calc Af Amer: >60 mL/min (ref >60)
GFR calc non Af Amer: >60 mL/min (ref >60)
Glucose, Bld: 116 mg/dL — ABNORMAL HIGH (ref 70–99)
Potassium: 3.7 mmol/L (ref 3.5–5.1)
Sodium: 138 mmol/L (ref 135–145)
Total Bilirubin: 0.5 mg/dL (ref 0.3–1.2)
Total Protein: 6.8 g/dL (ref 6.5–8.1)

## 2018-05-12 LAB — CBC
HCT: 27.8 % — ABNORMAL LOW (ref 36.0–46.0)
HEMOGLOBIN: 8.4 g/dL — AB (ref 12.0–15.0)
MCH: 31 pg (ref 26.0–34.0)
MCHC: 30.2 g/dL (ref 30.0–36.0)
MCV: 102.6 fL — ABNORMAL HIGH (ref 80.0–100.0)
Platelets: 260 10*3/uL (ref 150–400)
RBC: 2.71 MIL/uL — ABNORMAL LOW (ref 3.87–5.11)
RDW: 16.4 % — ABNORMAL HIGH (ref 11.5–15.5)
WBC: 10.5 10*3/uL (ref 4.0–10.5)
nRBC: 0 % (ref 0.0–0.2)

## 2018-05-12 LAB — GLUCOSE, CAPILLARY
Glucose-Capillary: 115 mg/dL — ABNORMAL HIGH (ref 70–99)
Glucose-Capillary: 130 mg/dL — ABNORMAL HIGH (ref 70–99)
Glucose-Capillary: 140 mg/dL — ABNORMAL HIGH (ref 70–99)

## 2018-05-12 LAB — LIPASE, BLOOD: Lipase: 43 U/L (ref 11–51)

## 2018-05-12 MED ORDER — POTASSIUM PHOSPHATES 15 MMOLE/5ML IV SOLN
20.0000 mmol | Freq: Once | INTRAVENOUS | Status: AC
  Administered 2018-05-12: 20 mmol via INTRAVENOUS
  Filled 2018-05-12: qty 6.67

## 2018-05-12 MED ORDER — OSMOLITE 1.2 CAL PO LIQD
1000.0000 mL | ORAL | Status: DC
  Administered 2018-05-12: 1000 mL
  Filled 2018-05-12: qty 1000

## 2018-05-12 MED ORDER — MAGNESIUM SULFATE 4 GM/100ML IV SOLN
4.0000 g | Freq: Once | INTRAVENOUS | Status: AC
  Administered 2018-05-12: 4 g via INTRAVENOUS
  Filled 2018-05-12: qty 100

## 2018-05-12 NOTE — Progress Notes (Signed)
HR is 118. HR fluctuates. Pt has had NV intermittently through out this morning and unable to tolerated po meds or fluids. Currently resting quietly with no s/o or c/o of nausea.

## 2018-05-12 NOTE — Progress Notes (Signed)
  Progress Note: General Surgery Service   Assessment/Plan: Active Problems:   Pancreatic cancer metastasized to liver [facility])  s/p Procedure(s): LAPAROSCOPY DIAGNOSTIC AND J TUBE PLACEMENT (FEEDING TUBE) 05/09/2018  Pancreatic cancer stage IV, liver mets.-follow-up with Dr. Grayland Ormond next week to discuss next steps  Persistent nausea/vomiting- no related to tube feeds, continue multimodal medications, replace electrolytes Hypophosphatemia- replace with KPO4 Hypomagnesemia- replace with mag sulfate Severe protein cal malnutrition- increase tube feeds to 30mg /h  PT consult for severe protein calorie malnutrition Case management consult to get [facility] set up.  Will need HH J-tube feeds and probably HH PT Hypertension-Home meds   LOS: 3 days  Chief Complaint/Subjective: Still nauseated and vomited multiple times this morning  Objective: Vital signs in last 24 hours: Temp:  [98.4 F (36.9 C)] 98.4 F (36.9 C) (03/22 0540) Pulse Rate:  [97-111] 111 (03/22 0540) Resp:  [14-16] 15 (03/22 0540) BP: (147-155)/(72-84) 155/84 (03/22 0540) SpO2:  [95 %-96 %] 95 % (03/22 0540) Weight:  [56.2 kg] 56.2 kg (03/22 0540) Last BM Date: 05/11/18  Intake/Output from previous day: 03/21 0701 - 03/22 0700 In: 285 [NG/GT:165] Out: 900 [Urine:900] Intake/Output this shift: No intake/output data recorded.  Lungs: nonlabored  Cardiovascular: RRR  Abd: soft, NT, ND, tube in position and functional  Extremities: no edema  Neuro: AOx4  Lab Results: CBC  Recent Labs    05/11/18 0332 05/12/18 0412  WBC 12.1* 10.5  HGB 8.6* 8.4*  HCT 27.8* 27.8*  PLT 276 260   BMET Recent Labs    05/11/18 0332 05/12/18 0412  NA 137 138  K 3.8 3.7  CL 106 105  CO2 25 25  GLUCOSE 128* 116*  BUN <5* 9  CREATININE 0.90 0.94  CALCIUM 8.3* 8.3*   PT/INR No results for input(s): LABPROT, INR in the last 72 hours. ABG No results for input(s): PHART, HCO3 in the last 72 hours.  Invalid input(s):  PCO2, PO2  Studies/Results:  Anti-infectives: Anti-infectives (From admission, onward)   Start     Dose/Rate Route Frequency Ordered Stop   05/09/18 2000  ciprofloxacin (CIPRO) IVPB 400 mg     400 mg 200 mL/hr over 60 Minutes Intravenous Every 12 hours 05/09/18 1136 05/09/18 2101   05/09/18 0600  ciprofloxacin (CIPRO) IVPB 400 mg     400 mg 200 mL/hr over 60 Minutes Intravenous On call to O.R. 05/09/18 0546 05/09/18 0810      Medications: Scheduled Meds: . enoxaparin (LOVENOX) injection  40 mg Subcutaneous Q24H  . feeding supplement (OSMOLITE 1.2 CAL)  1,000 mL Per Tube Q24H  . fentaNYL  1 patch Transdermal Q72H  . folic acid  1 mg Oral Daily  . gabapentin  300 mg Oral BID  . insulin aspart  0-9 Units Subcutaneous Q4H  . lipase/protease/amylase  36,000-72,000 Units Oral TID WC  . losartan  50 mg Oral Daily  . potassium chloride  20 mEq Oral Daily   Continuous Infusions: . magnesium sulfate 1 - 4 g bolus IVPB     PRN Meds:.acetaminophen **OR** acetaminophen, diphenhydrAMINE **OR** diphenhydrAMINE, hydrALAZINE, HYDROmorphone (DILAUDID) injection, lidocaine-prilocaine, loperamide, methocarbamol, ondansetron **OR** ondansetron (ZOFRAN) IV, oxyCODONE, promethazine, sodium chloride flush  Mickeal Skinner, MD [facility]

## 2018-05-12 NOTE — Progress Notes (Signed)
Pt triggered yellow mews for heart rate of 111, not an acute change, patient has been running tachy this admission (88-107bpm), patient also actively vomiting currently, will monitor

## 2018-05-12 NOTE — Progress Notes (Signed)
PT Cancellation Note  Patient Details Name: Cindy Bowman MRN: [id number] DOB: [birth year]   Cancelled Treatment:    Reason Eval/Treat Not Completed: Medical issues which prohibited therapy; 4th or 5th attempt to see pt, she is unable to mobilize with PT d/t severe and constant N/V; pt is getting up to [facility] with nursing staff, pt encouraged to continue this;  PT will sign off at this time, if N/V resolves or becomes controlled to the point where pt feels she can mobilize, please re-consult PT. Thank you.  Kenyon Ana, PT  Pager: [phone] Acute Rehab Dept [facility]): [phone]   05/12/2018    [facility] 05/12/2018, 12:47 PM

## 2018-05-12 NOTE — Progress Notes (Deleted)
[place]  [phone] [phone]  ID: [name] OB: 1950-04-03  MR#: [id number]  YCX#:[id number]  Patient Care Team: Glendon Axe, MD as PCP - General (Internal Medicine) Clent Jacks, RN as Registered Nurse Lloyd Huger, MD as Medical Oncologist (Medical Oncology)  CHIEF COMPLAINT: Clinical stage Ib pancreatic adenocarcinoma.  INTERVAL HISTORY: Patient returns to clinic today for repeat laboratory work and further evaluation.  She states her pain is better controlled today.  She was evaluated by surgery last week and is agreed to J-tube placement on Thursday. She continues to have chronic weakness and fatigue.  She has no neurologic complaints.  She denies any recent fevers.  She continues to have a poor appetite and weight loss.  She has no chest pain or shortness of breath.  She denies any nausea, vomiting, constipation, or diarrhea.  She has no urinary complaints.  Patient offers no further specific complaints today.  REVIEW OF SYSTEMS:   Review of Systems  Constitutional: Positive for malaise/fatigue and weight loss. Negative for fever.  HENT: Negative.  Negative for congestion and sinus pain.   Respiratory: Negative.  Negative for cough and shortness of breath.   Cardiovascular: Negative for chest pain and leg swelling.  Gastrointestinal: Positive for abdominal pain. Negative for blood in stool, diarrhea, melena, nausea and vomiting.  Genitourinary: Negative.  Negative for dysuria.  Musculoskeletal: Negative.  Negative for back pain.  Skin: Negative.  Negative for rash.  Neurological: Positive for weakness. Negative for dizziness, focal weakness and headaches.  Psychiatric/Behavioral: Negative.  The patient is not nervous/anxious.     As per HPI. Otherwise, a complete review of systems is negative.  PAST MEDICAL HISTORY: Past Medical History:  Diagnosis Date  . Anemia   . Complication of anesthesia   . Difficult airway  for intubation    2018 Oophorectomy, CRNA unable to visualized vocal cords with MAC 3 bladed. Easy ventilations O2 sats 99%. Rolls placed under the shoulders and pillows under the head Repeat Laryngoscopy with MAC 3 blade > Visualization not much better. O2 sats 99%. Intubation with Glidescope on the Second Attempt. BBSE good ETCO 2 O 2 sats 99%.   . Essential hypertension   . Pancreatic cancer ([place])   . PONV (postoperative nausea and vomiting)   . UTI (urinary tract infection)     PAST SURGICAL HISTORY: Past Surgical History:  Procedure Laterality Date  . CESAREAN SECTION    . CHOLECYSTECTOMY    . EUS N/A 10/25/2017   Procedure: FULL UPPER ENDOSCOPIC ULTRASOUND (EUS) RADIAL;  Surgeon: Jola Schmidt, MD;  Location: [facility] ENDOSCOPY;  Service: Endoscopy;  Laterality: N/A;  . IR RADIOLOGIST EVAL & MGMT  05/08/2018  . LAPAROSCOPY N/A 05/09/2018   Procedure: LAPAROSCOPY DIAGNOSTIC AND J TUBE PLACEMENT (FEEDING TUBE);  Surgeon: Stark Klein, MD;  Location: [facility] ORS;  Service: General;  Laterality: N/A;  . OOPHORECTOMY    . PORTA CATH INSERTION N/A 11/12/2017   Procedure: PORTA CATH INSERTION;  Surgeon: Algernon Huxley, MD;  Location: [place] CV LAB;  Service: Cardiovascular;  Laterality: N/A;  . TUBAL LIGATION      FAMILY HISTORY: Family History  Problem Relation Age of Onset  . Hypertension Mother        deceased 6  . Heart attack Father        deceased late 48s    ADVANCED DIRECTIVES (Y/N):  N  HEALTH MAINTENANCE: Social History   Tobacco Use  . Smoking status: Never Smoker  .  Smokeless tobacco: Never Used  Substance Use Topics  . Alcohol use: Not Currently  . Drug use: Never     Colonoscopy:  PAP:  Bone density:  Lipid panel:  Allergies  Allergen Reactions  . Nsaids Other (See Comments)    Decreased GFR  . Penicillins     Yeast infection Did it involve swelling of the face/tongue/throat, SOB, or low BP? No Did it involve sudden or severe rash/hives, skin peeling, or  any reaction on the inside of your mouth or nose? No Did you need to seek medical attention at a hospital or doctor's office? No When did it last happen?Unknown If all above answers are "NO", may proceed with cephalosporin use.      No current facility-administered medications for this visit.    Current Outpatient Medications  Medication Sig Dispense Refill  . Nutritional Supplements (FEEDING SUPPLEMENT, OSMOLITE 1.2 CAL,) LIQD 50 ml/hr for 24 hours, then cycle. 36000 mL 3  . tiZANidine (ZANAFLEX) 2 MG tablet Take 1 tablet (2 mg total) by mouth every 8 (eight) hours as needed for muscle spasms. 30 tablet 0   Facility-Administered Medications Ordered in Other Visits  Medication Dose Route Frequency Provider Last Rate Last Dose  . acetaminophen (TYLENOL) tablet 650 mg  650 mg Oral Q6H PRN Stark Klein, MD       Or  . acetaminophen (TYLENOL) suppository 650 mg  650 mg Rectal Q6H PRN Stark Klein, MD      . diphenhydrAMINE (BENADRYL) 12.5 MG/5ML elixir 12.5 mg  12.5 mg Oral Q6H PRN Stark Klein, MD       Or  . diphenhydrAMINE (BENADRYL) injection 12.5 mg  12.5 mg Intravenous Q6H PRN Stark Klein, MD      . enoxaparin (LOVENOX) injection 40 mg  40 mg Subcutaneous Q24H Stark Klein, MD   40 mg at 05/12/18 0839  . feeding supplement (OSMOLITE 1.2 CAL) liquid 1,000 mL  1,000 mL Per Tube Q24H Kinsinger, Arta Bruce, MD 30 mL/hr at 05/12/18 1735 1,000 mL at 05/12/18 1735  . fentaNYL (DURAGESIC) 75 MCG/HR 1 patch  1 patch Transdermal Q72H Stark Klein, MD   1 patch at 05/10/18 1038  . folic acid (FOLVITE) tablet 1 mg  1 mg Oral Daily Stark Klein, MD   1 mg at 05/10/18 1037  . gabapentin (NEURONTIN) capsule 300 mg  300 mg Oral BID Stark Klein, MD   300 mg at 05/10/18 2106  . hydrALAZINE (APRESOLINE) injection 10 mg  10 mg Intravenous Q2H PRN Stark Klein, MD      . HYDROmorphone (DILAUDID) injection 0.5 mg  0.5 mg Intravenous Q2H PRN Fanny Skates, MD   0.5 mg at 05/11/18 1459  .  insulin aspart (novoLOG) injection 0-9 Units  0-9 Units Subcutaneous Q4H Ileana Roup, MD   1 Units at 05/12/18 1635  . lidocaine-prilocaine (EMLA) cream 1 application  1 application Topical PRN Stark Klein, MD      . lipase/protease/amylase (CREON) capsule 36,000-72,000 Units  36,000-72,000 Units Oral TID WC Stark Klein, MD   Stopped at 05/11/18 [phone]  . loperamide (IMODIUM) capsule 2 mg  2 mg Oral QID PRN Stark Klein, MD      . losartan (COZAAR) tablet 50 mg  50 mg Oral Daily Stark Klein, MD   50 mg at 05/10/18 1037  . methocarbamol (ROBAXIN) tablet 500 mg  500 mg Oral Q6H PRN Stark Klein, MD      . ondansetron (ZOFRAN-ODT) disintegrating tablet 4 mg  4 mg Oral  Q6H PRN Stark Klein, MD       Or  . ondansetron [facility]) injection 4 mg  4 mg Intravenous Q6H PRN Stark Klein, MD   4 mg at 05/12/18 1635  . oxyCODONE (Oxy IR/ROXICODONE) immediate release tablet 15-30 mg  15-30 mg Oral Q4H PRN Stark Klein, MD      . potassium chloride SA (K-DUR,KLOR-CON) CR tablet 20 mEq  20 mEq Oral Daily Stark Klein, MD      . potassium PHOSPHATE 20 mmol in dextrose 5 % 500 mL infusion  20 mmol Intravenous Once Kinsinger, Arta Bruce, MD 85 mL/hr at 05/12/18 1500    . prochlorperazine (COMPAZINE) injection 10 mg  10 mg Intravenous Once Rexene Agent      . promethazine (PHENERGAN) injection 12.5 mg  12.5 mg Intravenous Q6H PRN Saverio Danker, PA-C   12.5 mg at 05/12/18 1818  . sodium chloride flush (NS) 0.9 % injection 10-40 mL  10-40 mL Intracatheter PRN Stark Klein, MD        OBJECTIVE: There were no vitals filed for this visit.   There is no height or weight on file to calculate BMI.    ECOG FS:0 - Asymptomatic  General: Thin, no acute distress.  Sitting in a wheelchair. Eyes: Pink conjunctiva, anicteric sclera. HEENT: Normocephalic, moist mucous membranes. Lungs: Clear to auscultation bilaterally. Heart: Regular rate and rhythm. No rubs, murmurs, or gallops. Abdomen: Soft, nontender,  nondistended. No organomegaly noted, normoactive bowel sounds. Musculoskeletal: No edema, cyanosis, or clubbing. Neuro: Alert, answering all questions appropriately. Cranial nerves grossly intact. Skin: No rashes or petechiae noted. Psych: Normal affect.  LAB RESULTS:  Lab Results  Component Value Date   NA 138 05/12/2018   K 3.7 05/12/2018   CL 105 05/12/2018   CO2 25 05/12/2018   GLUCOSE 116 (H) 05/12/2018   BUN 9 05/12/2018   CREATININE 0.94 05/12/2018   CALCIUM 8.3 (L) 05/12/2018   PROT 6.8 05/12/2018   ALBUMIN 2.4 (L) 05/12/2018   AST 18 05/12/2018   ALT 14 05/12/2018   ALKPHOS 82 05/12/2018   BILITOT 0.5 05/12/2018   GFRNONAA >60 05/12/2018   GFRAA >60 05/12/2018    Lab Results  Component Value Date   WBC 10.5 05/12/2018   NEUTROABS 5.8 05/07/2018   HGB 8.4 (L) 05/12/2018   HCT 27.8 (L) 05/12/2018   MCV 102.6 (H) 05/12/2018   PLT 260 05/12/2018     STUDIES: Dg Thoracic Spine 2 View  Result Date: 05/01/2018 CLINICAL DATA:  Pancreatic cancer.  Back pain. EXAM: THORACIC SPINE 2 VIEWS COMPARISON:  Chest radiography 03/10/2018 FINDINGS: Very minimal spinal curvature. No significant disc space narrowing. Ordinary marginal osteophytes. No evidence of fracture or destructive lesion. Posteromedial ribs appear normal. IMPRESSION: No definitely significant finding. Ordinary age related changes. Ordinary marginal osteophytes. No sign of fracture or destructive lesion. Electronically Signed   By: Nelson Chimes M.D.   On: 05/01/2018 09:11   Ct Abdomen Pelvis W Contrast  Result Date: 05/10/2018 CLINICAL DATA:  Stage IV pancreatic cancer. Status post laparoscopy and J-tube placement. EXAM: CT ABDOMEN AND PELVIS WITH CONTRAST TECHNIQUE: Multidetector CT imaging of the abdomen and pelvis was performed using the standard protocol following bolus administration of intravenous contrast. CONTRAST:  114m OMNIPAQUE IOHEXOL 300 MG/ML  SOLN COMPARISON:  04/04/2018 FINDINGS: Lower chest:  Patchy bilateral lower lobe opacities, likely atelectasis. Small bilateral pleural effusions. Hepatobiliary: Hepatic steatosis with geographic areas of focal fatty sparing. Possible 5 x 10 mm serosal implant along the  anterior surface of the liver inferiorly along segment 3 (series 2/image 46). Status post cholecystectomy. No intrahepatic or extrahepatic ductal dilatation. Pancreas: Parenchymal atrophy. 2.4 x 3.0 cm hypoenhancing mass in the uncinate process (series 2/image 86), corresponding to the patient's known pancreatic adenocarcinoma, previously 2.4 x 2.8 cm. Adjacent fiducial markers. Large pseudocyst along the pancreatic body/tail measuring 7.2 x 12.7 cm (series 2/image 49), previously 5.0 x 9.8 cm. Additional 4.9 x 6.0 cm complex pseudocyst along the pancreatic tail/splenic hilum (series 2/image 25), previously 3.5 x 2.9 cm. Spleen: Within normal limits. Adrenals/Urinary Tract: Adrenal glands are within normal limits. 2.9 x 2.6 cm enhancing mass in the lateral left upper kidney (series 2/image 83), compatible with solid renal neoplasm, grossly unchanged. Right kidney is within normal limits. No hydronephrosis. Bladder is underdistended but unremarkable. Stomach/Bowel: Stomach is grossly unremarkable. Visualized bowel is notable for a percutaneous jejunostomy in satisfactory position. No evidence of bowel obstruction. Normal appendix (series 4/image 101). Left colonic diverticulosis, without evidence of diverticulitis. Vascular/Lymphatic: No evidence of abdominal aortic aneurysm. No suspicious abdominopelvic lymphadenopathy. Reproductive: Uterus is within normal limits. No adnexal masses. Other: Small volume perihepatic and pelvic ascites. Mild nondependent gas/free air related to recent laparoscopy. Tiny fat/fluid containing bilateral inguinal hernias, right greater than left. Subcutaneous emphysema/fluid along the left lateral abdominal wall, related to recent laparoscopy. Musculoskeletal: Degenerative  changes of the visualized thoracolumbar spine. IMPRESSION: 3.0 cm hypoenhancing mass in the uncinate process, corresponding to the patient's known pancreatic adenocarcinoma, mildly increased. Adjacent fiducial markers. Progressive pseudocysts in the left upper abdomen. Possible new 10 mm serosal implant along the anterior surface of segment 3. 2.9 cm solid renal neoplasm in the left upper kidney, grossly unchanged. Postsurgical changes related to recent laparoscopy, as above. Placement of an indwelling percutaneous jejunostomy in satisfactory position. Electronically Signed   By: Julian Hy M.D.   On: 05/10/2018 14:14   Dg Abd 2 Views  Result Date: 05/11/2018 CLINICAL DATA:  Recent abdominal laparoscopy with J-tube insertion on 05/09/2018. Vomiting status post surgery. History of pancreatic cancer and cholecystectomy. EXAM: ABDOMEN - 2 VIEW COMPARISON:  Plain film of the abdomen dated 04/04/2018. CT abdomen dated 05/10/2018. FINDINGS: Percutaneous jejunostomy catheter overlies the lower abdomen, similar compared to the positioning on CT of 05/10/2018. Overall bowel gas pattern is nonobstructive. No dilated large or small bowel loops seen. No evidence of abnormal fluid collection or free intraperitoneal air seen. Cholecystectomy clips in the RIGHT upper quadrant. No acute appearing osseous abnormality. IMPRESSION: Nonobstructive bowel gas pattern and no evidence of acute intra-abdominal abnormality. Percutaneous jejunostomy catheter overlying the lower abdomen. Electronically Signed   By: Franki Cabot M.D.   On: 05/11/2018 13:00   Ir Radiologist Eval & Mgmt  Result Date: 05/08/2018 Please refer to notes tab for details about interventional procedure. (Op Note)   ASSESSMENT: Clinical stage Ib pancreatic adenocarcinoma.  PLAN:    1.  Clinical stage Ib pancreatic adenocarcinoma: EUS completed on October 25, 2017 confirmed adenocarcinoma.   Patient's CA 19-9 on January 09, 2018 reported at 28.  Neoadjuvant FOLFIRINOX was too toxic for patient, therefore she was switched to single agent gemcitabine. Repeat CT scan on April 04, 2018 reviewed independently with persistent malignancy along with pseudocyst from recent bout of pancreatitis.  No further chemotherapy is planned at this time, but patient will likely require additional treatment in the future.  Patient has now completed XRT.  She is scheduled for J-tube placement later this week.  Return to clinic in 1 week for further  evaluation.   2.  Anemia: Hemoglobin is decreased, but essentially unchanged at 8.9.  Monitor. 3.  Pain: Continue fentanyl patch to 75 mcg every 3 days.  Continue oxycodone and gabapentin.  Patient was also given referral for interventional pain clinic for consideration of celiac plexus block.  Appreciate palliative care input. 4.  Renal mass: CT scan results as above.  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 No intervention is needed at this time.  Continue to monitor while patient is undergoing treatment for her pancreatic cancer.  Patient was previously given a referral to urology.   4.  Genetic: Genetic testing is pending at time of dictation. 5.  Chronic renal insufficiency: Patient's creatinine is 1.11 today.  Monitor. 6.  Hypokalemia: Improved to 3.4.  Continue with potassium powder. 7.  Diarrhea: Patient does not complain of this today.  Continue Lomotil as needed. 8.  Poor appetite/weight loss: Continue Megace as prescribed.  J-tube placement later this week.  Appreciate dietary intake. 9.  Left lower extremity DVT: Confirmed by ultrasound.  Patient has been instructed to hold Eliquis Tuesday, Wednesday, and Thursday of this week and reinitiate treatment Friday.  She will be given Lovenox at 1.5 mg/kg on Tuesday and Wednesday.  No treatment Thursday and then Lovenox again on Friday.  This was relayed to the primary surgical team.  Patient expressed understanding and was in agreement with this plan. She  also understands that She can call clinic at any time with any questions, concerns, or complaints.   Cancer Staging Malignant neoplasm of head of pancreas [facility]) Staging form: Exocrine Pancreas, AJCC 8th Edition - Clinical stage from 10/26/2017: Stage IB (cT2, cN0, cM0) - Signed by Lloyd Huger, MD on 10/26/2017   Lloyd Huger, MD   05/12/2018 7:02 PM

## 2018-05-12 NOTE — Progress Notes (Signed)
Nutrition Follow-up  DOCUMENTATION CODES:   Non-severe (moderate) malnutrition in context of chronic illness  INTERVENTION:   Monitor magnesium, potassium, and phosphorus daily for at least 3 days, MD to replete as needed, as pt is at risk for refeeding syndrome given malnutrition status and current N/V.  -Continue Osmolite 1.2 @ 30 ml/hr via J-tube. -Advance as tolerated to goal rate of 50 ml/hr (meets ~75% of estimated needs). -At goal will provide 1440 kcal, 67 grams of protein, and 984 ml free water.   NEW NUTRITION DIAGNOSIS:   Moderate Malnutrition related to chronic illness, cancer and cancer related treatments as evidenced by percent weight loss, moderate fat depletion, moderate muscle depletion.  GOAL:   Patient will meet greater than or equal to 90% of their needs  Progressing.  MONITOR:   PO intake, Labs, Weight trends, TF tolerance, I & O's  REASON FOR ASSESSMENT:   Consult Enteral/tube feeding initiation and management  ASSESSMENT:   Patient with PMH significant for pancreatic cancer (diagnosed 10/2017) on chemotherapy, renal mass, HTN, severe malnutrition, CKD III, and ventral hernia without obstruction. 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  3/19: s/p Diagnostic laparoscopy, feeding jejunostomy, repair of umbilical hernia.  Patient receiving Osmolite 1.2 @ 20 ml/hr via J-tube. Currently with persistent N/V, which is not related to TF. Surgery has increased TF to 30 ml/hr via J-tube. Will continue to monitor tolerance and advancements. Pt on clear liquids and not tolerating anything PO.  Per weight records, pt has lost 21 lb since 1/8 (14% wt loss x 2.5 months, significant for time frame).  Medications: Folic acid tablet daily, CREON capsule TID, K-DUR tablet daily, IV Mg sulfate, IV K-Phos, IV Zofran PRN, IV Phenergan PRN  Labs reviewed: CBGs: 115-130    NUTRITION - FOCUSED PHYSICAL EXAM:    Most Recent Value  Orbital Region   Moderate depletion  Upper Arm Region  Moderate depletion  Thoracic and Lumbar Region  Unable to assess  Buccal Region  Moderate depletion  Temple Region  Moderate depletion  Clavicle Bone Region  Moderate depletion  Clavicle and Acromion Bone Region  Mild depletion  Scapular Bone Region  Mild depletion  Dorsal Hand  Moderate depletion  Patellar Region  Unable to assess  Anterior Thigh Region  Unable to assess  Posterior Calf Region  Unable to assess  Edema (RD Assessment)  Unable to assess  Hair  Unable to assess  Eyes  Unable to assess  Mouth  Unable to assess  Skin  Unable to assess  Nails  Unable to assess       Diet Order:   Diet Order            Diet clear liquid Room service appropriate? Yes; Fluid consistency: Thin  Diet effective now              EDUCATION NEEDS:   Not appropriate for education at this time  Skin:  Skin Assessment: Skin Integrity Issues: Skin Integrity Issues:: Incisions Incisions: closed abdomen  Last BM:  3/21  Height:   Ht Readings from Last 1 Encounters:  05/09/18 5\' 3"  (1.6 m)    Weight:   Wt Readings from Last 1 Encounters:  05/12/18 56.2 kg    Ideal Body Weight:  52.3 kg  BMI:  Body mass index is 21.95 kg/m.  Estimated Nutritional Needs:   Kcal:  1650-1850 kcal  Protein:  80-100 grams  Fluid:  >/= 1.6 L/day   Clayton Bibles, MS, RD, LDN  [place] Dietitian Pager: [phone] After Hours Pager: [phone]

## 2018-05-13 ENCOUNTER — Other Ambulatory Visit: Payer: Self-pay | Admitting: Student

## 2018-05-13 ENCOUNTER — Telehealth: Payer: Self-pay | Admitting: *Deleted

## 2018-05-13 ENCOUNTER — Telehealth: Payer: Self-pay

## 2018-05-13 ENCOUNTER — Inpatient Hospital Stay: Payer: Medicare Other

## 2018-05-13 ENCOUNTER — Inpatient Hospital Stay: Payer: Medicare Other | Admitting: Oncology

## 2018-05-13 ENCOUNTER — Other Ambulatory Visit: Payer: Self-pay | Admitting: *Deleted

## 2018-05-13 LAB — GLUCOSE, CAPILLARY
Glucose-Capillary: 109 mg/dL — ABNORMAL HIGH (ref 70–99)
Glucose-Capillary: 123 mg/dL — ABNORMAL HIGH (ref 70–99)
Glucose-Capillary: 124 mg/dL — ABNORMAL HIGH (ref 70–99)
Glucose-Capillary: 130 mg/dL — ABNORMAL HIGH (ref 70–99)
Glucose-Capillary: 138 mg/dL — ABNORMAL HIGH (ref 70–99)
Glucose-Capillary: 142 mg/dL — ABNORMAL HIGH (ref 70–99)

## 2018-05-13 LAB — BPAM RBC

## 2018-05-13 LAB — TYPE AND SCREEN: UNIT DIVISION: 0

## 2018-05-13 LAB — PHOSPHORUS: Phosphorus: 4.9 mg/dL — ABNORMAL HIGH (ref 2.5–4.6)

## 2018-05-13 LAB — BASIC METABOLIC PANEL
Anion gap: 10 (ref 5–15)
BUN: 13 mg/dL (ref 8–23)
CO2: 25 mmol/L (ref 22–32)
CREATININE: 1.1 mg/dL — AB (ref 0.44–1.00)
Calcium: 8.2 mg/dL — ABNORMAL LOW (ref 8.9–10.3)
Chloride: 101 mmol/L (ref 98–111)
GFR calc Af Amer: 59 mL/min — ABNORMAL LOW (ref >60)
GFR calc non Af Amer: 51 mL/min — ABNORMAL LOW (ref >60)
GLUCOSE: 138 mg/dL — AB (ref 70–99)
Potassium: 4.3 mmol/L (ref 3.5–5.1)
Sodium: 136 mmol/L (ref 135–145)

## 2018-05-13 LAB — MAGNESIUM: Magnesium: 2.7 mg/dL — ABNORMAL HIGH (ref 1.7–2.4)

## 2018-05-13 MED ORDER — OSMOLITE 1.2 CAL PO LIQD
1000.0000 mL | ORAL | Status: DC
  Administered 2018-05-13 – 2018-05-14 (×2): 1000 mL
  Filled 2018-05-13: qty 1000

## 2018-05-13 MED ORDER — PROCHLORPERAZINE EDISYLATE 10 MG/2ML IJ SOLN
10.0000 mg | Freq: Four times a day (QID) | INTRAMUSCULAR | Status: DC | PRN
  Administered 2018-05-14 – 2018-05-16 (×6): 10 mg via INTRAVENOUS
  Filled 2018-05-13 (×6): qty 2

## 2018-05-13 MED ORDER — ENOXAPARIN SODIUM 60 MG/0.6ML ~~LOC~~ SOLN
50.0000 mg | Freq: Two times a day (BID) | SUBCUTANEOUS | Status: DC
  Administered 2018-05-14: 50 mg via SUBCUTANEOUS
  Filled 2018-05-13: qty 0.6

## 2018-05-13 NOTE — Telephone Encounter (Signed)
05/17/18 at 4 pm appt with Dr Rush Landmark The patient has been notified of this information and all questions answered.

## 2018-05-13 NOTE — Telephone Encounter (Signed)
Rinell called reporting that patient is in hospital and needs to speak with Dr Grayland Ormond Asking Dr return call [phone]. Then he called again asking for Dr Grayland Ormond to arrange nerve block fo rpt while she is in the hospital

## 2018-05-13 NOTE — Progress Notes (Signed)
  Progress Note: General Surgery Service   Assessment/Plan: Active Problems:   Pancreatic cancer metastasized to liver [facility])  s/p Procedure(s): LAPAROSCOPY DIAGNOSTIC AND J TUBE PLACEMENT (FEEDING TUBE) 05/09/2018  Pancreatic cancer stage IV, liver mets.-follow-up with Dr. Grayland Ormond next week to discuss next steps  Persistent nausea/vomiting- not related to tube feeds, continue multimodal medications, replace electrolytes (stable today).  Likely related to enlarging pancreatic pseudocyst.   Hypophosphatemia- replace with KPO4 Hypomagnesemia- replace with mag sulfate Severe protein cal malnutrition- increase tube feeds to to goal.    PT consult for severe deconditioning. Case management consult to get [facility] set up.  Will need HH J-tube feeds and probably HH PT Hypertension-Home meds   LOS: 4 days  Chief Complaint/Subjective: Still nauseated, but yellow/clear, NOT tube feeds.  + flatus, + BM  Objective: Vital signs in last 24 hours: Temp:  [98.3 F (36.8 C)-99.5 F (37.5 C)] 99.1 F (37.3 C) (03/23 0416) Pulse Rate:  [96-118] 114 (03/23 0416) Resp:  [14-16] 16 (03/23 0416) BP: (124-152)/(53-87) 137/71 (03/23 0416) SpO2:  [98 %-100 %] 100 % (03/23 0416) Last BM Date: 05/13/18  Intake/Output from previous day: 03/22 0701 - 03/23 0700 In: 406.4 [NG/GT:200; IV Piggyback:206.4] Out: 1851 [Urine:200; Emesis/NG output:1650; Stool:1] Intake/Output this shift: No intake/output data recorded.  Lungs: nonlabored  Cardiovascular: RRR  Abd: soft, NT, epigastric distention, but not lower abdominal distention, tube in position and functional  Extremities: no edema  Neuro: AOx4  Lab Results: CBC  Recent Labs    05/11/18 0332 05/12/18 0412  WBC 12.1* 10.5  HGB 8.6* 8.4*  HCT 27.8* 27.8*  PLT 276 260   BMET Recent Labs    05/12/18 0412 05/13/18 0542  NA 138 136  K 3.7 4.3  CL 105 101  CO2 25 25  GLUCOSE 116* 138*  BUN 9 13  CREATININE 0.94 1.10*  CALCIUM 8.3* 8.2*    PT/INR No results for input(s): LABPROT, INR in the last 72 hours. ABG No results for input(s): PHART, HCO3 in the last 72 hours.  Invalid input(s): PCO2, PO2  Studies/Results:  Anti-infectives: Anti-infectives (From admission, onward)   Start     Dose/Rate Route Frequency Ordered Stop   05/09/18 2000  ciprofloxacin (CIPRO) IVPB 400 mg     400 mg 200 mL/hr over 60 Minutes Intravenous Every 12 hours 05/09/18 1136 05/09/18 2101   05/09/18 0600  ciprofloxacin (CIPRO) IVPB 400 mg     400 mg 200 mL/hr over 60 Minutes Intravenous On call to O.R. 05/09/18 0546 05/09/18 0810      Medications: Scheduled Meds: . enoxaparin (LOVENOX) injection  40 mg Subcutaneous Q24H  . feeding supplement (OSMOLITE 1.2 CAL)  1,000 mL Per Tube Q24H  . fentaNYL  1 patch Transdermal Q72H  . folic acid  1 mg Oral Daily  . gabapentin  300 mg Oral BID  . insulin aspart  0-9 Units Subcutaneous Q4H  . lipase/protease/amylase  36,000-72,000 Units Oral TID WC  . losartan  50 mg Oral Daily  . potassium chloride  20 mEq Oral Daily   Continuous Infusions:  PRN Meds:.acetaminophen **OR** acetaminophen, diphenhydrAMINE **OR** diphenhydrAMINE, hydrALAZINE, HYDROmorphone (DILAUDID) injection, lidocaine-prilocaine, loperamide, methocarbamol, ondansetron **OR** ondansetron (ZOFRAN) IV, oxyCODONE, promethazine, sodium chloride flush  Stark Klein, MD [facility]

## 2018-05-13 NOTE — Telephone Encounter (Signed)
error 

## 2018-05-13 NOTE — Telephone Encounter (Signed)
-----   Message from Irving Copas., MD sent at 05/12/2018 11:14 PM EDT ----- Regarding: RE: Chong Sicilian, This is a patient that I would like to do a telehealth visit for role of possible EUS cystgastrostomy creation. Let's see if we can get her later this week on the televisit schedule so we can plan an EUS in the next 2-3 weeks. Thanks. GM ----- Message ----- From: Stark Klein, MD Sent: 05/12/2018   7:54 PM EDT To: Irving Copas., MD Subject: RE:                                            Yes I was thinking outpatient.  FB ----- Message ----- From: Irving Copas., MD Sent: 05/12/2018  12:54 PM EDT To: Stark Klein, MD  Dorris Fetch, Thanks for thinking about an endoscopic approach.If I have a history correct, and you can update me if necessary, looks like she developed some neck pancreatitis post fiducials placement.The enlarging cysts do look to have a mature wall and could benefit from an endoscopic drainage attempt.  I am a bit concerned that the cysts are continuing to enlarge now 2 months post EUS with fiducial placement but this could be concerning for having developed a pancreatic duct leak.  If she is having mostly symptoms of nausea and early satiety and weight loss then I do think she will benefit from a potential attempt of draining the larger fluid collection first, I would try not to do the one at the splenic hilum at this time.I do not see much in regards to necrosis as had been initially concerned on the first CT scan she had after she had the fiducials placed.We are now moving away from inpatient consultations but I be happy to try and get her on my schedule to discuss a cyst gastrostomy attempt and then we can work on trying to schedule that.Let me know what you think.Thanks.GM ----- Message ----- From: Stark Klein, MD Sent: 05/10/2018   2:10 PM EDT To: Irving Copas., MD  Can you look at this lady's scan?  She has an enlarging pseudocyst from  pancreatitis and now metastatic pancreatic cancer.  What do you think about endoscopic cyst gastrostomy on her?    Tx FB

## 2018-05-13 NOTE — Plan of Care (Signed)

## 2018-05-13 NOTE — Care Management Important Message (Signed)
Important Message  Patient Details  Name: Cindy Bowman MRN: [id number] Date of Birth: January 28, 1951   Medicare Important Message Given:  Yes    Kerin Salen 05/13/2018, 11:31 [place] Message  Patient Details  Name: Cindy Bowman MRN: [id number] Date of Birth: 1950/12/17   Medicare Important Message Given:  Yes    Kerin Salen 05/13/2018, 11:30 AMImportant Message  Patient Details  Name: Cindy Bowman MRN: [id number] Date of Birth: 10/12/1950   Medicare Important Message Given:  Yes    Kerin Salen 05/13/2018, 11:30 AM

## 2018-05-14 ENCOUNTER — Ambulatory Visit: Admit: 2018-05-14 | Payer: Medicare Other

## 2018-05-14 DIAGNOSIS — C787 Secondary malignant neoplasm of liver and intrahepatic bile duct: Secondary | ICD-10-CM

## 2018-05-14 DIAGNOSIS — C259 Malignant neoplasm of pancreas, unspecified: Principal | ICD-10-CM

## 2018-05-14 LAB — GLUCOSE, CAPILLARY
Glucose-Capillary: 122 mg/dL — ABNORMAL HIGH (ref 70–99)
Glucose-Capillary: 124 mg/dL — ABNORMAL HIGH (ref 70–99)
Glucose-Capillary: 125 mg/dL — ABNORMAL HIGH (ref 70–99)
Glucose-Capillary: 127 mg/dL — ABNORMAL HIGH (ref 70–99)
Glucose-Capillary: 132 mg/dL — ABNORMAL HIGH (ref 70–99)
Glucose-Capillary: 137 mg/dL — ABNORMAL HIGH (ref 70–99)
Glucose-Capillary: 137 mg/dL — ABNORMAL HIGH (ref 70–99)

## 2018-05-14 LAB — CBC
HCT: 30.1 % — ABNORMAL LOW (ref 36.0–46.0)
Hemoglobin: 9.5 g/dL — ABNORMAL LOW (ref 12.0–15.0)
MCH: 31.9 pg (ref 26.0–34.0)
MCHC: 31.6 g/dL (ref 30.0–36.0)
MCV: 101 fL — ABNORMAL HIGH (ref 80.0–100.0)
Platelets: 307 10*3/uL (ref 150–400)
RBC: 2.98 MIL/uL — ABNORMAL LOW (ref 3.87–5.11)
RDW: 16.4 % — ABNORMAL HIGH (ref 11.5–15.5)
WBC: 8.5 10*3/uL (ref 4.0–10.5)
nRBC: 0 % (ref 0.0–0.2)

## 2018-05-14 LAB — APTT: APTT: 40 s — AB (ref 24–36)

## 2018-05-14 LAB — PROTIME-INR
INR: 1.1 (ref 0.8–1.2)
Prothrombin Time: 14.3 seconds (ref 11.4–15.2)

## 2018-05-14 MED ORDER — OSMOLITE 1.2 CAL PO LIQD
1000.0000 mL | ORAL | Status: DC
  Administered 2018-05-14: 1000 mL
  Filled 2018-05-14 (×2): qty 1000

## 2018-05-14 MED ORDER — OSMOLITE 1.2 CAL PO LIQD: 1000.0000 mL | ORAL | Status: DC

## 2018-05-14 MED ORDER — OSMOLITE 1.2 CAL PO LIQD
1000.0000 mL | ORAL | Status: DC
  Filled 2018-05-14: qty 1000

## 2018-05-14 MED ORDER — HEPARIN (PORCINE) 25000 UT/250ML-% IV SOLN
900.0000 [IU]/h | INTRAVENOUS | Status: AC
  Administered 2018-05-14: 900 [IU]/h via INTRAVENOUS
  Filled 2018-05-14: qty 250

## 2018-05-14 MED ORDER — PROMETHAZINE HCL 25 MG RE SUPP: 12.5000 mg | Freq: Four times a day (QID) | RECTAL | Status: DC | PRN

## 2018-05-14 MED ORDER — HEPARIN (PORCINE) 25000 UT/250ML-% IV SOLN: 900.0000 [IU]/h | INTRAVENOUS | Status: DC

## 2018-05-14 MED ORDER — OXYCODONE HCL 5 MG/5ML PO SOLN
5.0000 mg | Freq: Three times a day (TID) | ORAL | Status: DC
  Administered 2018-05-14 – 2018-05-17 (×7): 5 mg
  Filled 2018-05-14 (×10): qty 5

## 2018-05-14 NOTE — Consult Note (Addendum)
Referring Provider: Dr. Stark Klein Primary Care Physician:  Glendon Axe, MD Primary Gastroenterologist:  Dr. Rush Landmark   Reason for Consultation:  Pancreatic adenocarcinoma stage Ib with metastasis to the liver,  pancreatic pseudocyst for possible cyst gastrostomy  HPI: Cindy Bowman is a 68 y.o. female with a past medical history significant for HTN, stage Ib pancreatic adenocarcinoma diagnosed September 2019, admitted to [facility] with severe necrotizing pancreatitis 1/ 2020, readmitted with pancreatitis 03/2018. History of LLE DVT 12/2017 (? secondary to chemo on Eliquis 22m bid, last dose was on 05/06/2018). She is followed by oncologist, Dr. FGrayland Ormond She received chemo Aug through Dec. 2019.  Past surgical history: Status post cholecystectomy 06/2017, S/P  laparoscopic J-tube placement and repair of umbilical hernia by Dr. FStark Klein3/19/2020, oophorectomy 2018, cesarean section and tubal ligation.   [facility] due to failure to thrive, constant nausea s/p laparoscopic J-tube placement by Dr. FStark Klein  Labs 05/10/2018: Sodium 135.  Potassium 3.7.  Chloride 104.  CO2 23.  Glucose 128.  BUN less than 5.  Creatinine 0.96.  Calcium 8.0.  Phosphorus 2.7.  Magnesium 1.3.  WBC 11.4.  Hemoglobin 7.8.  Hematocrit 25.3.  MCV 102.4.  Platelet 260. Labs 05/12/2018: Alk phos 82.  Albumin 2.4.  Lipase 43.  AST 18.  ALT 14.  Total protein 6.8. Total bilirubin 0.5.  WBC 10.5.  Hemoglobin 8.4.  Hematocrit 27.8.  MCV 102.6.  Platelet 260. 05/07/2018: INR 1.2.  Abdominal/pelvic CT 05/10/2018: Hepatic steatosis with geographic areas of focal fatty sparing, possible 5 x 10 mm serosal implant along the anterior surface of the liver inferiorly along segment 3. Pancreas: Parenchymal atrophy. 2.4 x 3.0 cm hypoenhancing mass in the uncinate process corresponding to the patient's known pancreatic adenocarcinoma, previously 2.4 x 2.8 cm. Adjacent fiducial markers.   Status post cholecystectomy, no intrahepatic or extrahepatic ductal dilatation. Large pseudocyst along the pancreatic body/tail measuring 7.2 x 12.7 cm, previously 5.0 x 9.8 cm. Additional 4.9 x 6.0 cm complex pseudocyst along the pancreatic tail/splenic hilum, previously 3.5 x 2.9 cm. A 2.9 x 2.6 cm enhancing mass in the lateral left upper kidney, compatible with solid renal neoplasm, grossly unchanged. Right kidney is within normal limits. No hydronephrosis. Visualized bowel is notable for a percutaneous jejunostomy in satisfactory position. No evidence of bowel obstruction. Possible new 10 mm serosal implant along the anterior surface of segment 3. A 2.9 cm solid renal neoplasm in the left upper kidney, grossly unchanged.  EUS 10/25/2017 A mass was identified in the uncinate process of the pancreas, bx + adenocarcinoma.   Past Medical History:  Diagnosis Date  . Anemia   . Complication of anesthesia   . Difficult airway for intubation    2018 Oophorectomy, CRNA unable to visualized vocal cords with MAC 3 bladed. Easy ventilations O2 sats 99%. Rolls placed under the shoulders and pillows under the head Repeat Laryngoscopy with MAC 3 blade > Visualization not much better. O2 sats 99%. Intubation with Glidescope on the Second Attempt. BBSE good ETCO 2 O 2 sats 99%.   . Essential hypertension   . [place]   . PONV (postoperative nausea and vomiting)   . UTI (urinary tract infection)     Past Surgical History:  Procedure Laterality Date  . CESAREAN SECTION    . CHOLECYSTECTOMY    . EUS N/A 10/25/2017   Procedure: FULL UPPER ENDOSCOPIC ULTRASOUND (EUS) RADIAL;  Surgeon: JJola Schmidt MD;  [facility];  Service: Endoscopy;  Laterality: N/A;  . IR RADIOLOGIST EVAL & MGMT  05/08/2018  . LAPAROSCOPY N/A 05/09/2018   Procedure: LAPAROSCOPY DIAGNOSTIC AND J TUBE PLACEMENT (FEEDING TUBE);  Surgeon: Stark Klein, MD;  Location: [facility];  Service: General;  Laterality: N/A;  .  OOPHORECTOMY    . PORTA CATH INSERTION N/A 11/12/2017   Procedure: PORTA CATH INSERTION;  Surgeon: Algernon Huxley, MD;  [place] [facility];  Service: Cardiovascular;  Laterality: N/A;  . TUBAL LIGATION      Prior to Admission medications   Medication Sig Start Date End Date Taking? Authorizing Provider  apixaban (ELIQUIS) 5 MG TABS tablet Take 1 tablet (5 mg total) by mouth 2 (two) times daily. 05/03/18  Yes Lloyd Huger, MD  fentaNYL (DURAGESIC) 75 MCG/HR Place 1 patch onto the skin every 3 (three) days. 04/30/18  Yes Borders, Kirt Boys, NP  folic acid (FOLVITE) 1 MG tablet TAKE 1 TABLET BY MOUTH EVERY DAY Patient taking differently: Take 1 mg by mouth daily.  04/12/18  Yes Verlon Au, NP  gabapentin (NEURONTIN) 300 MG capsule Take 1 capsule (300 mg total) by mouth at bedtime. 05/01/18  Yes Borders, Kirt Boys, NP  lidocaine-prilocaine (EMLA) cream Apply 1 application topically as needed. Patient taking differently: Apply 1 application topically as needed (port access).  04/30/18  Yes Lloyd Huger, MD  lipase/protease/amylase (CREON) 36000 UNITS CPEP capsule Take 32,202-54,270 Units by mouth See admin instructions. Take 36000 units with small meals and 72000 units with big meals   Yes [provider]  losartan (COZAAR) 50 MG tablet Take 1 tablet (50 mg total) by mouth daily. 11/19/17  Yes Vaughan Basta, MD  NARCAN 4 MG/0.1ML LIQD nasal spray kit Place 1 spray into the nose as needed (opioid overdose).  03/21/18  Yes [provider]  oxyCODONE (ROXICODONE) 15 MG immediate release tablet Take 1-2 tablets (15-30 mg total) by mouth every 4 (four) hours as needed for pain. 05/03/18  Yes Borders, Kirt Boys, NP  potassium chloride (KLOR-CON SPRINKLE) 10 MEQ CR capsule Take 2 capsules (20 mEq total) by mouth daily. May open capsule to sprinkle on food. 04/30/18  Yes Lloyd Huger, MD  promethazine (PHENERGAN) 12.5 MG tablet Take 12.5 mg by mouth every 6  (six) hours as needed for nausea or vomiting.   Yes [provider]  loperamide (IMODIUM) 2 MG capsule Take 2 mg by mouth as needed for diarrhea or loose stools.     [provider]  Nutritional Supplements (FEEDING SUPPLEMENT, OSMOLITE 1.2 CAL,) LIQD 50 ml/hr for 24 hours, then cycle. 05/10/18   Stark Klein, MD  tiZANidine (ZANAFLEX) 2 MG tablet Take 1 tablet (2 mg total) by mouth every 8 (eight) hours as needed for muscle spasms. 05/10/18   Stark Klein, MD    Current Facility-Administered Medications  Medication Dose Route Frequency Provider Last Rate Last Dose  . acetaminophen (TYLENOL) tablet 650 mg  650 mg Oral Q6H PRN Stark Klein, MD       Or  . acetaminophen (TYLENOL) suppository 650 mg  650 mg Rectal Q6H PRN Stark Klein, MD      . diphenhydrAMINE (BENADRYL) 12.5 MG/5ML elixir 12.5 mg  12.5 mg Oral Q6H PRN Stark Klein, MD       Or  . diphenhydrAMINE (BENADRYL) injection 12.5 mg  12.5 mg Intravenous Q6H PRN Stark Klein, MD      . enoxaparin (LOVENOX) injection 50 mg  50 mg Subcutaneous Q12H Byerly,  Dorris Fetch, MD   50 mg at 05/14/18 0913  . feeding supplement (OSMOLITE 1.2 CAL) liquid 1,000 mL  1,000 mL Per Tube Q24H Stark Klein, MD      . fentaNYL (DURAGESIC) 75 MCG/HR 1 patch  1 patch Transdermal Q72H Stark Klein, MD   1 patch at 05/13/18 [phone]  . folic acid (FOLVITE) tablet 1 mg  1 mg Oral Daily Stark Klein, MD   1 mg at 05/14/18 0911  . gabapentin (NEURONTIN) capsule 300 mg  300 mg Oral BID Stark Klein, MD   300 mg at 05/14/18 0911  . hydrALAZINE (APRESOLINE) injection 10 mg  10 mg Intravenous Q2H PRN Stark Klein, MD      . HYDROmorphone (DILAUDID) injection 0.5 mg  0.5 mg Intravenous Q2H PRN Fanny Skates, MD   0.5 mg at 05/13/18 1842  . insulin aspart (novoLOG) injection 0-9 Units  0-9 Units Subcutaneous Q4H Ileana Roup, MD   1 Units at 05/14/18 0910  . lidocaine-prilocaine (EMLA) cream 1 application  1 application Topical PRN Stark Klein, MD      . lipase/protease/amylase (CREON) capsule 36,000-72,000 Units  36,000-72,000 Units Oral TID WC Stark Klein, MD   36,000 Units at 05/14/18 0912  . loperamide (IMODIUM) capsule 2 mg  2 mg Oral QID PRN Stark Klein, MD      . losartan (COZAAR) tablet 50 mg  50 mg Oral Daily Stark Klein, MD   50 mg at 05/14/18 0912  . methocarbamol (ROBAXIN) tablet 500 mg  500 mg Oral Q6H PRN Stark Klein, MD      . ondansetron (ZOFRAN-ODT) disintegrating tablet 4 mg  4 mg Oral Q6H PRN Stark Klein, MD       Or  . ondansetron (ZOFRAN) injection 4 mg  4 mg Intravenous Q6H PRN Stark Klein, MD   4 mg at 05/14/18 0911  . oxyCODONE (Oxy IR/ROXICODONE) immediate release tablet 15-30 mg  15-30 mg Oral Q4H PRN Stark Klein, MD      . potassium chloride SA (K-DUR,KLOR-CON) CR tablet 20 mEq  20 mEq Oral Daily Stark Klein, MD   20 mEq at 05/13/18 0926  . prochlorperazine (COMPAZINE) injection 10 mg  10 mg Intravenous Q6H PRN Stark Klein, MD   10 mg at 05/14/18 0402  . promethazine (PHENERGAN) injection 12.5 mg  12.5 mg Intravenous Q6H PRN Saverio Danker, PA-C   12.5 mg at 05/14/18 0015  . promethazine (PHENERGAN) suppository 12.5 mg  12.5 mg Rectal Q6H PRN Stark Klein, MD      . sodium chloride flush (NS) 0.9 % injection 10-40 mL  10-40 mL Intracatheter PRN Stark Klein, MD       Facility-Administered Medications Ordered in Other Encounters  Medication Dose Route Frequency Provider Last Rate Last Dose  . prochlorperazine (COMPAZINE) injection 10 mg  10 mg Intravenous Once Rexene Agent        Allergies as of 05/03/2018 - Review Complete 05/03/2018  Allergen Reaction Noted  . Nsaids Other (See Comments) 09/07/2016  . Penicillins  10/17/2017    Family History  Problem Relation Age of Onset  . Hypertension Mother        deceased 59  . Heart attack Father        deceased late 31s    Social History   Socioeconomic History  . Marital status: Single    Spouse name: Not on file  . Number  of children: Not on file  . Years of education: Not on file  .  Highest education level: Not on file  Occupational History  . Not on file  Social Needs  . Financial resource strain: Not on file  . Food insecurity:    Worry: Not on file    Inability: Not on file  . Transportation needs:    Medical: No    Non-medical: No  Tobacco Use  . Smoking status: Never Smoker  . Smokeless tobacco: Never Used  Substance and Sexual Activity  . Alcohol use: Not Currently  . Drug use: Never  . Sexual activity: Not Currently  Lifestyle  . Physical activity:    Days per week: Not on file    Minutes per session: Not on file  . Stress: Not on file  Relationships  . Social connections:    Talks on phone: Not on file    Gets together: Not on file    Attends religious service: Not on file    Active member of club or organization: Not on file    Attends meetings of clubs or organizations: Not on file    Relationship status: Not on file  . Intimate partner violence:    Fear of current or ex partner: Not on file    Emotionally abused: Not on file    Physically abused: Not on file    Forced sexual activity: Not on file  Other Topics Concern  . Not on file  Social History Narrative  . Not on file    Review of Systems: Gen: 20lb wt loss, no sweats, chills or fever CV: denies chest pain or palpitations  Resp: denies cough or shortness of breath  GI: unable to tolerate po intake, no dysphagia, no abdominal pain, see HPI GU : no dysuria or hematuria  MS: no current back pain, no muscle aches or joint pain Derm: no rash or skin lesions  Psych: no anxiety or depression Heme: Neuro:  Denies any headaches, dizziness, paresthesias. Endo:  Denies any problems with DM, thyroid, adrenal function.  Physical Exam: Vital signs in last 24 hours: Temp:  [98.5 F (36.9 C)-99.3 F (37.4 C)] 99.3 F (37.4 C) (03/23 2059) Pulse Rate:  [92-113] 92 (03/23 2100) Resp:  [17-20] 18 (03/23 2100) BP:  (125-128)/(66-71) 125/71 (03/23 2059) SpO2:  [97 %-98 %] 98 % (03/23 2059) Last BM Date: 05/14/18 General:  Alert, fatigued appearing female  Head:  Normocephalic and atraumatic. Eyes:  Sclera clear, no icterus. PERRLA. Conjunctiva pink. Ears:  Normal auditory acuity. Nose:  No deformity, discharge,  or lesions. Mouth:  No deformity or lesions.   Neck:  Supple; no masses or thyromegaly. Lungs: Clear throughout. Central line right subclavian.  Heart:  Tachycardic, ? rub with inspiration R sternal border 2nd ICS Abdomen:  Soft, epigastric mass palpated, tender to touch,  J tube site intact, tube feedings infusing, laparoscopic incision above the umbi Rectal:  Deferred  Msk:  Symmetrical without gross deformities. . Pulses:  Normal pulses noted. Extremities:  Without clubbing or edema. Neurologic:  Alert and  oriented x4;  grossly normal neurologically. Skin:  Intact without significant lesions or rashes.. Psych:  Alert and cooperative. Normal mood and affect.  Intake/Output from previous day: 03/23 0701 - 03/24 0700 In: 936.5 [P.O.:660; NG/GT:276.5] Out: 1350 [Urine:250; Emesis/NG output:1100] Intake/Output this shift: No intake/output data recorded.  Lab Results: Recent Labs    05/12/18 0412  WBC 10.5  HGB 8.4*  HCT 27.8*  PLT 260   BMET Recent Labs    05/12/18 0412 05/13/18 0542  NA 138 136  K 3.7 4.3  CL 105 101  CO2 25 25  GLUCOSE 116* 138*  BUN 9 13  CREATININE 0.94 1.10*  CALCIUM 8.3* 8.2*   LFT Recent Labs    05/12/18 0412  PROT 6.8  ALBUMIN 2.4*  AST 18  ALT 14  ALKPHOS 82  BILITOT 0.5    IMPRESSION/PLAN:    [age] y.o. female with pancreatic adenocarcinoma stage Ib with metastasis to the liver, pancreatic pseudocysts. A/P CT showed a large pseudocyst along the pancreatic body/tail measuring 7.2 x 12.7 cm, previously 5.0 x 9.8 cm. Additional 4.9 x 6.0 cm complex pseudocyst along the pancreatic tail/splenic hilum, previously 3.5 x 2.9 cm. No  evidence of infectious necrotizing pancreatitis. She is afebrile. WBC 10.5 -cyst gastrostomy with Dr. Rush Landmark 05/14/2018 -monitor WBC and temperature   2. Nausea and vomiting secondary to # 1 - Ondansetron 54m IV Q 6hrs PRN  3. Malnourished on Osmolite TF via J Tube. Albumin 2.4. -continue Osmolite  TF, appreciate RD recommendations  4. History of LLE DVT 12/2027 off Eliquis. Lovenox dc'd. Patient requires Heparin IV pre procedure.  -Heparin IV gtt ordered per Dr. BBarry Dienes -Hold Heparin 6 hrs before cyst gastrostomy 05/15/2018  5. Macrocytic Anemia ? Chemo. Hg 8.4. HCT 27.8. MCV 102.6. Base line Hg 9.0 2 weeks ago. -check Vitamin B12 level if not already done -monitor for GI bleeding   Further recommendations per Dr. GClaudina LickMDorathy Daft 05/14/2018, 12:29 PM   Attending physician's note   I have taken an interval history, reviewed the chart and examined the patient. I agree with the Advanced Practitioner's note, impression and recommendations.   68year old AAF with metastatic uncinate pancreatic adenocarcinoma, had severe necrotizing pancreatitis after fiducial marker placement, now with symptomatic large pancreatic pseudocyst.  Had laparoscopic J-tube placed last week, for endoscopic cyst-gastrostomy tomorrow with Dr. MRush Landmark  History of DVT (Eliquis on hold), currently on IV heparin.  To be stopped at 3 AM for 9 AM procedure.  Tube feeds to be stopped at midnight.   RCarmell Austria MD

## 2018-05-14 NOTE — Progress Notes (Signed)
  Progress Note: General Surgery Service   Assessment/Plan: Active Problems:   Pancreatic cancer metastasized to liver [facility])  s/p Procedure(s): LAPAROSCOPY DIAGNOSTIC AND J TUBE PLACEMENT (FEEDING TUBE) 05/09/2018  Pancreatic cancer stage IV, liver mets.- Follow-up with Dr. Grayland Ormond to discuss next steps  Persistent nausea/vomiting- not related to tube feeds, continue multimodal medications, replace electrolytes (stable today).  Likely related to enlarging pancreatic pseudocyst.  Was going to see GI as outpatient, asking them to see as inpatient.   Severe protein cal malnutrition- increase tube feeds to to goal.    PT consult for severe deconditioning. Case management consult to get [facility] set up.  Will need HH J-tube feeds and probably HH PT Hypertension-Home meds   LOS: 5 days  Chief Complaint/Subjective: Remains very nauseated.  Tolerating tube feeds with flatus and BM.    Objective: Vital signs in last 24 hours: Temp:  [98.5 F (36.9 C)-99.3 F (37.4 C)] 99.3 F (37.4 C) (03/23 2059) Pulse Rate:  [92-113] 92 (03/23 2100) Resp:  [17-20] 18 (03/23 2100) BP: (125-128)/(66-71) 125/71 (03/23 2059) SpO2:  [97 %-98 %] 98 % (03/23 2059) Last BM Date: 05/14/18  Intake/Output from previous day: 03/23 0701 - 03/24 0700 In: 936.5 [P.O.:660; NG/GT:276.5] Out: 1350 [Urine:250; Emesis/NG output:1100] Intake/Output this shift: No intake/output data recorded.  Lungs: Breathing comfortably Abd: soft, NT, epigastric distention, but not lower abdominal distention, tube in position and functional Extremities: no edema Neuro: AOx4  Lab Results: CBC  Recent Labs    05/12/18 0412  WBC 10.5  HGB 8.4*  HCT 27.8*  PLT 260   BMET Recent Labs    05/12/18 0412 05/13/18 0542  NA 138 136  K 3.7 4.3  CL 105 101  CO2 25 25  GLUCOSE 116* 138*  BUN 9 13  CREATININE 0.94 1.10*  CALCIUM 8.3* 8.2*   PT/INR No results for input(s): LABPROT, INR in the last 72 hours. ABG No results  for input(s): PHART, HCO3 in the last 72 hours.  Invalid input(s): PCO2, PO2  Studies/Results:  Anti-infectives: Anti-infectives (From admission, onward)   Start     Dose/Rate Route Frequency Ordered Stop   05/09/18 2000  ciprofloxacin (CIPRO) IVPB 400 mg     400 mg 200 mL/hr over 60 Minutes Intravenous Every 12 hours 05/09/18 1136 05/09/18 2101   05/09/18 0600  ciprofloxacin (CIPRO) IVPB 400 mg     400 mg 200 mL/hr over 60 Minutes Intravenous On call to O.R. 05/09/18 0546 05/09/18 0810      Medications: Scheduled Meds: . enoxaparin (LOVENOX) injection  50 mg Subcutaneous Q12H  . feeding supplement (OSMOLITE 1.2 CAL)  1,000 mL Per Tube Q24H  . fentaNYL  1 patch Transdermal Q72H  . folic acid  1 mg Oral Daily  . gabapentin  300 mg Oral BID  . insulin aspart  0-9 Units Subcutaneous Q4H  . lipase/protease/amylase  36,000-72,000 Units Oral TID WC  . losartan  50 mg Oral Daily  . potassium chloride  20 mEq Oral Daily   Continuous Infusions:  PRN Meds:.acetaminophen **OR** acetaminophen, diphenhydrAMINE **OR** diphenhydrAMINE, hydrALAZINE, HYDROmorphone (DILAUDID) injection, lidocaine-prilocaine, loperamide, methocarbamol, ondansetron **OR** ondansetron (ZOFRAN) IV, oxyCODONE, prochlorperazine, promethazine, promethazine, sodium chloride flush  Stark Klein, MD [facility]

## 2018-05-14 NOTE — Progress Notes (Signed)
ANTICOAGULATION CONSULT NOTE - Initial Consult  Pharmacy Consult for IV heparin Indication: history of LLE DVT  Allergies  Allergen Reactions  . Nsaids Other (See Comments)    Decreased GFR  . Penicillins     Yeast infection Did it involve swelling of the face/tongue/throat, SOB, or low BP? No Did it involve sudden or severe rash/hives, skin peeling, or any reaction on the inside of your mouth or nose? No Did you need to seek medical attention at a hospital or doctor's office? No When did it last happen?Unknown If all above answers are "NO", may proceed with cephalosporin use.      Patient Measurements: Height: _0  (160 cm) Weight: 123 lb 14.4 oz (56.2 kg) IBW/kg (Calculated) : 52.4 Heparin Dosing Weight: actual body weight   Vital Signs: Temp: 97.8 F (36.6 C) (03/24 1435) Temp Source: Oral (03/24 1435) BP: 126/77 (03/24 1435) Pulse Rate: 118 (03/24 1435)  Labs: Recent Labs    05/12/18 0412 05/13/18 0542  HGB 8.4*  --   HCT 27.8*  --   PLT 260  --   CREATININE 0.94 1.10*    Estimated Creatinine Clearance: 39.9 mL/min (A) (by C-G formula based on SCr of 1.1 mg/dL (H)).   Medical History: Past Medical History:  Diagnosis Date  . Anemia   . Complication of anesthesia   . Difficult airway for intubation    2018 Oophorectomy, CRNA unable to visualized vocal cords with MAC 3 bladed. Easy ventilations O2 sats 99%. Rolls placed under the shoulders and pillows under the head Repeat Laryngoscopy with MAC 3 blade > Visualization not much better. O2 sats 99%. Intubation with Glidescope on the Second Attempt. BBSE good ETCO 2 O 2 sats 99%.   . Essential hypertension   . Pancreatic cancer ([place])   . PONV (postoperative nausea and vomiting)   . UTI (urinary tract infection)     Assessment: 89 y/oF with PMH of pancreatic adenocarcinoma with metastasis to liver, LLE DVT admitted to [facility] on 05/09/2018 due to FTT, s/p laparoscopic J-tube placement. Abdominal/pelvic CT  on 05/10/2018 showed enlarged pancreatic pseudocysts. Plan for endoscopic cyst gastrostomy. PTA Apixaban held since 05/06/2018. Prophylactic dose enoxaparin 54m SQ q24h given 05/10/2018-05/13/2018, changed to full dose enoxaparin 561mSQ q12h this morning (last dose given at 0913). Last CBC on 05/12/2018 shows Hgb low, but relatively stable at 8.4, Pltc WNL/stable. SCr (05/13/2018) = 1.1 with CrCl ~ 40 ml/min. Patient transitioned to IV heparin infusion in anticipation of procedure, and pharmacy asked to assist with dosing.   Goal of Therapy:  Heparin level 0.3-0.7 units/ml Monitor platelets by anticoagulation protocol: Yes   Plan:  Check CBC, aPTT, PT/INR prior to initiation of IV heparin  At 2100 tonight (~ 12 hours after last therapeutic enoxaparin dose), start IV heparin infusion at 900 units/hr  Stop IV heparin on 05/15/2018 at 0300 for 0900 procedure per GI orders  F/u post-procedure on when to resume anticoagulation  Monitor closely for s/sx of bleeding   JiLindell SparPharmD, BCPS Pager: [phone]/24/2020 3:46 PM

## 2018-05-14 NOTE — Progress Notes (Signed)
Discussed patient with son.  Have also discussed with GI.  Plan is to work on decompressing large pancreatic pseudocyst with endoscopic cyst gastrostomy to see if we can get her to stop having such extreme nausea.  The cyst has enlarged dramatically.  Will convert to IV heparin gtt as she may have to go back multiple days in a row.

## 2018-05-14 NOTE — Progress Notes (Signed)
Nutrition Follow-up  DOCUMENTATION CODES:   Non-severe (moderate) malnutrition in context of chronic illness  INTERVENTION:   Monitor magnesium, potassium, and phosphorus daily for at least 3 days, MD to replete as needed, as pt is at risk for refeeding syndrome given malnutrition status and current N/V.  -Advance Osmolite 1.2 to 45 ml/hr via J-tube, advance to goal rate of 50 ml/hr after 4 hours. -At goal will provide 1440 kcal, 67 grams of protein, and 984 ml free water (~75% of estimated needs)   NUTRITION DIAGNOSIS:   Moderate Malnutrition related to chronic illness, cancer and cancer related treatments as evidenced by percent weight loss, moderate fat depletion, moderate muscle depletion.  Ongoing.  GOAL:   Patient will meet greater than or equal to 90% of their needs  Progressing.  MONITOR:   PO intake, Labs, Weight trends, TF tolerance, I & O's  ASSESSMENT:   Patient with PMH significant for pancreatic cancer (diagnosed 10/2017) on chemotherapy, renal mass, HTN, severe malnutrition, CKD III, and ventral hernia without obstruction. 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   **RD working remotely**  Per Management consultant with RN, pt is currently receiving Osmolite 1.2 @ 35 ml/hr. Advised RN to advance to 45 ml/hr and then to goal of 50 ml/hr after 4 hours. Pt continues to have some nausea and was unable to take K-DUR today. No labs yet today. As of 3/23, K levels were WNL.  Emesis is yellow/clear in color per surgery note. N/V not associated with tube feeds.   Weights remain stable as of 3/22.  Medications: Folic acid tablet daily, Creon capsule TID, K-DUR tablet, IV Zofran PRN, IV Compazine PRN, IV Phenergan PRN  Labs reviewed: CBGs: 125-132 Elevated Mg/Phos - will continue to monitor GFR: 59  Diet Order:   Diet Order            Diet NPO time specified Except for: Sips with Meds  Diet effective midnight              EDUCATION NEEDS:    Not appropriate for education at this time  Skin:  Skin Assessment: Skin Integrity Issues: Skin Integrity Issues:: Incisions Incisions: closed abdomen  Last BM:  3/24  Height:   Ht Readings from Last 1 Encounters:  05/09/18 5\' 3"  (1.6 m)    Weight:   Wt Readings from Last 1 Encounters:  05/12/18 56.2 kg    Ideal Body Weight:  52.3 kg  BMI:  Body mass index is 21.95 kg/m.  Estimated Nutritional Needs:   Kcal:  1650-1850 kcal  Protein:  80-100 grams  Fluid:  >/= 1.6 L/day  Clayton Bibles, MS, RD, LDN [place] Dietitian Pager: [phone] After Hours Pager: [phone]

## 2018-05-14 NOTE — Plan of Care (Signed)
Plan of care reviewed and discussed with the patient. 

## 2018-05-15 DIAGNOSIS — K910 Vomiting following gastrointestinal surgery: Secondary | ICD-10-CM

## 2018-05-15 DIAGNOSIS — K862 Cyst of pancreas: Secondary | ICD-10-CM

## 2018-05-15 DIAGNOSIS — N179 Acute kidney failure, unspecified: Secondary | ICD-10-CM

## 2018-05-15 DIAGNOSIS — R935 Abnormal findings on diagnostic imaging of other abdominal regions, including retroperitoneum: Secondary | ICD-10-CM

## 2018-05-15 LAB — BASIC METABOLIC PANEL
Anion gap: 10 (ref 5–15)
Anion gap: 9 (ref 5–15)
BUN: 37 mg/dL — ABNORMAL HIGH (ref 8–23)
BUN: 41 mg/dL — ABNORMAL HIGH (ref 8–23)
CO2: 26 mmol/L (ref 22–32)
CO2: 23 mmol/L (ref 22–32)
CREATININE: 2.77 mg/dL — AB (ref 0.44–1.00)
Calcium: 8.3 mg/dL — ABNORMAL LOW (ref 8.9–10.3)
Calcium: 7.7 mg/dL — ABNORMAL LOW (ref 8.9–10.3)
Chloride: 100 mmol/L (ref 98–111)
Chloride: 105 mmol/L (ref 98–111)
Creatinine, Ser: 3.35 mg/dL — ABNORMAL HIGH (ref 0.44–1.00)
GFR calc Af Amer: 19 mL/min — ABNORMAL LOW (ref >60)
GFR calc Af Amer: 15 mL/min — ABNORMAL LOW (ref >60)
GFR calc non Af Amer: 17 mL/min — ABNORMAL LOW (ref >60)
GFR calc non Af Amer: 13 mL/min — ABNORMAL LOW (ref >60)
Glucose, Bld: 115 mg/dL — ABNORMAL HIGH (ref 70–99)
Glucose, Bld: 127 mg/dL — ABNORMAL HIGH (ref 70–99)
Potassium: 4.6 mmol/L (ref 3.5–5.1)
Potassium: 4.6 mmol/L (ref 3.5–5.1)
Sodium: 136 mmol/L (ref 135–145)
Sodium: 137 mmol/L (ref 135–145)

## 2018-05-15 LAB — GLUCOSE, CAPILLARY
GLUCOSE-CAPILLARY: 111 mg/dL — AB (ref 70–99)
Glucose-Capillary: 102 mg/dL — ABNORMAL HIGH (ref 70–99)
Glucose-Capillary: 122 mg/dL — ABNORMAL HIGH (ref 70–99)
Glucose-Capillary: 125 mg/dL — ABNORMAL HIGH (ref 70–99)
Glucose-Capillary: 81 mg/dL (ref 70–99)
Glucose-Capillary: 97 mg/dL (ref 70–99)

## 2018-05-15 LAB — IRON AND TIBC
Iron: 35 ug/dL (ref 28–170)
Saturation Ratios: 28 % (ref 10.4–31.8)
TIBC: 123 ug/dL — ABNORMAL LOW (ref 250–450)
UIBC: 88 ug/dL

## 2018-05-15 LAB — CBC
HCT: 31.5 % — ABNORMAL LOW (ref 36.0–46.0)
Hemoglobin: 9.6 g/dL — ABNORMAL LOW (ref 12.0–15.0)
MCH: 31.4 pg (ref 26.0–34.0)
MCHC: 30.5 g/dL (ref 30.0–36.0)
MCV: 102.9 fL — ABNORMAL HIGH (ref 80.0–100.0)
PLATELETS: 333 10*3/uL (ref 150–400)
RBC: 3.06 MIL/uL — ABNORMAL LOW (ref 3.87–5.11)
RDW: 16.5 % — ABNORMAL HIGH (ref 11.5–15.5)
WBC: 9.7 10*3/uL (ref 4.0–10.5)
nRBC: 0 % (ref 0.0–0.2)

## 2018-05-15 LAB — FERRITIN: FERRITIN: 279 ng/mL (ref 11–307)

## 2018-05-15 LAB — VITAMIN B12: Vitamin B-12: 304 pg/mL (ref 180–914)

## 2018-05-15 LAB — MAGNESIUM: Magnesium: 2.8 mg/dL — ABNORMAL HIGH (ref 1.7–2.4)

## 2018-05-15 LAB — PHOSPHORUS: Phosphorus: 5.4 mg/dL — ABNORMAL HIGH (ref 2.5–4.6)

## 2018-05-15 LAB — HEPARIN LEVEL (UNFRACTIONATED): Heparin Unfractionated: 0.32 IU/mL (ref 0.30–0.70)

## 2018-05-15 MED ORDER — OSMOLITE 1.2 CAL PO LIQD
1000.0000 mL | ORAL | Status: AC
  Administered 2018-05-15: 1000 mL
  Filled 2018-05-15: qty 1000

## 2018-05-15 MED ORDER — HEPARIN (PORCINE) 25000 UT/250ML-% IV SOLN
950.0000 [IU]/h | INTRAVENOUS | Status: AC
  Administered 2018-05-15: 950 [IU]/h via INTRAVENOUS
  Filled 2018-05-15: qty 250

## 2018-05-15 MED ORDER — ONDANSETRON HCL 4 MG/2ML IJ SOLN
4.0000 mg | Freq: Four times a day (QID) | INTRAMUSCULAR | Status: DC
  Administered 2018-05-15 – 2018-05-19 (×16): 4 mg via INTRAVENOUS
  Filled 2018-05-15 (×15): qty 2

## 2018-05-15 MED ORDER — SODIUM CHLORIDE 0.9 % IV SOLN
INTRAVENOUS | Status: DC
  Administered 2018-05-16 – 2018-05-19 (×3): via INTRAVENOUS

## 2018-05-15 MED ORDER — HEPARIN (PORCINE) 25000 UT/250ML-% IV SOLN: 950.0000 [IU]/h | INTRAVENOUS | Status: DC

## 2018-05-15 MED ORDER — HEPARIN (PORCINE) 25000 UT/250ML-% IV SOLN
950.0000 [IU]/h | INTRAVENOUS | Status: DC
  Filled 2018-05-15: qty 250

## 2018-05-15 MED ORDER — SODIUM CHLORIDE 0.9 % IV SOLN: INTRAVENOUS | Status: DC

## 2018-05-15 MED ORDER — SODIUM CHLORIDE 0.9 % IV SOLN
INTRAVENOUS | Status: DC
  Administered 2018-05-15 – 2018-05-16 (×3): via INTRAVENOUS

## 2018-05-15 MED ORDER — PROPOFOL 10 MG/ML IV BOLUS
INTRAVENOUS | Status: AC
  Filled 2018-05-15: qty 40

## 2018-05-15 MED ORDER — HEPARIN (PORCINE) 25000 UT/250ML-% IV SOLN
900.0000 [IU]/h | INTRAVENOUS | Status: DC
  Administered 2018-05-15: 900 [IU]/h via INTRAVENOUS
  Filled 2018-05-15: qty 250

## 2018-05-15 MED ORDER — SODIUM CHLORIDE 0.9 % IV BOLUS
500.0000 mL | Freq: Once | INTRAVENOUS | Status: AC
  Administered 2018-05-15: 500 mL via INTRAVENOUS

## 2018-05-15 MED ORDER — SODIUM CHLORIDE 0.9 % IV BOLUS
1000.0000 mL | Freq: Once | INTRAVENOUS | Status: AC
  Administered 2018-05-15: 1000 mL via INTRAVENOUS

## 2018-05-15 NOTE — Progress Notes (Signed)
Progress Note: General Surgery Service   Assessment/Plan: Active Problems:   Pancreatic cancer metastasized to liver [facility])  s/p Procedure(s): LAPAROSCOPY DIAGNOSTIC AND J TUBE PLACEMENT (FEEDING TUBE) 05/09/2018  Pancreatic cancer stage IV, liver mets.- Follow-up with Dr. Grayland Ormond to discuss next steps  Persistent nausea/vomiting- not related to tube feeds, continue multimodal medications, replace electrolytes (stable today).  Likely related to enlarging pancreatic pseudocyst. GI planning endoscopic cyst gastrostomy, was going to be today, but pt Cr increased.  Tentatively planned tomorrow.   Severe protein cal malnutrition- increase goal as originally pt was getting 75% calories via tube feeds and now has 0 calories from tube feeds.   Dehydration - fluid bolus, add maintenance IV fluids, and increase free water flushes.     PT consult for severe deconditioning. Case management consult to get [facility] set up.  Will need HH J-tube feeds and probably HH PT Hypertension-Home meds   LOS: 6 days  Chief Complaint/Subjective: Little change clinically.  Still throwing up.    Objective: Vital signs in last 24 hours: Temp:  [97.8 F (36.6 C)-98.9 F (37.2 C)] 98.5 F (36.9 C) (03/25 0747) Pulse Rate:  [65-122] 116 (03/25 0747) Resp:  [15-18] 17 (03/25 0747) BP: (97-126)/(51-77) 101/51 (03/25 0747) SpO2:  [94 %-100 %] 100 % (03/25 0747) Last BM Date: 05/15/18  Intake/Output from previous day: 03/24 0701 - 03/25 0700 In: 1118.8 [P.O.:50; I.V.:53.5; NG/GT:815.3] Out: -  Intake/Output this shift: No intake/output data recorded.  Lungs: Breathing comfortably Abd: soft, NT, epigastric distention, but not lower abdominal distention, tube in position and functional Extremities: no edema Neuro: AOx4  Lab Results: CBC  Recent Labs    05/14/18 2015 05/15/18 0316  WBC 8.5 9.7  HGB 9.5* 9.6*  HCT 30.1* 31.5*  PLT 307 333   BMET Recent Labs    05/13/18 0542 05/15/18 0316  NA 136  136  K 4.3 4.6  CL 101 100  CO2 25 26  GLUCOSE 138* 115*  BUN 13 37*  CREATININE 1.10* 2.77*  CALCIUM 8.2* 8.3*   PT/INR Recent Labs    05/14/18 2015  LABPROT 14.3  INR 1.1   ABG No results for input(s): PHART, HCO3 in the last 72 hours.  Invalid input(s): PCO2, PO2  Studies/Results:  Anti-infectives: Anti-infectives (From admission, onward)   Start     Dose/Rate Route Frequency Ordered Stop   05/09/18 2000  ciprofloxacin (CIPRO) IVPB 400 mg     400 mg 200 mL/hr over 60 Minutes Intravenous Every 12 hours 05/09/18 1136 05/09/18 2101   05/09/18 0600  ciprofloxacin (CIPRO) IVPB 400 mg     400 mg 200 mL/hr over 60 Minutes Intravenous On call to O.R. 05/09/18 0546 05/09/18 0810      Medications: Scheduled Meds: . fentaNYL  1 patch Transdermal Q72H  . folic acid  1 mg Oral Daily  . gabapentin  300 mg Oral BID  . insulin aspart  0-9 Units Subcutaneous Q4H  . lipase/protease/amylase  36,000-72,000 Units Oral TID WC  . losartan  50 mg Oral Daily  . ondansetron (ZOFRAN) IV  4 mg Intravenous Q6H  . oxyCODONE  5 mg Per Tube Q8H  . potassium chloride  20 mEq Oral Daily   Continuous Infusions: . sodium chloride    . sodium chloride    . sodium chloride 100 mL/hr at 05/15/18 0924  . feeding supplement (OSMOLITE 1.2 CAL)    . heparin     PRN Meds:.acetaminophen **OR** acetaminophen, diphenhydrAMINE **OR** diphenhydrAMINE, hydrALAZINE, HYDROmorphone (DILAUDID)  injection, lidocaine-prilocaine, loperamide, methocarbamol, prochlorperazine, promethazine, promethazine, sodium chloride flush  Stark Klein, MD [facility]

## 2018-05-15 NOTE — Progress Notes (Signed)
Nutrition Follow-up  DOCUMENTATION CODES:   Non-severe (moderate) malnutrition in context of chronic illness  INTERVENTION:   Monitor magnesium, potassium, and phosphorus daily for at least 3 days, MD to replete as needed, as pt is at risk for refeeding syndrome givenmalnutrition status and current N/V.  3/25: Resume Osmolite 1.2 @ 50 ml/hr via J-tube. Will be turned off at midnight. At goal will provide1440 kcal, 67 grams of protein, and 984 ml free water (~75% of estimated needs)   NUTRITION DIAGNOSIS:   Moderate Malnutrition related to chronic illness, cancer and cancer related treatments as evidenced by percent weight loss, moderate fat depletion, moderate muscle depletion.  Ongoing.  GOAL:   Patient will meet greater than or equal to 90% of their needs  Progressing.  MONITOR:   PO intake, Labs, Weight trends, TF tolerance, I & O's  REASON FOR ASSESSMENT:   Consult Enteral/tube feeding initiation and management  ASSESSMENT:   Patient with PMH significant for pancreatic cancer (diagnosed 10/2017) on chemotherapy, renal mass, HTN, severe malnutrition, CKD III, and ventral hernia without obstruction. 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   **RD working remotely**  RD consulted to resume TF as cystic gastrostomy procedure was cancelled today. TF was turned off at midnight 3/24 in preparation of procedure today. TF has been resumed and will then turn off again tonight at midnight. Pt was tolerating TF yesterday at goal rate.   Pt did have bilious emesis overnight per chart review. Having BMs.  Medications: Folic acid tablet daily, Creon capsule TID, IV Zofran every 6 hours, IV Compazine PRN Labs reviewed: CBGs: 111-125 Elevated Mg/Phos -trending up GFR: 19  Diet Order:   Diet Order            Diet NPO time specified  Diet effective midnight        Diet NPO time specified  Diet effective midnight              EDUCATION NEEDS:    Not appropriate for education at this time  Skin:  Skin Assessment: Skin Integrity Issues: Skin Integrity Issues:: Incisions Incisions: closed abdomen  Last BM:  3/25  Height:   Ht Readings from Last 1 Encounters:  05/09/18 5\' 3"  (1.6 m)    Weight:   Wt Readings from Last 1 Encounters:  05/12/18 56.2 kg    Ideal Body Weight:  52.3 kg  BMI:  Body mass index is 21.95 kg/m.  Estimated Nutritional Needs:   Kcal:  1650-1850 kcal  Protein:  80-100 grams  Fluid:  >/= 1.6 L/day  Clayton Bibles, MS, RD, LDN [place] Dietitian Pager: [phone] After Hours Pager: [phone]

## 2018-05-15 NOTE — H&P (View-Only) (Signed)
Patient ID: Cindy Bowman, female   DOB: 01-12-1951, 68 y.o.   MRN: [id number]  Cr. 3.35 >> 2.77. BUN 41 >> 37. I contacted Dr. Barry Dienes, discussed AKI with worsening  Cr. 3.3.5.  NS IV 1,06ml x 1  Bolus now. Dr. Barry Dienes to facilitate renal consult.

## 2018-05-15 NOTE — Progress Notes (Signed)
[facility] Progress Note  CC:   Pancreatic adenocarcinoma stage Ib with metastasis to the liver,  pancreatic pseudocyst for possible cyst gastrostomy  Subjective: Patient reports feeling awful. Vomited bilious emesis continuously during the night. Had 2 loose stools during the night. Some epigastric pain, not severe. Heparin gtt was dc'd at 3am for cyst gastrostomy.   Objective:  Creatinine 2.77 up from 1.10. BUN 37. Temp 98.5. BP 101/51. HR 116. Patient appears dehydrated. IV NS 1000cc started. Zofran 4mg  IV given.   Vital signs in last 24 hours: Temp:  [97.8 F (36.6 C)-98.9 F (37.2 C)] 98.5 F (36.9 C) (03/25 0747) Pulse Rate:  [65-122] 116 (03/25 0747) Resp:  [15-18] 17 (03/25 0747) BP: (97-126)/(51-77) 101/51 (03/25 0747) SpO2:  [94 %-100 %] 100 % (03/25 0747) Last BM Date: 05/14/18 General:   Alert, fatigued appearing, weak, actively vomiting.  Heart: tachycardic, no murmur Pulm: Lungs Clear.  Abdomen: soft, epigastric tenderness without rebound or guarding, epigastric mass protrudes, no bruit, + BS x 4 quads, incision above umbilicus intact without exudate or erythema, J tube intact, clamped.  Extremities:  Without edema. Neurologic:  Alert and  oriented x4;  grossly normal neurologically. Psych:  Alert and cooperative. Normal mood and affect.  Intake/Output from previous day: 03/24 0701 - 03/25 0700 In: 1118.8 [P.O.:50; I.V.:53.5; NG/GT:815.3] Out: -  Intake/Output this shift: No intake/output data recorded.  Lab Results: Recent Labs    05/14/18 2015 05/15/18 0316  WBC 8.5 9.7  HGB 9.5* 9.6*  HCT 30.1* 31.5*  PLT 307 333   BMET Recent Labs    05/13/18 0542 05/15/18 0316  NA 136 136  K 4.3 4.6  CL 101 100  CO2 25 26  GLUCOSE 138* 115*  BUN 13 37*  CREATININE 1.10* 2.77*  CALCIUM 8.2* 8.3*   LFT No results for input(s): PROT, ALBUMIN, AST, ALT, ALKPHOS, BILITOT, BILIDIR, IBILI in the last 72 hours. PT/INR Recent Labs   05/14/18 2015  LABPROT 14.3  INR 1.1    Assessment / Plan: 1. 68 y.o. female with pancreatic adenocarcinoma stage Ib with metastasis to the liver, pancreatic pseudocysts. A/P CT showed a large pseudocyst along the pancreatic body/tail measuring 7.2 x 12.7 cm, previously 5.0 x 9.8 cm. Additional 4.9 x 6.0 cm complex pseudocyst along the pancreatic tail/splenic hilum, previously 3.5 x 2.9 cm. No evidence of infectious necrotizing pancreatitis. Afebrile. WBC 9.7 -Cyst gastrostomy was scheduled today at 9am currently on hold due to AKI in setting of dehydration, anesthesia to review case, procedure will most likely be deferred to later today or tomorrow -Continue to monitor BP, HR, Temp   2. Acute Kidney Injury secondary to dehydration in setting of N/V/D. Patient has been NPO after MN for planned cyst gastrostomy.  Cr 2.77 >> 1.10. BUN 37. -IV NS 1000cc stat, then NS 100cc/hr, further IVF management deferred to surgery -Zofran 4mg  Q 6 hours, stat dose now  3. Nausea and vomiting secondary to # 1, plan for cyst gastrostomy, hopefully N/V will improve after large pseudocysts drained.  Na 136. K 4.6. Cl 100. CO2 26. - Ondansetron 4mg  IV Q 6hrs   4. Malnourished on Osmolite TF via J Tube. Albumin 2.4. -TF currently on hold since MN  5. History of LLE DVT 12/2027 off Eliquis. Lovenox dc'd. Patient requires Heparin IV pre procedure. Heparin gtt dc'd at 3am.  -await further heparin instructions after decision made regarding to proceed with cyst gastrostomy today or if deferred until tomorrow  6. Macrocytic Anemia ? Chemo. Hg 9.6 >>8.4. HCT 31.5 >>27.8. MCV 102.6. Base line Hg 9.0 2 weeks ago. Vitamin B12 304. Iron 35. TIBC 123. -recommend Vitamin B 12 1063mcg SQ injection -follow CBC  Noralyn Pick  05/15/2018, 7:49 AM

## 2018-05-15 NOTE — Progress Notes (Signed)
Brief Pharmacy Note:  76 y/oF on IV heparin for history of LLE DVT (transitioned from enoxaparin; was on Apixaban PTA). Plan was for EUS cyst gastrostomy today, but procedure cancelled due to AKI in setting of dehydration. Current plan is for procedure to be done tomorrow per GI.     1802 heparin level = 0.32 units/mL, on low end of therapeutic range  CBC: Hgb 9.6, low but stable. Pltc WNL  No bleeding or infusion issues reported per nursing   Plan:  Increase IV heparin infusion rate slightly to 950 units/hr, as level is at low end of therapeutic range  Check heparin level 8 hours after rate change  Stop heparin infusion on 3/26 at 0545 for procedure at 1145 per GI orders  Monitor closely for s/sx of bleeding   Lindell Spar, PharmD, BCPS Pager: [phone] 05/15/2018 7:28 PM

## 2018-05-15 NOTE — TOC Initial Note (Signed)
Transition of Care [facility]) - Initial/Assessment Note    Patient Details  Name: Cindy Bowman MRN: [id number] Date of Birth: 1950/12/16  Transition of [facility]) CM/SW Contact:    Leeroy Cha, RN Phone Number: 05/15/2018, 11:51 AM  Clinical Narrative:                 Discharge Readiness Return to top of Hernia Repair (Non-Hiatal) RRG - [place]  Discharge readiness is indicated by patient meeting Recovery Milestones, including ALL of the following: ? Hemodynamic stability-yes ? No evidence of ileus or bowel obstruction no ? No evidence of surgical site infection NO ? Pain absent or managed  MANAGED ? Ambulatory  YES ? Oral hydration, medications, and diet ? NPO, IV FLDS ? NOT REAdy for dc   Expected Discharge Plan: [place] Barriers to Discharge: No Barriers Identified   Patient Goals and CMS Choice Patient states their goals for this hospitalization and ongoing recovery are:: to get better CMS Medicare.gov Compare Post Acute Care list provided to:: Patient Choice offered to / list presented to : Patient, Spouse  Expected Discharge Plan and Services Expected Discharge Plan: [place]   Discharge Planning Services: CM Consult Post Acute Care Choice: [place] arrangements for the past 2 months: Single Family Home                 DME Arranged: 3-N-1, Cane(quad cane) DME Agency: [facility] HH Arranged: PT, OT, Nurse's Aide [place] Agency: [place] ([facility])  Prior Living Arrangements/Services Living arrangements for the past 2 months: Single Family Home Lives with:: Spouse Patient language and need for interpreter reviewed:: Yes Do you feel safe going back to the place where you live?: Yes      Need for Family Participation in Patient Care: Yes (Comment) Care giver support system in place?: Yes (comment)   Criminal Activity/Legal Involvement Pertinent to Current Situation/Hospitalization: No - Comment as  needed  Activities of Daily Living Home Assistive Devices/Equipment: [place] Rile (specify type) ADL Screening (condition at time of admission) Patient's cognitive ability adequate to safely complete daily activities?: Yes Is the patient deaf or have difficulty hearing?: No Does the patient have difficulty seeing, even when wearing glasses/contacts?: No Does the patient have difficulty concentrating, remembering, or making decisions?: No Patient able to express need for assistance with ADLs?: Yes Does the patient have difficulty dressing or bathing?: No Independently performs ADLs?: Yes (appropriate for developmental age) Does the patient have difficulty walking or climbing stairs?: Yes Weakness of Legs: Both Weakness of Arms/Hands: Both  Permission Sought/Granted Permission sought to share information with : Facility Art therapist granted to share information with : Yes, Release of Information Signed     Permission granted to share info w AGENCY: [facility], [facility]        Emotional Assessment Appearance:: Appears stated age Attitude/Demeanor/Rapport: Apprehensive Affect (typically observed): Anxious Orientation: : Oriented to Self, Oriented to Place, Oriented to  Time, Oriented to Situation Alcohol / Substance Use: Not Applicable Psych Involvement: No (comment)  Admission diagnosis:  PANCREATIC CANCER SEVERE PROTEIN CALORIE MALNUTRITION Patient Active Problem List   Diagnosis Date Noted  . Pancreatic cancer metastasized to liver ([place]) 05/09/2018  . Malnutrition of moderate degree 04/08/2018  . Pain, cancer 04/05/2018  . Pancreatic pseudocyst   . Palliative care encounter   . Ventral hernia without obstruction or gangrene 03/21/2018  . Acute deep vein thrombosis (DVT) of femoral vein of left lower extremity ([place])  03/11/2018  . Acute kidney injury ([place]) 03/11/2018  . Protein-calorie malnutrition, severe 03/10/2018  . Acute pancreatitis 03/08/2018  .  Chemotherapy induced neutropenia ([place]) 12/12/2017  . 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 Nausea & vomiting 11/16/2017  . Malignant neoplasm of head of pancreas ([place]) 10/18/2017  . Spinal stenosis 09/19/2017  . CKD (chronic kidney disease) stage 3, GFR 30-59 ml/min (HCC) 07/24/2017  . Gallstones 06/19/2017  . 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 Essential hypertension 12/14/2015  . Renal cyst, left 12/14/2015   PCP:  Glendon Axe, MD Pharmacy:   [facility] [place], [place] [address] [place] [address] Phone: [phone] Fax: [phone]  [id number] [place] [place], [place] - [place] [address] Phone: [phone] Fax: [phone]     Social Determinants of Health (SDOH) Interventions    Readmission Risk Interventions No flowsheet data found.

## 2018-05-15 NOTE — Progress Notes (Signed)
Patient ID: Cindy Bowman, female   DOB: [birth year], 68 y.o.   MRN: [id number]  Cr. 3.35 >> 2.77. BUN 41 >> 37. I contacted Dr. Barry Dienes, discussed AKI with worsening  Cr. 3.3.5.  NS IV 1,069ml x 1  Bolus now. Dr. Barry Dienes to facilitate renal consult.

## 2018-05-15 NOTE — Progress Notes (Signed)
ANTICOAGULATION CONSULT NOTE - Initial Consult  Pharmacy Consult for IV heparin Indication: history of LLE DVT  Allergies  Allergen Reactions  . Nsaids Other (See Comments)    Decreased GFR  . Penicillins     Yeast infection Did it involve swelling of the face/tongue/throat, SOB, or low BP? No Did it involve sudden or severe rash/hives, skin peeling, or any reaction on the inside of your mouth or nose? No Did you need to seek medical attention at a hospital or doctor's office? No When did it last happen?Unknown If all above answers are "NO", may proceed with cephalosporin use.     Patient Measurements: Height: 5' 3"  (160 cm) Weight: 123 lb 14.4 oz (56.2 kg) IBW/kg (Calculated) : 52.4 Heparin Dosing Weight: actual body weight   Vital Signs: Temp: 98.5 F (36.9 C) (03/25 0747) Temp Source: Oral (03/25 0747) BP: 101/51 (03/25 0747) Pulse Rate: 116 (03/25 0747)  Labs: Recent Labs    05/13/18 0542 05/14/18 2015 05/15/18 0316  HGB  --  9.5* 9.6*  HCT  --  30.1* 31.5*  PLT  --  307 333  APTT  --  40*  --   LABPROT  --  14.3  --   INR  --  1.1  --   CREATININE 1.10*  --  2.77*   Estimated Creatinine Clearance: 15.9 mL/min (A) (by C-G formula based on SCr of 2.77 mg/dL (H)).  Medical History: Past Medical History:  Diagnosis Date  . Anemia   . Complication of anesthesia   . Difficult airway for intubation    2018 Oophorectomy, CRNA unable to visualized vocal cords with MAC 3 bladed. Easy ventilations O2 sats 99%. Rolls placed under the shoulders and pillows under the head Repeat Laryngoscopy with MAC 3 blade > Visualization not much better. O2 sats 99%. Intubation with Glidescope on the Second Attempt. BBSE good ETCO 2 O 2 sats 99%.   . Essential hypertension   . Pancreatic cancer ([place])   . PONV (postoperative nausea and vomiting)   . UTI (urinary tract infection)    Assessment: 76 y/oF with PMH of pancreatic adenocarcinoma with metastasis to liver, LLE DVT  admitted to [facility] on 05/09/2018 due to FTT, s/p laparoscopic J-tube placement. Abdominal/pelvic CT on 05/10/2018 showed enlarged pancreatic pseudocysts. Plan for endoscopic cyst gastrostomy. PTA Apixaban held since 05/06/2018. Prophylactic dose enoxaparin 62m SQ q24h given 05/10/2018-05/13/2018, changed to full dose enoxaparin 531mSQ q12h this morning (last dose given at 0913). Last CBC on 05/12/2018 shows Hgb low, but relatively stable at 8.4, Pltc WNL/stable. SCr (05/13/2018) = 1.1 with CrCl ~ 40 ml/min. Patient transitioned to IV heparin infusion in anticipation of procedure, and pharmacy asked to assist with dosing.  3/24: At 2100 tonight (~ 12 hours after last therapeutic enoxaparin dose), start IV heparin infusion at 900 units/hr Stop IV heparin on 05/15/2018 at 0300 for 0900 procedure per GI orders  Today, 05/15/2018  SCr elevated to 2.77 this am, copious vomiting overnight. Mag & Phos level remain elevated. Unable to perform gastrostomy procedure. NS bolus and rate increased H/H low, stable, Plt wnl (333)  Goal of Therapy:  Heparin level 0.3-0.7 units/ml Monitor platelets by anticoagulation protocol: Yes   Plan:   Resume Heparin at 900 units/hr  Check Heparin level in 8 hr  Daily CBC while on Heparin, daily Hep level when at steady state  Monitor CBC, s/s bleed  Plan procedure tomorrow  GrMinda DittoharmD Pager [phone]/25/2020, 9:32 AM

## 2018-05-16 ENCOUNTER — Encounter (HOSPITAL_COMMUNITY)
Admission: AD | Disposition: A | Discharge status: Home Health | Payer: Self-pay | Source: Home / Self Care | Attending: General Surgery

## 2018-05-16 ENCOUNTER — Inpatient Hospital Stay (HOSPITAL_COMMUNITY): Payer: Medicare Other | Admitting: Anesthesiology

## 2018-05-16 ENCOUNTER — Encounter (HOSPITAL_COMMUNITY): Payer: Self-pay | Admitting: Registered Nurse

## 2018-05-16 DIAGNOSIS — K259 Gastric ulcer, unspecified as acute or chronic, without hemorrhage or perforation: Secondary | ICD-10-CM

## 2018-05-16 DIAGNOSIS — K3189 Other diseases of stomach and duodenum: Secondary | ICD-10-CM

## 2018-05-16 HISTORY — PX: UPPER ESOPHAGEAL ENDOSCOPIC ULTRASOUND (EUS): SHX6562

## 2018-05-16 HISTORY — PX: ESOPHAGOGASTRODUODENOSCOPY (EGD) WITH PROPOFOL: SHX5813

## 2018-05-16 HISTORY — PX: BIOPSY: SHX5522

## 2018-05-16 HISTORY — PX: PANCREATIC STENT PLACEMENT: SHX5539

## 2018-05-16 LAB — GLUCOSE, CAPILLARY
Glucose-Capillary: 104 mg/dL — ABNORMAL HIGH (ref 70–99)
Glucose-Capillary: 105 mg/dL — ABNORMAL HIGH (ref 70–99)
Glucose-Capillary: 111 mg/dL — ABNORMAL HIGH (ref 70–99)
Glucose-Capillary: 115 mg/dL — ABNORMAL HIGH (ref 70–99)
Glucose-Capillary: 124 mg/dL — ABNORMAL HIGH (ref 70–99)
Glucose-Capillary: 91 mg/dL (ref 70–99)

## 2018-05-16 LAB — BASIC METABOLIC PANEL
ANION GAP: 8 (ref 5–15)
BUN: 39 mg/dL — ABNORMAL HIGH (ref 8–23)
CO2: 21 mmol/L — ABNORMAL LOW (ref 22–32)
Calcium: 7.3 mg/dL — ABNORMAL LOW (ref 8.9–10.3)
Chloride: 106 mmol/L (ref 98–111)
Creatinine, Ser: 3.36 mg/dL — ABNORMAL HIGH (ref 0.44–1.00)
GFR calc Af Amer: 15 mL/min — ABNORMAL LOW (ref >60)
GFR calc non Af Amer: 13 mL/min — ABNORMAL LOW (ref >60)
Glucose, Bld: 107 mg/dL — ABNORMAL HIGH (ref 70–99)
Potassium: 4.7 mmol/L (ref 3.5–5.1)
Sodium: 135 mmol/L (ref 135–145)

## 2018-05-16 LAB — HEPARIN LEVEL (UNFRACTIONATED): HEPARIN UNFRACTIONATED: 0.42 [IU]/mL (ref 0.30–0.70)

## 2018-05-16 SURGERY — ESOPHAGOGASTRODUODENOSCOPY (EGD) WITH PROPOFOL
Anesthesia: General

## 2018-05-16 MED ORDER — ONDANSETRON HCL 4 MG/2ML IJ SOLN
INTRAMUSCULAR | Status: DC | PRN
  Administered 2018-05-16: 4 mg via INTRAVENOUS

## 2018-05-16 MED ORDER — CIPROFLOXACIN IN D5W 400 MG/200ML IV SOLN
INTRAVENOUS | Status: AC
  Filled 2018-05-16: qty 200

## 2018-05-16 MED ORDER — DEXAMETHASONE SODIUM PHOSPHATE 10 MG/ML IJ SOLN
INTRAMUSCULAR | Status: DC | PRN
  Administered 2018-05-16: 5 mg via INTRAVENOUS

## 2018-05-16 MED ORDER — FENTANYL CITRATE (PF) 100 MCG/2ML IJ SOLN
INTRAMUSCULAR | Status: AC
  Filled 2018-05-16: qty 2

## 2018-05-16 MED ORDER — CIPROFLOXACIN IN D5W 400 MG/200ML IV SOLN
INTRAVENOUS | Status: DC | PRN
  Administered 2018-05-16: 400 mg via INTRAVENOUS

## 2018-05-16 MED ORDER — OSMOLITE 1.2 CAL PO LIQD
1000.0000 mL | ORAL | Status: DC
  Administered 2018-05-16: 1000 mL
  Filled 2018-05-16 (×7): qty 1000

## 2018-05-16 MED ORDER — FENTANYL CITRATE (PF) 250 MCG/5ML IJ SOLN
INTRAMUSCULAR | Status: DC | PRN
  Administered 2018-05-16 (×2): 50 ug via INTRAVENOUS

## 2018-05-16 MED ORDER — PROPOFOL 10 MG/ML IV BOLUS
INTRAVENOUS | Status: DC | PRN
  Administered 2018-05-16: 120 mg via INTRAVENOUS

## 2018-05-16 MED ORDER — HEPARIN (PORCINE) 25000 UT/250ML-% IV SOLN
900.0000 [IU]/h | INTRAVENOUS | Status: DC
  Administered 2018-05-16 – 2018-05-17 (×2): 900 [IU]/h via INTRAVENOUS
  Filled 2018-05-16 (×2): qty 250

## 2018-05-16 MED ORDER — SUCCINYLCHOLINE CHLORIDE 200 MG/10ML IV SOSY
PREFILLED_SYRINGE | INTRAVENOUS | Status: DC | PRN
  Administered 2018-05-16: 180 mg via INTRAVENOUS

## 2018-05-16 MED ORDER — PHENYLEPHRINE 40 MCG/ML (10ML) SYRINGE FOR IV PUSH (FOR BLOOD PRESSURE SUPPORT)
PREFILLED_SYRINGE | INTRAVENOUS | Status: DC | PRN
  Administered 2018-05-16: 80 ug via INTRAVENOUS
  Administered 2018-05-16: 120 ug via INTRAVENOUS
  Administered 2018-05-16 (×2): 80 ug via INTRAVENOUS

## 2018-05-16 MED ORDER — CIPROFLOXACIN IN D5W 200 MG/100ML IV SOLN
200.0000 mg | Freq: Two times a day (BID) | INTRAVENOUS | Status: DC
  Administered 2018-05-17 – 2018-05-21 (×10): 200 mg via INTRAVENOUS
  Filled 2018-05-16 (×10): qty 100

## 2018-05-16 SURGICAL SUPPLY — 15 items

## 2018-05-16 NOTE — Anesthesia Preprocedure Evaluation (Signed)
Anesthesia Evaluation  Patient identified by MRN, date of birth, ID band Patient awake    Reviewed: Allergy & Precautions, NPO status , Patient's Chart, lab work & pertinent test results  History of Anesthesia Complications (+) PONV, DIFFICULT AIRWAY and history of anesthetic complications  Airway Mallampati: II  TM Distance: >3 FB Neck ROM: Full    Dental  (+) Dental Advisory Given, Chipped   Pulmonary neg pulmonary ROS,    Pulmonary exam normal breath sounds clear to auscultation       Cardiovascular hypertension, Pt. on medications + DVT  Normal cardiovascular exam Rhythm:Regular Rate:Normal     Neuro/Psych negative neurological ROS     GI/Hepatic negative GI ROS, PANCREATIC CANCER SEVERE PROTEIN CALORIE MALNUTRITION   Endo/Other  negative endocrine ROS  Renal/GU Renal InsufficiencyRenal disease     Musculoskeletal negative musculoskeletal ROS (+)   Abdominal   Peds  Hematology  (+) Blood dyscrasia (Eliquis), anemia ,   Anesthesia Other Findings Day of surgery medications reviewed with the patient.  Reproductive/Obstetrics                             Anesthesia Physical Anesthesia Plan  ASA: III  Anesthesia Plan: General   Post-op Pain Management:    Induction: Intravenous and Rapid sequence  PONV Risk Score and Plan: 4 or greater and Ondansetron, Dexamethasone and Treatment may vary due to age or medical condition  Airway Management Planned: Oral ETT  Additional Equipment: None  Intra-op Plan:   Post-operative Plan: Extubation in OR  Informed Consent: I have reviewed the patients History and Physical, chart, labs and discussed the procedure including the risks, benefits and alternatives for the proposed anesthesia with the patient or authorized representative who has indicated his/her understanding and acceptance.     Dental advisory given  Plan Discussed with:  CRNA  Anesthesia Plan Comments:         Anesthesia Quick Evaluation

## 2018-05-16 NOTE — Interval H&P Note (Signed)
History and Physical Interval Note:  05/16/2018 11:38 AM  Cindy Bowman  has presented today for surgery, with the diagnosis of Pancreatic pseudocyst, pancreatic cancer.  The various methods of treatment have been discussed with the patient and family. After consideration of risks, benefits and other options for treatment, the patient has consented to  Procedure(s): ESOPHAGOGASTRODUODENOSCOPY (EGD) WITH PROPOFOL (N/A) UPPER ESOPHAGEAL ENDOSCOPIC ULTRASOUND (EUS) (N/A) AXIOS STENT PLACEMENT (N/A) as a surgical intervention.  The patient's history has been reviewed, patient examined, no change in status, stable for surgery.  I have reviewed the patient's chart and labs.  Questions were answered to the patient's satisfaction.    The risks of EUS including bleeding, infection, aspiration pneumonia and intestinal perforation were discussed as was the possibility it may not give a definitive diagnosis.  [employer]

## 2018-05-16 NOTE — Progress Notes (Signed)
[place] for IV heparin Indication: history of LLE DVT  Allergies  Allergen Reactions  . Nsaids Other (See Comments)    Decreased GFR  . Penicillins     Yeast infection Did it involve swelling of the face/tongue/throat, SOB, or low BP? No Did it involve sudden or severe rash/hives, skin peeling, or any reaction on the inside of your mouth or nose? No Did you need to seek medical attention at a hospital or doctor's office? No When did it last happen?Unknown If all above answers are "NO", may proceed with cephalosporin use.     Patient Measurements: Height: _0  (160 cm) Weight: 121 lb 0.5 oz (54.9 kg) IBW/kg (Calculated) : 52.4 Heparin Dosing Weight: actual body weight   Vital Signs: Temp: 97.8 F (36.6 C) (03/26 1324) Temp Source: Axillary (03/26 1324) BP: 123/39 (03/26 1352) Pulse Rate: 94 (03/26 1352)  Labs: Recent Labs    05/14/18 2015 05/15/18 0316 05/15/18 1520 05/15/18 1802 05/16/18 0356  HGB 9.5* 9.6*  --   --   --   HCT 30.1* 31.5*  --   --   --   PLT 307 333  --   --   --   APTT 40*  --   --   --   --   LABPROT 14.3  --   --   --   --   INR 1.1  --   --   --   --   HEPARINUNFRC  --   --   --  0.32 0.42  CREATININE  --  2.77* 3.35*  --  3.36*   Estimated Creatinine Clearance: 13.1 mL/min (A) (by C-G formula based on SCr of 3.36 mg/dL (H)).  Medical History: Past Medical History:  Diagnosis Date  . Anemia   . Complication of anesthesia   . Difficult airway for intubation    2018 Oophorectomy, CRNA unable to visualized vocal cords with MAC 3 bladed. Easy ventilations O2 sats 99%. Rolls placed under the shoulders and pillows under the head Repeat Laryngoscopy with MAC 3 blade > Visualization not much better. O2 sats 99%. Intubation with Glidescope on the Second Attempt. BBSE good ETCO 2 O 2 sats 99%.   . Essential hypertension   . Pancreatic cancer ([place])   . PONV (postoperative nausea and vomiting)   . 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 Abdominal/pelvic CT on 05/10/2018 showed enlarged pancreatic pseudocysts. Plan for endoscopic cyst gastrostomy. PTA Apixaban held since 05/06/2018. Prophylactic dose enoxaparin 2m SQ q24h given 05/10/2018-05/13/2018, changed to full dose enoxaparin 562mSQ q12h this morning (last dose given at 0913). Last CBC on 05/12/2018 shows Hgb low, but relatively stable at 8.4, Pltc WNL/stable. SCr (05/13/2018) = 1.1 with CrCl ~ 40 ml/min. Patient transitioned to IV heparin infusion in anticipation of procedure, and pharmacy asked to assist with dosing.  3/24: At 2100 tonight (~ 12 hours after last therapeutic enoxaparin dose), start IV heparin infusion at 900 units/hr Stop IV heparin on 05/15/2018 at 0300 for 0900 procedure per GI orders  Today, 05/16/2018  0400 Hep level 0.42 on Heparin 950 units/hr 0545 Heparin held for EUS and stent placement > cystogastrostomy Per post-op progress notes: can resume Heparin infusion at 2200 tonight  Goal of Therapy:  Heparin level 0.3-0.7 units/ml Monitor platelets by anticoagulation protocol:  Yes   Plan:   Resume Heparin at 900 units/hr at 2200 tonight  Check Heparin level in 8 hr  Daily CBC while on Heparin, daily Hep level when at steady state  Monitor CBC, s/s bleed  Minda Ditto PharmD Pager [phone] 05/16/2018, 2:19 PM

## 2018-05-16 NOTE — Progress Notes (Signed)
ANTICOAGULATION CONSULT NOTE - Follow Up Consult  Pharmacy Consult for Heparin Indication: history of LLE DVT   Allergies  Allergen Reactions  . Nsaids Other (See Comments)    Decreased GFR  . Penicillins     Yeast infection Did it involve swelling of the face/tongue/throat, SOB, or low BP? No Did it involve sudden or severe rash/hives, skin peeling, or any reaction on the inside of your mouth or nose? No Did you need to seek medical attention at a hospital or doctor's office? No When did it last happen?Unknown If all above answers are "NO", may proceed with cephalosporin use.      Patient Measurements: Height: 5\' 3"  (160 cm) Weight: 123 lb 14.4 oz (56.2 kg) IBW/kg (Calculated) : 52.4 Heparin Dosing Weight:   Vital Signs: Temp: 98.2 F (36.8 C) (03/26 0118) Temp Source: Oral (03/26 0118) BP: 131/55 (03/26 0118) Pulse Rate: 112 (03/26 0118)  Labs: Recent Labs    05/14/18 2015 05/15/18 0316 05/15/18 1520 05/15/18 1802 05/16/18 0356  HGB 9.5* 9.6*  --   --   --   HCT 30.1* 31.5*  --   --   --   PLT 307 333  --   --   --   APTT 40*  --   --   --   --   LABPROT 14.3  --   --   --   --   INR 1.1  --   --   --   --   HEPARINUNFRC  --   --   --  0.32 0.42  CREATININE  --  2.77* 3.35*  --  3.36*    Estimated Creatinine Clearance: 13.1 mL/min (A) (by C-G formula based on SCr of 3.36 mg/dL (H)).   Medications:  Infusions:  . sodium chloride    . sodium chloride    . sodium chloride 100 mL/hr at 05/16/18 0200  . heparin 950 Units/hr (05/16/18 0200)    Assessment: Patient with heparin level at goal.  Heparin to be turned off at 0545 per prior order.  No heparin issues noted.  Goal of Therapy:  Heparin level 0.3-0.7 units/ml Monitor platelets by anticoagulation protocol: Yes   Plan:  Continue heparin drip at current rate Recheck if/when restart heparin   Tyler Deis, Shea Stakes Crowford 05/16/2018,4:58 AM

## 2018-05-16 NOTE — Op Note (Signed)
[facility] Patient Name: Cindy Bowman Procedure Date: 05/16/2018 MRN: [id number] Attending MD: Justice Britain , MD Date of Birth: 10-17-1950 CSN: [id number] Age: 68 Admit Type: Inpatient Procedure:                Upper EUS Indications:              Pancreatic cyst on CT scan, Epigastric abdominal                            pain, Weight loss, Failure to thrive, Nausea with                            vomiting Providers:                Justice Britain, MD, Carlyn Reichert, RN, Glori Bickers, RN, Elspeth Cho Tech., Technician, Marla Roe, CRNA Referring MD:             Stark Klein MD, MD, Mena Pauls, MD Medicines:                General Anesthesia, Cipro 250 mg IV Complications:            No immediate complications. Estimated Blood Loss:     Estimated blood loss was minimal. Procedure:                Pre-Anesthesia Assessment:                           - Prior to the procedure, a History and Physical                            was performed, and patient medications and                            allergies were reviewed. The patient's tolerance of                            previous anesthesia was also reviewed. The risks                            and benefits of the procedure and the sedation                            options and risks were discussed with the patient.                            All questions were answered, and informed consent                            was obtained. Prior Anticoagulants: The patient  last took Eliquis (apixaban) 3 days prior to the                            procedure and last took heparin on the day of the                            procedure. ASA Grade Assessment: IV - A patient                            with severe systemic disease that is a constant                            threat to life. After reviewing the risks and                             benefits, the patient was deemed in satisfactory                            condition to undergo the procedure.                           After obtaining informed consent, the endoscope was                            passed under direct vision. Throughout the                            procedure, the patient's blood pressure, pulse, and                            oxygen saturations were monitored continuously. The                            [id number] ([id number]) Olympus therapeutic endoscope                            was introduced through the mouth, and advanced to                            the second part of duodenum. The [id number]                            ([id number]) Olympus duodenoscope was introduced                            through the mouth, and advanced to the second part                            of duodenum. The [id number] ([id number]) Olympus Linear                            EUS was introduced through the mouth, and advanced  to the duodenum for ultrasound examination from the                            stomach and duodenum. The upper EUS was                            accomplished without difficulty. The patient                            tolerated the procedure. Scope In: Scope Out: Findings:      ENDOSCOPIC FINDING: :      No gross lesions were noted in the entire esophagus.      The Z-line was regular and was found 36 cm from the incisors.      Bilious fluid was found in the entire examined stomach. This was       suctioned - a total of 800 cc were removed.      Two cratered gastric ulcers with a clean ulcer base (Forrest Class III)       were found on the lesser curvature of the stomach. The largest lesion       was 8 mm in largest dimension.      Extrinsic compression on the stomach was found on the posterior wall of       the gastric body.      No other gross lesions were noted in the entire examined stomach.       Biopsies were  taken with a cold forceps for histology and Helicobacter       pylori testing.      No gross lesions were noted in the duodenal bulb, in the first portion       of the duodenum, in the second portion of the duodenum, in the major       papilla and in the third portion of the duodenum.      ENDOSONOGRAPHIC FINDING: :      A hypoechoic and septated lesion suggestive of a cyst was identified in       the peripancreatic region. It is not in obvious communication with the       pancreatic duct. The lesion measured 78 mm by 66 mm in maximal       cross-sectional diameter. There was a single compartment thinly       septated. The outer wall of the lesion was thick. There was no       associated mass. There was internal debris within the fluid-filled       cavity. The decision was made to create a cystogastrostomy using the       AXIOS stent system. Once an appropriate position in the stomach was       identified, the common wall between the stomach and the cyst was       interrogated utilizing color Doppler imaging to identify interposed       vessels. The stomach wall and the cyst were punctured under       endosonographic guidance using the AXIOS stent and electrocautery device       using current applied to the cautery tip and then used to increase the       diameter of the stoma. The AXIOS device was advanced into the cyst, and       a 15 x 10 mm AXIOS stent  was placed with the flanges in close       approximation to the walls of the cyst and the stomach through the       cystogastrostomy. The stent was successfully placed. A TTS dilator was       passed through the scope into the cyst cavity. Dilation with a       12-13.5-15 mm pyloric balloon dilator was performed up to 15 mm. The       cyst was then entered and suctioning of the fluid was performed. 700 cc       of fluid was removed. The cyst was partially filled with fluid and       necrotic fatty tissue that was pasty and adherent to the  cyst wall.       Lavage of the area was performed using copious amounts, resulting in       clearance with good visualization. A 4 cm 10 Fr Hobbs double pigtail       stent was placed through the AXIOS stent and into the pseudocyst through       the cystogastrostomy using a stent introducer set in effort of       decreasing the risk of necroma causing blockage of the AXIOS stent. The       stent was successfully placed.      Endosonographic imaging in the visualized portion of the liver showed no       mass-lesion. Impression:               EGD Impression:                           - No gross lesions in esophagus.                           - Z-line regular, 36 cm from the incisors.                           - Bilious gastric fluid. Suctioned.                           - Gastric ulcers with a clean ulcer base (Forrest                            Class III).                           - Extrinsic compression in the gastric body                            (posterior wall).                           - No other gross lesions in the stomach. Biopsied                            for HP.                           - No gross lesions in the duodenal bulb, in the  first portion of the duodenum, in the second                            portion of the duodenum, in the major papilla and                            in the third portion of the duodenum.                           EUS Impression:                           - A cystic lesion was seen in the peripancreatic                            region. Tissue has not been obtained. However, the                            endosonographic appearance is consistent with a                            pancreatic pseudocyst and with some Walled-Off                            Necrosis. Dilated AXIOS stent tract. Double pigtail                            placed. Moderate Sedation:      Not Applicable - Patient had care per  Anesthesia. Recommendation:           - The patient will be observed post-procedure,                            until all discharge criteria are met.                           - Return patient to hospital ward for ongoing care.                           - Clear liquid diet may be considered this evening.                           - Observe patient's clinical course.                           - Continue Ciprofloxacin BID while in hospital (IV                            for now until knowing that she can tolerate PO).                           - Hold PPI to allow acid to enter cyst cavity to  aid in debridement.                           - Await path results.                           - May restart tube feeds this afternoon/evening.                           - Monitor Hgb/Hct daily.                           - Patient will be at risk of bleeding post                            procedure, in effort of trying to decrease risk as                            much as possible would plan to restart Heparin in                            8-hours (1000PM) without bolus. Monitor closely due                            to need for anticoagulation.                           - Tentative plan for necrosectomy on 3/30 in                            hospital.                           - The findings and recommendations were discussed                            with the patient.                           - The findings and recommendations were discussed                            with the patient's family.                           - The findings and recommendations were discussed                            with the referring physician. Procedure Code(s):        --- Professional ---                           [phone], Esophagogastroduodenoscopy, flexible,                            transoral; with transmural drainage of pseudocyst                            (  includes placement of  transmural drainage                            catheter[s]/stent[s], when performed, and                            endoscopic ultrasound, when performed)                           43237, Esophagogastroduodenoscopy, flexible,                            transoral; with endoscopic ultrasound examination                            limited to the esophagus, stomach or duodenum, and                            adjacent structures                           43245, Esophagogastroduodenoscopy, flexible,                            transoral; with dilation of gastric/duodenal                            stricture(s) (eg, balloon, bougie)                           84696, Unlisted procedure, pancreas Diagnosis Code(s):        --- Professional ---                           K25.9, Gastric ulcer, unspecified as acute or                            chronic, without hemorrhage or perforation                           K31.89, Other diseases of stomach and duodenum                           K86.2, Cyst of pancreas                           R10.13, Epigastric pain                           R63.4, Abnormal weight loss                           R62.7, Adult failure to thrive                           R11.2, Nausea with vomiting, unspecified CPT copyright 2019 American Medical Association. All rights reserved. The codes documented in this report are preliminary and upon coder review may  be  revised to meet current compliance requirements. Justice Britain, MD 05/16/2018 2:13:31 PM Number of Addenda: 0

## 2018-05-16 NOTE — Transfer of Care (Signed)
Immediate Anesthesia Transfer of Care Note  Patient: [name]  Procedure(s) Performed: ESOPHAGOGASTRODUODENOSCOPY (EGD) WITH PROPOFOL (N/A ) UPPER ESOPHAGEAL ENDOSCOPIC ULTRASOUND (EUS) (N/A ) AXIOS STENT PLACEMENT (N/A ) BIOPSY  Patient Location: PACU  Anesthesia Type:General  Level of Consciousness: sedated  Airway & Oxygen Therapy: Patient Spontanous Breathing and Patient connected to face mask oxygen  Post-op Assessment: Report given to RN and Post -op Vital signs reviewed and stable  Post vital signs: Reviewed and stable  Last Vitals:  Vitals Value Taken Time  BP    Temp    Pulse 99 05/16/2018  1:24 PM  Resp 23 05/16/2018  1:24 PM  SpO2 100 % 05/16/2018  1:24 PM  Vitals shown include unvalidated device data.  Last Pain:  Vitals:   05/16/18 1127  TempSrc: Oral  PainSc: 0-No pain      Patients Stated Pain Goal: 4 ([birth year] 7948)  Complications: No apparent anesthesia complications

## 2018-05-16 NOTE — Plan of Care (Signed)
  Problem: Health Behavior/Discharge Planning: Goal: Ability to manage health-related needs will improve Outcome: Progressing   Problem: Clinical Measurements: Goal: Ability to maintain clinical measurements within normal limits will improve Outcome: Progressing Goal: Will remain free from infection Outcome: Progressing Goal: Cardiovascular complication will be avoided Outcome: Progressing   Problem: Activity: Goal: Risk for activity intolerance will decrease Outcome: Progressing   Problem: Nutrition: Goal: Adequate nutrition will be maintained Outcome: Progressing   Problem: Safety: Goal: Ability to remain free from injury will improve Outcome: Progressing

## 2018-05-16 NOTE — Progress Notes (Addendum)
Patient ID: Dynver Clemson, female   DOB: 25-Oct-1950, 68 y.o.   MRN: [id number]  Brief GI Note: Patient had less vomiting over night. She has vomited bilious emesis x 5 this am. Cr 3.36 >> 3.35 >> 2.77 >> 1.10.  Receiving Zofran 4mg  IV Q 6hrs. Compazine 10mg  given at 0800. Phenergan 12.5mg  IV at 0920.  No abdominal pain. Patient to proceed with  Cyst gastrostomy with Dr. Rush Landmark. Vomiting is most likely due to large pancreatic pseuodcyst compressing on her stomach. Heparin gtt dc'd at 5:45am. TF dc'd at 3am. I spoke to  Dr. Barry Dienes yesterday afternoon regarding AKI with creatinine rising. Dr. Barry Dienes to facilitate nephrology consult. NS 1000cc bolus was given at 5pm. NS 100cc/hr. Urine output 250cc past 24hrs. Temp 98.2. HR 115. BP 122/55. Lungs with few crackles bases.

## 2018-05-16 NOTE — Anesthesia Postprocedure Evaluation (Signed)
Anesthesia Post Note  Patient: Cindy Bowman  Procedure(s) Performed: ESOPHAGOGASTRODUODENOSCOPY (EGD) WITH PROPOFOL (N/A ) UPPER ESOPHAGEAL ENDOSCOPIC ULTRASOUND (EUS) (N/A ) AXIOS STENT PLACEMENT (N/A ) BIOPSY     Patient location during evaluation: PACU Anesthesia Type: General Level of consciousness: sedated and patient cooperative Pain management: pain level controlled Vital Signs Assessment: post-procedure vital signs reviewed and stable Respiratory status: spontaneous breathing Cardiovascular status: stable Anesthetic complications: no    Last Vitals:  Vitals:   05/16/18 1352 05/16/18 1424  BP: (!) 123/39 (!) 114/40  Pulse: 94 92  Resp: (!) 21 14  Temp:  37.1 C  SpO2: 94% 100%    Last Pain:  Vitals:   05/16/18 1424  TempSrc: Oral  PainSc:                  Nolon Nations

## 2018-05-16 NOTE — Progress Notes (Signed)
NUTRITION NOTE RD working remotely.   Consult received to re-start previous TF order today at 5 PM. Full follow-up assessment done yesterday AM (RD note at 9:52 AM on 3/25).  Per review of that note, recommendation for resuming Osmolite 1.2 @ 50 ml/hr via J-tube. This regimen provides 1440 kcal, 67 grams of protein, and 984 ml free water. This provides ~75% estimated nutrition needs.  Monitor magnesium, potassium, and phosphorus daily for at least 3 days, MD to replete as needed, as pt is at risk for refeeding syndrome givenmalnutrition status and current N/V.  RD will continue to follow per protocol.   Estimated Nutritional Needs:  Kcal:  1650-1850 kcal Protein:  80-100 grams Fluid:  >/= 1.6 L/day    Jarome Matin, MS, RD, LDN, [facility] Inpatient Clinical Dietitian Pager # [phone] After hours/weekend pager # [phone]

## 2018-05-16 NOTE — Anesthesia Procedure Notes (Signed)
Procedure Name: Intubation Date/Time: 05/16/2018 12:08 PM Performed by: Talbot Grumbling, CRNA Pre-anesthesia Checklist: Patient identified, Emergency Drugs available, Suction available and Patient being monitored Patient Re-evaluated:Patient Re-evaluated prior to induction Oxygen Delivery Method: Circle system utilized Preoxygenation: Pre-oxygenation with 100% oxygen Induction Type: IV induction, Cricoid Pressure applied and Rapid sequence Laryngoscope Size: Glidescope (LoPro 3) Grade View: Grade I Tube type: Oral Tube size: 7.0 mm Number of attempts: 1 (intital tube placement-cuff leaking. Patient suctioned oropharynx prior to revisualization with Glidescope. Ett withdrawn and new ETT placed without difficulty. ) Airway Equipment and Method: Stylet and Video-laryngoscopy Placement Confirmation: ETT inserted through vocal cords under direct vision,  positive ETCO2 and breath sounds checked- equal and bilateral Secured at: 21 cm Tube secured with: Tape Dental Injury: Teeth and Oropharynx as per pre-operative assessment

## 2018-05-16 NOTE — Progress Notes (Signed)
Progress Note: General Surgery Service   Assessment/Plan: Active Problems:   Pancreatic cancer metastasized to liver [facility])  s/p Procedure(s): LAPAROSCOPY DIAGNOSTIC AND J TUBE PLACEMENT (FEEDING TUBE) 05/09/2018  Pancreatic cancer stage IV, liver mets.- Follow-up with Dr. Grayland Ormond to discuss next steps once an outpatient.    Persistent nausea/vomiting- not related to tube feeds, continue multimodal medications, replace electrolytes (stable today).  Likely related to enlarging pancreatic pseudocyst. GI planning endoscopic cyst gastrostomy today.  Severe protein cal malnutrition- increase goal to 65 ml/hr as originally pt was getting 75% calories via tube feeds and now has 0 calories from oral nutrition.    Dehydration -  maintenance IV fluids, and increased free water flushes.     Acute kidney injury - stable.   PT consult for severe deconditioning. Case management consult to get [facility] set up.  Will need HH J-tube feeds and probably HH PT Hypertension-Home meds   LOS: 7 days  Chief Complaint/Subjective: Awaiting endoscopy  Objective: Vital signs in last 24 hours: Temp:  [98.2 F (36.8 C)-99 F (37.2 C)] 98.2 F (36.8 C) (03/26 4496) Pulse Rate:  [102-115] 115 (03/26 0632) Resp:  [14-20] 20 (03/26 7591) BP: (95-141)/(52-55) 122/55 (03/26 6384) SpO2:  [100 %] 100 % (03/26 6659) Weight:  [54.9 kg] 54.9 kg (03/26 0632) Last BM Date: 05/16/18  Intake/Output from previous day: 03/25 0701 - 03/26 0700 In: 4620.8 [P.O.:90; I.V.:2093.2; NG/GT:1037.3; IV Piggyback:1000.3] Out: 250 [Emesis/NG output:250] Intake/Output this shift: No intake/output data recorded.  Lungs: Breathing comfortably Abd: soft, NT, epigastric distention, but not lower abdominal distention, tube in position and functional Extremities: no edema Neuro: AOx4  Lab Results: CBC  Recent Labs    05/14/18 2015 05/15/18 0316  WBC 8.5 9.7  HGB 9.5* 9.6*  HCT 30.1* 31.5*  PLT 307 333   BMET Recent Labs     05/15/18 1520 05/16/18 0356  NA 137 135  K 4.6 4.7  CL 105 106  CO2 23 21*  GLUCOSE 127* 107*  BUN 41* 39*  CREATININE 3.35* 3.36*  CALCIUM 7.7* 7.3*   PT/INR Recent Labs    05/14/18 2015  LABPROT 14.3  INR 1.1   ABG No results for input(s): PHART, HCO3 in the last 72 hours.  Invalid input(s): PCO2, PO2  Studies/Results:  Anti-infectives: Anti-infectives (From admission, onward)   Start     Dose/Rate Route Frequency Ordered Stop   05/09/18 2000  ciprofloxacin (CIPRO) IVPB 400 mg     400 mg 200 mL/hr over 60 Minutes Intravenous Every 12 hours 05/09/18 1136 05/09/18 2101   05/09/18 0600  ciprofloxacin (CIPRO) IVPB 400 mg     400 mg 200 mL/hr over 60 Minutes Intravenous On call to O.R. 05/09/18 0546 05/09/18 0810      Medications: Scheduled Meds: . fentaNYL  1 patch Transdermal Q72H  . folic acid  1 mg Oral Daily  . gabapentin  300 mg Oral BID  . insulin aspart  0-9 Units Subcutaneous Q4H  . lipase/protease/amylase  36,000-72,000 Units Oral TID WC  . losartan  50 mg Oral Daily  . ondansetron (ZOFRAN) IV  4 mg Intravenous Q6H  . oxyCODONE  5 mg Per Tube Q8H  . potassium chloride  20 mEq Oral Daily   Continuous Infusions: . sodium chloride    . sodium chloride    . sodium chloride 100 mL/hr at 05/16/18 0600   PRN Meds:.acetaminophen **OR** acetaminophen, diphenhydrAMINE **OR** diphenhydrAMINE, hydrALAZINE, HYDROmorphone (DILAUDID) injection, lidocaine-prilocaine, loperamide, methocarbamol, prochlorperazine, promethazine, promethazine, sodium chloride flush  Cindy Bowman, [facility]

## 2018-05-17 ENCOUNTER — Encounter (HOSPITAL_COMMUNITY): Payer: Self-pay | Admitting: Gastroenterology

## 2018-05-17 ENCOUNTER — Telehealth: Payer: Medicare Other | Admitting: Gastroenterology

## 2018-05-17 ENCOUNTER — Inpatient Hospital Stay (HOSPITAL_COMMUNITY): Payer: Medicare Other

## 2018-05-17 ENCOUNTER — Telehealth: Payer: Self-pay

## 2018-05-17 DIAGNOSIS — K8689 Other specified diseases of pancreas: Secondary | ICD-10-CM

## 2018-05-17 LAB — CBC WITH DIFFERENTIAL/PLATELET
Abs Immature Granulocytes: 0.1 10*3/uL — ABNORMAL HIGH (ref 0.00–0.07)
Basophils Absolute: 0 10*3/uL (ref 0.0–0.1)
Basophils Relative: 0 %
Eosinophils Absolute: 0.1 10*3/uL (ref 0.0–0.5)
Eosinophils Relative: 1 %
HCT: 23.3 % — ABNORMAL LOW (ref 36.0–46.0)
Hemoglobin: 7.3 g/dL — ABNORMAL LOW (ref 12.0–15.0)
IMMATURE GRANULOCYTES: 1 %
Lymphocytes Relative: 11 %
Lymphs Abs: 1.6 10*3/uL (ref 0.7–4.0)
MCH: 32 pg (ref 26.0–34.0)
MCHC: 31.3 g/dL (ref 30.0–36.0)
MCV: 102.2 fL — ABNORMAL HIGH (ref 80.0–100.0)
Monocytes Absolute: 1.6 10*3/uL — ABNORMAL HIGH (ref 0.1–1.0)
Monocytes Relative: 12 %
NEUTROS ABS: 10.6 10*3/uL — AB (ref 1.7–7.7)
NEUTROS PCT: 75 %
Platelets: 230 10*3/uL (ref 150–400)
RBC: 2.28 MIL/uL — ABNORMAL LOW (ref 3.87–5.11)
RDW: 16.5 % — ABNORMAL HIGH (ref 11.5–15.5)
WBC: 14 10*3/uL — ABNORMAL HIGH (ref 4.0–10.5)
nRBC: 0 % (ref 0.0–0.2)

## 2018-05-17 LAB — BASIC METABOLIC PANEL
Anion gap: 7 (ref 5–15)
BUN: 42 mg/dL — AB (ref 8–23)
CHLORIDE: 112 mmol/L — AB (ref 98–111)
CO2: 18 mmol/L — ABNORMAL LOW (ref 22–32)
CREATININE: 3.01 mg/dL — AB (ref 0.44–1.00)
Calcium: 7.5 mg/dL — ABNORMAL LOW (ref 8.9–10.3)
GFR calc Af Amer: 18 mL/min — ABNORMAL LOW (ref >60)
GFR calc non Af Amer: 15 mL/min — ABNORMAL LOW (ref >60)
Glucose, Bld: 121 mg/dL — ABNORMAL HIGH (ref 70–99)
Potassium: 4.1 mmol/L (ref 3.5–5.1)
Sodium: 137 mmol/L (ref 135–145)

## 2018-05-17 LAB — CBC
HEMATOCRIT: 24.7 % — AB (ref 36.0–46.0)
Hemoglobin: 7.3 g/dL — ABNORMAL LOW (ref 12.0–15.0)
MCH: 30.9 pg (ref 26.0–34.0)
MCHC: 29.6 g/dL — ABNORMAL LOW (ref 30.0–36.0)
MCV: 104.7 fL — ABNORMAL HIGH (ref 80.0–100.0)
Platelets: 267 10*3/uL (ref 150–400)
RBC: 2.36 MIL/uL — ABNORMAL LOW (ref 3.87–5.11)
RDW: 16.1 % — AB (ref 11.5–15.5)
WBC: 13.6 10*3/uL — ABNORMAL HIGH (ref 4.0–10.5)
nRBC: 0 % (ref 0.0–0.2)

## 2018-05-17 LAB — GLUCOSE, CAPILLARY
Glucose-Capillary: 109 mg/dL — ABNORMAL HIGH (ref 70–99)
Glucose-Capillary: 105 mg/dL — ABNORMAL HIGH (ref 70–99)
Glucose-Capillary: 124 mg/dL — ABNORMAL HIGH (ref 70–99)
Glucose-Capillary: 125 mg/dL — ABNORMAL HIGH (ref 70–99)
Glucose-Capillary: 104 mg/dL — ABNORMAL HIGH (ref 70–99)

## 2018-05-17 LAB — HEPARIN LEVEL (UNFRACTIONATED)
Heparin Unfractionated: 0.29 IU/mL — ABNORMAL LOW (ref 0.30–0.70)
Heparin Unfractionated: <0.1 [IU]/mL — ABNORMAL LOW (ref 0.30–0.70)

## 2018-05-17 MED ORDER — HEPARIN (PORCINE) 25000 UT/250ML-% IV SOLN
950.0000 [IU]/h | INTRAVENOUS | Status: DC
  Administered 2018-05-17: 950 [IU]/h via INTRAVENOUS
  Filled 2018-05-17: qty 250

## 2018-05-17 MED ORDER — HEPARIN BOLUS VIA INFUSION
2000.0000 [IU] | Freq: Once | INTRAVENOUS | Status: AC
  Administered 2018-05-17: 2000 [IU] via INTRAVENOUS
  Filled 2018-05-17: qty 2000

## 2018-05-17 MED ORDER — FAMOTIDINE IN NACL 20-0.9 MG/50ML-% IV SOLN
20.0000 mg | Freq: Every day | INTRAVENOUS | Status: DC
  Administered 2018-05-17 – 2018-05-21 (×5): 20 mg via INTRAVENOUS
  Filled 2018-05-17 (×5): qty 50

## 2018-05-17 MED ORDER — HEPARIN (PORCINE) 25000 UT/250ML-% IV SOLN
1050.0000 [IU]/h | INTRAVENOUS | Status: DC
  Administered 2018-05-17: 950 [IU]/h via INTRAVENOUS
  Filled 2018-05-17 (×2): qty 250

## 2018-05-17 MED ORDER — GABAPENTIN 100 MG PO CAPS
200.0000 mg | ORAL_CAPSULE | Freq: Every day | ORAL | Status: DC
  Administered 2018-05-17 – 2018-05-18 (×2): 200 mg via ORAL
  Filled 2018-05-17 (×2): qty 2

## 2018-05-17 MED ORDER — PANTOPRAZOLE SODIUM 40 MG PO TBEC
40.0000 mg | DELAYED_RELEASE_TABLET | Freq: Every day | ORAL | Status: DC
  Administered 2018-05-17: 40 mg via ORAL
  Filled 2018-05-17: qty 1

## 2018-05-17 MED ORDER — FAMOTIDINE IN NACL 20-0.9 MG/50ML-% IV SOLN: 20.0000 mg | Freq: Two times a day (BID) | INTRAVENOUS | Status: DC

## 2018-05-17 MED ORDER — ALUM & MAG HYDROXIDE-SIMETH 200-200-20 MG/5ML PO SUSP
30.0000 mL | Freq: Four times a day (QID) | ORAL | Status: DC | PRN
  Administered 2018-05-17: 30 mL via ORAL
  Filled 2018-05-17 (×2): qty 30

## 2018-05-17 NOTE — Progress Notes (Signed)
Progress Note: General Surgery Service   Assessment/Plan: Active Problems:   Pancreatic cancer metastasized to liver [facility])  s/p Procedure(s): LAPAROSCOPY DIAGNOSTIC AND J TUBE PLACEMENT (FEEDING TUBE) 05/09/2018  Pancreatic cancer stage IV, liver mets.- Follow-up with Dr. Grayland Ormond to discuss next steps once an outpatient.    Persistent nausea/vomiting- not related to tube feeds, continue multimodal medications, replace electrolytes (stable today).  Likely related to enlarging pancreatic pseudocyst. Dr. Rush Landmark got out around 700 mL fluid.  Nausea improving.    Severe protein cal malnutrition- increase goal to 65 ml/hr as originally pt was getting 75% calories via tube feeds and now has 0 calories from oral nutrition.    Dehydration -  maintenance IV fluids, and increased free water flushes.     Acute kidney injury - stable.   PT consult for severe deconditioning. Case management consult to get [facility] set up.  Will need HH J-tube feeds and probably HH PT Hypertension-Home meds on hold for now.   Tentatively home Tuesday if no additional issues.     LOS: 8 days  Chief Complaint/Subjective: No n/v this AM.  Tolerated ginger ale.    Objective: Vital signs in last 24 hours: Temp:  [97.8 F (36.6 C)-98.9 F (37.2 C)] 98.6 F (37 C) (03/27 0530) Pulse Rate:  [88-101] 101 (03/27 0906) Resp:  [14-28] 16 (03/27 0530) BP: (110-144)/(35-60) 144/60 (03/27 0906) SpO2:  [94 %-100 %] 98 % (03/27 0530) Last BM Date: 05/16/18  Intake/Output from previous day: 03/26 0701 - 03/27 0700 In: 3845.5 [P.O.:240; I.V.:2955.6; NG/GT:350; IV Piggyback:299.9] Out: 0  Intake/Output this shift: Total I/O In: 617.5 [I.V.:417.5; NG/GT:200] Out: 0   Lungs: Breathing comfortably Abd: soft, NT, epigastric distention significantly less. no lower abdominal distention, tube in position and functional Extremities: no edema Neuro: AOx4  Lab Results: CBC  Recent Labs    05/15/18 0316 05/17/18 0346   WBC 9.7 13.6*  HGB 9.6* 7.3*  HCT 31.5* 24.7*  PLT 333 267   BMET Recent Labs    05/16/18 0356 05/17/18 0346  NA 135 137  K 4.7 4.1  CL 106 112*  CO2 21* 18*  GLUCOSE 107* 121*  BUN 39* 42*  CREATININE 3.36* 3.01*  CALCIUM 7.3* 7.5*   PT/INR Recent Labs    05/14/18 2015  LABPROT 14.3  INR 1.1   ABG No results for input(s): PHART, HCO3 in the last 72 hours.  Invalid input(s): PCO2, PO2  Studies/Results:  Anti-infectives: Anti-infectives (From admission, onward)   Start     Dose/Rate Route Frequency Ordered Stop   05/16/18 2355  ciprofloxacin (CIPRO) IVPB 200 mg     200 mg 100 mL/hr over 60 Minutes Intravenous Every 12 hours 05/16/18 1511     05/09/18 2000  ciprofloxacin (CIPRO) IVPB 400 mg     400 mg 200 mL/hr over 60 Minutes Intravenous Every 12 hours 05/09/18 1136 05/09/18 2101   05/09/18 0600  ciprofloxacin (CIPRO) IVPB 400 mg     400 mg 200 mL/hr over 60 Minutes Intravenous On call to O.R. 05/09/18 0546 05/09/18 0810      Medications: Scheduled Meds: . fentaNYL  1 patch Transdermal Q72H  . folic acid  1 mg Oral Daily  . gabapentin  200 mg Oral QHS  . insulin aspart  0-9 Units Subcutaneous Q4H  . lipase/protease/amylase  36,000-72,000 Units Oral TID WC  . losartan  50 mg Oral Daily  . ondansetron (ZOFRAN) IV  4 mg Intravenous Q6H  . oxyCODONE  5 mg Per  Tube Q8H  . pantoprazole  40 mg Oral Q1200  . potassium chloride  20 mEq Oral Daily   Continuous Infusions: . sodium chloride 100 mL/hr at 05/17/18 0600  . sodium chloride Stopped (05/16/18 1325)  . ciprofloxacin 200 mg (05/17/18 1210)  . feeding supplement (OSMOLITE 1.2 CAL) 1,000 mL (05/16/18 1923)  . heparin 950 Units/hr (05/17/18 1004)   PRN Meds:.acetaminophen **OR** acetaminophen, alum & mag hydroxide-simeth, diphenhydrAMINE **OR** diphenhydrAMINE, hydrALAZINE, HYDROmorphone (DILAUDID) injection, lidocaine-prilocaine, loperamide, methocarbamol, prochlorperazine, promethazine, promethazine,  sodium chloride flush  Stark Klein, MD [facility]

## 2018-05-17 NOTE — Telephone Encounter (Signed)
Nutrition  Chart reviewed, noted patient in hospital.  Patient currently on RD's schedule at [facility] at [facility] on 3/30.  Called to cancel appointment with patient as RD only conducting phone visits at this time due to COVID-19 pandemic.  Left message on voicemail.  RD will follow-up via phone at later date.   Grae Cannata B. Zenia Resides, [place], [place] Registered Dietitian [phone] (pager)

## 2018-05-17 NOTE — Progress Notes (Signed)
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 Nausea remains fairly constant. Passed 3 loose brown BMs yesterday, no BM yet today. No blood or melena.   S/P EGD/EUS with Cyst Gastrostomy by Dr. Rush Landmark 05/16/2018:  normal esophagus. 800cc bilious fluid removed from stomach, 2 cratered gastric ulcers with a clean ulcer base were found on the lesser curvature of the stomach, extrinsic compression of the stomach was found on the posterior wall of the gastric body.  A cystic lesion was seen in the peripancreatic region.  The endosonographic appearance is consistent with a pancreatic pseudocyst and with some Walled-Off Necrosis. Dilated AXIOS stent tract. Double pigtail placed.  Objective:  Vital signs in last 24 hours: Temp:  [97.8 F (36.6 C)-99.1 F (37.3 C)] 98.6 F (37 C) (03/27 0530) Pulse Rate:  [88-107] 88 (03/27 0530) Resp:  [14-28] 16 (03/27 0530) BP: (110-124)/(35-60) 124/60 (03/27 0530) SpO2:  [94 %-100 %] 98 % (03/27 0530) Weight:  [54.9 kg] 54.9 kg (03/26 1127) Last BM Date: 05/16/18 General:   Alert, soft spoken, in NAD Heart:  Regular rate and rhythm; no murmurs Pulm: Few crackles in the bases bilaterally. Abdomen: Soft, mild lower abdominal tenderness, epigastric mass protrudes, incision above the umbilicus intact without exudate or erythema, closed.  Extremities:  Without edema. Neurologic:  Alert and  oriented x4;  grossly normal neurologically. Psych:  Alert and cooperative. Normal mood and affect.  Intake/Output from previous day: 03/26 0701 - 03/27 0700 In: 3845.5 [P.O.:240; I.V.:2955.6; NG/GT:350; IV Piggyback:299.9] Out: 0  Intake/Output this shift: No intake/output data recorded.  Lab Results: Recent Labs    05/14/18 2015 05/15/18 0316 05/17/18 0346  WBC 8.5 9.7 13.6*  HGB 9.5* 9.6* 7.3*  HCT 30.1* 31.5* 24.7*  PLT  307 333 267   BMET Recent Labs    05/15/18 1520 05/16/18 0356 05/17/18 0346  NA 137 135 137  K 4.6 4.7 4.1  CL 105 106 112*  CO2 23 21* 18*  GLUCOSE 127* 107* 121*  BUN 41* 39* 42*  CREATININE 3.35* 3.36* 3.01*  CALCIUM 7.7* 7.3* 7.5*   LFT No results for input(s): PROT, ALBUMIN, AST, ALT, ALKPHOS, BILITOT, BILIDIR, IBILI in the last 72 hours. PT/INR Recent Labs    05/14/18 2015  LABPROT 14.3  INR 1.1   Dg Abd 1 View  Result Date: 05/17/2018 CLINICAL DATA:  Nausea.  Cystogastrostomy stent placement EXAM: ABDOMEN - 1 VIEW COMPARISON:  May 11, 2018 FINDINGS: Catheter tip is in the left mid abdomen. There is mild dilatation of the rectum due to fluid and stool. Elsewhere, there is no appreciable bowel dilatation. No air-fluid level. There are surgical clips in the upper abdomen. A drain is noted in the medial left upper abdomen. No free air evident. IMPRESSION: Catheter tip in lateral left mid abdomen region. Drain and left upper quadrant in or overlying the stomach. Rectum distended with fluid and stool. No bowel obstruction or free air demonstrable. Electronically Signed   By: Lowella Grip III M.D.   On: 05/17/2018 08:26    Assessment / Plan:  1. 68 y.o. female with pancreatic adenocarcinoma stage Ib withmetastasis to the liver,pancreatic pseudocysts. A/P CT showed a large pseudocyst along the pancreatic body/tail measuring 7.2 x 12.7 cm, previously 5.0 x 9.8 cm. Additional 4.9 x 6.0 cm complex pseudocyst along the pancreatic tail/splenic hilum,previously 3.5 x 2.9 cm. S/P EGD/EUS with cyst gastrostomy by Dr. Rush Landmark  3/26. WBC 13.6 >> 9.7. Afebrile. Abdominal xray shows catheter tip in lateral left mid abdomen, stool and fluid in rectum. -Cipro 200mg  IV bid, renal dosing confirmed by pharmacist, will continue IV Cipro in setting of elevated WBCs today -follow CBC, Temperature -repeat CBC with diff in am -Scheduled for a repeat EUS/Necrosectomy 05/20/2018 -may try  clear liquids -No PPI to allow acid to enter cyst cavity to aid in debridement -may need enema if no BM today   2. Nausea and vomiting secondary to # 1 - Ondansetron 4mg  IV Q 6hrs. Compazine 10mg  IV PRN. Phenergan 12.5mg  IV PRN.  3. Acute Kidney Injury secondary to dehydration in setting of N/V/D. Patient has been NPO after MN for planned cyst gastrostomy. Cr. 3.1 << 3.36 << 3.55 -IV NS 1000cc stat, then NS 100cc/hr  4. Malnourished on Osmolite TF via J Tube. Albumin 2.4. -continue Osmolite  TF, appreciate RD recommendations -may try clear liquids with HOB up 45 degrees  5. History of LLE DVT 12/2027 off Eliquis. Lovenox dc'd. On Heparin gtt  6.  Macrocytic Anemia ? Chemo + dilutional.  Hg 7.3 <<  8.4. Base line Hg 9.0. Patient is on IV heparin at risk for post procedure bleeding (S/P EGD/EUS/cyst gastrostomy  -repeat CBC at 1600 -monitor for active GI bleeding  Further recommendations per Dr. Tarri Glenn        LOS: 8 days   Cindy Bowman  05/17/2018, 8:39 AM

## 2018-05-17 NOTE — Care Management Important Message (Signed)
Important Message  Patient Details  Name: Alla Sloma MRN: [id number] Date of Birth: Jul 20, 1950   Medicare Important Message Given:  Yes    Kerin Salen 05/17/2018, 10:46 AMImportant Message  Patient Details  Name: Sevyn Markham MRN: [id number] Date of Birth: March 28, 1950   Medicare Important Message Given:  Yes    Kerin Salen 05/17/2018, 10:46 AM

## 2018-05-17 NOTE — Progress Notes (Signed)
Patient ID: Cindy Bowman, female   DOB: 12-Jan-1951, 68 y.o.   MRN: [id number] CBC: WBC 14. Hg 7.3 stable. Transfuse 1 unit of PRBCs  For Hg < 7. Patient with complaints of heartburn.  Dr. Rush Landmark recommending avoiding PPIs. Refer to EUS cyst gastrostomy report. Famotidine 20mg  IV bid. Protonix dc'd.

## 2018-05-17 NOTE — Progress Notes (Signed)
At 1152, Stark Klein, MD was paged regarding the pt's c/o indigestion.

## 2018-05-17 NOTE — Progress Notes (Signed)
ANTICOAGULATION CONSULT NOTE - Follow Up Consult  Pharmacy Consult for Heparin Indication: Hx of LLE DVT  Allergies  Allergen Reactions  . Nsaids Other (See Comments)    Decreased GFR  . Penicillins     Yeast infection Did it involve swelling of the face/tongue/throat, SOB, or low BP? No Did it involve sudden or severe rash/hives, skin peeling, or any reaction on the inside of your mouth or nose? No Did you need to seek medical attention at a hospital or doctor's office? No When did it last happen?Unknown If all above answers are "NO", may proceed with cephalosporin use.      Patient Measurements: Height: 5\' 3"  (160 cm) Weight: 121 lb 0.5 oz (54.9 kg) IBW/kg (Calculated) : 52.4 Heparin Dosing Weight: TBW  Vital Signs: Temp: 98.6 F (37 C) (03/27 0530) Temp Source: Oral (03/27 0530) BP: 124/60 (03/27 0530) Pulse Rate: 88 (03/27 0530)  Labs: Recent Labs    05/14/18 2015  05/15/18 0316 05/15/18 1520 05/15/18 1802 05/16/18 0356 05/17/18 0346  HGB 9.5*  --  9.6*  --   --   --  7.3*  HCT 30.1*  --  31.5*  --   --   --  24.7*  PLT 307  --  333  --   --   --  267  APTT 40*  --   --   --   --   --   --   LABPROT 14.3  --   --   --   --   --   --   INR 1.1  --   --   --   --   --   --   HEPARINUNFRC  --   --   --   --  0.32 0.42  --   CREATININE  --    < > 2.77* 3.35*  --  3.36* 3.01*   < > = values in this interval not displayed.    Estimated Creatinine Clearance: 14.6 mL/min (A) (by C-G formula based on SCr of 3.01 mg/dL (H)).   Medications: Infusions:  . sodium chloride 100 mL/hr at 05/17/18 0600  . sodium chloride Stopped (05/16/18 1325)  . ciprofloxacin Stopped (05/17/18 0214)  . feeding supplement (OSMOLITE 1.2 CAL) 1,000 mL (05/16/18 1923)  . heparin 900 Units/hr (05/17/18 0600)    Assessment: 4 y/oF with PMH of pancreatic adenocarcinoma with metastasis to liver, LLE DVT on apixaban (held prior to admission since 3/16) admitted to [facility] on 3/19 due to  FTT, s/p laparoscopic J-tube placement. Abdominal/pelvic CT showed enlarged pancreatic pseudocysts and MD planning for endoscopic cyst gastrostomy. Pharmacy consulted to transition to IV heparin infusion in anticipation of procedure.  Significant events: 3/16 Apixaban held PTA 3/20-3/23 Lovenox 40 mg daily 3/24 Lovenox 50 mg q12h 3/24: Start IV heparin infusion 3/25: 05:45 IV heparin held 3/26 cystogastrostomy w/ stent placement  - Per MD, Resume Heparin without bolus at 22:00. Tentative plan for necrosectomy on 3/30.  Today, 05/16/2018   Heparin level 0.29, just below therapeutic range on Heparin at 900 units/hr.  SCr improved to 3, CrCl ~ 15 ml/min  CBC: Hgb decreased to 7.3, Plt remain WNL.  No bleeding or complications reported.  GI notes note no blood or melena in stools.  She is high risk for GI bleeding d/t stent placed and 2 cratered gastric ulcers, but no PPI to allow acid debridement of cyst cavity.  Goal of Therapy:  Heparin level 0.3-0.7 units/ml Monitor platelets by anticoagulation  protocol: Yes   Plan:   Increase to heparin IV infusion at 950 units/hr  Heparin level 8 hours after rate change  Daily heparin level and CBC  Continue to monitor H&H and platelets  Follow up plans for holding heparin prior to further procedures, 3/30.   Gretta Arab PharmD, BCPS Pager [phone] 05/17/2018 8:59 AM

## 2018-05-17 NOTE — Plan of Care (Signed)
  Problem: Clinical Measurements: Goal: Will remain free from infection Outcome: Progressing   Problem: Activity: Goal: Risk for activity intolerance will decrease Outcome: Progressing   Problem: Nutrition: Goal: Adequate nutrition will be maintained Outcome: Progressing   Problem: Safety: Goal: Ability to remain free from injury will improve Outcome: Progressing   

## 2018-05-17 NOTE — Progress Notes (Signed)
ANTICOAGULATION CONSULT NOTE - Follow Up Consult  Pharmacy Consult for Heparin Indication: Hx of LLE DVT  Allergies  Allergen Reactions  . Nsaids Other (See Comments)    Decreased GFR  . Penicillins     Yeast infection Did it involve swelling of the face/tongue/throat, SOB, or low BP? No Did it involve sudden or severe rash/hives, skin peeling, or any reaction on the inside of your mouth or nose? No Did you need to seek medical attention at a hospital or doctor's office? No When did it last happen?Unknown If all above answers are "NO", may proceed with cephalosporin use.      Patient Measurements: Height: 5\' 3"  (160 cm) Weight: 121 lb 0.5 oz (54.9 kg) IBW/kg (Calculated) : 52.4 Heparin Dosing Weight: TBW  Vital Signs: Temp: 99.1 F (37.3 C) (03/27 1323) Temp Source: Oral (03/27 1323) BP: 130/48 (03/27 1323) Pulse Rate: 102 (03/27 1323)  Labs: Recent Labs    05/14/18 2015  05/15/18 0316 05/15/18 1520  05/16/18 0356 05/17/18 0346 05/17/18 0908 05/17/18 1439 05/17/18 1758  HGB 9.5*  --  9.6*  --   --   --  7.3*  --  7.3*  --   HCT 30.1*  --  31.5*  --   --   --  24.7*  --  23.3*  --   PLT 307  --  333  --   --   --  267  --  230  --   APTT 40*  --   --   --   --   --   --   --   --   --   LABPROT 14.3  --   --   --   --   --   --   --   --   --   INR 1.1  --   --   --   --   --   --   --   --   --   HEPARINUNFRC  --   --   --   --    < > 0.42  --  0.29*  --  <0.10*  CREATININE  --    < > 2.77* 3.35*  --  3.36* 3.01*  --   --   --    < > = values in this interval not displayed.    Estimated Creatinine Clearance: 14.6 mL/min (A) (by C-G formula based on SCr of 3.01 mg/dL (H)).  Assessment: 36 y/oF with PMH of pancreatic adenocarcinoma with metastasis to liver, LLE DVT on apixaban (held prior to admission since 3/16) admitted to [facility] on 3/19 due to FTT, s/p laparoscopic J-tube placement. Abdominal/pelvic CT showed enlarged pancreatic pseudocysts and MD planning  for endoscopic cyst gastrostomy. Pharmacy consulted to transition to IV heparin infusion in anticipation of procedure.  Significant events: 3/16 Apixaban held PTA 3/20-3/23 Lovenox 40 mg daily 3/24 Lovenox 50 mg q12h 3/24: Start IV heparin infusion 3/25: 05:45 IV heparin held 3/26 cystogastrostomy w/ stent placement  - Per MD, Resume Heparin without bolus at 22:00. Tentative plan for necrosectomy on 3/30.  05/17/2018   Heparin level 0.29, just below therapeutic range on Heparin at 900 units/hr.  SCr improved to 3, CrCl ~ 15 ml/min  CBC: Hgb decreased to 7.3, Plt remain WNL.  No bleeding or complications reported.  GI notes note no blood or melena in stools.  She is high risk for GI bleeding d/t stent placed and 2 cratered gastric ulcers,  but no PPI to allow acid debridement of cyst cavity  3/27 PM note Heparin level at 1800 is undetectable at < 0.1 after heparin rate increased to 950 units/hr this morning at 10 am Per RN: heparin IV line has white crystallized matter in the line.  Line was clamped and flushed.   Goal of Therapy:  Heparin level 0.3-0.7 units/ml Monitor platelets by anticoagulation protocol: Yes   Plan:   Start new IV line and new bag of heparin and new pIV site   Heparin 2000 units IV bolus and resume heparin IV infusion at 950 units/hr  Daily heparin level and CBC  Continue to monitor H&H and platelets  Follow up plans for holding heparin prior to further procedures, 3/30.  Eudelia Bunch, Pharm.D 05/17/2018 8:03 PM

## 2018-05-18 LAB — CBC
HCT: 25.7 % — ABNORMAL LOW (ref 36.0–46.0)
Hemoglobin: 7.7 g/dL — ABNORMAL LOW (ref 12.0–15.0)
MCH: 31.4 pg (ref 26.0–34.0)
MCHC: 30 g/dL (ref 30.0–36.0)
MCV: 104.9 fL — ABNORMAL HIGH (ref 80.0–100.0)
NRBC: 0 % (ref 0.0–0.2)
PLATELETS: 248 10*3/uL (ref 150–400)
RBC: 2.45 MIL/uL — ABNORMAL LOW (ref 3.87–5.11)
RDW: 16.4 % — ABNORMAL HIGH (ref 11.5–15.5)
WBC: 13 10*3/uL — AB (ref 4.0–10.5)

## 2018-05-18 LAB — HEPARIN LEVEL (UNFRACTIONATED)
Heparin Unfractionated: 0.1 IU/mL — ABNORMAL LOW (ref 0.30–0.70)
Heparin Unfractionated: 0.16 IU/mL — ABNORMAL LOW (ref 0.30–0.70)

## 2018-05-18 LAB — GLUCOSE, CAPILLARY
GLUCOSE-CAPILLARY: 131 mg/dL — AB (ref 70–99)
Glucose-Capillary: 106 mg/dL — ABNORMAL HIGH (ref 70–99)
Glucose-Capillary: 117 mg/dL — ABNORMAL HIGH (ref 70–99)
Glucose-Capillary: 120 mg/dL — ABNORMAL HIGH (ref 70–99)
Glucose-Capillary: 120 mg/dL — ABNORMAL HIGH (ref 70–99)

## 2018-05-18 LAB — BASIC METABOLIC PANEL
Anion gap: 8 (ref 5–15)
BUN: 37 mg/dL — ABNORMAL HIGH (ref 8–23)
CO2: 19 mmol/L — ABNORMAL LOW (ref 22–32)
Calcium: 7.6 mg/dL — ABNORMAL LOW (ref 8.9–10.3)
Chloride: 111 mmol/L (ref 98–111)
Creatinine, Ser: 2.52 mg/dL — ABNORMAL HIGH (ref 0.44–1.00)
GFR calc Af Amer: 22 mL/min — ABNORMAL LOW (ref >60)
GFR calc non Af Amer: 19 mL/min — ABNORMAL LOW (ref >60)
Glucose, Bld: 137 mg/dL — ABNORMAL HIGH (ref 70–99)
Potassium: 4.5 mmol/L (ref 3.5–5.1)
SODIUM: 138 mmol/L (ref 135–145)

## 2018-05-18 MED ORDER — HEPARIN BOLUS VIA INFUSION
1500.0000 [IU] | Freq: Once | INTRAVENOUS | Status: AC
  Administered 2018-05-18: 1500 [IU] via INTRAVENOUS
  Filled 2018-05-18: qty 1500

## 2018-05-18 MED ORDER — HEPARIN (PORCINE) 25000 UT/250ML-% IV SOLN
1500.0000 [IU]/h | INTRAVENOUS | Status: AC
  Administered 2018-05-18: 1200 [IU]/h via INTRAVENOUS
  Administered 2018-05-19: 1350 [IU]/h via INTRAVENOUS
  Filled 2018-05-18 (×3): qty 250

## 2018-05-18 MED ORDER — PROCHLORPERAZINE EDISYLATE 10 MG/2ML IJ SOLN
10.0000 mg | Freq: Four times a day (QID) | INTRAMUSCULAR | Status: DC | PRN
  Administered 2018-05-18 – 2018-05-19 (×3): 10 mg via INTRAVENOUS
  Filled 2018-05-18 (×3): qty 2

## 2018-05-18 NOTE — Progress Notes (Signed)
Leodis Sias, Pharmacist, called RN and said the Patient's most recent Heparin level came back as no value. RN checked Heparin tubing, saw that the line had white crystals in it. RN disconnected the tubing from the patient.

## 2018-05-18 NOTE — Progress Notes (Signed)
   05/18/18 0527  MEWS Assessment  Is this an acute change? Yes  MEWS guidelines implemented *See Row Information* Yellow  Rapid Response Notification  Name of Rapid Response RN Notified Jennye Moccasin, RN  Date Rapid Response Notified 05/18/18  Time Rapid Response Notified 0454  Provider Notification  Notification Type Call  Notification Reason Change in status  Response Other (Comment) (RRT RN told RN to give Tylenol and recheck vitals in an hour. If no change Rn will call RRT RN)  Date of Provider Response 05/18/18  Time of Provider Response [phone]

## 2018-05-18 NOTE — Progress Notes (Signed)
RN received a call from the pharmacist regarding the amount of restarts the Heparin drip had in the [facility]. RN explained that the peripheral IV was located in the Left [facility] and continues to become occluded, needing to be restarted.

## 2018-05-18 NOTE — Progress Notes (Signed)
ANTICOAGULATION CONSULT NOTE - Follow Up Consult  Pharmacy Consult for Heparin Indication: Hx of LLE DVT  Allergies  Allergen Reactions  . Nsaids Other (See Comments)    Decreased GFR  . Penicillins     Yeast infection Did it involve swelling of the face/tongue/throat, SOB, or low BP? No Did it involve sudden or severe rash/hives, skin peeling, or any reaction on the inside of your mouth or nose? No Did you need to seek medical attention at a hospital or doctor's office? No When did it last happen?Unknown If all above answers are "NO", may proceed with cephalosporin use.      Patient Measurements: Height: 5\' 3"  (160 cm) Weight: 121 lb 0.5 oz (54.9 kg) IBW/kg (Calculated) : 52.4 Heparin Dosing Weight:   Vital Signs: Temp: 98.5 F (36.9 C) (03/28 0613) Temp Source: Oral (03/28 0613) BP: 132/59 (03/28 0434) Pulse Rate: 110 (03/28 0613)  Labs: Recent Labs    05/16/18 0356  05/17/18 0346 05/17/18 0908 05/17/18 1439 05/17/18 1758 05/18/18 0432 05/18/18 0438  HGB  --    < > 7.3*  --  7.3*  --  7.7*  --   HCT  --   --  24.7*  --  23.3*  --  25.7*  --   PLT  --   --  267  --  230  --  248  --   HEPARINUNFRC 0.42  --   --  0.29*  --  <0.10*  --  0.10*  CREATININE 3.36*  --  3.01*  --   --   --  2.52*  --    < > = values in this interval not displayed.    Estimated Creatinine Clearance: 17.4 mL/min (A) (by C-G formula based on SCr of 2.52 mg/dL (H)).   Medications:  Infusions:  . sodium chloride Stopped (05/18/18 0101)  . sodium chloride Stopped (05/16/18 1325)  . ciprofloxacin 100 mL/hr at 05/18/18 0200  . famotidine (PEPCID) IV Stopped (05/17/18 2318)  . feeding supplement (OSMOLITE 1.2 CAL) 1,000 mL (05/16/18 1923)  . heparin 950 Units/hr (05/18/18 0200)    Assessment: Patient with low heparin level but RN noted IV with patient bending arm and obstructing IV.  RN recording multiple IV restarts with < 84mins of time in total interruption.   Therefore  heparin level could be some unknown degree higher that resulted.   Will still increase heparin, feel that amount of heparin lowering is low, due to superb work of Therapist, sports.  Goal of Therapy:  Heparin level 0.3-0.7 units/ml Monitor platelets by anticoagulation protocol: Yes   Plan:  Increase heparin to 1050 units/hr Recheck level at [address], [place] 05/18/2018,6:26 AM

## 2018-05-18 NOTE — Progress Notes (Signed)
     [facility] Progress Note   Chief Complaint:   Pancreatic cancer pseudocyst  /  liver mets    SUBJECTIVE:    no abdominal pain, some mild nausea today   ASSESSMENT AND PLAN:   1. 68 yo female with pancreatic cancer stage ib / pseudocyst  / walled off necrosis /  liver mets. For EUS / necrosectomy with Dr. Rush Landmark on 05/20/18.  -continue IV cipro, supportive care. -Temp 100.5, WBC improving,   14 >>> 13 -getting J tube feedings  2. Macrocytic anemia. Hgb stable at 7.7  3. LLE DVT, on BT at home, currently getting IV heparin    OBJECTIVE:     Vital signs in last 24 hours: Temp:  [98.5 F (36.9 C)-100.5 F (38.1 C)] 98.5 F (36.9 C) (03/28 2703) Pulse Rate:  [95-119] 100 (03/28 0800) Resp:  [16-20] 16 (03/28 0434) BP: (130-159)/(48-68) 132/59 (03/28 0434) SpO2:  [96 %-100 %] 96 % (03/28 0702) Last BM Date: 05/17/18 General:   Alert, female in NAD EENT:  Normal hearing, non icteric sclera, conjunctive pink.  Heart:  Regular rate and rhythm.  No lower extremity edema   Pulm: Normal respiratory effort, lungs Abdomen:  Soft, nondistended, nontender.  Normal bowel sounds, no masses felt.       Neurologic:  Alert and  oriented x4;  grossly normal neurologically. Psych:  Pleasant, cooperative.  Normal mood and affect.   Intake/Output from previous day: 03/27 0701 - 03/28 0700 In: 3238.2 [P.O.:80; I.V.:2208.3; NG/GT:600; IV Piggyback:349.9] Out: 0  Intake/Output this shift: No intake/output data recorded.  Lab Results: Recent Labs    05/17/18 0346 05/17/18 1439 05/18/18 0432  WBC 13.6* 14.0* 13.0*  HGB 7.3* 7.3* 7.7*  HCT 24.7* 23.3* 25.7*  PLT 267 230 248   BMET Recent Labs    05/16/18 0356 05/17/18 0346 05/18/18 0432  NA 135 137 138  K 4.7 4.1 4.5  CL 106 112* 111  CO2 21* 18* 19*  GLUCOSE 107* 121* 137*  BUN 39* 42* 37*  CREATININE 3.36* 3.01* 2.52*  CALCIUM 7.3* 7.5* 7.6*   LFT No results for input(s): PROT, ALBUMIN,  AST, ALT, ALKPHOS, BILITOT, BILIDIR, IBILI in the last 72 hours. PT/INR No results for input(s): LABPROT, INR in the last 72 hours. Hepatitis Panel No results for input(s): HEPBSAG, HCVAB, HEPAIGM, HEPBIGM in the last 72 hours.  Dg Abd 1 View  Result Date: 05/17/2018 CLINICAL DATA:  Nausea.  Cystogastrostomy stent placement EXAM: ABDOMEN - 1 VIEW COMPARISON:  May 11, 2018 FINDINGS: Catheter tip is in the left mid abdomen. There is mild dilatation of the rectum due to fluid and stool. Elsewhere, there is no appreciable bowel dilatation. No air-fluid level. There are surgical clips in the upper abdomen. A drain is noted in the medial left upper abdomen. No free air evident. IMPRESSION: Catheter tip in lateral left mid abdomen region. Drain and left upper quadrant in or overlying the stomach. Rectum distended with fluid and stool. No bowel obstruction or free air demonstrable. Electronically Signed   By: Lowella Grip III M.D.   On: 05/17/2018 08:26    Active Problems:   Pancreatic cancer metastasized to liver [facility])   Pancreatic necrosis     LOS: 9 days   [name] ,NP 05/18/2018, 11:23 AM

## 2018-05-18 NOTE — Progress Notes (Signed)
RN notified on call physician regarding the crystals in the Heparin tubing.   2027 Dr. Marlou Weddington returned call. RN will continue to monitor the patient, get a new peripheral IV started, switch out IV tubing and discontinue the Left hand IV.

## 2018-05-18 NOTE — Progress Notes (Signed)
RN received a call from the pharmacist. Pharmacist informed RN about rate dose change for the Heparin, due to the patient's low Heparin level.

## 2018-05-18 NOTE — Progress Notes (Signed)
ANTICOAGULATION CONSULT NOTE - Follow Up Consult  Pharmacy Consult for Heparin Indication: Hx of LLE DVT  Allergies  Allergen Reactions  . Nsaids Other (See Comments)    Decreased GFR  . Penicillins     Yeast infection Did it involve swelling of the face/tongue/throat, SOB, or low BP? No Did it involve sudden or severe rash/hives, skin peeling, or any reaction on the inside of your mouth or nose? No Did you need to seek medical attention at a hospital or doctor's office? No When did it last happen?Unknown If all above answers are "NO", may proceed with cephalosporin use.      Patient Measurements: Height: 5\' 3"  (160 cm) Weight: 121 lb 0.5 oz (54.9 kg) IBW/kg (Calculated) : 52.4 Heparin Dosing Weight: TBW  Vital Signs: Temp: 98.5 F (36.9 C) (03/28 1453) Temp Source: Oral (03/28 1453) BP: 126/64 (03/28 1453) Pulse Rate: (P) 100 (03/28 1501)  Labs: Recent Labs    05/16/18 0356  05/17/18 0346  05/17/18 1439 05/17/18 1758 05/18/18 0432 05/18/18 0438 05/18/18 1500  HGB  --    < > 7.3*  --  7.3*  --  7.7*  --   --   HCT  --   --  24.7*  --  23.3*  --  25.7*  --   --   PLT  --   --  267  --  230  --  248  --   --   HEPARINUNFRC 0.42  --   --    < >  --  <0.10*  --  0.10* 0.16*  CREATININE 3.36*  --  3.01*  --   --   --  2.52*  --   --    < > = values in this interval not displayed.    Estimated Creatinine Clearance: 17.4 mL/min (A) (by C-G formula based on SCr of 2.52 mg/dL (H)).  Assessment: 28 y/oF with PMH of pancreatic adenocarcinoma with metastasis to liver, LLE DVT on apixaban (held prior to admission since 3/16) admitted to [facility] on 3/19 due to FTT, s/p laparoscopic J-tube placement. Abdominal/pelvic CT showed enlarged pancreatic pseudocysts and MD planning for endoscopic cyst gastrostomy. Pharmacy consulted to transition to IV heparin infusion in anticipation of procedure.  Significant events: 3/16 Apixaban held PTA 3/20-3/23 Lovenox 40 mg daily 3/24  Lovenox 50 mg q12h 3/24: Start IV heparin infusion 3/25: 05:45 IV heparin held 3/26 cystogastrostomy w/ stent placement  - Per MD, Resume Heparin without bolus at 22:00. Tentative plan for necrosectomy on 3/30.  05/18/2018   Heparin level 0.16 after heparin infusion increased to 1050 units/hr this AM (this AM HL undetectable: see previous note by Lavell Luster, PharmD)    RN reports no interruptions in heparin infusion during her shift nor did RN from previous shift share any IV issues.  SCr improved to 2.52, CrCl ~ 17 ml/min  CBC: Hgb 7.7, Plt remain WNL.  No bleeding or complications reported.  GI notes note no blood or melena in stools.  She is high risk for GI bleeding d/t stent placed and 2 cratered gastric ulcers, but no PPI to allow acid debridement of cyst cavity   Goal of Therapy:  Heparin level 0.3-0.7 units/ml Monitor platelets by anticoagulation protocol: Yes   Plan:   Heparin 1500 units IV bolus x 1 then increase heparin IV infusion to  12000 units/hr  Check heparin level 8 hr after rate increase  Daily heparin level and CBC  Continue to monitor H&H and  platelets  Follow up plans for holding heparin prior to further procedures, 3/30.  Leone Haven, PharmD 05/18/2018 3:52 PM

## 2018-05-18 NOTE — Progress Notes (Signed)
2 Days Post-Op   Subjective/Chief Complaint: Pt with no acute changes   Objective: Vital signs in last 24 hours: Temp:  [98.5 F (36.9 C)-100.5 F (38.1 C)] 98.5 F (36.9 C) (03/28 5056) Pulse Rate:  [95-119] 100 (03/28 0800) Resp:  [16-20] 16 (03/28 0434) BP: (130-159)/(48-68) 132/59 (03/28 0434) SpO2:  [96 %-100 %] 96 % (03/28 0702) Last BM Date: 05/17/18  Intake/Output from previous day: 03/27 0701 - 03/28 0700 In: 3238.2 [P.O.:80; I.V.:2208.3; NG/GT:600; IV Piggyback:349.9] Out: 0  Intake/Output this shift: No intake/output data recorded.  General appearance: alert and cooperative GI: soft, non-tender; bowel sounds normal; no masses,  no organomegaly and Jtube in place  Lab Results:  Recent Labs    05/17/18 1439 05/18/18 0432  WBC 14.0* 13.0*  HGB 7.3* 7.7*  HCT 23.3* 25.7*  PLT 230 248   BMET Recent Labs    05/17/18 0346 05/18/18 0432  NA 137 138  K 4.1 4.5  CL 112* 111  CO2 18* 19*  GLUCOSE 121* 137*  BUN 42* 37*  CREATININE 3.01* 2.52*  CALCIUM 7.5* 7.6*   PT/INR No results for input(s): LABPROT, INR in the last 72 hours. ABG No results for input(s): PHART, HCO3 in the last 72 hours.  Invalid input(s): PCO2, PO2  Studies/Results: Dg Abd 1 View  Result Date: 05/17/2018 CLINICAL DATA:  Nausea.  Cystogastrostomy stent placement EXAM: ABDOMEN - 1 VIEW COMPARISON:  May 11, 2018 FINDINGS: Catheter tip is in the left mid abdomen. There is mild dilatation of the rectum due to fluid and stool. Elsewhere, there is no appreciable bowel dilatation. No air-fluid level. There are surgical clips in the upper abdomen. A drain is noted in the medial left upper abdomen. No free air evident. IMPRESSION: Catheter tip in lateral left mid abdomen region. Drain and left upper quadrant in or overlying the stomach. Rectum distended with fluid and stool. No bowel obstruction or free air demonstrable. Electronically Signed   By: Lowella Grip III M.D.   On: 05/17/2018  08:26    Anti-infectives: Anti-infectives (From admission, onward)   Start     Dose/Rate Route Frequency Ordered Stop   05/16/18 2355  ciprofloxacin (CIPRO) IVPB 200 mg     200 mg 100 mL/hr over 60 Minutes Intravenous Every 12 hours 05/16/18 1511     05/09/18 2000  ciprofloxacin (CIPRO) IVPB 400 mg     400 mg 200 mL/hr over 60 Minutes Intravenous Every 12 hours 05/09/18 1136 05/09/18 2101   05/09/18 0600  ciprofloxacin (CIPRO) IVPB 400 mg     400 mg 200 mL/hr over 60 Minutes Intravenous On call to O.R. 05/09/18 0546 05/09/18 0810      Assessment/Plan: s/p Procedure(s) with comments: ESOPHAGOGASTRODUODENOSCOPY (EGD) WITH PROPOFOL (N/A) UPPER ESOPHAGEAL ENDOSCOPIC ULTRASOUND (EUS) (N/A) AXIOS STENT PLACEMENT (N/A) - Hobbs Double Pigtail stent placed Balloon Dialtion of Cyst opening Cautery used to open cyst wall BIOPSY  Pt with some nausea but no emesis this AM. Con't Jtube feeds Mobilize   LOS: 9 days    Ralene Ok 05/18/2018

## 2018-05-19 ENCOUNTER — Encounter (HOSPITAL_COMMUNITY): Payer: Self-pay | Admitting: Anesthesiology

## 2018-05-19 ENCOUNTER — Inpatient Hospital Stay (HOSPITAL_COMMUNITY): Payer: Medicare Other

## 2018-05-19 LAB — GLUCOSE, CAPILLARY
Glucose-Capillary: 104 mg/dL — ABNORMAL HIGH (ref 70–99)
Glucose-Capillary: 111 mg/dL — ABNORMAL HIGH (ref 70–99)
Glucose-Capillary: 131 mg/dL — ABNORMAL HIGH (ref 70–99)
Glucose-Capillary: 134 mg/dL — ABNORMAL HIGH (ref 70–99)
Glucose-Capillary: 77 mg/dL (ref 70–99)
Glucose-Capillary: 79 mg/dL (ref 70–99)

## 2018-05-19 LAB — BASIC METABOLIC PANEL
Anion gap: 6 (ref 5–15)
BUN: 35 mg/dL — AB (ref 8–23)
CO2: 18 mmol/L — ABNORMAL LOW (ref 22–32)
Calcium: 7.4 mg/dL — ABNORMAL LOW (ref 8.9–10.3)
Chloride: 113 mmol/L — ABNORMAL HIGH (ref 98–111)
Creatinine, Ser: 2.21 mg/dL — ABNORMAL HIGH (ref 0.44–1.00)
GFR calc Af Amer: 26 mL/min — ABNORMAL LOW (ref >60)
GFR calc non Af Amer: 22 mL/min — ABNORMAL LOW (ref >60)
Glucose, Bld: 134 mg/dL — ABNORMAL HIGH (ref 70–99)
Potassium: 4.2 mmol/L (ref 3.5–5.1)
Sodium: 137 mmol/L (ref 135–145)

## 2018-05-19 LAB — CBC
HCT: 23.1 % — ABNORMAL LOW (ref 36.0–46.0)
HEMOGLOBIN: 7.2 g/dL — AB (ref 12.0–15.0)
MCH: 32 pg (ref 26.0–34.0)
MCHC: 31.2 g/dL (ref 30.0–36.0)
MCV: 102.7 fL — ABNORMAL HIGH (ref 80.0–100.0)
Platelets: 230 10*3/uL (ref 150–400)
RBC: 2.25 MIL/uL — ABNORMAL LOW (ref 3.87–5.11)
RDW: 16.6 % — ABNORMAL HIGH (ref 11.5–15.5)
WBC: 11.7 10*3/uL — ABNORMAL HIGH (ref 4.0–10.5)
nRBC: 0 % (ref 0.0–0.2)

## 2018-05-19 LAB — HEPARIN LEVEL (UNFRACTIONATED)
Heparin Unfractionated: 0.17 IU/mL — ABNORMAL LOW (ref 0.30–0.70)
Heparin Unfractionated: <0.1 IU/mL — ABNORMAL LOW (ref 0.30–0.70)
Heparin Unfractionated: <0.1 IU/mL — ABNORMAL LOW (ref 0.30–0.70)

## 2018-05-19 MED ORDER — HEPARIN BOLUS VIA INFUSION
1500.0000 [IU] | Freq: Once | INTRAVENOUS | Status: AC
  Administered 2018-05-19: 1500 [IU] via INTRAVENOUS
  Filled 2018-05-19: qty 1500

## 2018-05-19 NOTE — Progress Notes (Signed)
ANTICOAGULATION CONSULT NOTE - Follow Up Consult  Pharmacy Consult for Heparin Indication: Hx of LLE DVT  Allergies  Allergen Reactions  . Nsaids Other (See Comments)    Decreased GFR  . Penicillins     Yeast infection Did it involve swelling of the face/tongue/throat, SOB, or low BP? No Did it involve sudden or severe rash/hives, skin peeling, or any reaction on the inside of your mouth or nose? No Did you need to seek medical attention at a hospital or doctor's office? No When did it last happen?Unknown If all above answers are "NO", may proceed with cephalosporin use.      Patient Measurements: Height: 5\' 3"  (160 cm) Weight: 123 lb 14.4 oz (56.2 kg) IBW/kg (Calculated) : 52.4 Heparin Dosing Weight:   Vital Signs: Temp: 97.8 F (36.6 C) (03/29 2120) Temp Source: Oral (03/29 2120) BP: 128/47 (03/29 2120) Pulse Rate: 104 (03/29 2120)  Labs: Recent Labs    05/17/18 0346  05/17/18 1439  05/18/18 0432  05/19/18 0030 05/19/18 0705 05/19/18 1600 05/19/18 2106  HGB 7.3*  --  7.3*  --  7.7*  --  7.2*  --   --   --   HCT 24.7*  --  23.3*  --  25.7*  --  23.1*  --   --   --   PLT 267  --  230  --  248  --  230  --   --   --   HEPARINUNFRC  --    < >  --    < >  --    < >  --  0.17* <0.10* <0.10*  CREATININE 3.01*  --   --   --  2.52*  --  2.21*  --   --   --    < > = values in this interval not displayed.    Estimated Creatinine Clearance: 19.9 mL/min (A) (by C-G formula based on SCr of 2.21 mg/dL (H)).   Medications:  Infusions:  . sodium chloride 100 mL/hr at 05/19/18 1911  . sodium chloride Stopped (05/16/18 1325)  . ciprofloxacin Stopped (05/19/18 1352)  . famotidine (PEPCID) IV 20 mg (05/19/18 2121)  . heparin Stopped (05/19/18 1343)    Assessment: Patient with low heparin level.  No heparin issues per RN, since RN caring for patient.    Goal of Therapy:  Heparin level 0.3-0.7 units/ml Monitor platelets by anticoagulation protocol: Yes   Plan:   Increase heparin to 1500 units/hr Heparin to turned off at 0200 per prior orders  Tyler Deis, Shea [place] 05/19/2018,10:27 PM

## 2018-05-19 NOTE — Progress Notes (Signed)
Dr. Marlou Ewy aware via phone that pt's NV ongoing with intermittent relief following antiemetics yet unable to tolerated po. Medications clarified with MD. Dr Marlou Fullard aware K+ 4.2 today, VSS, and pt denies pain. See new order received to dc scheduled po medications. Pt currently resting quietly.

## 2018-05-19 NOTE — Anesthesia Preprocedure Evaluation (Deleted)
Anesthesia Evaluation    Reviewed: Allergy & Precautions, NPO status , Patient's Chart, lab work & pertinent test results  History of Anesthesia Complications (+) PONV, DIFFICULT AIRWAY and history of anesthetic complications  Airway        Dental  (+) Dental Advisory Given, Chipped   Pulmonary neg pulmonary ROS,           Cardiovascular hypertension, Pt. on medications + DVT       Neuro/Psych negative neurological ROS     GI/Hepatic negative GI ROS, PANCREATIC CANCER SEVERE PROTEIN CALORIE MALNUTRITION   Endo/Other  negative endocrine ROS  Renal/GU Renal InsufficiencyRenal disease     Musculoskeletal negative musculoskeletal ROS (+)   Abdominal   Peds  Hematology  (+) Blood dyscrasia (Eliquis), anemia ,   Anesthesia Other Findings   Reproductive/Obstetrics                             Anesthesia Physical  Anesthesia Plan  ASA: III  Anesthesia Plan: General   Post-op Pain Management:    Induction: Intravenous  PONV Risk Score and Plan: 4 or greater and Ondansetron, Dexamethasone and Treatment may vary due to age or medical condition  Airway Management Planned: Oral ETT  Additional Equipment: None  Intra-op Plan:   Post-operative Plan: Extubation in OR  Informed Consent:   Plan Discussed with:   Anesthesia Plan Comments:        Anesthesia Quick Evaluation

## 2018-05-19 NOTE — Progress Notes (Signed)
     [facility] Progress Note   Chief Complaint:   Pancreatic cancer / walled off necrosis   SUBJECTIVE:    Several episodes of vomiting this am. Greenish emesis, she doesn't really know if vomiting enteral feeds   ASSESSMENT AND PLAN:   1. 68 yo female with pancreatic cancer stage ib / pseudocyst  / walled off necrosis /  liver mets. For EUS / necrosectomy with Dr. Rush Landmark on 05/20/18.  -continue IV cipro, supportive care. -Tmax 99.3, WBC continues to improve 13 >>> 11.7 -given persistent vomiting will hold TF for now.   2. Macrocytic anemia. Hgb overall stable at 7.2  3. LLE DVT, on BT at home, currently getting IV heparin -will determine timing of EUS and place order to hold heparin prior to procedure.     OBJECTIVE:     Vital signs in last 24 hours: Temp:  [98.5 F (36.9 C)-99.3 F (37.4 C)] 99.3 F (37.4 C) (03/29 0711) Pulse Rate:  [93-110] 110 (03/29 0711) Resp:  [12-16] 16 (03/29 0711) BP: (121-131)/(44-64) 131/44 (03/29 0711) SpO2:  [98 %-100 %] 100 % (03/29 0711) Weight:  [56.2 kg] 56.2 kg (03/29 0500) Last BM Date: 05/17/18 General:   Alert, female in NAD EENT:  Normal hearing  Heart:  Regular rate and rhythm; no murmur.  No lower extremity edema   Pulm: Normal respiratory effort Abdomen:  Soft, nondistended, nontender.  A few bowel sounds     Neurologic:  Alert and  oriented x4;  grossly normal neurologically. Psych:  Pleasant, cooperative.  Normal mood and affect.   Intake/Output from previous day: 03/28 0701 - 03/29 0700 In: 2399.2 [P.O.:30; I.V.:1619.2; NG/GT:600; IV Piggyback:150] Out: 0  Intake/Output this shift: No intake/output data recorded.  Lab Results: Recent Labs    05/17/18 1439 05/18/18 0432 05/19/18 0030  WBC 14.0* 13.0* 11.7*  HGB 7.3* 7.7* 7.2*  HCT 23.3* 25.7* 23.1*  PLT 230 248 230   BMET Recent Labs    05/17/18 0346 05/18/18 0432 05/19/18 0030  NA 137 138 137  K 4.1 4.5 4.2  CL 112* 111 113*   CO2 18* 19* 18*  GLUCOSE 121* 137* 134*  BUN 42* 37* 35*  CREATININE 3.01* 2.52* 2.21*  CALCIUM 7.5* 7.6* 7.4*      Active Problems:   Pancreatic cancer metastasized to liver ([facility])   Pancreatic necrosis     LOS: 10 days   [name] ,NP 05/19/2018, 10:07 AM

## 2018-05-19 NOTE — Progress Notes (Signed)
3 Days Post-Op   Subjective/Chief Complaint: Nauseated and vomiting this am   Objective: Vital signs in last 24 hours: Temp:  [98.5 F (36.9 C)-99.3 F (37.4 C)] 99.3 F (37.4 C) (03/29 0711) Pulse Rate:  [93-110] 110 (03/29 0711) Resp:  [12-16] 16 (03/29 0711) BP: (121-131)/(44-64) 131/44 (03/29 0711) SpO2:  [98 %-100 %] 100 % (03/29 0711) Weight:  [56.2 kg] 56.2 kg (03/29 0500) Last BM Date: 05/17/18  Intake/Output from previous day: 03/28 0701 - 03/29 0700 In: 2399.2 [P.O.:30; I.V.:1619.2; NG/GT:600; IV Piggyback:150] Out: 0  Intake/Output this shift: No intake/output data recorded.  General appearance: alert and cooperative Resp: clear to auscultation bilaterally Cardio: regular rate and rhythm GI: moderate tenderness. j tube in place  Lab Results:  Recent Labs    05/18/18 0432 05/19/18 0030  WBC 13.0* 11.7*  HGB 7.7* 7.2*  HCT 25.7* 23.1*  PLT 248 230   BMET Recent Labs    05/18/18 0432 05/19/18 0030  NA 138 137  K 4.5 4.2  CL 111 113*  CO2 19* 18*  GLUCOSE 137* 134*  BUN 37* 35*  CREATININE 2.52* 2.21*  CALCIUM 7.6* 7.4*   PT/INR No results for input(s): LABPROT, INR in the last 72 hours. ABG No results for input(s): PHART, HCO3 in the last 72 hours.  Invalid input(s): PCO2, PO2  Studies/Results: No results found.  Anti-infectives: Anti-infectives (From admission, onward)   Start     Dose/Rate Route Frequency Ordered Stop   05/16/18 2355  ciprofloxacin (CIPRO) IVPB 200 mg     200 mg 100 mL/hr over 60 Minutes Intravenous Every 12 hours 05/16/18 1511     05/09/18 2000  ciprofloxacin (CIPRO) IVPB 400 mg     400 mg 200 mL/hr over 60 Minutes Intravenous Every 12 hours 05/09/18 1136 05/09/18 2101   05/09/18 0600  ciprofloxacin (CIPRO) IVPB 400 mg     400 mg 200 mL/hr over 60 Minutes Intravenous On call to O.R. 05/09/18 0546 05/09/18 0810      Assessment/Plan: s/p Procedure(s) with comments: ESOPHAGOGASTRODUODENOSCOPY (EGD) WITH  PROPOFOL (N/A) UPPER ESOPHAGEAL ENDOSCOPIC ULTRASOUND (EUS) (N/A) AXIOS STENT PLACEMENT (N/A) - Hobbs Double Pigtail stent placed Balloon Dialtion of Cyst opening Cautery used to open cyst wall BIOPSY will treat nausea. check abd xray for vomiting  Continue IV cipro AKI improving OOB as tolerated  LOS: 10 days    Cindy Bowman 05/19/2018

## 2018-05-19 NOTE — Progress Notes (Signed)
PHARMACY - HEPARIN (brief note)  Heparin level < 0.1 Spoke with RN who reported earlier IV site infiltration and interruption of heparin infusion for approx 1 hr.  RN reports that new IV site in a good location.  Heparin has been infusing @ 1350 units/hr without interruption since restarted.  PLAN: Continue IV heparin @ 1350 units/hr for now Will recheck heparin level @ 19:30  Leone Haven, PharmD

## 2018-05-19 NOTE — Progress Notes (Signed)
ANTICOAGULATION CONSULT NOTE - Follow Up Consult  Pharmacy Consult for Heparin Indication: Hx of LLE DVT  Allergies  Allergen Reactions  . Nsaids Other (See Comments)    Decreased GFR  . Penicillins     Yeast infection Did it involve swelling of the face/tongue/throat, SOB, or low BP? No Did it involve sudden or severe rash/hives, skin peeling, or any reaction on the inside of your mouth or nose? No Did you need to seek medical attention at a hospital or doctor's office? No When did it last happen?Unknown If all above answers are "NO", may proceed with cephalosporin use.      Patient Measurements: Height: 5\' 3"  (160 cm) Weight: 123 lb 14.4 oz (56.2 kg) IBW/kg (Calculated) : 52.4 Heparin Dosing Weight: TBW  Vital Signs: Temp: 99.3 F (37.4 C) (03/29 0711) Temp Source: Oral (03/29 0711) BP: 131/44 (03/29 0711) Pulse Rate: 110 (03/29 0711)  Labs: Recent Labs    05/17/18 0346  05/17/18 1439  05/18/18 0432 05/18/18 0438 05/18/18 1500 05/19/18 0030 05/19/18 0705  HGB 7.3*  --  7.3*  --  7.7*  --   --  7.2*  --   HCT 24.7*  --  23.3*  --  25.7*  --   --  23.1*  --   PLT 267  --  230  --  248  --   --  230  --   HEPARINUNFRC  --    < >  --    < >  --  0.10* 0.16*  --  0.17*  CREATININE 3.01*  --   --   --  2.52*  --   --  2.21*  --    < > = values in this interval not displayed.    Estimated Creatinine Clearance: 19.9 mL/min (A) (by C-G formula based on SCr of 2.21 mg/dL (H)).  Assessment: 30 y/oF with PMH of pancreatic adenocarcinoma with metastasis to liver, LLE DVT on apixaban (held prior to admission since 3/16) admitted to [facility] on 3/19 due to FTT, s/p laparoscopic J-tube placement. Abdominal/pelvic CT showed enlarged pancreatic pseudocysts and MD planning for endoscopic cyst gastrostomy. Pharmacy consulted to transition to IV heparin infusion in anticipation of procedure.  Significant events: 3/16 Apixaban held PTA 3/20-3/23 Lovenox 40 mg daily 3/24 Lovenox  50 mg q12h 3/24: Start IV heparin infusion 3/25: 05:45 IV heparin held 3/26 cystogastrostomy w/ stent placement  - Per MD, Resume Heparin without bolus at 22:00. Tentative plan for necrosectomy on 3/30.  05/19/2018   Heparin level 0.17 after heparin bolus and infusion increased to 1200 units/hr last night   Discussed with night RN - reports no interruptions in heparin infusion during her shift nor did RN from previous shift share any IV issues.  SCr improved to 2.51, CrCl ~ 20 ml/min  CBC: Hgb 7.2, Plt remain WNL.  No bleeding or complications per RN.  GI notes note no blood or melena in stools.  She is high risk for GI bleeding d/t stent placed and 2 cratered gastric ulcers, but no PPI to allow acid debridement of cyst cavity  Goal of Therapy:  Heparin level 0.3-0.7 units/ml Monitor platelets by anticoagulation protocol: Yes   Plan:   Heparin 1500 units IV bolus x 1 then increase heparin IV infusion to  1350 units/hr  Check heparin level 8 hr after rate increase  Daily heparin level and CBC  Continue to monitor H&H and platelets  Follow up plans for holding heparin prior to further  procedures, 3/30.  Peggyann Juba, PharmD, BCPS Pager: [phone] 05/19/2018 7:43 AM

## 2018-05-19 NOTE — Plan of Care (Signed)
  Problem: Nutrition: Goal: Adequate nutrition will be maintained Outcome: Progressing   Problem: Health Behavior/Discharge Planning: Goal: Ability to manage health-related needs will improve Outcome: Progressing   Problem: Clinical Measurements: Goal: Ability to maintain clinical measurements within normal limits will improve Outcome: Progressing   Problem: Clinical Measurements: Goal: Will remain free from infection Outcome: Progressing   Problem: Clinical Measurements: Goal: Diagnostic test results will improve Outcome: Progressing

## 2018-05-20 ENCOUNTER — Encounter: Payer: Self-pay | Admitting: Gastroenterology

## 2018-05-20 ENCOUNTER — Inpatient Hospital Stay (HOSPITAL_COMMUNITY): Payer: Medicare Other

## 2018-05-20 ENCOUNTER — Inpatient Hospital Stay: Payer: Medicare Other

## 2018-05-20 LAB — HEPARIN LEVEL (UNFRACTIONATED)
Heparin Unfractionated: <0.1 IU/mL — ABNORMAL LOW (ref 0.30–0.70)
Heparin Unfractionated: 0.49 IU/mL (ref 0.30–0.70)

## 2018-05-20 LAB — BASIC METABOLIC PANEL
Anion gap: 5 (ref 5–15)
BUN: 31 mg/dL — ABNORMAL HIGH (ref 8–23)
CO2: 18 mmol/L — ABNORMAL LOW (ref 22–32)
Calcium: 7.4 mg/dL — ABNORMAL LOW (ref 8.9–10.3)
Chloride: 117 mmol/L — ABNORMAL HIGH (ref 98–111)
Creatinine, Ser: 1.94 mg/dL — ABNORMAL HIGH (ref 0.44–1.00)
GFR calc Af Amer: 30 mL/min — ABNORMAL LOW (ref >60)
GFR, EST NON AFRICAN AMERICAN: 26 mL/min — AB (ref >60)
Glucose, Bld: 80 mg/dL (ref 70–99)
Potassium: 4.4 mmol/L (ref 3.5–5.1)
Sodium: 140 mmol/L (ref 135–145)

## 2018-05-20 LAB — CBC
HCT: 22.4 % — ABNORMAL LOW (ref 36.0–46.0)
Hemoglobin: 6.7 g/dL — CL (ref 12.0–15.0)
MCH: 31.2 pg (ref 26.0–34.0)
MCHC: 29.9 g/dL — ABNORMAL LOW (ref 30.0–36.0)
MCV: 104.2 fL — ABNORMAL HIGH (ref 80.0–100.0)
Platelets: 244 10*3/uL (ref 150–400)
RBC: 2.15 MIL/uL — AB (ref 3.87–5.11)
RDW: 16.6 % — ABNORMAL HIGH (ref 11.5–15.5)
WBC: 9.1 10*3/uL (ref 4.0–10.5)
nRBC: 0 % (ref 0.0–0.2)

## 2018-05-20 LAB — GLUCOSE, CAPILLARY
GLUCOSE-CAPILLARY: 85 mg/dL (ref 70–99)
GLUCOSE-CAPILLARY: 117 mg/dL — AB (ref 70–99)
Glucose-Capillary: 176 mg/dL — ABNORMAL HIGH (ref 70–99)
Glucose-Capillary: 64 mg/dL — ABNORMAL LOW (ref 70–99)
Glucose-Capillary: 72 mg/dL (ref 70–99)
Glucose-Capillary: 80 mg/dL (ref 70–99)
Glucose-Capillary: 87 mg/dL (ref 70–99)

## 2018-05-20 MED ORDER — OSMOLITE 1.2 CAL PO LIQD
1000.0000 mL | ORAL | Status: DC
  Administered 2018-05-20: 1000 mL
  Filled 2018-05-20: qty 1000

## 2018-05-20 MED ORDER — SODIUM CHLORIDE 0.9% IV SOLUTION: Freq: Once | INTRAVENOUS | Status: DC

## 2018-05-20 MED ORDER — PROPOFOL 10 MG/ML IV BOLUS
INTRAVENOUS | Status: AC
  Filled 2018-05-20: qty 20

## 2018-05-20 MED ORDER — DEXTROSE 50 % IV SOLN
INTRAVENOUS | Status: AC
  Administered 2018-05-20: 50 mL
  Filled 2018-05-20: qty 50

## 2018-05-20 MED ORDER — OSMOLITE 1.2 CAL PO LIQD
1000.0000 mL | ORAL | Status: DC
  Filled 2018-05-20: qty 1000

## 2018-05-20 MED ORDER — KCL IN DEXTROSE-NACL 10-5-0.45 MEQ/L-%-% IV SOLN
INTRAVENOUS | Status: DC
  Administered 2018-05-20 – 2018-05-21 (×3): via INTRAVENOUS
  Filled 2018-05-20 (×3): qty 1000

## 2018-05-20 MED ORDER — FENTANYL CITRATE (PF) 100 MCG/2ML IJ SOLN
INTRAMUSCULAR | Status: AC
  Filled 2018-05-20: qty 2

## 2018-05-20 MED ORDER — DEXTROSE 50 % IV SOLN
25.0000 g | Freq: Once | INTRAVENOUS | Status: AC
  Administered 2018-05-20: 25 g via INTRAVENOUS

## 2018-05-20 MED ORDER — IOHEXOL 300 MG/ML  SOLN
15.0000 mL | Freq: Once | INTRAMUSCULAR | Status: AC | PRN
  Administered 2018-05-20: 15 mL via ORAL
  Filled 2018-05-20: qty 20

## 2018-05-20 MED ORDER — HEPARIN (PORCINE) 25000 UT/250ML-% IV SOLN
1500.0000 [IU]/h | INTRAVENOUS | Status: AC
  Administered 2018-05-20 – 2018-05-22 (×3): 1500 [IU]/h via INTRAVENOUS
  Filled 2018-05-20 (×4): qty 250

## 2018-05-20 NOTE — Progress Notes (Signed)
ANTICOAGULATION CONSULT NOTE - Follow Up Consult  Pharmacy Consult for Heparin Indication: Hx of LLE DVT  Allergies  Allergen Reactions  . Nsaids Other (See Comments)    Decreased GFR  . Penicillins     Yeast infection Did it involve swelling of the face/tongue/throat, SOB, or low BP? No Did it involve sudden or severe rash/hives, skin peeling, or any reaction on the inside of your mouth or nose? No Did you need to seek medical attention at a hospital or doctor's office? No When did it last happen?Unknown If all above answers are "NO", may proceed with cephalosporin use.      Patient Measurements: Height: 5\' 3"  (160 cm) Weight: 132 lb 7.9 oz (60.1 kg) IBW/kg (Calculated) : 52.4 Heparin Dosing Weight: TBW  Vital Signs: Temp: 97.8 F (36.6 C) (03/30 2103) Temp Source: Oral (03/30 2103) BP: 132/64 (03/30 2103) Pulse Rate: 91 (03/30 2103)  Labs: Recent Labs    05/18/18 0432  05/19/18 0030  05/19/18 2106 05/20/18 0414 05/20/18 2227  HGB 7.7*  --  7.2*  --   --  6.7*  --   HCT 25.7*  --  23.1*  --   --  22.4*  --   PLT 248  --  230  --   --  244  --   HEPARINUNFRC  --    < >  --    < > <0.10* <0.10* 0.49  CREATININE 2.52*  --  2.21*  --   --  1.94*  --    < > = values in this interval not displayed.    Estimated Creatinine Clearance: 22.6 mL/min (A) (by C-G formula based on SCr of 1.94 mg/dL (H)).  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 on 3/19 due to FTT, s/p laparoscopic J-tube placement. Abdominal/pelvic CT showed enlarged pancreatic pseudocysts and MD planning for endoscopic cyst gastrostomy. Pharmacy consulted to transition to IV heparin infusion in anticipation of procedure.  Significant events: 3/16 Apixaban held PTA 3/20-3/23 Lovenox 40 mg daily 3/24 Lovenox 50 mg q12h 3/24: Start IV heparin infusion 3/25: 05:45 IV heparin held 3/26  cystogastrostomy w/ stent placement  - Per MD, Resume Heparin without bolus at 22:00. Tentative plan for necrosectomy on 3/30. 3/30: heparin dip off at 0200 in anticipation for EGD/necrosectomy procedure today. With drop in hgb and pt's request to delay procedure, plan is to postpone procedure until 05/22/18 (to be done outpatient). Per GI, resume heparin drip back for now since procedure is postponed 2227 HL = 0.49 at goal, no bleeding or interuptions per RN.   Today, 05/20/2018: - heparin drip off at 0200 this morning - will resume heparin drip now (~11a) since GI procedure is postponed - Hgb down 6.7 (GI team suspects dilutional), plts stable; plan to transfuse 1 unit PRBC today - no bleeding documented - She is high risk for GI bleeding d/t stent placed and 2 cratered gastric ulcers, but no PPI to allow acid debridement of cyst cavity - scr trending down  Goal of Therapy:  Heparin level 0.3-0.7 units/ml Monitor platelets by anticoagulation protocol: Yes   Plan:  - continue heparin drip at 1500 units/hr - recheck HL with am labs to confirm - monitor for s/s bleeding  Dorrene German 05/20/2018 11:07 PM

## 2018-05-20 NOTE — Progress Notes (Signed)
Gastroenterology Inpatient Follow-up Note   PATIENT IDENTIFICATION  Cindy Bowman is a 68 y.o. female with a pmh significant for metastatic pancreatic cancer, recent pancreatitis status post cyst gastrostomy creation with findings of walled off necrosis, hypertension, urinary tract infections.  The patient remains hospitalized in the setting of persistent nausea/vomiting even after cyst gastrostomy pending possible necrosectomy. Hospital Day: 12  SUBJECTIVE  The patient is evaluated this morning and she has not had any nausea or vomiting. Over the weekend she did have significant issues of nausea with vomiting and had a KUB that suggested the stomach had some air-fluid levels concerning for possible recurrent issues of obstruction. Her white count has normalized and her hemoglobin has down trended slightly though I suspect this is mostly delusional as a result of the significant fluids she is received over the course of the last few days. She describes no melena or hematochezia. The patient feels that her abdominal pain is no longer present since the cyst gastrostomy was placed. The patient has not trialed any liquid or solid intake as of yet. I ordered blood at 640 this morning and it is still pending as result of the patient needing a type and screen/type and cross.   OBJECTIVE  Scheduled Inpatient Medications:   sodium chloride   Intravenous Once   fentaNYL  1 patch Transdermal Q72H   insulin aspart  0-9 Units Subcutaneous Q4H   Continuous Inpatient Infusions:   sodium chloride Stopped (05/20/18 0021)   sodium chloride Stopped (05/16/18 1325)   ciprofloxacin Stopped (05/20/18 0121)   famotidine (PEPCID) IV 20 mg (05/19/18 2121)   PRN Inpatient Medications: acetaminophen **OR** acetaminophen, alum & mag hydroxide-simeth, diphenhydrAMINE **OR** diphenhydrAMINE, hydrALAZINE, HYDROmorphone (DILAUDID) injection, lidocaine-prilocaine, loperamide, methocarbamol, prochlorperazine,  promethazine, promethazine, sodium chloride flush   Physical Examination  Temp:  [97.6 F (36.4 C)-98.6 F (37 C)] 98.4 F (36.9 C) (03/30 0935) Pulse Rate:  [84-104] 91 (03/30 0935) Resp:  [14-16] 16 (03/30 0935) BP: (104-128)/(47-68) 104/55 (03/30 0935) SpO2:  [100 %] 100 % (03/30 0935) Weight:  [60.1 kg] 60.1 kg (03/30 0518) Temp (24hrs), Avg:98.1 F (36.7 C), Min:97.6 F (36.4 C), Max:98.6 F (37 C)  Weight: 60.1 kg GEN: NAD, resting comfortably in bed and, still awaiting blood PSYCH: Cooperative, without pressured speech EYE: Conjunctivae pink, sclerae anicteric ENT: MMM CV: Without R/Gs  RESP: No wheezing appreciated GI: NABS, soft, mildly protuberant, NT, without rebound or guarding MSK/EXT: Trace bilateral lower extremity edema present SKIN: No jaundice NEURO:  Alert & Oriented x 3, no focal deficits   Review of Data   Laboratory Studies   Recent Labs  Lab 05/15/18 0316  05/20/18 0414  NA 136   < > 140  K 4.6   < > 4.4  CL 100   < > 117*  CO2 26   < > 18*  BUN 37*   < > 31*  CREATININE 2.77*   < > 1.94*  GLUCOSE 115*   < > 80  CALCIUM 8.3*   < > 7.4*  MG 2.8*  --   --   PHOS 5.4*  --   --    < > = values in this interval not displayed.   No results for input(s): AST, ALT, GGT, ALKPHOS in the last 168 hours.  Invalid input(s): TBILI, CONJBILI, ALB  Recent Labs  Lab 05/18/18 0432 05/19/18 0030 05/20/18 0414  WBC 13.0* 11.7* 9.1  HGB 7.7* 7.2* 6.7*  HCT 25.7* 23.1* 22.4*  PLT 248 230  [place]  Lab 05/14/18 2015  APTT 40*  INR 1.1   GI Studies  No new relevant studies to review  Imaging Studies  Dg Abd 2 Views  Result Date: 05/19/2018 CLINICAL DATA:  Pancreatic carcinoma, nausea and vomiting. EXAM: ABDOMEN - 2 VIEW COMPARISON:  05/17/2018 FINDINGS: The Axios stent cyst gastrostomy with double pigtail catheter appears stable in position. There is an elongated air-fluid level in the stomach. Jejunostomy tube shows stable  positioning. Bowel-gas shows mild prominence of a few small bowel loops which may be consistent with a component of ileus/enteritis. No overt small bowel obstruction identified. No evidence of free intraperitoneal air. IMPRESSION: 1. Stable positioning of cyst gastrostomy stent with traversing double pigtail catheter. There is an elongated air-fluid level in the stomach likely related to fluid distention of the stomach. 2. Mild gaseous prominence of a few small bowel loops which could be consistent with a component of ileus/enteritis. No overt small bowel obstruction. 3. Stable positioning of jejunostomy tube. Electronically Signed   By: Aletta Edouard M.D.   On: 05/19/2018 11:50    ASSESSMENT  Cindy Bowman is a 68 y.o. female with a pmh significant for metastatic pancreatic cancer, recent pancreatitis status post cyst gastrostomy creation with findings of walled off necrosis, hypertension, urinary tract infections.  The patient remains hospitalized in the setting of persistent nausea/vomiting even after cyst gastrostomy pending possible necrosectomy.  The patient is hemodynamically and clinically stabilizing at this point in time.  I have plan to offer the patient a endoscopic necrosectomy today, however due to her decreased hemoglobin as well as the patient's own indications she would not like to pursue this today.  I am still concerned as to why she continues to have persistent symptoms of outlet obstruction and thus the consideration or query of whether she may be having issues as a result of delayed gastric emptying from her pancreatic tumor do seem reasonable to consider especially if the cyst gastrostomy seems to be in place and working.  There is always a chance that she may have loculated her fluid collection such that she could require another cyst gastrostomy however the KUB imaging suggests that this is in the region of where the larger fluid collection was.  There is always the chance that the cyst  tract could be blocked which is another reason why we should consider necrosectomy or reevaluation.  I think it is reasonable to hold today with a white count that is normal and also with the plan for a cross-sectional CT of the abdomen to further evaluate and see what things are looking like now post cystgastrostomy.  I have discussed the patient with her surgeon Dr. Barry Dienes and we agreed to this plan of action.  If we feel a necrosectomy needs to be performed I will need to reschedule outpatients the earliest I can schedule it as a time slot that I have available will not be until Wednesday but if something needs to be done on Tuesday for example we will work with our schedule as best we need to.  I will tentatively place her on the schedule for Wednesday.  All patient questions were answered, to the best of my ability, and the patient agrees to the aforementioned plan of action with follow-up as indicated.   PLAN/RECOMMENDATIONS  Agree with maintenance fluids may consider wanting to decrease that to 50 cc/h Agree with cross-sectional CT abdomen to further evaluate the previous cyst gastrostomy that was placed Okay to  restart heparin for today and continue for now while we await the possibility of another procedure If CT scan does not show anything to remarkable and her nausea without vomiting continues then she may be able to be initiated on clear liquids which I will defer to the primary service   Dr. Ardis Hughs is the primary inpatient GI provider this week however I will be available as necessary and please page/call with questions or concerns to the inpatient GI team.   Justice Britain, MD [facility] Gastroenterology Advanced Endoscopy Office # [phone]    LOS: 11 days  [facility]  05/20/2018, 9:44 AM

## 2018-05-20 NOTE — Progress Notes (Signed)
Patient ID: Cindy Bowman, female   DOB: 1950-09-11, 68 y.o.   MRN: [id number]   Brief GI update note. HeparinIV  gtt was restarted earlier this morning.  Abd/pelvic CT w/o contrast showed significant decrease in pancreatic pseudocyst. ? Ileus. Results reviewed by Dr. Rush Landmark. Ok to restart clear liquid diet. TF restarted as ordered by Dr. Barry Dienes. Patient's nurse reports patient sat up in the chair for 2 hours, ambulated up in the hall, passed a BM last night and no nausea today.

## 2018-05-20 NOTE — Progress Notes (Signed)
Nutrition Follow-up  DOCUMENTATION CODES:   Non-severe (moderate) malnutrition in context of chronic illness  INTERVENTION:   Once TF can be resumed: -Continue Osmolite 1.2 @ 65 ml/hr via J-tube (meets ~100% of needs)  NUTRITION DIAGNOSIS:   Moderate Malnutrition related to chronic illness, cancer and cancer related treatments as evidenced by percent weight loss, moderate fat depletion, moderate muscle depletion.  Ongoing.  GOAL:   Patient will meet greater than or equal to 90% of their needs  Not meeting.  MONITOR:   PO intake, Labs, Weight trends, TF tolerance, I & O's  ASSESSMENT:   Patient with PMH significant for pancreatic cancer (diagnosed 10/2017) on chemotherapy, renal mass, HTN, severe malnutrition, CKD III, and ventral hernia without obstruction. 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   **RD working remotely**  3/19- laparoscopy, J-tube placement, and repair of umbilical hernia  6/81: Osmolite 1.2 @ 20 ml/hr via J-tube, persistent N/V, CLD, moderate malnutrition diagnosis 3/24: TF advanced to goal rate: 50 ml/hr (~75% of needs) 3/25: TF turned off d/t pending procedure. Procedure cancelled, TF resumed. 3/26: TF turned off, s/p EGD, EUS, AXIOS STENT PLACEMENT, TF resumed at goal rate following procedure 3/27: Osm 1.2 was advanced to 65 ml/hr per surgery  Patient's TF is now stopped and pt is not receiving anything PO. Pt continued to have N/V over the weekend. Nausea continues despite TF being off and having cystic gastrostomy. Emesis has been reported as green in color, this does not indicate TF. Surgery has indicated that nausea is most likely related to pancreatic pseudocyst. Would recommend TF be resumed via J-tube once able given malnutrition and risk of increasing severity.  Per chart review, plan is for pt to have abdominal CT today. GI has tentatively planned a necrosectomy on 4/1.   Medications: D50 infusions today, D5 and .45%  NaCl w/ KCl infusion at 50 Labs reviewed: CBGs: 64-176 GFR: 30  Diet Order:   Diet Order            Diet NPO time specified Except for: Sips with Meds  Diet effective midnight              EDUCATION NEEDS:   Not appropriate for education at this time  Skin:  Skin Assessment: Skin Integrity Issues: Skin Integrity Issues:: Incisions Incisions: closed abdomen  Last BM:  3/29  Height:   Ht Readings from Last 1 Encounters:  05/16/18 5\' 3"  (1.6 m)    Weight:   Wt Readings from Last 1 Encounters:  05/20/18 60.1 kg    Ideal Body Weight:  52.3 kg  BMI:  Body mass index is 23.47 kg/m.  Estimated Nutritional Needs:   Kcal:  1650-1850 kcal  Protein:  80-100 grams  Fluid:  >/= 1.6 L/day  Clayton Bibles, MS, RD, LDN [place] Dietitian Pager: [phone] After Hours Pager: [phone]

## 2018-05-20 NOTE — Care Management Important Message (Signed)
Important Message  Patient Details  Name: Cindy Bowman MRN: [id number] Date of Birth: 1950/06/05   Medicare Important Message Given:  Yes    Kerin Salen 05/20/2018, 9:55 [place] Message  Patient Details  Name: Cindy Bowman MRN: [id number] Date of Birth: May 10, 1950   Medicare Important Message Given:  Yes    Kerin Salen 05/20/2018, 9:55 AM

## 2018-05-20 NOTE — Progress Notes (Signed)
ANTICOAGULATION CONSULT NOTE - Follow Up Consult  Pharmacy Consult for Heparin Indication: Hx of LLE DVT  Allergies  Allergen Reactions  . Nsaids Other (See Comments)    Decreased GFR  . Penicillins     Yeast infection Did it involve swelling of the face/tongue/throat, SOB, or low BP? No Did it involve sudden or severe rash/hives, skin peeling, or any reaction on the inside of your mouth or nose? No Did you need to seek medical attention at a hospital or doctor's office? No When did it last happen?Unknown If all above answers are "NO", may proceed with cephalosporin use.      Patient Measurements: Height: 5\' 3"  (160 cm) Weight: 132 lb 7.9 oz (60.1 kg) IBW/kg (Calculated) : 52.4 Heparin Dosing Weight: TBW  Vital Signs: Temp: 98.3 F (36.8 C) (03/30 1007) Temp Source: Oral (03/30 1007) BP: 118/59 (03/30 1007) Pulse Rate: 88 (03/30 1007)  Labs: Recent Labs    05/18/18 0432  05/19/18 0030  05/19/18 1600 05/19/18 2106 05/20/18 0414  HGB 7.7*  --  7.2*  --   --   --  6.7*  HCT 25.7*  --  23.1*  --   --   --  22.4*  PLT 248  --  230  --   --   --  244  HEPARINUNFRC  --    < >  --    < > <0.10* <0.10* <0.10*  CREATININE 2.52*  --  2.21*  --   --   --  1.94*   < > = values in this interval not displayed.    Estimated Creatinine Clearance: 22.6 mL/min (A) (by C-G formula based on SCr of 1.94 mg/dL (H)).  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 Abdominal/pelvic CT showed enlarged pancreatic pseudocysts and MD planning for endoscopic cyst gastrostomy. Pharmacy consulted to transition to IV heparin infusion in anticipation of procedure.  Significant events: 3/16 Apixaban held PTA 3/20-3/23 Lovenox 40 mg daily 3/24 Lovenox 50 mg q12h 3/24: Start IV heparin infusion 3/25: 05:45 IV heparin held 3/26  cystogastrostomy w/ stent placement  - Per MD, Resume Heparin without bolus at 22:00. Tentative plan for necrosectomy on 3/30. 3/30: heparin dip off at 0200 in anticipation for EGD/necrosectomy procedure today. With drop in hgb and pt's request to delay procedure, plan is to postpone procedure until 05/22/18 (to be done outpatient). Per GI, resume heparin drip back for now since procedure is postponed  Today, 05/20/2018: - heparin drip off at 0200 this morning - will resume heparin drip now (~11a) since GI procedure is postponed - Hgb down 6.7 (GI team suspects dilutional), plts stable; plan to transfuse 1 unit PRBC today - no bleeding documented - She is high risk for GI bleeding d/t stent placed and 2 cratered gastric ulcers, but no PPI to allow acid debridement of cyst cavity - scr trending down  Goal of Therapy:  Heparin level 0.3-0.7 units/ml Monitor platelets by anticoagulation protocol: Yes   Plan:  - resume heparin drip at 1500 units/hr - check 8 hr heparin level - monitor for s/s bleeding  Dia Sitter, PharmD, BCPS 05/20/2018 10:45 AM

## 2018-05-20 NOTE — Progress Notes (Signed)
4 Days Post-Op   Subjective/Chief Complaint: Having nausea again.  HCT down to 22.     Objective: Vital signs in last 24 hours: Temp:  [97.6 F (36.4 C)-98.6 F (37 C)] 98.3 F (36.8 C) (03/30 1007) Pulse Rate:  [84-104] 88 (03/30 1007) Resp:  [14-16] 16 (03/30 1007) BP: (104-128)/(47-68) 118/59 (03/30 1007) SpO2:  [100 %] 100 % (03/30 1007) Weight:  [60.1 kg] 60.1 kg (03/30 0518) Last BM Date: 05/19/18  Intake/Output from previous day: 03/29 0701 - 03/30 0700 In: 2010 [I.V.:1760; IV Piggyback:250] Out: 650 [Emesis/NG output:650] Intake/Output this shift: No intake/output data recorded.  General appearance: alert and cooperative Resp: breathing comfortably GI: moderate tenderness. j tube in place Ext:  Warm and well perfused.    Lab Results:  Recent Labs    05/19/18 0030 05/20/18 0414  WBC 11.7* 9.1  HGB 7.2* 6.7*  HCT 23.1* 22.4*  PLT 230 244   BMET Recent Labs    05/19/18 0030 05/20/18 0414  NA 137 140  K 4.2 4.4  CL 113* 117*  CO2 18* 18*  GLUCOSE 134* 80  BUN 35* 31*  CREATININE 2.21* 1.94*  CALCIUM 7.4* 7.4*   PT/INR No results for input(s): LABPROT, INR in the last 72 hours. ABG No results for input(s): PHART, HCO3 in the last 72 hours.  Invalid input(s): PCO2, PO2  Studies/Results: Dg Abd 2 Views  Result Date: 05/19/2018 CLINICAL DATA:  Pancreatic carcinoma, nausea and vomiting. EXAM: ABDOMEN - 2 VIEW COMPARISON:  05/17/2018 FINDINGS: The Axios stent cyst gastrostomy with double pigtail catheter appears stable in position. There is an elongated air-fluid level in the stomach. Jejunostomy tube shows stable positioning. Bowel-gas shows mild prominence of a few small bowel loops which may be consistent with a component of ileus/enteritis. No overt small bowel obstruction identified. No evidence of free intraperitoneal air. IMPRESSION: 1. Stable positioning of cyst gastrostomy stent with traversing double pigtail catheter. There is an elongated  air-fluid level in the stomach likely related to fluid distention of the stomach. 2. Mild gaseous prominence of a few small bowel loops which could be consistent with a component of ileus/enteritis. No overt small bowel obstruction. 3. Stable positioning of jejunostomy tube. Electronically Signed   By: Aletta Edouard M.D.   On: 05/19/2018 11:50    Anti-infectives: Anti-infectives (From admission, onward)   Start     Dose/Rate Route Frequency Ordered Stop   05/16/18 2355  ciprofloxacin (CIPRO) IVPB 200 mg     200 mg 100 mL/hr over 60 Minutes Intravenous Every 12 hours 05/16/18 1511     05/09/18 2000  ciprofloxacin (CIPRO) IVPB 400 mg     400 mg 200 mL/hr over 60 Minutes Intravenous Every 12 hours 05/09/18 1136 05/09/18 2101   05/09/18 0600  ciprofloxacin (CIPRO) IVPB 400 mg     400 mg 200 mL/hr over 60 Minutes Intravenous On call to O.R. 05/09/18 0546 05/09/18 0810      Assessment/Plan: s/p Procedure(s) with comments: ESOPHAGOGASTRODUODENOSCOPY (EGD) WITH PROPOFOL (N/A) UPPER ESOPHAGEAL ENDOSCOPIC ULTRASOUND (EUS) (N/A) AXIOS STENT PLACEMENT (N/A) - [place] Double Pigtail stent placed Balloon Dialtion of Cyst opening Cautery used to open cyst wall BIOPSY Nausea from pancreatic pseudocyst, ulcer, and possible role of gastroparesis. Anemia also multifactorial.  Chronic disease/chemo/hemodilution.    Continue IV cipro AKI continues to improve.  Will decrease IV fluids today.   Will repeat CT today after blood transfusion to evaluate where we are in terms of volume behind stomach.  LOS: 11 days    Stark Klein 05/20/2018

## 2018-05-20 NOTE — Progress Notes (Signed)
CRITICAL VALUE ALERT  Critical Value:  Hgb 6.7  Date & Time Notied:  3/30 0500  Provider Notified: Dr Marlou Shane  Orders Received/Actions taken: No orders given.

## 2018-05-20 NOTE — Progress Notes (Signed)
Writer notified Dr. Barry Dienes regarding patient CBG and MD gave writer a verbal order to restart patient J-tube

## 2018-05-20 NOTE — Progress Notes (Signed)
Called and spoke with patient over the phone this AM at 640.  She has a planned EGD/Necrosectomy later this morning. Her Hemogram this AM shows a Hgb<7. I suspect that this is dilutional in origin as she reports no overt blood in stools. She has received >9L of fluids in last 3-days. Thankfully her WBC has continued to improve as well. She needs to receive 1 unit pRBC to be optimized prior to any planned anesthesia event later today. Thus I have described with her reasoning for transfusion. We discussed the risks/benefits of transfusion. She has received blood products in the past. We will see patient later this AM and determine timing of procedure, hopefully later this AM based on timing and scheduling, though patient wondering whether we can hold on this altogether today. Hopefully around 930-1000AM, if schedule permits.  Justice Britain, MD [place] [facility] Advanced Endoscopy Office # [phone]

## 2018-05-21 LAB — TYPE AND SCREEN
ABO/RH(D): O POS
Antibody Screen: NEGATIVE
Unit division: 0

## 2018-05-21 LAB — GLUCOSE, CAPILLARY
Glucose-Capillary: 114 mg/dL — ABNORMAL HIGH (ref 70–99)
Glucose-Capillary: 119 mg/dL — ABNORMAL HIGH (ref 70–99)
Glucose-Capillary: 122 mg/dL — ABNORMAL HIGH (ref 70–99)
Glucose-Capillary: 126 mg/dL — ABNORMAL HIGH (ref 70–99)
Glucose-Capillary: 129 mg/dL — ABNORMAL HIGH (ref 70–99)
Glucose-Capillary: 97 mg/dL (ref 70–99)

## 2018-05-21 LAB — BASIC METABOLIC PANEL
Anion gap: 5 (ref 5–15)
BUN: 23 mg/dL (ref 8–23)
CO2: 14 mmol/L — ABNORMAL LOW (ref 22–32)
Calcium: 7.3 mg/dL — ABNORMAL LOW (ref 8.9–10.3)
Chloride: 118 mmol/L — ABNORMAL HIGH (ref 98–111)
Creatinine, Ser: 1.71 mg/dL — ABNORMAL HIGH (ref 0.44–1.00)
GFR calc Af Amer: 35 mL/min — ABNORMAL LOW (ref >60)
GFR calc non Af Amer: 30 mL/min — ABNORMAL LOW (ref >60)
Glucose, Bld: 142 mg/dL — ABNORMAL HIGH (ref 70–99)
Potassium: 3.6 mmol/L (ref 3.5–5.1)
Sodium: 137 mmol/L (ref 135–145)

## 2018-05-21 LAB — CBC
HCT: 26 % — ABNORMAL LOW (ref 36.0–46.0)
Hemoglobin: 8.1 g/dL — ABNORMAL LOW (ref 12.0–15.0)
MCH: 30.5 pg (ref 26.0–34.0)
MCHC: 31.2 g/dL (ref 30.0–36.0)
MCV: 97.7 fL (ref 80.0–100.0)
Platelets: 216 10*3/uL (ref 150–400)
RBC: 2.66 MIL/uL — ABNORMAL LOW (ref 3.87–5.11)
RDW: 20 % — ABNORMAL HIGH (ref 11.5–15.5)
WBC: 10.3 10*3/uL (ref 4.0–10.5)
nRBC: 0 % (ref 0.0–0.2)

## 2018-05-21 LAB — C DIFFICILE QUICK SCREEN W PCR REFLEX
C DIFFICLE (CDIFF) ANTIGEN: NEGATIVE
C Diff interpretation: NOT DETECTED
C Diff toxin: NEGATIVE

## 2018-05-21 LAB — BPAM RBC
Blood Product Expiration Date: 202004022359
ISSUE DATE / TIME: 202003300942
Unit Type and Rh: 5100

## 2018-05-21 LAB — HEPARIN LEVEL (UNFRACTIONATED): Heparin Unfractionated: 0.56 IU/mL (ref 0.30–0.70)

## 2018-05-21 MED ORDER — CIPROFLOXACIN HCL 500 MG PO TABS: 250.0000 mg | ORAL_TABLET | Freq: Once | ORAL | Status: DC

## 2018-05-21 MED ORDER — HYDROCORTISONE ACETATE 25 MG RE SUPP
25.0000 mg | Freq: Every day | RECTAL | Status: DC
  Administered 2018-05-21: 25 mg via RECTAL
  Filled 2018-05-21: qty 1

## 2018-05-21 MED ORDER — OSMOLITE 1.2 CAL PO LIQD
1000.0000 mL | ORAL | Status: DC
  Administered 2018-05-21 – 2018-05-22 (×2): 1000 mL
  Filled 2018-05-21 (×3): qty 1000

## 2018-05-21 MED ORDER — CIPROFLOXACIN HCL 500 MG PO TABS
250.0000 mg | ORAL_TABLET | Freq: Once | ORAL | Status: AC
  Administered 2018-05-21: 250 mg via ORAL
  Filled 2018-05-21: qty 1

## 2018-05-21 MED ORDER — HYDROCORTISONE 2.5 % RE CREA
TOPICAL_CREAM | Freq: Two times a day (BID) | RECTAL | Status: DC
  Administered 2018-05-21 – 2018-05-22 (×3): via RECTAL
  Filled 2018-05-21: qty 28.35

## 2018-05-21 NOTE — Progress Notes (Signed)
5 Days Post-Op   Subjective/Chief Complaint: Pt with no n/v yesterday.  Tolerated clear liquids.  CT showed significant reduction in size of pseudocyst.  Pain is much improved.     Objective: Vital signs in last 24 hours: Temp:  [97.8 F (36.6 C)-98.4 F (36.9 C)] 98 F (36.7 C) (03/31 0403) Pulse Rate:  [88-99] 99 (03/31 0403) Resp:  [14-16] 16 (03/31 0403) BP: (104-132)/(54-64) 124/60 (03/31 0403) SpO2:  [100 %] 100 % (03/31 0403) Weight:  [55 kg] 55 kg (03/31 0403) Last BM Date: 05/20/18  Intake/Output from previous day: 03/30 0701 - 03/31 0700 In: 3185.6 [P.O.:240; I.V.:1853; Blood:296; NG/GT:446.7; IV Piggyback:350] Out: 300 [Urine:300] Intake/Output this shift: Total I/O In: 1580 [I.V.:930; NG/GT:400; IV Piggyback:250] Out: 0   General appearance: sleeping but arousable.   Resp: breathing comfortably GI: non tender, non distended. j tube in place Ext:  Warm and well perfused.    Lab Results:  Recent Labs    05/20/18 0414 05/21/18 0418  WBC 9.1 10.3  HGB 6.7* 8.1*  HCT 22.4* 26.0*  PLT 244 216   BMET Recent Labs    05/19/18 0030 05/20/18 0414  NA 137 140  K 4.2 4.4  CL 113* 117*  CO2 18* 18*  GLUCOSE 134* 80  BUN 35* 31*  CREATININE 2.21* 1.94*  CALCIUM 7.4* 7.4*   PT/INR No results for input(s): LABPROT, INR in the last 72 hours. ABG No results for input(s): PHART, HCO3 in the last 72 hours.  Invalid input(s): PCO2, PO2  Studies/Results: Ct Abdomen Wo Contrast  Result Date: 05/20/2018 CLINICAL DATA:  s/p Procedure(s):LAPAROSCOPY DIAGNOSTIC AND J TUBE PLACEMENT (FEEDING TUBE) 3/19/2020Pt c/o N/V today EXAM: CT ABDOMEN WITHOUT CONTRAST TECHNIQUE: Multidetector CT imaging of the abdomen was performed following the standard protocol without IV contrast. COMPARISON:  Radiographs, 05/19/2018. Prior abdomen and pelvis CTs, most recent dated 05/10/2018. FINDINGS: Lower chest: Lower minimal effusions lobe. Dependent lung base opacity mostly in the lower  lobes consistent with atelectasis. Both have improved from the prior CT. Hepatobiliary: Diffusely decreased attenuation of the liver consistent with fatty infiltration. No discrete liver mass. Status post cholecystectomy. No bile duct dilation. Pancreas: Hypoattenuating mass, uncinate process, unchanged from the most recent prior study, less well-defined due to lack of contrast. The large pseudocyst noted along the anterior pancreatic body and tail is significantly decreased in size subsequent to placement of the double pigtail catheter extending from the stomach into the pseudocyst. Cyst now measures approximately 7.7 cm in long axis by 2.0 x 1.4 cm. Spleen: Normal in size without focal abnormality. Adrenals/Urinary Tract: No adrenal masses. Midpole left renal mass, 3.1 cm in greatest dimension, unchanged from the prior study. No new renal masses, no stones and no hydronephrosis. Visualized ureters are unremarkable. Stomach/Bowel: Double pigtail catheter extends from the mid stomach across the posterior gastric wall into the pancreatic pseudocyst, well-positioned and significantly decompressing the pseudocyst. Apparent wall thickening of the distal stomach, which may be reactive to the underlying pancreatic inflammation. Well-positioned jejunostomy tube stable from the prior CT. Mild distention proximal small bowel loops, maximum diameter 3.7 cm no defined transition point, but the more distal small bowel is decompressed. No small bowel wall thickening. Visualized colon is mostly decompressed. No wall thickening or adjacent inflammation. Vascular/Lymphatic: No aneurysm.  No discrete enlarged lymph nodes. Other: Trace amount of ascites primarily seen adjacent to the liver, increased from the previous CT scan. Musculoskeletal: No osteoblastic or osteolytic lesions. IMPRESSION: 1. Large pseudocyst noted on the prior CT  along the anterior margin of the pancreatic body and tail has been significant the decompressed  following placement a double pigtail catheter extending from the stomach into the pseudocyst, through the stomach posterior wall. 2. Trace amount of ascites which is increased compared to the prior CT. 3. Mild interval decrease in pleural effusions and dependent lung base atelectasis since the prior CT. 4. Mild dilation of proximal small bowel. This may reflect an adynamic ileus or a partial obstruction. A discrete transition point is not defined, however. 5. No other changes from the recent prior CT. Pancreatic mass is less well-defined due to lack of intravenous contrast. Left renal mass is stable. Electronically Signed   By: Lajean Manes M.D.   On: 05/20/2018 16:07   Dg Abd 2 Views  Result Date: 05/19/2018 CLINICAL DATA:  Pancreatic carcinoma, nausea and vomiting. EXAM: ABDOMEN - 2 VIEW COMPARISON:  05/17/2018 FINDINGS: The Axios stent cyst gastrostomy with double pigtail catheter appears stable in position. There is an elongated air-fluid level in the stomach. Jejunostomy tube shows stable positioning. Bowel-gas shows mild prominence of a few small bowel loops which may be consistent with a component of ileus/enteritis. No overt small bowel obstruction identified. No evidence of free intraperitoneal air. IMPRESSION: 1. Stable positioning of cyst gastrostomy stent with traversing double pigtail catheter. There is an elongated air-fluid level in the stomach likely related to fluid distention of the stomach. 2. Mild gaseous prominence of a few small bowel loops which could be consistent with a component of ileus/enteritis. No overt small bowel obstruction. 3. Stable positioning of jejunostomy tube. Electronically Signed   By: Aletta Edouard M.D.   On: 05/19/2018 11:50    Anti-infectives: Anti-infectives (From admission, onward)   Start     Dose/Rate Route Frequency Ordered Stop   05/16/18 2355  ciprofloxacin (CIPRO) IVPB 200 mg     200 mg 100 mL/hr over 60 Minutes Intravenous Every 12 hours 05/16/18  1511     05/09/18 2000  ciprofloxacin (CIPRO) IVPB 400 mg     400 mg 200 mL/hr over 60 Minutes Intravenous Every 12 hours 05/09/18 1136 05/09/18 2101   05/09/18 0600  ciprofloxacin (CIPRO) IVPB 400 mg     400 mg 200 mL/hr over 60 Minutes Intravenous On call to O.R. 05/09/18 0546 05/09/18 0810      Assessment/Plan: s/p Procedure(s) with comments: ESOPHAGOGASTRODUODENOSCOPY (EGD) WITH PROPOFOL (N/A) UPPER ESOPHAGEAL ENDOSCOPIC ULTRASOUND (EUS) (N/A) AXIOS STENT PLACEMENT (N/A) - [place]bs Double Pigtail stent placed Balloon Dialtion of Cyst opening Cautery used to open cyst wall BIOPSY Nausea from pancreatic pseudocyst, ulcer, and possible role of gastroparesis. Anemia also multifactorial.  Chronic disease/chemo/hemodilution.    Discuss with GI.  prob switch to po antibiotics or d/c when goes home.   AKI continues to improve.  If today's creatinine is better, will saline lock IV fluids.    Possibly home tomorrow if stable.     LOS: 12 days    Stark Klein 05/21/2018

## 2018-05-21 NOTE — Progress Notes (Addendum)
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 Had 3 loose to watery stools this morning with red blood on the toilet tissue which she attributes to hemorrhoids. Complains of external hemorrhoid irritation. No nausea or vomiting. Tolerating clear liquids. Ambulated up in the hall yesterday, plans to sit up in the chair and walk in hall this am.  Objective:  Vital signs in last 24 hours: Temp:  [97.8 F (36.6 C)-98.4 F (36.9 C)] 98 F (36.7 C) (03/31 0403) Pulse Rate:  [88-99] 99 (03/31 0403) Resp:  [14-16] 16 (03/31 0403) BP: (104-132)/(54-64) 124/60 (03/31 0403) SpO2:  [100 %] 100 % (03/31 0403) Weight:  [55 kg] 55 kg (03/31 0403) Last BM Date: 05/20/18 General:   Alert,  Well-developed,  in NAD Heart:  RRR, 1/6 systolic murmur  Pulm: Clear throughout Abdomen: Soft, epigastric mass protrudes without tenderness, + BS x 4 quads, incision above umbilicus intact, closed without exudate. J tube site intact.  Rectal: inflamed external hemorrhoids, internal hemorrhoids edematous, tight anal sphincter, no blood.  Extremities:  Without edema. Neurologic:  Alert and  oriented x4;  grossly normal neurologically. Psych:  Alert and cooperative. Normal mood and affect.  Intake/Output from previous day: 03/30 0701 - 03/31 0700 In: 3445.6 [P.O.:240; I.V.:2113; Blood:296; NG/GT:446.7; IV Piggyback:350] Out: 300 [Urine:300] Intake/Output this shift: No intake/output data recorded.  Lab Results: Recent Labs    05/19/18 0030 05/20/18 0414 05/21/18 0418  WBC 11.7* 9.1 10.3  HGB 7.2* 6.7* 8.1*  HCT 23.1* 22.4* 26.0*  PLT 230 244 216   BMET Recent Labs    05/19/18 0030 05/20/18 0414  NA 137 140  K 4.2 4.4  CL 113* 117*  CO2 18* 18*  GLUCOSE 134* 80  BUN 35* 31*  CREATININE 2.21* 1.94*  CALCIUM 7.4* 7.4*   LFT No results for input(s): PROT, ALBUMIN,  AST, ALT, ALKPHOS, BILITOT, BILIDIR, IBILI in the last 72 hours. PT/INR No results for input(s): LABPROT, INR in the last 72 hours. Hepatitis Panel No results for input(s): HEPBSAG, HCVAB, HEPAIGM, HEPBIGM in the last 72 hours.  Ct Abdomen Wo Contrast  Result Date: 05/20/2018 CLINICAL DATA:  s/p Procedure(s):LAPAROSCOPY DIAGNOSTIC AND J TUBE PLACEMENT (FEEDING TUBE) 3/19/2020Pt c/o N/V today EXAM: CT ABDOMEN WITHOUT CONTRAST TECHNIQUE: Multidetector CT imaging of the abdomen was performed following the standard protocol without IV contrast. COMPARISON:  Radiographs, 05/19/2018. Prior abdomen and pelvis CTs, most recent dated 05/10/2018. FINDINGS: Lower chest: Lower minimal effusions lobe. Dependent lung base opacity mostly in the lower lobes consistent with atelectasis. Both have improved from the prior CT. Hepatobiliary: Diffusely decreased attenuation of the liver consistent with fatty infiltration. No discrete liver mass. Status post cholecystectomy. No bile duct dilation. Pancreas: Hypoattenuating mass, uncinate process, unchanged from the most recent prior study, less well-defined due to lack of contrast. The large pseudocyst noted along the anterior pancreatic body and tail is significantly decreased in size subsequent to placement of the double pigtail catheter extending from the stomach into the pseudocyst. Cyst now measures approximately 7.7 cm in long axis by 2.0 x 1.4 cm. Spleen: Normal in size without focal abnormality. Adrenals/Urinary Tract: No adrenal masses. Midpole left renal mass, 3.1 cm in greatest dimension, unchanged from the prior study. No new renal masses, no stones and no hydronephrosis. Visualized ureters are unremarkable. Stomach/Bowel: Double pigtail catheter extends from the mid stomach across the posterior gastric wall into the pancreatic pseudocyst,  well-positioned and significantly decompressing the pseudocyst. Apparent wall thickening of the distal stomach, which may be  reactive to the underlying pancreatic inflammation. Well-positioned jejunostomy tube stable from the prior CT. Mild distention proximal small bowel loops, maximum diameter 3.7 cm no defined transition point, but the more distal small bowel is decompressed. No small bowel wall thickening. Visualized colon is mostly decompressed. No wall thickening or adjacent inflammation. Vascular/Lymphatic: No aneurysm.  No discrete enlarged lymph nodes. Other: Trace amount of ascites primarily seen adjacent to the liver, increased from the previous CT scan. Musculoskeletal: No osteoblastic or osteolytic lesions. IMPRESSION: 1. Large pseudocyst noted on the prior CT along the anterior margin of the pancreatic body and tail has been significant the decompressed following placement a double pigtail catheter extending from the stomach into the pseudocyst, through the stomach posterior wall. 2. Trace amount of ascites which is increased compared to the prior CT. 3. Mild interval decrease in pleural effusions and dependent lung base atelectasis since the prior CT. 4. Mild dilation of proximal small bowel. This may reflect an adynamic ileus or a partial obstruction. A discrete transition point is not defined, however. 5. No other changes from the recent prior CT. Pancreatic mass is less well-defined due to lack of intravenous contrast. Left renal mass is stable. Electronically Signed   By: Lajean Manes M.D.   On: 05/20/2018 16:07   Dg Abd 2 Views  Result Date: 05/19/2018 CLINICAL DATA:  Pancreatic carcinoma, nausea and vomiting. EXAM: ABDOMEN - 2 VIEW COMPARISON:  05/17/2018 FINDINGS: The Axios stent cyst gastrostomy with double pigtail catheter appears stable in position. There is an elongated air-fluid level in the stomach. Jejunostomy tube shows stable positioning. Bowel-gas shows mild prominence of a few small bowel loops which may be consistent with a component of ileus/enteritis. No overt small bowel obstruction identified. No  evidence of free intraperitoneal air. IMPRESSION: 1. Stable positioning of cyst gastrostomy stent with traversing double pigtail catheter. There is an elongated air-fluid level in the stomach likely related to fluid distention of the stomach. 2. Mild gaseous prominence of a few small bowel loops which could be consistent with a component of ileus/enteritis. No overt small bowel obstruction. 3. Stable positioning of jejunostomy tube. Electronically Signed   By: Aletta Edouard M.D.   On: 05/19/2018 11:50    Assessment / Plan: 1. 68 y.o. female with pancreatic adenocarcinoma stage Ib withmetastasis to the liver,pancreatic pseudocysts s/p cyst gastrostomy 3/26. Repeat abd/pelvic CT 3/30 showed a significant decrease in the pseudocyst along the pancreatic body and tail now measuring 7.7 cm in long axis by 2.0 x 1.4 cm (previously measured 7.2 x 12.7cm). Trace amount of ascites has increased. WBC 10.3. She is afebrile. Abdominal pain controlled on Fentanyl patch and Dilaudid PRN.  -continue Cipro 200mg  IV bid, consider switching to po Cipro bid if she remains afebrile and WBC remains WNLs -pain management per hospitalist  2. Nausea and vomiting secondary to # 1. -Zofran and Compazine PRN   3. AKI. Creatinine continues to decline.  Today's BMP pending. 3/30 Cr 1.944 <<3.01 <<3.36 <<3.35  4. Anemia. Hg << 6.7, transfused 1 unit PRBCs on 3/30. Hg >> 8.1. HCT 26.0 -continue to monitor CBC daily -transfuse for Hg < 7.  5. Malnourished on Osmolite 1.2 @ 60cc/hr  via J Tube.  -continue Osmolite  -clear liquids with HOB up 45 degrees  7. 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 Lovenox dc'd. On Heparin gtt  8. Diarrhea. Loose stools while on tube  feedings.  She passed 3 brown loose to watery stools this am with red blood on the toilet tissue. Received Imodium at 8am. A/P CT 3/30 showed mild dilatation small proximal bowel, unlikely ileus of psbo -discussed stools will be loose while on tube feedings,  hold Imodium for now - check c. Diff pcr  9. Rectal bleeding secondary to external and internal hemorrhoids. Patient reports having a colonoscopy 2 or 3 years ago, records not found in chart.  -Hydrocortisone Suppository 1 PR x 3 nights -Analpram cream to external hemorrhoids bid PRN -continue to monitor rectal bleeding -may need colonoscopy as outpatient     9. Left renal mass 3.1 cm stable     LOS: 12 days   Noralyn Pick  05/21/2018, 7:43 AM   ________________________________________________________________________  Velora Heckler GI MD note:  I eviewed the data and agree with the assessment and plan described above.   Owens Loffler, MD [facility] Pager [phone]

## 2018-05-21 NOTE — Progress Notes (Signed)
ANTICOAGULATION CONSULT NOTE - Follow Up Consult  Pharmacy Consult for Heparin Indication: Hx of LLE DVT   Allergies  Allergen Reactions  . Nsaids Other (See Comments)    Decreased GFR  . Penicillins     Yeast infection Did it involve swelling of the face/tongue/throat, SOB, or low BP? No Did it involve sudden or severe rash/hives, skin peeling, or any reaction on the inside of your mouth or nose? No Did you need to seek medical attention at a hospital or doctor's office? No When did it last happen?Unknown If all above answers are "NO", may proceed with cephalosporin use.      Patient Measurements: Height: 5\' 3"  (160 cm) Weight: 121 lb 4.1 oz (55 kg) IBW/kg (Calculated) : 52.4 Heparin Dosing Weight: TBW  Vital Signs: Temp: 98 F (36.7 C) (03/31 0403) Temp Source: Oral (03/31 0403) BP: 124/60 (03/31 0403) Pulse Rate: 99 (03/31 0403)  Labs: Recent Labs    05/19/18 0030  05/19/18 2106 05/20/18 0414 05/20/18 2227 05/21/18 0418  HGB 7.2*  --   --  6.7*  --  8.1*  HCT 23.1*  --   --  22.4*  --  26.0*  PLT 230  --   --  244  --  216  HEPARINUNFRC  --    < > <0.10* <0.10* 0.49  --   CREATININE 2.21*  --   --  1.94*  --   --    < > = values in this interval not displayed.    Estimated Creatinine Clearance: 22.6 mL/min (A) (by C-G formula based on SCr of 1.94 mg/dL (H)).  Assessment: 37 y/oF with PMH of pancreatic adenocarcinoma with metastasis to liver, LLE DVT on apixaban (held prior to admission since 3/16) admitted to [facility] on 3/19 due to FTT, s/p laparoscopic J-tube placement. Abdominal/pelvic CT showed enlarged pancreatic pseudocysts and MD planning for endoscopic cyst gastrostomy. Pharmacy consulted to transition to IV heparin infusion in anticipation of procedure.  Significant events: 3/16 Apixaban held PTA 3/20-3/23 Lovenox 40 mg daily 3/24 Lovenox 50 mg q12h 3/24: Start IV heparin infusion 3/25: 05:45 IV heparin held 3/26 cystogastrostomy w/ stent  placement  - Per MD, Resume Heparin without bolus at 22:00. Tentative plan for necrosectomy on 3/30. 3/30: heparin dip off at 0200 in anticipation for EGD/necrosectomy procedure today. With drop in hgb and pt's request to delay procedure, plan is to postpone procedure until 05/22/18 (to be done outpatient). Per GI, resume heparin drip back for now since procedure is postponed  Today, 05/21/2018: - heparin level remains therapeutic at 0.56 - Hgb improved to 8.1 this morning s/p 1 unit PRBC transfusion on 3/30 - blood noted with stools this AM, but pt thinks it's hemorrhoids related - She is high risk for GI bleeding d/t stent placed and 2 cratered gastric ulcers, but no PPI to allow acid debridement of cyst cavity - scr trending down  Goal of Therapy:  Heparin level 0.3-0.7 units/ml Monitor platelets by anticoagulation protocol: Yes   Plan:  - continue heparin drip at 1500 units/hr - daily heparin level - monitor for s/s bleeding  Dia Sitter, PharmD, BCPS 05/21/2018 7:00 AM

## 2018-05-21 NOTE — Progress Notes (Addendum)
Patient ID: Cindy Bowman, female   DOB: 1951-01-17, 68 y.o.   MRN: [id number]  Brief GI update: Plan per Dr. Rush Landmark. Last dose of  Cipro 200mg  IV was given at 12:17pm today. Plan to convert to po Cipro. First dose of Cipro 250mg  po will be given at [place] as preferred by pharmacy. Repeat BMP in am 4/1 to verify further Cipro po dosing. If GFR improving will prescribe Cipro 250mg  bid, if GFR without improvement will dose Cipro 250mg  po QD. If her renal status completely normalizes then Cipro 500mg  bid  eventually would be preferred. Repeat abd/pelvic CT as out patient in 2 to 3 weeks.

## 2018-05-22 ENCOUNTER — Ambulatory Visit: Payer: Medicare Other | Admitting: Pain Medicine

## 2018-05-22 ENCOUNTER — Encounter (HOSPITAL_COMMUNITY)
Admission: AD | Disposition: A | Discharge status: Home Health | Payer: Self-pay | Source: Home / Self Care | Attending: General Surgery

## 2018-05-22 ENCOUNTER — Telehealth: Payer: Self-pay

## 2018-05-22 LAB — BASIC METABOLIC PANEL
Anion gap: 4 — ABNORMAL LOW (ref 5–15)
BUN: 18 mg/dL (ref 8–23)
CHLORIDE: 121 mmol/L — AB (ref 98–111)
CO2: 14 mmol/L — ABNORMAL LOW (ref 22–32)
Calcium: 7.2 mg/dL — ABNORMAL LOW (ref 8.9–10.3)
Creatinine, Ser: 1.49 mg/dL — ABNORMAL HIGH (ref 0.44–1.00)
GFR calc Af Amer: 41 mL/min — ABNORMAL LOW (ref >60)
GFR calc non Af Amer: 35 mL/min — ABNORMAL LOW (ref >60)
Glucose, Bld: 136 mg/dL — ABNORMAL HIGH (ref 70–99)
Potassium: 3.5 mmol/L (ref 3.5–5.1)
Sodium: 139 mmol/L (ref 135–145)

## 2018-05-22 LAB — GLUCOSE, CAPILLARY
Glucose-Capillary: 111 mg/dL — ABNORMAL HIGH (ref 70–99)
Glucose-Capillary: 116 mg/dL — ABNORMAL HIGH (ref 70–99)
Glucose-Capillary: 120 mg/dL — ABNORMAL HIGH (ref 70–99)
Glucose-Capillary: 132 mg/dL — ABNORMAL HIGH (ref 70–99)

## 2018-05-22 LAB — CBC
HCT: 25.1 % — ABNORMAL LOW (ref 36.0–46.0)
Hemoglobin: 7.9 g/dL — ABNORMAL LOW (ref 12.0–15.0)
MCH: 30.9 pg (ref 26.0–34.0)
MCHC: 31.5 g/dL (ref 30.0–36.0)
MCV: 98 fL (ref 80.0–100.0)
Platelets: 233 10*3/uL (ref 150–400)
RBC: 2.56 MIL/uL — ABNORMAL LOW (ref 3.87–5.11)
RDW: 19.9 % — ABNORMAL HIGH (ref 11.5–15.5)
WBC: 11.1 10*3/uL — ABNORMAL HIGH (ref 4.0–10.5)
nRBC: 0 % (ref 0.0–0.2)

## 2018-05-22 LAB — HEPARIN LEVEL (UNFRACTIONATED): Heparin Unfractionated: 0.47 IU/mL (ref 0.30–0.70)

## 2018-05-22 SURGERY — ESOPHAGOGASTRODUODENOSCOPY (EGD) WITH PROPOFOL
Anesthesia: Monitor Anesthesia Care

## 2018-05-22 MED ORDER — APIXABAN 5 MG PO TABS
5.0000 mg | ORAL_TABLET | Freq: Two times a day (BID) | ORAL | Status: DC
  Administered 2018-05-22: 10:00:00 5 mg via ORAL
  Filled 2018-05-22: qty 1

## 2018-05-22 MED ORDER — PROCHLORPERAZINE MALEATE 5 MG PO TABS: 5.0000 mg | ORAL_TABLET | Freq: Four times a day (QID) | ORAL | 0 refills | Status: AC | PRN

## 2018-05-22 MED ORDER — HEPARIN SOD (PORK) LOCK FLUSH 100 UNIT/ML IV SOLN
500.0000 [IU] | INTRAVENOUS | Status: AC | PRN
  Administered 2018-05-22: 500 [IU]

## 2018-05-22 MED ORDER — LOPERAMIDE HCL 1 MG/7.5ML PO SUSP
2.0000 mg | Freq: Two times a day (BID) | ORAL | Status: DC | PRN
  Filled 2018-05-22: qty 15

## 2018-05-22 MED ORDER — PANCRELIPASE (LIP-PROT-AMYL) 12000-38000 UNITS PO CPEP
24000.0000 [IU] | ORAL_CAPSULE | Freq: Three times a day (TID) | ORAL | Status: DC
  Administered 2018-05-22: 24000 [IU] via ORAL
  Filled 2018-05-22: qty 2

## 2018-05-22 MED ORDER — CIPROFLOXACIN HCL 250 MG PO TABS: 250.0000 mg | ORAL_TABLET | Freq: Two times a day (BID) | ORAL | 0 refills | Status: DC

## 2018-05-22 MED ORDER — PROMETHAZINE HCL 12.5 MG RE SUPP: 12.5000 mg | Freq: Four times a day (QID) | RECTAL | 0 refills | Status: AC | PRN

## 2018-05-22 MED ORDER — CIPROFLOXACIN HCL 500 MG PO TABS
250.0000 mg | ORAL_TABLET | Freq: Two times a day (BID) | ORAL | Status: DC
  Administered 2018-05-22: 10:00:00 250 mg via ORAL
  Filled 2018-05-22: qty 1

## 2018-05-22 NOTE — Progress Notes (Addendum)
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 Mild lower abdominal "discomfort". No nausea or vomiting. Tolerating small amounts of clear liquids.   Objective:  Vital signs in last 24 hours: Temp:  [98 F (36.7 C)-98.3 F (36.8 C)] 98.3 F (36.8 C) (04/01 0604) Pulse Rate:  [94-101] 97 (04/01 0604) Resp:  [16-22] 22 (04/01 0604) BP: (138-140)/(52-72) 138/67 (04/01 0604) SpO2:  [99 %-100 %] 99 % (04/01 0604) Weight:  [56.8 kg] 56.8 kg (04/01 0450) Last BM Date: 05/22/18 General:   Alert and oriented in NAD Heart:  Regular rate and rhythm; no murmurs Pulm: Lungs clear throughout. Abdomen: soft, epigastric mass protrudes without tenderness, mild tenderness lower abdomen without rebound or guarding, + BS x 4 quads. Scar above umbilicus intact. J tube site intact.  Extremities:  Without edema. Neurologic:  Alert and  oriented x4;  grossly normal neurologically. Psych:  Alert and cooperative. Normal mood and affect.  Intake/Output from previous day: 03/31 0701 - 04/01 0700 In: 3761.8 [I.V.:2591.8; NG/GT:1170] Out: -  Intake/Output this shift: No intake/output data recorded.  Lab Results: Recent Labs    05/20/18 0414 05/21/18 0418 05/22/18 0451  WBC 9.1 10.3 11.1*  HGB 6.7* 8.1* 7.9*  HCT 22.4* 26.0* 25.1*  PLT 244 216 233   BMET Recent Labs    05/20/18 0414 05/21/18 0832 05/22/18 0451  NA 140 137 139  K 4.4 3.6 3.5  CL 117* 118* 121*  CO2 18* 14* 14*  GLUCOSE 80 142* 136*  BUN 31* 23 18  CREATININE 1.94* 1.71* 1.49*  CALCIUM 7.4* 7.3* 7.2*   Ct Abdomen Wo Contrast  Result Date: 05/20/2018 CLINICAL DATA:  s/p Procedure(s):LAPAROSCOPY DIAGNOSTIC AND J TUBE PLACEMENT (FEEDING TUBE) 3/19/2020Pt c/o N/V today EXAM: CT ABDOMEN  WITHOUT CONTRAST TECHNIQUE: Multidetector CT imaging of the abdomen was performed following the standard protocol without IV contrast. COMPARISON:  Radiographs, 05/19/2018. Prior abdomen and pelvis CTs, most recent dated 05/10/2018. FINDINGS: Lower chest: Lower minimal effusions lobe. Dependent lung base opacity mostly in the lower lobes consistent with atelectasis. Both have improved from the prior CT. Hepatobiliary: Diffusely decreased attenuation of the liver consistent with fatty infiltration. No discrete liver mass. Status post cholecystectomy. No bile duct dilation. Pancreas: Hypoattenuating mass, uncinate process, unchanged from the most recent prior study, less well-defined due to lack of contrast. The large pseudocyst noted along the anterior pancreatic body and tail is significantly decreased in size subsequent to placement of the double pigtail catheter extending from the stomach into the pseudocyst. Cyst now measures approximately 7.7 cm in long axis by 2.0 x 1.4 cm. Spleen: Normal in size without focal abnormality. Adrenals/Urinary Tract: No adrenal masses. Midpole left renal mass, 3.1 cm in greatest dimension, unchanged from the prior study. No new renal masses, no stones and no hydronephrosis. Visualized ureters are unremarkable. Stomach/Bowel: Double pigtail catheter extends from the mid stomach across the posterior gastric wall into the pancreatic pseudocyst, well-positioned and significantly decompressing the pseudocyst. Apparent wall thickening of the distal stomach, which may be reactive to the underlying pancreatic inflammation. Well-positioned jejunostomy tube stable from the prior CT. Mild distention proximal small bowel loops, maximum diameter 3.7 cm no defined transition point, but the more distal small bowel is decompressed. No small bowel wall thickening. Visualized colon is mostly  decompressed. No wall thickening or adjacent inflammation. Vascular/Lymphatic: No aneurysm.  No discrete  enlarged lymph nodes. Other: Trace amount of ascites primarily seen adjacent to the liver, increased from the previous CT scan. Musculoskeletal: No osteoblastic or osteolytic lesions. IMPRESSION: 1. Large pseudocyst noted on the prior CT along the anterior margin of the pancreatic body and tail has been significant the decompressed following placement a double pigtail catheter extending from the stomach into the pseudocyst, through the stomach posterior wall. 2. Trace amount of ascites which is increased compared to the prior CT. 3. Mild interval decrease in pleural effusions and dependent lung base atelectasis since the prior CT. 4. Mild dilation of proximal small bowel. This may reflect an adynamic ileus or a partial obstruction. A discrete transition point is not defined, however. 5. No other changes from the recent prior CT. Pancreatic mass is less well-defined due to lack of intravenous contrast. Left renal mass is stable. Electronically Signed   By: Lajean Manes M.D.   On: 05/20/2018 16:07    Assessment / Plan:  1. 68 y.o. female with pancreatic adenocarcinoma stage Ib withmetastasis to the liver,pancreatic pseudocysts s/p cyst gastrostomy 3/26. Repeat abd/pelvic CT 3/30 showed a significant decrease in the pseudocyst along the pancreatic body and tail now measuring 7.7 cm in long axis by 2.0 x 1.4 cm (previously measured 7.2 x 12.7cm). Trace amount of ascites has increased. Necrosectomy deferred for now. WBC 11.1 >> 10.3. She is afebrile. Abdominal pain controlled on Fentanyl patch Q 72hrs and Tylenol.  -Cipro 250mg  po bid -monitor temp and WBC -monitor abd pain -pain management per hospitalist -encouraged ambulation up in hall, sit up in chair -repeat abd/pelvic CT in 2 to 3 weeks  2. Nausea and vomiting secondary to # 1. No N/V past 48 hrs. -Zofran and Compazine PRN   3. AKI. Creatinine continues to decline. Cr. 1.49 << 1.71 -IVF 50ccc/hr  4. Anemia. Hg << 6.7, transfused 1 unit  PRBCs on 3/30. 4/1 Hg 7.9 << 8.1. Less rectal bleeding from hemorrhoids after Anusol suppository and cream utilized.  -continue to monitor CBC daily -transfuse for Hg < 7.  5. Malnourished on Osmolite 1.2 @ 60cc/hr  via J Tube.  -continue Osmolite @ 60cc/lhr, continue recommendations from RD -increase clear liquid intake with HOB up 45 degrees  7. 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 Lovenox dc'd.On Heparin gtt  8. Diarrhea. Loose stools while on tube feedings. C.diff antigen and toxin negative. GI panel pending.  A/P CT 3/30 showed mild dilatation small proximal bowel, unlikely ileus of psbo. C. Diff antigen and toxin negative. GI panel pending.  -await GI panel results -if afebrile, WBC normal may give Imodium 1 tab po PRN  if having > 3 loose stools/day  9. Rectal bleeding secondary to external and internal hemorrhoids. Patient reports having a colonoscopy 2 or 3 years ago, records not found in chart.  -Hydrocortisone Suppository 1 PR x 2  nights -Analpram cream to external hemorrhoids bid PRN -continue to monitor rectal bleeding -may need colonoscopy as outpatient     9. Left renal mass 3.1 cm stable -continue follow up with oncologist Dr. Grayland Ormond   10. Hypocalcemia Ca 7.2 -management per hospitalist     LOS: 13 days   Cindy Bowman  05/22/2018, 8:33 AM   ________________________________________________________________________  Velora Heckler GI MD note:  I personally examined the patient, reviewed the data and agree with the assessment and plan described above.  She overall feels much better since cyst/gastrostomy last  week.  Having some loose stools. C. Diff testing is negative.  I suspect the loose stools are from only liquid diet and also possibly pancreatic insufficiency. She was on pancreatic enzyme supplements prior to admit and I will restart those now.  I discussed with Dr. Rush Landmark and he feels it is safe to advance her diet as tolerated (I will order  that).  She should stay on oral antibiotics (cipro BID) for at least the next 2-3 weeks. Dr. Rush Landmark will be arranging for a CT scan in 2-3 weeks and will decide if she needs to continue the antibiotics after reviewing the scan.   Owens Loffler, MD [facility] Pager [phone]

## 2018-05-22 NOTE — Plan of Care (Signed)
  Problem: Activity: Goal: Risk for activity intolerance will decrease Outcome: Progressing   Problem: Safety: Goal: Ability to remain free from injury will improve Outcome: Progressing   Problem: Clinical Measurements: Goal: Ability to maintain clinical measurements within normal limits will improve Outcome: Progressing   

## 2018-05-22 NOTE — Telephone Encounter (Signed)
Spoke with pt in regards to follow up appointment. Advised appointment will be conducted over the phone with ADara Lords PA on 4/20 at scheduled appointment time. Pt verbalized understanding.

## 2018-05-22 NOTE — TOC Progression Note (Signed)
Transition of Care [facility]) - Progression Note    Patient Details  Name: Cindy Bowman MRN: [id number] Date of Birth: Oct 22, 1950  Transition of Care [facility]) CM/SW Contact  Leeroy Cha, RN Phone Number: 05/22/2018, 10:25 AM  Clinical Narrative:    Patient is set up with [facility] for hhc and tube feedings.   Expected Discharge Plan: [place] Barriers to Discharge: No Barriers Identified  Expected Discharge Plan and Services Expected Discharge Plan: [place]   Discharge Planning Services: CM Consult Post Acute Care Choice: [place] arrangements for the past 2 months: Single Family Home Expected Discharge Date: 05/22/18               DME Arranged: 3-N-1, Cane(quad cane) DME Agency: [facility] HH Arranged: RN [place] Agency: [facility]   Social Determinants of Health (SDOH) Interventions    Readmission Risk Interventions No flowsheet data found.

## 2018-05-22 NOTE — Progress Notes (Addendum)
[place] for Heparin Indication: Hx of LLE DVT   Allergies  Allergen Reactions  . Nsaids Other (See Comments)    Decreased GFR  . Penicillins     Yeast infection Did it involve swelling of the face/tongue/throat, SOB, or low BP? No Did it involve sudden or severe rash/hives, skin peeling, or any reaction on the inside of your mouth or nose? No Did you need to seek medical attention at a hospital or doctor's office? No When did it last happen?Unknown If all above answers are "NO", may proceed with cephalosporin use.      Patient Measurements: Height: 5\' 3"  (160 cm) Weight: 125 lb 3.5 oz (56.8 kg) IBW/kg (Calculated) : 52.4 Heparin Dosing Weight: TBW  Vital Signs: Temp: 98.3 F (36.8 C) (04/01 0604) Temp Source: Oral (04/01 0604) BP: 138/67 (04/01 0604) Pulse Rate: 97 (04/01 0604)  Labs: Recent Labs    05/20/18 0414 05/20/18 2227 05/21/18 0418 05/21/18 0832 05/22/18 0451  HGB 6.7*  --  8.1*  --  7.9*  HCT 22.4*  --  26.0*  --  25.1*  PLT 244  --  216  --  233  HEPARINUNFRC <0.10* 0.49  --  0.56 0.47  CREATININE 1.94*  --   --  1.71* 1.49*    Estimated Creatinine Clearance: 29.5 mL/min (A) (by C-G formula based on SCr of 1.49 mg/dL (H)).  Assessment: 50 y/oF with PMH of pancreatic adenocarcinoma with metastasis to liver, LLE DVT on apixaban (held prior to admission since 3/16) admitted [facility] on 3/19 due to FTT, s/p laparoscopic J-tube placement. Abdominal/pelvic CT showed enlarged pancreatic pseudocysts and MD planning for endoscopic cyst gastrostomy. Pharmacy consulted to transition to IV heparin infusion in anticipation of procedure.  Significant events:  3/16 Apixaban held PTA  3/20-3/23 Lovenox 40 mg daily  3/24 Lovenox 50 mg q12h  3/24: Start IV heparin infusion  3/25: 05:45 IV heparin held  3/26 cystogastrostomy w/ stent placement; high risk for GI bleeding d/t stent placed and 2 cratered gastric ulcers,  but no PPI to allow acid debridement of cyst cavity   3/30: necrosectomy delayed d/t drop in hgb and pt request   Today, 05/22/2018:  CBC: Hgb low but stable from yesterday; Plt WNL  Most recent heparin level therapeutic on 1500 units/hr  Blood noted in stool yesterday, but per patient related to hemorrhoids  No infusion issues per nursing  Plans for necrosectomy deferred for now - converting back to Eliquis today  SCr continues to improve, but not back to baseline yet  Goal of Therapy: Heparin level 0.3-0.7 units/ml Monitor platelets by anticoagulation protocol: Yes  Plan:  Resume Eliquis 5 mg PO bid  Stop heparin with first dose of Eliquis  Surgery aware that J-tube administration should be avoided for Eliquis once resumed (d/t decreased absorption)  RN instructed to give Eliquis PO only, and contact Rx or Surgery if unable to tolerate any doses   Reuel Boom, PharmD, BCPS [phone] 05/22/2018, 8:44 AM

## 2018-05-23 ENCOUNTER — Other Ambulatory Visit: Payer: Self-pay

## 2018-05-23 ENCOUNTER — Encounter: Payer: Self-pay | Admitting: Gastroenterology

## 2018-05-23 ENCOUNTER — Ambulatory Visit: Payer: Medicare Other

## 2018-05-23 ENCOUNTER — Telehealth: Payer: Self-pay | Admitting: *Deleted

## 2018-05-23 DIAGNOSIS — C787 Secondary malignant neoplasm of liver and intrahepatic bile duct: Principal | ICD-10-CM

## 2018-05-23 DIAGNOSIS — C259 Malignant neoplasm of pancreas, unspecified: Secondary | ICD-10-CM

## 2018-05-23 MED ORDER — OSMOLITE 1.2 CAL PO LIQD: ORAL | 0 refills | Status: DC

## 2018-05-23 NOTE — Telephone Encounter (Signed)
Dr. Donneta Romberg office at [facility] called. Patient needs a BMP added to the labs to be drawn in cancer center next Tuesday. Please route these labs to the Dr. Rush Landmark. I reviewed the chart - Pt has standing cbc, lipase and amlyase and metc orders.  Give her hgb was low in [place] and she had a prepare RBC. I've added a hold tube collection. hgb yesterday was 7.9

## 2018-05-23 NOTE — Telephone Encounter (Signed)
Patient's son calling and requesting samples of osmolite as home health has not yet delivered the RFs. I spoke with Joli and she will have samples of the Osmolite for the patient's caregiver to pick up and place them at the front desk. Caregiver aware of the covid restrictions. He is asymptomatic. I explained to him to let the nurses know upon arrival and we would meet him at the door upon his arrival with the samples of omoslite.

## 2018-05-23 NOTE — Progress Notes (Signed)
I spoke with the patient's son.  She is having labs next week with oncology Dr. Shari Heritage.  I spoke with Nira Conn in his office and she will route the BMP to Dr. Rush Landmark next week.  Patient's son verified that she is taking Cipro 250 mg BID

## 2018-05-23 NOTE — Discharge Summary (Signed)
Physician Discharge Summary  Patient ID: Cindy Bowman MRN: [id number] DOB/AGE: Jul 17, 1950 68 y.o.  Admit date: 05/09/2018 Discharge date: 05/23/2018  Admission Diagnoses: Patient Active Problem List   Diagnosis Date Noted  . Pancreatic necrosis   . Pancreatic cancer metastasized to liver ([place]) 05/09/2018  . Malnutrition of moderate degree 04/08/2018  . Pain, cancer 04/05/2018  . Pancreatic pseudocyst   . Palliative care encounter   . Ventral hernia without obstruction or gangrene 03/21/2018  . Acute deep vein thrombosis (DVT) of femoral vein of left lower extremity ([place]) 03/11/2018  . Acute kidney injury ([place]) 03/11/2018  . Protein-calorie malnutrition, severe 03/10/2018  . Acute pancreatitis 03/08/2018  . Chemotherapy induced neutropenia ([place]) 12/12/2017  . History of Clostridioides difficile diarrhea 12/12/2017  . Nausea & vomiting 11/16/2017  . Malignant neoplasm of head of pancreas ([place]) 10/18/2017  . Spinal stenosis 09/19/2017  . CKD (chronic kidney disease) stage 3, GFR 30-59 ml/min ([facility]) 07/24/2017  . Gallstones 06/19/2017  . 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 Essential hypertension 12/14/2015  . 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  Unfortunately, metastatic disease was found.  She was started on trophic tube feeds day of surgery and these were able to be advanced.  However, over the next few days, she was throwing up liters of gastric secretion/bile despite taking in nothing.  She had been throwing up some and having issues  eating, which was why the J tube was placed, however she was previously able to keep down some liquids.  CT was repeated and showed significant enlargement of her pseudocyst.  GI was consulted, and due to her effective gastric outlet obstruction, she underwent endoscopic cyst gastrostomy 05/16/2018 by Dr. Rush Landmark while an inpatient.  This was delayed a few days because she also dehydration and acute kidney injury.  She had acute on chronic anemia which required blood transfusion.  Once her pancreatic pseudocyst was significantly drained, she was doing much better.  This was confirmed by CT.  Throughout this period of n/v, she was passing gas and tolerating tube feeds.  She had a bit of loose stools from the tube feeds.  C diff was sent and was negative.  She was placed on cipro pre and post cyst gastrostomy and was discharged on this.  Her kidney function stabilized as well as her anemia.  She was discharged in stable condition to home with home health.    Consults: GI  Significant Diagnostic Studies: labs: Prior to d/c, Cr down to 1.49 (peak of 3.3), WBCs 11.1, HCT 25.1 and microbiology: negative c diff.    Treatments: IV hydration, antibiotics: Cipro, surgery: see above and tube feeds.    Discharge Exam: Blood pressure 138/67, pulse 97, temperature 98.3 F (36.8 C), temperature source Oral, resp. rate (!) 22, height _0  (1.6 m), weight 56.8 kg, SpO2 99 %. General appearance: alert and no distress Resp: breathing comfortably GI: soft, non distended, non tender, midline wound c/d/i, j tube in place Extremities: extremities normal, atraumatic, no cyanosis or edema  Disposition:   Discharge Instructions    Call MD for:  difficulty breathing, headache or visual disturbances   Complete  by:  As directed    Call MD for:  persistant nausea and vomiting   Complete by:  As directed    Call MD for:  redness, tenderness, or signs of infection (pain, swelling, redness, odor or green/yellow discharge  around incision site)   Complete by:  As directed    Call MD for:  severe uncontrolled pain   Complete by:  As directed    Call MD for:  temperature >100.4   Complete by:  As directed    Diet - low sodium heart healthy   Complete by:  As directed    Increase activity slowly   Complete by:  As directed      Allergies as of 05/22/2018      Reactions   Nsaids Other (See Comments)   Decreased GFR   Penicillins    Yeast infection Did it involve swelling of the face/tongue/throat, SOB, or low BP? No Did it involve sudden or severe rash/hives, skin peeling, or any reaction on the inside of your mouth or nose? No Did you need to seek medical attention at a hospital or doctor's office? No When did it last happen?Unknown If all above answers are "NO", may proceed with cephalosporin use.      Medication List    TAKE these medications   apixaban 5 MG Tabs tablet Commonly known as:  Eliquis Take 1 tablet (5 mg total) by mouth 2 (two) times daily.   ciprofloxacin 250 MG tablet Commonly known as:  CIPRO Take 1 tablet (250 mg total) by mouth 2 (two) times daily.   Creon 36000 UNITS Cpep capsule Generic drug:  lipase/protease/amylase Take 81,191-47,829 Units by mouth See admin instructions. Take 36000 units with small meals and 72000 units with big meals   fentaNYL 75 MCG/HR Commonly known as:  [place] 1 patch onto the skin every 3 (three) days.   folic acid 1 MG tablet Commonly known as:  FOLVITE TAKE 1 TABLET BY MOUTH EVERY DAY   gabapentin 300 MG capsule Commonly known as:  NEURONTIN Take 1 capsule (300 mg total) by mouth at bedtime.   lidocaine-prilocaine cream Commonly known as:  EMLA Apply 1 application topically as needed. What changed:  reasons to take this   loperamide 2 MG capsule Commonly known as:  IMODIUM Take 2 mg by mouth as needed for diarrhea or loose stools.   losartan 50 MG tablet Commonly known as:  COZAAR Take 1 tablet (50 mg total) by  mouth daily.   Narcan 4 MG/0.1ML Liqd nasal spray kit Generic drug:  naloxone Place 1 spray into the nose as needed (opioid overdose).   oxyCODONE 15 MG immediate release tablet Commonly known as:  ROXICODONE Take 1-2 tablets (15-30 mg total) by mouth every 4 (four) hours as needed for pain.   potassium chloride 10 MEQ CR capsule Commonly known as:  Klor-Con Sprinkle Take 2 capsules (20 mEq total) by mouth daily. May open capsule to sprinkle on food.   prochlorperazine 5 MG tablet Commonly known as:  COMPAZINE Take 1 tablet (5 mg total) by mouth every 6 (six) hours as needed for nausea or vomiting.   promethazine 12.5 MG tablet Commonly known as:  PHENERGAN Take 12.5 mg by mouth every 6 (six) hours as needed for nausea or vomiting. What changed:  Another medication with the same name was added. Make sure you understand how and when to take each.   promethazine 12.5 MG suppository Commonly known as:  PHENERGAN Place 1 suppository (  12.5 mg total) rectally every 6 (six) hours as needed for nausea or vomiting. What changed:  You were already taking a medication with the same name, and this prescription was added. Make sure you understand how and when to take each.   tiZANidine 2 MG tablet Commonly known as:  ZANAFLEX Take 1 tablet (2 mg total) by mouth every 8 (eight) hours as needed for muscle spasms.      Follow-up Information    Lloyd Huger, MD Follow up on 05/13/2018.   Specialty:  Oncology Contact information: [place] [address] [place] Follow up.   Specialty:  Home Health Services Contact information: [address] [place] [address] [phone]           Signed: Stark Klein 05/23/2018, 11:37 AM

## 2018-05-23 NOTE — Progress Notes (Signed)
This is for documentation purposes and for follow-up next week. The patient should remain on ciprofloxacin 250 mg twice daily until a repeat creatinine has improved to her near baseline.  I would like her GFR to be at least greater than 50.  At the time point of her GFR being greater than 50 she should be transitioned to ciprofloxacin 500 twice daily. I will have our RN team reach out to the patient so that she may come in next week early to have the labs drawn and check to see that her creatinine has continued to improve.  If her GFR is greater than 50 then I would like to transition her to 500 mg twice daily ciprofloxacin to continue until follow-up CT scan that is to be done in 2-1/2 to 3 weeks from yesterday. Patty or covering RN please reach out to patient tomorrow to try and get her to have labs done next week a BMP will suffice. Thank you.  Justice Britain, MD [facility] Office # [phone]

## 2018-05-23 NOTE — Progress Notes (Signed)
Nutrition Follow-up:  RD working remotely.  Chart reviewed.  Patient s/p J tube placement on 3/19.  Patient with pancreatic cancer followed by Dr. Grayland Ormond.  Reviewed notes from hospital admission.  Noted nausea likely from pancreatic pseudocyst not tube feeding per surgery and inpatient RD note.    Son calling cancer center this am asking for tube feeding as home health not delivered yet.    Noted patient was tolerating tube feeding at 66ml/hr for 24 hours inpatient.    Medications: reviewed  Labs: reviewed  Anthropometrics:   Weight noted at 125 lb hospital weight   Estimated Energy Needs  Kcals: 1620-1890calories Protein: 81-94 g Fluid: 1.8 L/d  NUTRITION DIAGNOSIS: Malnutrition continues   MALNUTRITION DIAGNOSIS: severe malnutrition continues   INTERVENTION:  Spoke with son and reports [facility] was home health agency that he has been working with.  Spoke with Dorian Pod with [facility] and they do not provide enteral nutrition formula or supplies.  Son was given a script for formula and took to [facility] but will be several days before it is in.  Does not have a pump to deliver nutrition.   RD does not recommend bolus feeding with J-tube needs to be continuous feeding with pump.  Son agreeable to [facility] providing enteral nutrition and supplies.   Recommend rate of 81ml/hr for 20 hours via pump. Water flush with 1100ml QID to better meet hydration needs.   Tube feeding will provide 1872 calories, 86 g protein and 1732ml free water (including water flush).  Contacted Brad with [facility] and made referral.  Leroy Sea reports will have supplies out to patient this afternoon.  Called son to discuss tube feeding regimen and enteral nutrition referral.       MONITORING, EVALUATION, GOAL: Patient will tolerate tube feeding and maintain nutrition   NEXT VISIT: phone call by Monday, April 6  Cameron Katayama B. Zenia Resides, [place], [place] Registered Dietitian [phone] (pager)

## 2018-05-24 ENCOUNTER — Other Ambulatory Visit: Payer: Self-pay

## 2018-05-24 ENCOUNTER — Telehealth: Payer: Self-pay | Admitting: *Deleted

## 2018-05-24 NOTE — Telephone Encounter (Signed)
Son informed that this has already been addressed and we will do labs

## 2018-05-24 NOTE — Telephone Encounter (Signed)
Agreed. We'll do everything here.

## 2018-05-24 NOTE — Telephone Encounter (Signed)
Rinell called reporting that patient received a call from a Dr Rush Landmark regarding patient needing to come to [facility] for lab draw regarding her kidneys as follow up from hospitalization. Rinell does not want to take her out and since she already has an appointment here, he told them he wants Cindy Bowman to draw her labs. He said to expect a call for [facility] for lab orders

## 2018-05-27 ENCOUNTER — Other Ambulatory Visit: Payer: Self-pay | Admitting: *Deleted

## 2018-05-27 ENCOUNTER — Other Ambulatory Visit: Payer: Self-pay

## 2018-05-27 ENCOUNTER — Inpatient Hospital Stay: Payer: Medicare Other | Attending: Oncology

## 2018-05-27 ENCOUNTER — Telehealth: Payer: Self-pay | Admitting: Hospice and Palliative Medicine

## 2018-05-27 DIAGNOSIS — E876 Hypokalemia: Secondary | ICD-10-CM | POA: Insufficient documentation

## 2018-05-27 DIAGNOSIS — C250 Malignant neoplasm of head of pancreas: Secondary | ICD-10-CM | POA: Insufficient documentation

## 2018-05-27 DIAGNOSIS — C787 Secondary malignant neoplasm of liver and intrahepatic bile duct: Secondary | ICD-10-CM | POA: Insufficient documentation

## 2018-05-27 DIAGNOSIS — E860 Dehydration: Secondary | ICD-10-CM | POA: Insufficient documentation

## 2018-05-27 DIAGNOSIS — R110 Nausea: Secondary | ICD-10-CM | POA: Insufficient documentation

## 2018-05-27 DIAGNOSIS — Z515 Encounter for palliative care: Secondary | ICD-10-CM | POA: Insufficient documentation

## 2018-05-27 NOTE — Progress Notes (Signed)
[place]  [phone]) [phone] [phone]  ID: Cindy Bowman OB: 1950/05/04  MR#: [id number]  TJQ#:[id number]  Patient Care Team: Glendon Axe, MD as PCP - General (Internal Medicine) Clent Jacks, RN as Registered Nurse Lloyd Huger, MD as Medical Oncologist (Medical Oncology)  CHIEF COMPLAINT: Clinical stage IV pancreatic adenocarcinoma.  INTERVAL HISTORY: Patient returns to clinic today for further evaluation, repeat laboratory work and discussion of treatment planning.  She has not been seen since her discharge from the hospital after J-tube placement and noting metastatic disease in the liver.  Since discharge she has had significant diarrhea associated with her tube feeds.  She also has increased nausea.  She has persistent weakness and fatigue as well as a decreased performance status.  She does not complain of pain today.  She has no neurologic complaints. She denies any recent fevers.  She continues to have a poor appetite and weight loss.  She denies any chest pain, shortness of breath, cough, hemoptysis.  She has no urinary complaints.  Patient feels generally terrible, but offers no further specific complaints today.  REVIEW OF SYSTEMS:   Review of Systems  Constitutional: Positive for malaise/fatigue and weight loss. Negative for fever.  HENT: Negative.  Negative for congestion and sinus pain.   Respiratory: Negative.  Negative for cough and shortness of breath.   Cardiovascular: Negative for chest pain and leg swelling.  Gastrointestinal: Positive for diarrhea and nausea. Negative for abdominal pain, blood in stool, melena and vomiting.  Genitourinary: Negative.  Negative for dysuria.  Musculoskeletal: Negative.  Negative for back pain.  Skin: Negative.  Negative for rash.  Neurological: Positive for weakness. Negative for dizziness, focal weakness and headaches.  Psychiatric/Behavioral: Negative.  The patient is not nervous/anxious.      As per HPI. Otherwise, a complete review of systems is negative.  PAST MEDICAL HISTORY: Past Medical History:  Diagnosis Date   Anemia    Complication of anesthesia    Difficult airway for intubation    2018 Oophorectomy, CRNA unable to visualized vocal cords with MAC 3 bladed. Easy ventilations O2 sats 99%. Rolls placed under the shoulders and pillows under the head Repeat Laryngoscopy with MAC 3 blade > Visualization not much better. O2 sats 99%. Intubation with Glidescope on the Second Attempt. BBSE good ETCO 2 O 2 sats 99%.    Essential hypertension    Pancreatic cancer (HCC)    PONV (postoperative nausea and vomiting)    UTI (urinary tract infection)     PAST SURGICAL HISTORY: Past Surgical History:  Procedure Laterality Date   BIOPSY  05/16/2018   Procedure: BIOPSY;  Surgeon: Rush Landmark, Telford Nab., MD;  Location: [facility];  Service: Gastroenterology;;   CESAREAN SECTION     CHOLECYSTECTOMY     ESOPHAGOGASTRODUODENOSCOPY (EGD) WITH PROPOFOL N/A 05/16/2018   Procedure: ESOPHAGOGASTRODUODENOSCOPY (EGD) WITH PROPOFOL;  Surgeon: Irving Copas., MD;  Location: [facility];  Service: Gastroenterology;  Laterality: N/A;   EUS N/A 10/25/2017   Procedure: FULL UPPER ENDOSCOPIC ULTRASOUND (EUS) RADIAL;  Surgeon: Jola Schmidt, MD;  Location: [facility];  Service: Endoscopy;  Laterality: N/A;   IR RADIOLOGIST EVAL & MGMT  05/08/2018   LAPAROSCOPY N/A 05/09/2018   Procedure: LAPAROSCOPY DIAGNOSTIC AND J TUBE PLACEMENT (FEEDING TUBE);  Surgeon: Stark Klein, MD;  Location: [facility];  Service: General;  Laterality: N/A;   OOPHORECTOMY     PANCREATIC STENT PLACEMENT N/A 05/16/2018   Procedure: Zara Chess PLACEMENT;  Surgeon: Irving Copas., MD;  Location: [facility];  Service: Gastroenterology;  Laterality: N/A;  Hobbs Double Pigtail stent placed Balloon Dialtion of Cyst opening Cautery used to open cyst wall   PORTA CATH INSERTION N/A 11/12/2017     Procedure: PORTA CATH INSERTION;  Surgeon: Algernon Huxley, MD;  Location: [facility];  Service: Cardiovascular;  Laterality: N/A;   TUBAL LIGATION     UPPER ESOPHAGEAL ENDOSCOPIC ULTRASOUND (EUS) N/A 05/16/2018   Procedure: UPPER ESOPHAGEAL ENDOSCOPIC ULTRASOUND (EUS);  Surgeon: Irving Copas., MD;  Location: [facility];  Service: Gastroenterology;  Laterality: N/A;    FAMILY HISTORY: Family History  Problem Relation Age of Onset   Hypertension Mother        deceased 75   Heart attack Father        deceased late 53s    ADVANCED DIRECTIVES (Y/N):  N  HEALTH MAINTENANCE: Social History   Tobacco Use   Smoking status: Never Smoker   Smokeless tobacco: Never Used  Substance Use Topics   Alcohol use: Not Currently   Drug use: Never     Colonoscopy:  PAP:  Bone density:  Lipid panel:  Allergies  Allergen Reactions   Nsaids Other (See Comments)    Decreased GFR   Penicillins     Yeast infection Did it involve swelling of the face/tongue/throat, SOB, or low BP? No Did it involve sudden or severe rash/hives, skin peeling, or any reaction on the inside of your mouth or nose? No Did you need to seek medical attention at a hospital or doctor's office? No When did it last happen?Unknown If all above answers are NO, may proceed with cephalosporin use.      Current Outpatient Medications  Medication Sig Dispense Refill   apixaban (ELIQUIS) 5 MG TABS tablet Take 1 tablet (5 mg total) by mouth 2 (two) times daily. 60 tablet 3   ciprofloxacin (CIPRO) 250 MG tablet Take 1 tablet (250 mg total) by mouth 2 (two) times daily. 28 tablet 0   fentaNYL (DURAGESIC) 75 MCG/HR Place 1 patch onto the skin every 3 (three) days. 5 patch 0   folic acid (FOLVITE) 1 MG tablet TAKE 1 TABLET BY MOUTH EVERY DAY (Patient taking differently: Take 1 mg by mouth daily. ) 90 tablet 1   gabapentin (NEURONTIN) 300 MG capsule Take 1 capsule (300 mg total) by mouth  at bedtime. 90 capsule 0   lidocaine-prilocaine (EMLA) cream Apply 1 application topically as needed. (Patient taking differently: Apply 1 application topically as needed (port access). ) 30 g 2   lipase/protease/amylase (CREON) 36000 UNITS CPEP capsule Take 36,000-72,000 Units by mouth See admin instructions. Take 36000 units with small meals and 72000 units with big meals     loperamide (IMODIUM) 2 MG capsule Take 2 mg by mouth as needed for diarrhea or loose stools.      NARCAN 4 MG/0.1ML LIQD nasal spray kit Place 1 spray into the nose as needed (opioid overdose).      Nutritional Supplements (FEEDING SUPPLEMENT, OSMOLITE 1.2 CAL,) LIQD Give osmolite 1.2 via J-tube using pump at 24m/hr for 20 hours.   Flush with 1247mof water 4 times per day.   Send pump and all tube feeding supplies. 1560 mL 0   oxyCODONE (ROXICODONE) 15 MG immediate release tablet Take 1-2 tablets (15-30 mg total) by mouth every 4 (four) hours as needed for pain. 90 tablet 0   potassium chloride (KLOR-CON SPRINKLE) 10 MEQ CR capsule Take 2 capsules (20 mEq total) by  mouth daily. May open capsule to sprinkle on food. 30 capsule 0   prochlorperazine (COMPAZINE) 5 MG tablet Take 1 tablet (5 mg total) by mouth every 6 (six) hours as needed for nausea or vomiting. 30 tablet 0   promethazine (PHENERGAN) 12.5 MG suppository Place 1 suppository (12.5 mg total) rectally every 6 (six) hours as needed for nausea or vomiting. 12 each 0   promethazine (PHENERGAN) 12.5 MG tablet Take 12.5 mg by mouth every 6 (six) hours as needed for nausea or vomiting.     tiZANidine (ZANAFLEX) 2 MG tablet Take 1 tablet (2 mg total) by mouth every 8 (eight) hours as needed for muscle spasms. 30 tablet 0   No current facility-administered medications for this visit.    Facility-Administered Medications Ordered in Other Visits  Medication Dose Route Frequency Provider Last Rate Last Dose   prochlorperazine (COMPAZINE) injection 10 mg  10 mg  Intravenous Once Rexene Agent        OBJECTIVE: There were no vitals filed for this visit.   There is no height or weight on file to calculate BMI.    ECOG FS:0 - Asymptomatic  General: Thin, ill-appearing. Eyes: Pink conjunctiva, anicteric sclera. HEENT: Normocephalic, moist mucous membranes. Lungs: Clear to auscultation bilaterally. Heart: Regular rate and rhythm. No rubs, murmurs, or gallops. Abdomen: Soft, nontender, nondistended. No organomegaly noted, normoactive bowel sounds. Musculoskeletal: No edema, cyanosis, or clubbing. Neuro: Alert, answering all questions appropriately. Cranial nerves grossly intact. Skin: No rashes or petechiae noted. Psych: Normal affect.  LAB RESULTS:  Lab Results  Component Value Date   NA 141 05/28/2018   K 2.6 (LL) 05/28/2018   CL 120 (H) 05/28/2018   CO2 15 (L) 05/28/2018   GLUCOSE 112 (H) 05/28/2018   BUN 15 05/28/2018   CREATININE 1.18 (H) 05/28/2018   CALCIUM 7.7 (L) 05/28/2018   PROT 6.4 (L) 05/28/2018   ALBUMIN 2.1 (L) 05/28/2018   AST 35 05/28/2018   ALT 28 05/28/2018   ALKPHOS 147 (H) 05/28/2018   BILITOT 0.5 05/28/2018   GFRNONAA 47 (L) 05/28/2018   GFRAA 54 (L) 05/28/2018    Lab Results  Component Value Date   WBC 11.2 (H) 05/28/2018   NEUTROABS 7.6 05/28/2018   HGB 8.3 (L) 05/28/2018   HCT 25.3 (L) 05/28/2018   MCV 91.3 05/28/2018   PLT 283 05/28/2018     STUDIES: Ct Abdomen Wo Contrast  Result Date: 05/20/2018 CLINICAL DATA:  s/p Procedure(s):LAPAROSCOPY DIAGNOSTIC AND J TUBE PLACEMENT (FEEDING TUBE) 3/19/2020Pt c/o N/V today EXAM: CT ABDOMEN WITHOUT CONTRAST TECHNIQUE: Multidetector CT imaging of the abdomen was performed following the standard protocol without IV contrast. COMPARISON:  Radiographs, 05/19/2018. Prior abdomen and pelvis CTs, most recent dated 05/10/2018. FINDINGS: Lower chest: Lower minimal effusions lobe. Dependent lung base opacity mostly in the lower lobes consistent with atelectasis. Both have  improved from the prior CT. Hepatobiliary: Diffusely decreased attenuation of the liver consistent with fatty infiltration. No discrete liver mass. Status post cholecystectomy. No bile duct dilation. Pancreas: Hypoattenuating mass, uncinate process, unchanged from the most recent prior study, less well-defined due to lack of contrast. The large pseudocyst noted along the anterior pancreatic body and tail is significantly decreased in size subsequent to placement of the double pigtail catheter extending from the stomach into the pseudocyst. Cyst now measures approximately 7.7 cm in long axis by 2.0 x 1.4 cm. Spleen: Normal in size without focal abnormality. Adrenals/Urinary Tract: No adrenal masses. Midpole left renal mass, 3.1 cm in  greatest dimension, unchanged from the prior study. No new renal masses, no stones and no hydronephrosis. Visualized ureters are unremarkable. Stomach/Bowel: Double pigtail catheter extends from the mid stomach across the posterior gastric wall into the pancreatic pseudocyst, well-positioned and significantly decompressing the pseudocyst. Apparent wall thickening of the distal stomach, which may be reactive to the underlying pancreatic inflammation. Well-positioned jejunostomy tube stable from the prior CT. Mild distention proximal small bowel loops, maximum diameter 3.7 cm no defined transition point, but the more distal small bowel is decompressed. No small bowel wall thickening. Visualized colon is mostly decompressed. No wall thickening or adjacent inflammation. Vascular/Lymphatic: No aneurysm.  No discrete enlarged lymph nodes. Other: Trace amount of ascites primarily seen adjacent to the liver, increased from the previous CT scan. Musculoskeletal: No osteoblastic or osteolytic lesions. IMPRESSION: 1. Large pseudocyst noted on the prior CT along the anterior margin of the pancreatic body and tail has been significant the decompressed following placement a double pigtail catheter  extending from the stomach into the pseudocyst, through the stomach posterior wall. 2. Trace amount of ascites which is increased compared to the prior CT. 3. Mild interval decrease in pleural effusions and dependent lung base atelectasis since the prior CT. 4. Mild dilation of proximal small bowel. This may reflect an adynamic ileus or a partial obstruction. A discrete transition point is not defined, however. 5. No other changes from the recent prior CT. Pancreatic mass is less well-defined due to lack of intravenous contrast. Left renal mass is stable. Electronically Signed   By: Lajean Manes M.D.   On: 05/20/2018 16:07   Dg Thoracic Spine 2 View  Result Date: 05/01/2018 CLINICAL DATA:  Pancreatic cancer.  Back pain. EXAM: THORACIC SPINE 2 VIEWS COMPARISON:  Chest radiography 03/10/2018 FINDINGS: Very minimal spinal curvature. No significant disc space narrowing. Ordinary marginal osteophytes. No evidence of fracture or destructive lesion. Posteromedial ribs appear normal. IMPRESSION: No definitely significant finding. Ordinary age related changes. Ordinary marginal osteophytes. No sign of fracture or destructive lesion. Electronically Signed   By: Nelson Chimes M.D.   On: 05/01/2018 09:11   Dg Abd 1 View  Result Date: 05/17/2018 CLINICAL DATA:  Nausea.  Cystogastrostomy stent placement EXAM: ABDOMEN - 1 VIEW COMPARISON:  May 11, 2018 FINDINGS: Catheter tip is in the left mid abdomen. There is mild dilatation of the rectum due to fluid and stool. Elsewhere, there is no appreciable bowel dilatation. No air-fluid level. There are surgical clips in the upper abdomen. A drain is noted in the medial left upper abdomen. No free air evident. IMPRESSION: Catheter tip in lateral left mid abdomen region. Drain and left upper quadrant in or overlying the stomach. Rectum distended with fluid and stool. No bowel obstruction or free air demonstrable. Electronically Signed   By: Lowella Grip III M.D.   On:  05/17/2018 08:26   Ct Abdomen Pelvis W Contrast  Result Date: 05/10/2018 CLINICAL DATA:  Stage IV pancreatic cancer. Status post laparoscopy and J-tube placement. EXAM: CT ABDOMEN AND PELVIS WITH CONTRAST TECHNIQUE: Multidetector CT imaging of the abdomen and pelvis was performed using the standard protocol following bolus administration of intravenous contrast. CONTRAST:  178m OMNIPAQUE IOHEXOL 300 MG/ML  SOLN COMPARISON:  04/04/2018 FINDINGS: Lower chest: Patchy bilateral lower lobe opacities, likely atelectasis. Small bilateral pleural effusions. Hepatobiliary: Hepatic steatosis with geographic areas of focal fatty sparing. Possible 5 x 10 mm serosal implant along the anterior surface of the liver inferiorly along segment 3 (series 2/image 46). Status post  cholecystectomy. No intrahepatic or extrahepatic ductal dilatation. Pancreas: Parenchymal atrophy. 2.4 x 3.0 cm hypoenhancing mass in the uncinate process (series 2/image 86), corresponding to the patient's known pancreatic adenocarcinoma, previously 2.4 x 2.8 cm. Adjacent fiducial markers. Large pseudocyst along the pancreatic body/tail measuring 7.2 x 12.7 cm (series 2/image 49), previously 5.0 x 9.8 cm. Additional 4.9 x 6.0 cm complex pseudocyst along the pancreatic tail/splenic hilum (series 2/image 25), previously 3.5 x 2.9 cm. Spleen: Within normal limits. Adrenals/Urinary Tract: Adrenal glands are within normal limits. 2.9 x 2.6 cm enhancing mass in the lateral left upper kidney (series 2/image 83), compatible with solid renal neoplasm, grossly unchanged. Right kidney is within normal limits. No hydronephrosis. Bladder is underdistended but unremarkable. Stomach/Bowel: Stomach is grossly unremarkable. Visualized bowel is notable for a percutaneous jejunostomy in satisfactory position. No evidence of bowel obstruction. Normal appendix (series 4/image 101). Left colonic diverticulosis, without evidence of diverticulitis. Vascular/Lymphatic: No  evidence of abdominal aortic aneurysm. No suspicious abdominopelvic lymphadenopathy. Reproductive: Uterus is within normal limits. No adnexal masses. Other: Small volume perihepatic and pelvic ascites. Mild nondependent gas/free air related to recent laparoscopy. Tiny fat/fluid containing bilateral inguinal hernias, right greater than left. Subcutaneous emphysema/fluid along the left lateral abdominal wall, related to recent laparoscopy. Musculoskeletal: Degenerative changes of the visualized thoracolumbar spine. IMPRESSION: 3.0 cm hypoenhancing mass in the uncinate process, corresponding to the patient's known pancreatic adenocarcinoma, mildly increased. Adjacent fiducial markers. Progressive pseudocysts in the left upper abdomen. Possible new 10 mm serosal implant along the anterior surface of segment 3. 2.9 cm solid renal neoplasm in the left upper kidney, grossly unchanged. Postsurgical changes related to recent laparoscopy, as above. Placement of an indwelling percutaneous jejunostomy in satisfactory position. Electronically Signed   By: Julian Hy M.D.   On: 05/10/2018 14:14   Dg Abd 2 Views  Result Date: 05/19/2018 CLINICAL DATA:  Pancreatic carcinoma, nausea and vomiting. EXAM: ABDOMEN - 2 VIEW COMPARISON:  05/17/2018 FINDINGS: The Axios stent cyst gastrostomy with double pigtail catheter appears stable in position. There is an elongated air-fluid level in the stomach. Jejunostomy tube shows stable positioning. Bowel-gas shows mild prominence of a few small bowel loops which may be consistent with a component of ileus/enteritis. No overt small bowel obstruction identified. No evidence of free intraperitoneal air. IMPRESSION: 1. Stable positioning of cyst gastrostomy stent with traversing double pigtail catheter. There is an elongated air-fluid level in the stomach likely related to fluid distention of the stomach. 2. Mild gaseous prominence of a few small bowel loops which could be consistent with  a component of ileus/enteritis. No overt small bowel obstruction. 3. Stable positioning of jejunostomy tube. Electronically Signed   By: Aletta Edouard M.D.   On: 05/19/2018 11:50   Dg Abd 2 Views  Result Date: 05/11/2018 CLINICAL DATA:  Recent abdominal laparoscopy with J-tube insertion on 05/09/2018. Vomiting status post surgery. History of pancreatic cancer and cholecystectomy. EXAM: ABDOMEN - 2 VIEW COMPARISON:  Plain film of the abdomen dated 04/04/2018. CT abdomen dated 05/10/2018. FINDINGS: Percutaneous jejunostomy catheter overlies the lower abdomen, similar compared to the positioning on CT of 05/10/2018. Overall bowel gas pattern is nonobstructive. No dilated large or small bowel loops seen. No evidence of abnormal fluid collection or free intraperitoneal air seen. Cholecystectomy clips in the RIGHT upper quadrant. No acute appearing osseous abnormality. IMPRESSION: Nonobstructive bowel gas pattern and no evidence of acute intra-abdominal abnormality. Percutaneous jejunostomy catheter overlying the lower abdomen. Electronically Signed   By: Roxy Horseman.D.  On: 05/11/2018 13:00   Ir Radiologist Eval & Mgmt  Result Date: 05/08/2018 Please refer to notes tab for details about interventional procedure. (Op Note)   ASSESSMENT: Clinical stage IV pancreatic adenocarcinoma.  PLAN:    1.  Clinical stage IV pancreatic adenocarcinoma: EUS completed on October 25, 2017 confirmed adenocarcinoma.  Neoadjuvant FOLFIRINOX was too toxic for patient, therefore she was switched to single agent gemcitabine.  Patient's most recent imaging with CT of the abdomen with contrast on May 10, 2018 revealed persistent 3 cm mass in the pancreas as well as progressive pseudocyst.  She was also noted to have a 1 cm serosal implant likely consistent with metastatic disease.  During patient's J-tube placement, it was noted that she had metastatic disease in her liver which was biopsied and proven malignant.  She  continues to have a significantly decreased performance status.  We had a lengthy discussion regarding hospice and end-of-life care versus additional treatment with chemotherapy.  Patient also had palliative care evaluation today.  She is unclear if she wishes to pursue additional treatment or not, but will further discuss with her family this week.  Return to clinic in 1 week for further evaluation as well as IV fluids.   2.  Anemia: Hemoglobin decreased to 8.3.  Monitor.. 3.  Pain: Continue fentanyl patch to 75 mcg every 3 days.  Continue oxycodone and gabapentin.  Patient was previously given referral for interventional pain clinic for consideration of celiac plexus block.  Appreciate palliative care input. 4.  Renal mass: CT scan results as above.  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 No intervention is needed at this time.  Continue to monitor while patient is undergoing treatment for her pancreatic cancer.  Patient was previously given a referral to urology.   4.  Genetic: Genetic testing is pending at time of dictation. 5.  Chronic renal insufficiency: Patient's creatinine is 1.18 today. 6.  Hypokalemia: Potassium is significantly decreased to 2.6.  She received 40 mEq IV potassium in clinic today and has been instructed to continue her oral potassium supplementation. 7.  Diarrhea: Significantly worse after discharge with tube feeds.  Patient was instructed to use Lomotil on a more scheduled basis.  Have also instructed to discontinue Cipro. 8.  Pancreatic pseudocyst: Appreciate surgical input.  Patient was placed on Cipro as a prophylaxis, but this has been discontinued as a possible etiology of her diarrhea. 9.  Weight loss: Appreciate dietary input.  Continue J-tube feedings as directed. 10.  Left lower extremity DVT: Confirmed by ultrasound.  Continue Eliquis as prescribed. 11.  Dehydration: Patient received 2 L IV fluids today.  Return to clinic in 1 week for further evaluation,  additional IV fluids, and further discussion of treatment versus hospice.  Patient expressed understanding and was in agreement with this plan. She also understands that She can call clinic at any time with any questions, concerns, or complaints.   Cancer Staging Malignant neoplasm of head of pancreas [facility]) Staging form: Exocrine Pancreas, AJCC 8th Edition - Clinical stage from 10/26/2017: Stage IB (cT2, cN0, cM0) - Signed by Lloyd Huger, MD on 10/26/2017   Lloyd Huger, MD   05/28/2018 8:09 PM

## 2018-05-27 NOTE — Progress Notes (Signed)
Nutrition Follow-up:  Patient with stage Ib adenocarcinoma of pancreas.  Patient s/p laparaoscopy/J tube placement/ umbilical hernia repair on 3/19 by Dr. Barry Dienes.  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 Noted C-diff completed inpatient.    Son called RD this am and reports patient having diarrhea over the weekend with tube feedings.  Reports started patient at 58ml/hr around 9pm on Thursday, April 2.  Reports that patient had some nausea and dry heaves but more issues with diarrhea.  Reports patient had 6+ times diarrhea and stopped tube feeding at 3 pm on Friday.  Restarted tube feeding at 62ml/hr at 8pm Friday night and patient continued to have diarrhea episodes 7 + into Saturday (1 episode did not make it to the bathroom).  Son reports he stopped tube feeding during the day on Saturday and patient has not received any tube feeding since then.  Reports that diarrhea has stopped since being off tube feeding.    Son reports patient has taken water, broth, [employer] breakfast essentials, potatoes orally over the weekend.      Medications: added at discharge cipro.  Son reports has been giving patient promethazine suppository and compazine for nausea. Has not given patient anything for diarrhea  Labs: no new  Anthropometrics:   No new   Estimated Energy Needs  Kcals: 1620-1890 calories Protein: 81-94 g Fluid: 1.8  NUTRITION DIAGNOSIS: Malnutrition continues   MALNUTRITION DIAGNOSIS: severe malnutrition continues   INTERVENTION:  Spoke with Dr. Grayland Ormond and Merrily Pew, Palliative NP regarding adding medication for diarrhea.  Josh, NP with follow-up with son regarding starting Imodium OTC.   Spoke with son regarding restarting tube feeding back at 50ml/hr for 24 hr today and giving imodium.  RD will leave osmolite 1.5 formula for son to pick up at MD visit tomorrow to try at 96ml/hr for 24 hours to better meet calorie needs. With osmolite 1.5 at  50 ml/hr for 24 hours will provide 1800 calories, 75 g of protein.  Water flush will need to be 167ml QID to provide 1632 ml free water (including free water from formula).  Patient will need to continue to drink fluids orally.  Discussed with son and will provide written instructions for son to pick up tomorrow with new formula.  Son verbalized understanding.      MONITORING, EVALUATION, GOAL: weight trends, intake, TF tolerance   NEXT VISIT: phone call on Thursday, April 9, son has contact information if needed before then  Yvonnie Schinke B. Zenia Resides, [place], [place] Registered Dietitian [phone] (pager)

## 2018-05-27 NOTE — Telephone Encounter (Signed)
Spoke with patient's son by phone. Patient was having loose stools with tube feeding. Rate was slowed but diarrhea persisted. Diarrhea improved when patient stopped tube feeds. Case discussed with RD and Dr. Grayland Ormond. Patient can try OTC Imodium with max daily dose of 16mg /24 hours. She will see Dr. Grayland Ormond tomorrow in the clinic.

## 2018-05-28 ENCOUNTER — Telehealth: Payer: Self-pay

## 2018-05-28 ENCOUNTER — Inpatient Hospital Stay: Payer: Medicare Other

## 2018-05-28 ENCOUNTER — Inpatient Hospital Stay (HOSPITAL_BASED_OUTPATIENT_CLINIC_OR_DEPARTMENT_OTHER): Payer: Medicare Other | Admitting: Oncology

## 2018-05-28 ENCOUNTER — Other Ambulatory Visit: Payer: Self-pay

## 2018-05-28 ENCOUNTER — Inpatient Hospital Stay (HOSPITAL_BASED_OUTPATIENT_CLINIC_OR_DEPARTMENT_OTHER): Payer: Medicare Other | Admitting: Hospice and Palliative Medicine

## 2018-05-28 VITALS — BP 137/84 | HR 91 | Temp 96.3°F | Resp 20

## 2018-05-28 DIAGNOSIS — C787 Secondary malignant neoplasm of liver and intrahepatic bile duct: Secondary | ICD-10-CM

## 2018-05-28 DIAGNOSIS — G893 Neoplasm related pain (acute) (chronic): Secondary | ICD-10-CM

## 2018-05-28 DIAGNOSIS — E876 Hypokalemia: Secondary | ICD-10-CM

## 2018-05-28 DIAGNOSIS — Z931 Gastrostomy status: Secondary | ICD-10-CM

## 2018-05-28 DIAGNOSIS — R197 Diarrhea, unspecified: Secondary | ICD-10-CM

## 2018-05-28 DIAGNOSIS — K863 Pseudocyst of pancreas: Secondary | ICD-10-CM

## 2018-05-28 DIAGNOSIS — C259 Malignant neoplasm of pancreas, unspecified: Secondary | ICD-10-CM

## 2018-05-28 DIAGNOSIS — E860 Dehydration: Secondary | ICD-10-CM

## 2018-05-28 DIAGNOSIS — N289 Disorder of kidney and ureter, unspecified: Secondary | ICD-10-CM | POA: Diagnosis not present

## 2018-05-28 DIAGNOSIS — R110 Nausea: Secondary | ICD-10-CM

## 2018-05-28 DIAGNOSIS — D649 Anemia, unspecified: Secondary | ICD-10-CM

## 2018-05-28 DIAGNOSIS — N179 Acute kidney failure, unspecified: Secondary | ICD-10-CM

## 2018-05-28 DIAGNOSIS — Z515 Encounter for palliative care: Secondary | ICD-10-CM | POA: Diagnosis not present

## 2018-05-28 DIAGNOSIS — R634 Abnormal weight loss: Secondary | ICD-10-CM

## 2018-05-28 DIAGNOSIS — C250 Malignant neoplasm of head of pancreas: Secondary | ICD-10-CM

## 2018-05-28 DIAGNOSIS — R531 Weakness: Secondary | ICD-10-CM

## 2018-05-28 DIAGNOSIS — R630 Anorexia: Secondary | ICD-10-CM

## 2018-05-28 DIAGNOSIS — Z66 Do not resuscitate: Secondary | ICD-10-CM

## 2018-05-28 DIAGNOSIS — R5383 Other fatigue: Secondary | ICD-10-CM

## 2018-05-28 LAB — CBC WITH DIFFERENTIAL/PLATELET
Abs Immature Granulocytes: 0.07 10*3/uL (ref 0.00–0.07)
Basophils Absolute: 0 10*3/uL (ref 0.0–0.1)
Basophils Relative: 0 %
Eosinophils Absolute: 0.1 10*3/uL (ref 0.0–0.5)
Eosinophils Relative: 1 %
HCT: 25.3 % — ABNORMAL LOW (ref 36.0–46.0)
Hemoglobin: 8.3 g/dL — ABNORMAL LOW (ref 12.0–15.0)
Immature Granulocytes: 1 %
Lymphocytes Relative: 21 %
Lymphs Abs: 2.4 10*3/uL (ref 0.7–4.0)
MCH: 30 pg (ref 26.0–34.0)
MCHC: 32.8 g/dL (ref 30.0–36.0)
MCV: 91.3 fL (ref 80.0–100.0)
Monocytes Absolute: 1 10*3/uL (ref 0.1–1.0)
Monocytes Relative: 9 %
Neutro Abs: 7.6 10*3/uL (ref 1.7–7.7)
Neutrophils Relative %: 68 %
Platelets: 283 10*3/uL (ref 150–400)
RBC: 2.77 MIL/uL — ABNORMAL LOW (ref 3.87–5.11)
RDW: 19.1 % — ABNORMAL HIGH (ref 11.5–15.5)
WBC: 11.2 10*3/uL — ABNORMAL HIGH (ref 4.0–10.5)
nRBC: 0 % (ref 0.0–0.2)

## 2018-05-28 LAB — COMPREHENSIVE METABOLIC PANEL
ALT: 28 U/L (ref 0–44)
AST: 35 U/L (ref 15–41)
Albumin: 2.1 g/dL — ABNORMAL LOW (ref 3.5–5.0)
Alkaline Phosphatase: 147 U/L — ABNORMAL HIGH (ref 38–126)
Anion gap: 6 (ref 5–15)
BUN: 15 mg/dL (ref 8–23)
CO2: 15 mmol/L — ABNORMAL LOW (ref 22–32)
Calcium: 7.7 mg/dL — ABNORMAL LOW (ref 8.9–10.3)
Chloride: 120 mmol/L — ABNORMAL HIGH (ref 98–111)
Creatinine, Ser: 1.18 mg/dL — ABNORMAL HIGH (ref 0.44–1.00)
GFR calc Af Amer: 54 mL/min — ABNORMAL LOW (ref >60)
GFR calc non Af Amer: 47 mL/min — ABNORMAL LOW (ref >60)
Glucose, Bld: 112 mg/dL — ABNORMAL HIGH (ref 70–99)
Potassium: 2.6 mmol/L — CL (ref 3.5–5.1)
Sodium: 141 mmol/L (ref 135–145)
Total Bilirubin: 0.5 mg/dL (ref 0.3–1.2)
Total Protein: 6.4 g/dL — ABNORMAL LOW (ref 6.5–8.1)

## 2018-05-28 LAB — AMYLASE: Amylase: 33 U/L (ref 28–100)

## 2018-05-28 LAB — MAGNESIUM: Magnesium: 1.7 mg/dL (ref 1.7–2.4)

## 2018-05-28 MED ORDER — SODIUM CHLORIDE 0.9 % IV SOLN
Freq: Once | INTRAVENOUS | Status: AC
  Administered 2018-05-28: 13:00:00 via INTRAVENOUS
  Filled 2018-05-28: qty 1000

## 2018-05-28 MED ORDER — ONDANSETRON HCL 4 MG/2ML IJ SOLN
4.0000 mg | Freq: Once | INTRAMUSCULAR | Status: AC
  Administered 2018-05-28: 4 mg via INTRAVENOUS
  Filled 2018-05-28: qty 2

## 2018-05-28 MED ORDER — HEPARIN SOD (PORK) LOCK FLUSH 100 UNIT/ML IV SOLN
INTRAVENOUS | Status: AC
  Filled 2018-05-28: qty 5

## 2018-05-28 MED ORDER — SODIUM CHLORIDE 0.9 % IV SOLN
Freq: Once | INTRAVENOUS | Status: AC
  Administered 2018-05-28: 12:00:00 via INTRAVENOUS
  Filled 2018-05-28: qty 1000

## 2018-05-28 MED ORDER — HEPARIN SOD (PORK) LOCK FLUSH 100 UNIT/ML IV SOLN
500.0000 [IU] | Freq: Once | INTRAVENOUS | Status: AC
  Administered 2018-05-28: 500 [IU] via INTRAVENOUS

## 2018-05-28 MED ORDER — ONDANSETRON HCL 4 MG/2ML IJ SOLN
INTRAMUSCULAR | Status: AC
  Filled 2018-05-28: qty 2

## 2018-05-28 MED ORDER — SODIUM CHLORIDE 0.9% FLUSH
10.0000 mL | Freq: Once | INTRAVENOUS | Status: AC
  Administered 2018-05-28: 11:00:00 10 mL via INTRAVENOUS
  Filled 2018-05-28: qty 10

## 2018-05-28 MED ORDER — ONDANSETRON HCL 4 MG/2ML IJ SOLN
4.0000 mg | Freq: Once | INTRAMUSCULAR | Status: AC
  Administered 2018-05-28: 11:00:00 4 mg via INTRAVENOUS

## 2018-05-28 NOTE — Progress Notes (Signed)
Patient to be seen in infusion today.

## 2018-05-28 NOTE — Telephone Encounter (Signed)
Nutrition  RD working remotely.   Called son, Rinell for nutrition follow-up regarding J-tube feeding.  Notes from today's clinic visit reviewed.  Son reports gave patient does of imodium and then started tube feeding of osmolite 1.2 at 46ml/hr about 6:30pm last night reports at about 10pm patient had mushy stool and then up about every hour until 2 am with liquid stool and abdominal pain.  Turned tube feeding off.  Imodium was given several times during the feeding per son.    Labs: K 2.6 (supplemented),glucoses 112, creatinine 1.18    Medications: imodium, KCL. Son reports MD is planning to stop antibiotic.    No new weight noted today  Intervention: Recommend banatrol plus can be given orally or via J-tube to help with diarrhea.   Will proceed with trying osmolite 1.5 later today and continue imodium. Discussed option of trying vital 1.5 (peptide-based formula for malabsorption and maldigestion) if symptoms don't improve with current treatment.   Son agreeable with current plan.    Next visit: phone follow-up with son, Wednesday April 8  Sheldon Sem B. Zenia Resides, [place], [place] Registered Dietitian [phone] (pager)

## 2018-05-28 NOTE — Progress Notes (Signed)
[place]  Telephone:([phone] [phone]   Name: Cindy Bowman Date: 05/28/2018 MRN: [id number]  DOB: Jul 04, 1950  Patient Care Team: Glendon Axe, MD as PCP - General (Internal Medicine) Clent Jacks, RN as Registered Nurse Lloyd Huger, MD as Medical Oncologist (Medical Oncology)    REASON FOR CONSULTATION: Palliative Care consult requested for this68 y.o.femalewith multiple medical problems including  adenocarcinoma of the pancreas (diagnosed 09/2017) s/p neoadjuvant FOLFIRINOX. Patient also found to have a L. Renal mass suspicious for RCC. Patientdeveloped necrotizing pancreatitis after fiduciary placement procedure at [facility] which was subsequently managed conservatively. She was hospitalized2/14/20 to 04/09/18 with intractable abdominal pain. CT scan revealed large pancreatic pseudocyst without evidence of further necrosis. She was  Managed conservatively.Patient was hospitalized again 05/09/18 to 05/23/18 with gastric outlet obstruction and an enlarging pancreatic pseudocyst. 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 Patient has been referred to palliative care to help with symptom management andaddress treatmentgoals.  SOCIAL HISTORY:    Patient is divorced.  She has one son with whom she lives.  Patient moved here from [place].  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  ADVANCE DIRECTIVES:  Patient says her son is her healthcare power of attorney.  Patient says she has a living will.  CODE STATUS: DNR (MOST completed on 12/01/18)  PAST MEDICAL HISTORY: Past Medical History:  Diagnosis Date   Anemia    Complication of anesthesia    Difficult airway for intubation    2018 Oophorectomy, CRNA unable to visualized vocal cords with MAC 3 bladed. Easy ventilations O2 sats 99%. Rolls placed under the shoulders and pillows under the head  Repeat Laryngoscopy with MAC 3 blade > Visualization not much better. O2 sats 99%. Intubation with Glidescope on the Second Attempt. BBSE good ETCO 2 O 2 sats 99%.    Essential hypertension    Pancreatic cancer (HCC)    PONV (postoperative nausea and vomiting)    UTI (urinary tract infection)     PAST SURGICAL HISTORY:  Past Surgical History:  Procedure Laterality Date   BIOPSY  05/16/2018   Procedure: BIOPSY;  Surgeon: Rush Landmark, Telford Nab., MD;  Location: [facility];  Service: Gastroenterology;;   CESAREAN SECTION     CHOLECYSTECTOMY     ESOPHAGOGASTRODUODENOSCOPY (EGD) WITH PROPOFOL N/A 05/16/2018   Procedure: ESOPHAGOGASTRODUODENOSCOPY (EGD) WITH PROPOFOL;  Surgeon: Irving Copas., MD;  Location: [facility];  Service: Gastroenterology;  Laterality: N/A;   EUS N/A 10/25/2017   Procedure: FULL UPPER ENDOSCOPIC ULTRASOUND (EUS) RADIAL;  Surgeon: Jola Schmidt, MD;  Location: [facility];  Service: Endoscopy;  Laterality: N/A;   IR RADIOLOGIST EVAL & MGMT  05/08/2018   LAPAROSCOPY N/A 05/09/2018   Procedure: LAPAROSCOPY DIAGNOSTIC AND J TUBE PLACEMENT (FEEDING TUBE);  Surgeon: Stark Klein, MD;  Location: [facility];  Service: General;  Laterality: N/A;   OOPHORECTOMY     PANCREATIC STENT PLACEMENT N/A 05/16/2018   Procedure: Zara Chess PLACEMENT;  Surgeon: Irving Copas., MD;  Location: [facility];  Service: Gastroenterology;  Laterality: N/A;  Hobbs Double Pigtail stent placed Balloon Dialtion of Cyst opening Cautery used to open cyst wall   PORTA CATH INSERTION N/A 11/12/2017   Procedure: PORTA CATH INSERTION;  Surgeon: Algernon Huxley, MD;  Location: [facility];  Service: Cardiovascular;  Laterality: N/A;   TUBAL LIGATION     UPPER ESOPHAGEAL ENDOSCOPIC ULTRASOUND (EUS) N/A 05/16/2018  Procedure: UPPER ESOPHAGEAL ENDOSCOPIC ULTRASOUND (EUS);  Surgeon: Irving Copas., MD;  Location: [facility];  Service: Gastroenterology;   Laterality: N/A;    HEMATOLOGY/ONCOLOGY HISTORY:    Malignant neoplasm of head of pancreas ([place])   10/18/2017 Initial Diagnosis    Pancreatic adenocarcinoma ([place])    10/26/2017 Cancer Staging    Staging form: Exocrine Pancreas, AJCC 8th Edition - Clinical stage from 10/26/2017: Stage IB (cT2, cN0, cM0) - Signed by Lloyd Huger, MD on 10/26/2017    11/06/2017 - 12/11/2017 Chemotherapy    The patient had palonosetron (ALOXI) injection 0.25 mg, 0.25 mg, Intravenous,  Once, 2 of 4 cycles Administration: 0.25 mg (11/14/2017), 0.25 mg (11/28/2017) irinotecan (CAMPTOSAR) 340 mg in dextrose 5 % 500 mL chemo infusion, 180 mg/m2 = 340 mg, Intravenous,  Once, 2 of 4 cycles Dose modification: 160 mg/m2 (original dose 180 mg/m2, Cycle 2, Reason: Dose not tolerated) Administration: 340 mg (11/14/2017), 300 mg (11/28/2017) leucovorin 750 mg in dextrose 5 % 250 mL infusion, 760 mg, Intravenous,  Once, 2 of 4 cycles Dose modification: 360 mg/m2 (original dose 400 mg/m2, Cycle 2, Reason: Dose not tolerated) Administration: 750 mg (11/14/2017), 700 mg (11/28/2017) oxaliplatin (ELOXATIN) 150 mg in dextrose 5 % 500 mL chemo infusion, 160 mg, Intravenous,  Once, 2 of 4 cycles Dose modification: 70 mg/m2 (original dose 85 mg/m2, Cycle 2, Reason: Dose not tolerated) Administration: 150 mg (11/14/2017), 135 mg (11/28/2017) fluorouracil (ADRUCIL) chemo injection 750 mg, 400 mg/m2 = 750 mg, Intravenous,  Once, 2 of 4 cycles Dose modification: 360 mg/m2 (original dose 400 mg/m2, Cycle 2, Reason: Dose not tolerated) Administration: 750 mg (11/14/2017), 700 mg (11/28/2017) fluorouracil (ADRUCIL) 4,550 mg in sodium chloride 0.9 % 59 mL chemo infusion, 2,400 mg/m2 = 4,550 mg, Intravenous, 1 Day/Dose, 2 of 4 cycles Dose modification: 2,000 mg/m2 (original dose 2,400 mg/m2, Cycle 2, Reason: Dose not tolerated) Administration: 4,550 mg (11/14/2017), 3,800 mg (11/28/2017)  for chemotherapy treatment.     12/26/2017 -  Chemotherapy      The patient had gemcitabine (GEMZAR) 1,800 mg in sodium chloride 0.9 % 250 mL chemo infusion, 1,786 mg, Intravenous,  Once, 3 of 4 cycles Administration: 1,800 mg (12/26/2017), 1,800 mg (01/02/2018), 1,800 mg (01/09/2018), 1,800 mg (01/23/2018), 1,800 mg (01/30/2018)  for chemotherapy treatment.      ALLERGIES:  is allergic to nsaids and penicillins.  MEDICATIONS:  Current Outpatient Medications  Medication Sig Dispense Refill   apixaban (ELIQUIS) 5 MG TABS tablet Take 1 tablet (5 mg total) by mouth 2 (two) times daily. 60 tablet 3   ciprofloxacin (CIPRO) 250 MG tablet Take 1 tablet (250 mg total) by mouth 2 (two) times daily. 28 tablet 0   fentaNYL (DURAGESIC) 75 MCG/HR Place 1 patch onto the skin every 3 (three) days. 5 patch 0   folic acid (FOLVITE) 1 MG tablet TAKE 1 TABLET BY MOUTH EVERY DAY (Patient taking differently: Take 1 mg by mouth daily. ) 90 tablet 1   gabapentin (NEURONTIN) 300 MG capsule Take 1 capsule (300 mg total) by mouth at bedtime. 90 capsule 0   lidocaine-prilocaine (EMLA) cream Apply 1 application topically as needed. (Patient taking differently: Apply 1 application topically as needed (port access). ) 30 g 2   lipase/protease/amylase (CREON) 36000 UNITS CPEP capsule Take 36,000-72,000 Units by mouth See admin instructions. Take 36000 units with small meals and 72000 units with big meals     loperamide (IMODIUM) 2 MG capsule Take 2 mg by mouth as needed for diarrhea  or loose stools.      NARCAN 4 MG/0.1ML LIQD nasal spray kit Place 1 spray into the nose as needed (opioid overdose).      Nutritional Supplements (FEEDING SUPPLEMENT, OSMOLITE 1.2 CAL,) LIQD Give osmolite 1.2 via J-tube using pump at 42m/hr for 20 hours.   Flush with 1233mof water 4 times per day.   Send pump and all tube feeding supplies. 1560 mL 0   oxyCODONE (ROXICODONE) 15 MG immediate release tablet Take 1-2 tablets (15-30 mg total) by mouth every 4 (four) hours as needed for pain. 90  tablet 0   potassium chloride (KLOR-CON SPRINKLE) 10 MEQ CR capsule Take 2 capsules (20 mEq total) by mouth daily. May open capsule to sprinkle on food. 30 capsule 0   prochlorperazine (COMPAZINE) 5 MG tablet Take 1 tablet (5 mg total) by mouth every 6 (six) hours as needed for nausea or vomiting. 30 tablet 0   promethazine (PHENERGAN) 12.5 MG suppository Place 1 suppository (12.5 mg total) rectally every 6 (six) hours as needed for nausea or vomiting. 12 each 0   promethazine (PHENERGAN) 12.5 MG tablet Take 12.5 mg by mouth every 6 (six) hours as needed for nausea or vomiting.     tiZANidine (ZANAFLEX) 2 MG tablet Take 1 tablet (2 mg total) by mouth every 8 (eight) hours as needed for muscle spasms. 30 tablet 0   No current facility-administered medications for this visit.    Facility-Administered Medications Ordered in Other Visits  Medication Dose Route Frequency Provider Last Rate Last Dose   prochlorperazine (COMPAZINE) injection 10 mg  10 mg Intravenous Once RoRexene Agent     sodium chloride flush (NS) 0.9 % injection 10 mL  10 mL Intravenous Once FiLloyd HugerMD        VITAL SIGNS: There were no vitals taken for this visit. There were no vitals filed for this visit.  Estimated body mass index is 22.18 kg/m as calculated from the following:   Height as of 05/16/18: _0  (1.6 m).   Weight as of 05/22/18: 125 lb 3.5 oz (56.8 kg).  LABS: CBC:    Component Value Date/Time   WBC 11.1 (H) 05/22/2018 0451   HGB 7.9 (L) 05/22/2018 0451   HCT 25.1 (L) 05/22/2018 0451   PLT 233 05/22/2018 0451   MCV 98.0 05/22/2018 0451   NEUTROABS 10.6 (H) 05/17/2018 1439   LYMPHSABS 1.6 05/17/2018 1439   MONOABS 1.6 (H) 05/17/2018 1439   EOSABS 0.1 05/17/2018 1439   BASOSABS 0.0 05/17/2018 1439   Comprehensive Metabolic Panel:    Component Value Date/Time   NA 139 05/22/2018 0451   K 3.5 05/22/2018 0451   CL 121 (H) 05/22/2018 0451   CO2 14 (L) 05/22/2018 0451   BUN 18  05/22/2018 0451   CREATININE 1.49 (H) 05/22/2018 0451   GLUCOSE 136 (H) 05/22/2018 0451   CALCIUM 7.2 (L) 05/22/2018 0451   AST 18 05/12/2018 0412   ALT 14 05/12/2018 0412   ALKPHOS 82 05/12/2018 0412   BILITOT 0.5 05/12/2018 0412   PROT 6.8 05/12/2018 0412   ALBUMIN 2.4 (L) 05/12/2018 0412    RADIOGRAPHIC STUDIES: Ct Abdomen Wo Contrast  Result Date: 05/20/2018 CLINICAL DATA:  s/p Procedure(s):LAPAROSCOPY DIAGNOSTIC AND J TUBE PLACEMENT (FEEDING TUBE) 3/19/2020Pt c/o N/V today EXAM: CT ABDOMEN WITHOUT CONTRAST TECHNIQUE: Multidetector CT imaging of the abdomen was performed following the standard protocol without IV contrast. COMPARISON:  Radiographs, 05/19/2018. Prior abdomen and pelvis CTs, most  recent dated 05/10/2018. FINDINGS: Lower chest: Lower minimal effusions lobe. Dependent lung base opacity mostly in the lower lobes consistent with atelectasis. Both have improved from the prior CT. Hepatobiliary: Diffusely decreased attenuation of the liver consistent with fatty infiltration. No discrete liver mass. Status post cholecystectomy. No bile duct dilation. Pancreas: Hypoattenuating mass, uncinate process, unchanged from the most recent prior study, less well-defined due to lack of contrast. The large pseudocyst noted along the anterior pancreatic body and tail is significantly decreased in size subsequent to placement of the double pigtail catheter extending from the stomach into the pseudocyst. Cyst now measures approximately 7.7 cm in long axis by 2.0 x 1.4 cm. Spleen: Normal in size without focal abnormality. Adrenals/Urinary Tract: No adrenal masses. Midpole left renal mass, 3.1 cm in greatest dimension, unchanged from the prior study. No new renal masses, no stones and no hydronephrosis. Visualized ureters are unremarkable. Stomach/Bowel: Double pigtail catheter extends from the mid stomach across the posterior gastric wall into the pancreatic pseudocyst, well-positioned and significantly  decompressing the pseudocyst. Apparent wall thickening of the distal stomach, which may be reactive to the underlying pancreatic inflammation. Well-positioned jejunostomy tube stable from the prior CT. Mild distention proximal small bowel loops, maximum diameter 3.7 cm no defined transition point, but the more distal small bowel is decompressed. No small bowel wall thickening. Visualized colon is mostly decompressed. No wall thickening or adjacent inflammation. Vascular/Lymphatic: No aneurysm.  No discrete enlarged lymph nodes. Other: Trace amount of ascites primarily seen adjacent to the liver, increased from the previous CT scan. Musculoskeletal: No osteoblastic or osteolytic lesions. IMPRESSION: 1. Large pseudocyst noted on the prior CT along the anterior margin of the pancreatic body and tail has been significant the decompressed following placement a double pigtail catheter extending from the stomach into the pseudocyst, through the stomach posterior wall. 2. Trace amount of ascites which is increased compared to the prior CT. 3. Mild interval decrease in pleural effusions and dependent lung base atelectasis since the prior CT. 4. Mild dilation of proximal small bowel. This may reflect an adynamic ileus or a partial obstruction. A discrete transition point is not defined, however. 5. No other changes from the recent prior CT. Pancreatic mass is less well-defined due to lack of intravenous contrast. Left renal mass is stable. Electronically Signed   By: Lajean Manes M.D.   On: 05/20/2018 16:07   Dg Thoracic Spine 2 View  Result Date: 05/01/2018 CLINICAL DATA:  Pancreatic cancer.  Back pain. EXAM: THORACIC SPINE 2 VIEWS COMPARISON:  Chest radiography 03/10/2018 FINDINGS: Very minimal spinal curvature. No significant disc space narrowing. Ordinary marginal osteophytes. No evidence of fracture or destructive lesion. Posteromedial ribs appear normal. IMPRESSION: No definitely significant finding. Ordinary age  related changes. Ordinary marginal osteophytes. No sign of fracture or destructive lesion. Electronically Signed   By: Nelson Chimes M.D.   On: 05/01/2018 09:11   Dg Abd 1 View  Result Date: 05/17/2018 CLINICAL DATA:  Nausea.  Cystogastrostomy stent placement EXAM: ABDOMEN - 1 VIEW COMPARISON:  May 11, 2018 FINDINGS: Catheter tip is in the left mid abdomen. There is mild dilatation of the rectum due to fluid and stool. Elsewhere, there is no appreciable bowel dilatation. No air-fluid level. There are surgical clips in the upper abdomen. A drain is noted in the medial left upper abdomen. No free air evident. IMPRESSION: Catheter tip in lateral left mid abdomen region. Drain and left upper quadrant in or overlying the stomach. Rectum distended with fluid and  stool. No bowel obstruction or free air demonstrable. Electronically Signed   By: Lowella Grip III M.D.   On: 05/17/2018 08:26   Ct Abdomen Pelvis W Contrast  Result Date: 05/10/2018 CLINICAL DATA:  Stage IV pancreatic cancer. Status post laparoscopy and J-tube placement. EXAM: CT ABDOMEN AND PELVIS WITH CONTRAST TECHNIQUE: Multidetector CT imaging of the abdomen and pelvis was performed using the standard protocol following bolus administration of intravenous contrast. CONTRAST:  167m OMNIPAQUE IOHEXOL 300 MG/ML  SOLN COMPARISON:  04/04/2018 FINDINGS: Lower chest: Patchy bilateral lower lobe opacities, likely atelectasis. Small bilateral pleural effusions. Hepatobiliary: Hepatic steatosis with geographic areas of focal fatty sparing. Possible 5 x 10 mm serosal implant along the anterior surface of the liver inferiorly along segment 3 (series 2/image 46). Status post cholecystectomy. No intrahepatic or extrahepatic ductal dilatation. Pancreas: Parenchymal atrophy. 2.4 x 3.0 cm hypoenhancing mass in the uncinate process (series 2/image 86), corresponding to the patient's known pancreatic adenocarcinoma, previously 2.4 x 2.8 cm. Adjacent fiducial  markers. Large pseudocyst along the pancreatic body/tail measuring 7.2 x 12.7 cm (series 2/image 49), previously 5.0 x 9.8 cm. Additional 4.9 x 6.0 cm complex pseudocyst along the pancreatic tail/splenic hilum (series 2/image 25), previously 3.5 x 2.9 cm. Spleen: Within normal limits. Adrenals/Urinary Tract: Adrenal glands are within normal limits. 2.9 x 2.6 cm enhancing mass in the lateral left upper kidney (series 2/image 83), compatible with solid renal neoplasm, grossly unchanged. Right kidney is within normal limits. No hydronephrosis. Bladder is underdistended but unremarkable. Stomach/Bowel: Stomach is grossly unremarkable. Visualized bowel is notable for a percutaneous jejunostomy in satisfactory position. No evidence of bowel obstruction. Normal appendix (series 4/image 101). Left colonic diverticulosis, without evidence of diverticulitis. Vascular/Lymphatic: No evidence of abdominal aortic aneurysm. No suspicious abdominopelvic lymphadenopathy. Reproductive: Uterus is within normal limits. No adnexal masses. Other: Small volume perihepatic and pelvic ascites. Mild nondependent gas/free air related to recent laparoscopy. Tiny fat/fluid containing bilateral inguinal hernias, right greater than left. Subcutaneous emphysema/fluid along the left lateral abdominal wall, related to recent laparoscopy. Musculoskeletal: Degenerative changes of the visualized thoracolumbar spine. IMPRESSION: 3.0 cm hypoenhancing mass in the uncinate process, corresponding to the patient's known pancreatic adenocarcinoma, mildly increased. Adjacent fiducial markers. Progressive pseudocysts in the left upper abdomen. Possible new 10 mm serosal implant along the anterior surface of segment 3. 2.9 cm solid renal neoplasm in the left upper kidney, grossly unchanged. Postsurgical changes related to recent laparoscopy, as above. Placement of an indwelling percutaneous jejunostomy in satisfactory position. Electronically Signed   By:  SJulian HyM.D.   On: 05/10/2018 14:14   Dg Abd 2 Views  Result Date: 05/19/2018 CLINICAL DATA:  Pancreatic carcinoma, nausea and vomiting. EXAM: ABDOMEN - 2 VIEW COMPARISON:  05/17/2018 FINDINGS: The Axios stent cyst gastrostomy with double pigtail catheter appears stable in position. There is an elongated air-fluid level in the stomach. Jejunostomy tube shows stable positioning. Bowel-gas shows mild prominence of a few small bowel loops which may be consistent with a component of ileus/enteritis. No overt small bowel obstruction identified. No evidence of free intraperitoneal air. IMPRESSION: 1. Stable positioning of cyst gastrostomy stent with traversing double pigtail catheter. There is an elongated air-fluid level in the stomach likely related to fluid distention of the stomach. 2. Mild gaseous prominence of a few small bowel loops which could be consistent with a component of ileus/enteritis. No overt small bowel obstruction. 3. Stable positioning of jejunostomy tube. Electronically Signed   By: GAletta EdouardM.D.   On: 05/19/2018  11:50   Dg Abd 2 Views  Result Date: 05/11/2018 CLINICAL DATA:  Recent abdominal laparoscopy with J-tube insertion on 05/09/2018. Vomiting status post surgery. History of pancreatic cancer and cholecystectomy. EXAM: ABDOMEN - 2 VIEW COMPARISON:  Plain film of the abdomen dated 04/04/2018. CT abdomen dated 05/10/2018. FINDINGS: Percutaneous jejunostomy catheter overlies the lower abdomen, similar compared to the positioning on CT of 05/10/2018. Overall bowel gas pattern is nonobstructive. No dilated large or small bowel loops seen. No evidence of abnormal fluid collection or free intraperitoneal air seen. Cholecystectomy clips in the RIGHT upper quadrant. No acute appearing osseous abnormality. IMPRESSION: Nonobstructive bowel gas pattern and no evidence of acute intra-abdominal abnormality. Percutaneous jejunostomy catheter overlying the lower abdomen. Electronically  Signed   By: Franki Cabot M.D.   On: 05/11/2018 13:00   Ir Radiologist Eval & Mgmt  Result Date: 05/08/2018 Please refer to notes tab for details about interventional procedure. (Op Note)   PERFORMANCE STATUS (ECOG) : 3 - Symptomatic, >50% confined to bed  Review of Systems Unless otherwise noted, a complete review of systems is negative.  Physical Exam General: NAD, frail appearing, thin Pulmonary: unlabored Abdomen: J tube noted but not visualized  Skin: no rashes Neurological: Weakness but otherwise nonfocal  IMPRESSION: Patient was seen for posthospitalization follow-up.  Patient says she was aware that she now has stage IV cancer, which is incurable and will eventually result in her death.  We had a goals of care conversation regarding future treatment versus a transition to less aggressive options.  We discussed the option of hospice at length.  Patient says she is uncertain what she wants at this point. She would like her son to be involved in decision-making.  Patient says that she is worried about being a burden to her son.  Note that patient's two grandsons (age 63 and 2) are also living in the home. I recommended consideration of the [place] program.   Patient would like to speak with Dr. Grayland Ormond about options and then talk more with her son prior to making any decisions.   PLAN: -Best supportive care -Patient to decide if she wants to pursue further treatment vs transition to hospice care at home -DNR   Patient expressed understanding and was in agreement with this plan. She also understands that She can call clinic at any time with any questions, concerns, or complaints.     Time Total: 45 minutes  Visit consisted of counseling and education dealing with the complex and emotionally intense issues of symptom management and palliative care in the setting of serious and potentially life-threatening illness.Greater than 50%  of this time was spent counseling and  coordinating care related to the above assessment and plan.  Signed by: Altha Harm, PhD, NP-C [phone] (Work Cell)

## 2018-05-29 ENCOUNTER — Telehealth: Payer: Self-pay

## 2018-05-29 ENCOUNTER — Other Ambulatory Visit: Payer: Self-pay

## 2018-05-29 NOTE — Progress Notes (Signed)
   Radiation Oncology         [phone] ________________________________  Name: Cindy Bowman MRN: [id number]  Date: 04/19/2018  DOB: [birth year]  End of Treatment Note  Diagnosis:   pancreatic cancer     Indication for treatment::  curative       Radiation treatment dates:   04/09/18 - 2/28-20  Site/dose:   the abdomen was treated to a dose of 33 Gy in 5 fractions, corresponding to a course of SBRT.  Narrative: The patient tolerated radiation treatment relatively well.   No major issues of nausea, diarrhea.  Plan: The patient has completed radiation treatment. The patient will return to radiation oncology clinic for routine followup in one month. I advised the patient to call or return sooner if they have any questions or concerns related to their recovery or treatment. ________________________________  Jodelle Gross, M.D., Ph.D.

## 2018-05-29 NOTE — Telephone Encounter (Signed)
Dr. Rush Landmark was requesting a copy of patient's labs from yesterday. This was faxed at [phone].

## 2018-05-29 NOTE — Telephone Encounter (Signed)
Nutrition  Called son, Cindy Bowman for nutrition follow-up regarding tube feeding.  Son planning to restart tube feeding this afternoon, wanted to try not giving her any tube feeding for 24 hours after giving last dose of antibiotic.  Son reports patient had mushy stool after oncology appointment yesterday after stopping tube feeding and unsure if she has had a bowel movement today or not.  Last dose of imodium was early Tuesday am.    Son reports that patient has been drinking more liquids today, saying that she is hungry which is an improvement.  Reports that she tried some eggs but smell "got to her" and she couldn't eat them.  Has eaten some melon today.    Son has not gotten the banatrol plus at this time.  Has imodium on hand.    Intervention: Son wanting to try osmolite 1.5 this afternoon again after stopping antibiotic for 24 hours. RD discussed option of vital 1.5 and let son know that will likely take 1 day to get vital 1.5 sent to residence.   Provided contact number to DME for son to keep on hand if he has questions regarding pump, formula, supplies.   Son has information regarding banatrol plus as well to help with diarrhea.   Next visit: phone follow-up tomorrow  Cindy Bowman, [place], [place] Registered Dietitian [phone] (pager)

## 2018-05-30 ENCOUNTER — Inpatient Hospital Stay: Payer: Medicare Other

## 2018-05-30 MED ORDER — VITAL 1.5 CAL PO LIQD: ORAL | 2 refills | Status: AC

## 2018-05-30 NOTE — Progress Notes (Addendum)
Nutrition Follow-up:  Patient with stage IB adenocarcinoma of pancreas.  Patient s/p laparaoscopy/J-tube placement/hernia repair on 3/19 by Dr. Barry Dienes.  Noted during lap metastatic lesion found in liver.  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  Noted c-diff was negative in hospital.  Inpatient RD notes reviewed and patient was tolerateing J-tube feeding at 73ml/hr of osmolite 1.2 with some nausea (thought to be associated with possible gastric outlet obstruction from pseudocyst better after drained) some diarrhea but not excessive.    Since being home and starting tube feeding on the evening of April 2nd of osomolite 1.2 patient has had excessive diarrhea.  Tube feeding was stopped on April 4 by son due to diarrhea.  Patient has been on antibiotics since being out of the hospital.  Rate has been adjusted down to 48ml/hr and 36ml/hr and continues to have diarrhea.  Antibiotics were stopped on April 7th and tube feeding was restarted on April 8th at 75ml/hr at [place].  Son reports tube feeding ran from 6-midnight and had diarrhea of 3-4 times (watery) with imodium being given 4 times from 6pm to midnight.    Son reports patient is drinking water, eating broth, drank 8 oz of carnation instant breakfast.  Trying to give nausea medications prior to eating.  Reports smells make patient sick    Medications: imodium  Labs: K 2.6, creatinine 1.18  Anthropometrics:    Noted 4/1 at 125lb.  No new weight since discharge from hospital   Estimated Energy Needs  Kcals: 1620-1890 calories Protein: 81-94 g Fluid: 1.8  NUTRITION DIAGNOSIS: Malnutrition continues   MALNUTRITION DIAGNOSIS: severe malnutrition continues   INTERVENTION:  Recommend vital 1.5 (peptide based formula for malabsorption and digestion) at goal rate of 51ml/hr for 24 hours to help with diarrhea.  Water flush will be 160ml 4 times daily.  Discussed with son and agreeable.  Will contact DME for  new formula. Continue to recommend banatrol plus to help with diarrhea.       MONITORING, EVALUATION, GOAL: tube feeding tolerance, intake, weight   NEXT VISIT: phone follow-up Monday, April 13  Cindy Bowman B. Zenia Resides, [place], [place] Registered Dietitian [phone] (pager)

## 2018-06-03 ENCOUNTER — Telehealth: Payer: Self-pay

## 2018-06-03 NOTE — Telephone Encounter (Signed)
Nutrition Follow-up:  RD working remotely.   Called and spoke with son, Rinell and patient for nutrition follow-up.  Son reports that over the weekend Friday and Saturday tried tube feeding of osmolite at 27ml/hr for about 1 hour on and 1 hour off for about 4-5 hours.  Son reports "mushy" stool.  Patient reports abdominal pain, some nausea and diarrhea are the biggest issues keeping her from taking more tube feeding.  New vital 1.5 formula has not been delivered yet to patient.  Patient concerned that formula contains milk and she has trouble tolerating milk products.    Son reports patient drank carnation instant breakfast over the weekend as well and had diarrhea.  Also drank broth and ate few bites of melon (honeydew and cantelope)   Medications: reviewed  Labs: no new labs  Anthropometrics:   No new weight   Estimated Energy Needs  Kcals: 1620-1890 calories Protein: 81-94 g Fluid: 1.8 L  NUTRITION DIAGNOSIS: Malnutrition continues   MALNUTRITION DIAGNOSIS: severe malnutrition continues   INTERVENTION:  Talked with son and patient about the minimal amount of nutrition patient is currently receiving with small amount of tube feeding being infused.  Patient not meeting nutritional needs orally or via feeding tube. Encouraged patient to take pain medications as prescribed to help with pain, may also slow diarrhea as well. Encouraged patient to use imodium as prescribed and order banatrol plus (given orally or via J-tube has not picked up any yet).   Encouraged patient to reach out to MD for medication adjustments if these are not working.   Will start vital 1.5 once new formula arrives.  Current tube feeding formulas are lactose free and discussed with son and patient.       MONITORING, EVALUATION, GOAL: TF tolerance, weight trends.    NEXT VISIT: phone f/u Thursday, April 16  Laneisha Mino B. Zenia Resides, [place], [place] Registered Dietitian [phone] (pager)

## 2018-06-04 ENCOUNTER — Encounter: Payer: Self-pay | Admitting: Oncology

## 2018-06-04 ENCOUNTER — Other Ambulatory Visit: Payer: Self-pay

## 2018-06-04 ENCOUNTER — Inpatient Hospital Stay: Payer: Medicare Other | Admitting: Hospice and Palliative Medicine

## 2018-06-04 ENCOUNTER — Inpatient Hospital Stay: Payer: Medicare Other

## 2018-06-04 ENCOUNTER — Inpatient Hospital Stay: Payer: Medicare Other | Admitting: *Deleted

## 2018-06-04 ENCOUNTER — Inpatient Hospital Stay (HOSPITAL_BASED_OUTPATIENT_CLINIC_OR_DEPARTMENT_OTHER): Payer: Medicare Other | Admitting: Oncology

## 2018-06-04 DIAGNOSIS — C259 Malignant neoplasm of pancreas, unspecified: Secondary | ICD-10-CM

## 2018-06-04 DIAGNOSIS — D649 Anemia, unspecified: Secondary | ICD-10-CM

## 2018-06-04 DIAGNOSIS — Z931 Gastrostomy status: Secondary | ICD-10-CM

## 2018-06-04 DIAGNOSIS — R531 Weakness: Secondary | ICD-10-CM

## 2018-06-04 DIAGNOSIS — C787 Secondary malignant neoplasm of liver and intrahepatic bile duct: Secondary | ICD-10-CM | POA: Diagnosis not present

## 2018-06-04 DIAGNOSIS — C250 Malignant neoplasm of head of pancreas: Secondary | ICD-10-CM | POA: Diagnosis not present

## 2018-06-04 DIAGNOSIS — E860 Dehydration: Secondary | ICD-10-CM

## 2018-06-04 DIAGNOSIS — K869 Disease of pancreas, unspecified: Secondary | ICD-10-CM | POA: Diagnosis not present

## 2018-06-04 DIAGNOSIS — Z95828 Presence of other vascular implants and grafts: Secondary | ICD-10-CM

## 2018-06-04 DIAGNOSIS — N189 Chronic kidney disease, unspecified: Secondary | ICD-10-CM

## 2018-06-04 DIAGNOSIS — G893 Neoplasm related pain (acute) (chronic): Secondary | ICD-10-CM

## 2018-06-04 DIAGNOSIS — R634 Abnormal weight loss: Secondary | ICD-10-CM

## 2018-06-04 DIAGNOSIS — R5383 Other fatigue: Secondary | ICD-10-CM

## 2018-06-04 DIAGNOSIS — R52 Pain, unspecified: Secondary | ICD-10-CM

## 2018-06-04 DIAGNOSIS — R197 Diarrhea, unspecified: Secondary | ICD-10-CM

## 2018-06-04 DIAGNOSIS — N289 Disorder of kidney and ureter, unspecified: Secondary | ICD-10-CM

## 2018-06-04 DIAGNOSIS — Z515 Encounter for palliative care: Secondary | ICD-10-CM

## 2018-06-04 DIAGNOSIS — R630 Anorexia: Secondary | ICD-10-CM

## 2018-06-04 LAB — COMPREHENSIVE METABOLIC PANEL
ALT: 21 U/L (ref 0–44)
AST: 26 U/L (ref 15–41)
Albumin: 2.1 g/dL — ABNORMAL LOW (ref 3.5–5.0)
Alkaline Phosphatase: 191 U/L — ABNORMAL HIGH (ref 38–126)
Anion gap: 12 (ref 5–15)
BUN: 15 mg/dL (ref 8–23)
CO2: 17 mmol/L — ABNORMAL LOW (ref 22–32)
Calcium: 8.2 mg/dL — ABNORMAL LOW (ref 8.9–10.3)
Chloride: 108 mmol/L (ref 98–111)
Creatinine, Ser: 1.07 mg/dL — ABNORMAL HIGH (ref 0.44–1.00)
GFR calc Af Amer: >60 mL/min (ref >60)
GFR calc non Af Amer: 53 mL/min — ABNORMAL LOW (ref >60)
Glucose, Bld: 79 mg/dL (ref 70–99)
Potassium: 3.7 mmol/L (ref 3.5–5.1)
Sodium: 137 mmol/L (ref 135–145)
Total Bilirubin: 0.8 mg/dL (ref 0.3–1.2)
Total Protein: 7.1 g/dL (ref 6.5–8.1)

## 2018-06-04 LAB — CBC WITH DIFFERENTIAL/PLATELET
Abs Immature Granulocytes: 0.08 10*3/uL — ABNORMAL HIGH (ref 0.00–0.07)
Basophils Absolute: 0 10*3/uL (ref 0.0–0.1)
Basophils Relative: 0 %
Eosinophils Absolute: 0.1 10*3/uL (ref 0.0–0.5)
Eosinophils Relative: 1 %
HCT: 27.4 % — ABNORMAL LOW (ref 36.0–46.0)
Hemoglobin: 9 g/dL — ABNORMAL LOW (ref 12.0–15.0)
Immature Granulocytes: 1 %
Lymphocytes Relative: 14 %
Lymphs Abs: 1.8 10*3/uL (ref 0.7–4.0)
MCH: 29.9 pg (ref 26.0–34.0)
MCHC: 32.8 g/dL (ref 30.0–36.0)
MCV: 91 fL (ref 80.0–100.0)
Monocytes Absolute: 1.5 10*3/uL — ABNORMAL HIGH (ref 0.1–1.0)
Monocytes Relative: 12 %
Neutro Abs: 9.4 10*3/uL — ABNORMAL HIGH (ref 1.7–7.7)
Neutrophils Relative %: 72 %
Platelets: 337 10*3/uL (ref 150–400)
RBC: 3.01 MIL/uL — ABNORMAL LOW (ref 3.87–5.11)
RDW: 20 % — ABNORMAL HIGH (ref 11.5–15.5)
WBC: 12.9 10*3/uL — ABNORMAL HIGH (ref 4.0–10.5)
nRBC: 0 % (ref 0.0–0.2)

## 2018-06-04 LAB — MAGNESIUM: Magnesium: 1.9 mg/dL (ref 1.7–2.4)

## 2018-06-04 LAB — SAMPLE TO BLOOD BANK

## 2018-06-04 LAB — AMYLASE: Amylase: 26 U/L — ABNORMAL LOW (ref 28–100)

## 2018-06-04 LAB — LIPASE, BLOOD: Lipase: 17 U/L (ref 11–51)

## 2018-06-04 MED ORDER — SODIUM CHLORIDE 0.9 % IV SOLN
INTRAVENOUS | Status: DC
  Administered 2018-06-04: 12:00:00 1000 mL via INTRAVENOUS
  Filled 2018-06-04 (×2): qty 250

## 2018-06-04 MED ORDER — SODIUM CHLORIDE 0.9% FLUSH
10.0000 mL | Freq: Once | INTRAVENOUS | Status: AC
  Administered 2018-06-04: 10 mL via INTRAVENOUS
  Filled 2018-06-04: qty 10

## 2018-06-04 MED ORDER — DEXAMETHASONE SODIUM PHOSPHATE 10 MG/ML IJ SOLN
10.0000 mg | Freq: Once | INTRAMUSCULAR | Status: AC
  Administered 2018-06-04: 10 mg via INTRAVENOUS
  Filled 2018-06-04: qty 1

## 2018-06-04 MED ORDER — SODIUM CHLORIDE 0.9 % IV SOLN
Freq: Once | INTRAVENOUS | Status: AC
  Administered 2018-06-04: 13:00:00 1000 mL via INTRAVENOUS
  Filled 2018-06-04: qty 250

## 2018-06-04 MED ORDER — HEPARIN SOD (PORK) LOCK FLUSH 100 UNIT/ML IV SOLN
500.0000 [IU] | Freq: Once | INTRAVENOUS | Status: AC
  Administered 2018-06-04: 14:00:00 500 [IU] via INTRAVENOUS
  Filled 2018-06-04: qty 5

## 2018-06-04 MED ORDER — SODIUM CHLORIDE 0.9 % IV SOLN: 10.0000 mg | Freq: Once | INTRAVENOUS | Status: DC

## 2018-06-04 MED ORDER — DEXAMETHASONE 4 MG PO TABS: 4.0000 mg | ORAL_TABLET | Freq: Every day | ORAL | 1 refills | Status: AC

## 2018-06-04 NOTE — Progress Notes (Signed)
[place]  [phone] [phone]   Name: Cindy Bowman Date: 06/04/2018 MRN: [id number]  DOB: 24-Nov-1950  Patient Care Team: Glendon Axe, MD as PCP - General (Internal Medicine) Clent Jacks, RN as Registered Nurse Lloyd Huger, MD as Medical Oncologist (Medical Oncology)    REASON FOR CONSULTATION: Palliative Care consult requested for this67 y.o.femalewith multiple medical problems including  adenocarcinoma of the pancreas (diagnosed 09/2017) s/p neoadjuvant FOLFIRINOX. Patient also found to have a L. Renal mass suspicious for RCC. Patientdeveloped necrotizing pancreatitis after fiduciary placement procedure at [facility] which was subsequently managed conservatively. She was hospitalized2/14/20 to 04/09/18 with intractable abdominal pain. CT scan revealed large pancreatic pseudocyst without evidence of further necrosis. She was  Managed conservatively.Patient was hospitalized again 05/09/18 to 05/23/18 with gastric outlet obstruction and an enlarging pancreatic pseudocyst. 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 Patient has been referred to palliative care to help with symptom management andaddress treatmentgoals.  SOCIAL HISTORY:    Patient is divorced.  She has one son with whom she lives.  Patient moved here from [place].  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  ADVANCE DIRECTIVES:  Patient says her son is her healthcare power of attorney.  Patient says she has a living will.  CODE STATUS: DNR (MOST completed on 12/01/18)  PAST MEDICAL HISTORY: Past Medical History:  Diagnosis Date   Anemia    Complication of anesthesia    Difficult airway for intubation    2018 Oophorectomy, CRNA unable to visualized vocal cords with MAC 3 bladed. Easy ventilations O2 sats 99%. Rolls placed under the shoulders and pillows under the head  Repeat Laryngoscopy with MAC 3 blade > Visualization not much better. O2 sats 99%. Intubation with Glidescope on the Second Attempt. BBSE good ETCO 2 O 2 sats 99%.    Essential hypertension    Pancreatic cancer (HCC)    PONV (postoperative nausea and vomiting)    UTI (urinary tract infection)     PAST SURGICAL HISTORY:  Past Surgical History:  Procedure Laterality Date   BIOPSY  05/16/2018   Procedure: BIOPSY;  Surgeon: Rush Landmark, Telford Nab., MD;  Location: [facility];  Service: Gastroenterology;;   CESAREAN SECTION     CHOLECYSTECTOMY     ESOPHAGOGASTRODUODENOSCOPY (EGD) WITH PROPOFOL N/A 05/16/2018   Procedure: ESOPHAGOGASTRODUODENOSCOPY (EGD) WITH PROPOFOL;  Surgeon: Irving Copas., MD;  Location: [facility];  Service: Gastroenterology;  Laterality: N/A;   EUS N/A 10/25/2017   Procedure: FULL UPPER ENDOSCOPIC ULTRASOUND (EUS) RADIAL;  Surgeon: Jola Schmidt, MD;  Location: [facility];  Service: Endoscopy;  Laterality: N/A;   IR RADIOLOGIST EVAL & MGMT  05/08/2018   LAPAROSCOPY N/A 05/09/2018   Procedure: LAPAROSCOPY DIAGNOSTIC AND J TUBE PLACEMENT (FEEDING TUBE);  Surgeon: Stark Klein, MD;  Location: [facility];  Service: General;  Laterality: N/A;   OOPHORECTOMY     PANCREATIC STENT PLACEMENT N/A 05/16/2018   Procedure: Zara Chess PLACEMENT;  Surgeon: Irving Copas., MD;  Location: [facility];  Service: Gastroenterology;  Laterality: N/A;  Hobbs Double Pigtail stent placed Balloon Dialtion of Cyst opening Cautery used to open cyst wall   PORTA CATH INSERTION N/A 11/12/2017   Procedure: PORTA CATH INSERTION;  Surgeon: Algernon Huxley, MD;  Location: [place] [facility];  Service: Cardiovascular;  Laterality: N/A;   TUBAL LIGATION     UPPER ESOPHAGEAL ENDOSCOPIC ULTRASOUND (EUS) N/A 05/16/2018  Procedure: UPPER ESOPHAGEAL ENDOSCOPIC ULTRASOUND (EUS);  Surgeon: Irving Copas., MD;  Location: [facility];  Service: Gastroenterology;   Laterality: N/A;    HEMATOLOGY/ONCOLOGY HISTORY:    Malignant neoplasm of head of pancreas ([place])   10/18/2017 Initial Diagnosis    Pancreatic adenocarcinoma ([place])    10/26/2017 Cancer Staging    Staging form: Exocrine Pancreas, AJCC 8th Edition - Clinical stage from 10/26/2017: Stage IB (cT2, cN0, cM0) - Signed by Lloyd Huger, MD on 10/26/2017    11/06/2017 - 12/11/2017 Chemotherapy    The patient had palonosetron (ALOXI) injection 0.25 mg, 0.25 mg, Intravenous,  Once, 2 of 4 cycles Administration: 0.25 mg (11/14/2017), 0.25 mg (11/28/2017) irinotecan (CAMPTOSAR) 340 mg in dextrose 5 % 500 mL chemo infusion, 180 mg/m2 = 340 mg, Intravenous,  Once, 2 of 4 cycles Dose modification: 160 mg/m2 (original dose 180 mg/m2, Cycle 2, Reason: Dose not tolerated) Administration: 340 mg (11/14/2017), 300 mg (11/28/2017) leucovorin 750 mg in dextrose 5 % 250 mL infusion, 760 mg, Intravenous,  Once, 2 of 4 cycles Dose modification: 360 mg/m2 (original dose 400 mg/m2, Cycle 2, Reason: Dose not tolerated) Administration: 750 mg (11/14/2017), 700 mg (11/28/2017) oxaliplatin (ELOXATIN) 150 mg in dextrose 5 % 500 mL chemo infusion, 160 mg, Intravenous,  Once, 2 of 4 cycles Dose modification: 70 mg/m2 (original dose 85 mg/m2, Cycle 2, Reason: Dose not tolerated) Administration: 150 mg (11/14/2017), 135 mg (11/28/2017) fluorouracil (ADRUCIL) chemo injection 750 mg, 400 mg/m2 = 750 mg, Intravenous,  Once, 2 of 4 cycles Dose modification: 360 mg/m2 (original dose 400 mg/m2, Cycle 2, Reason: Dose not tolerated) Administration: 750 mg (11/14/2017), 700 mg (11/28/2017) fluorouracil (ADRUCIL) 4,550 mg in sodium chloride 0.9 % 59 mL chemo infusion, 2,400 mg/m2 = 4,550 mg, Intravenous, 1 Day/Dose, 2 of 4 cycles Dose modification: 2,000 mg/m2 (original dose 2,400 mg/m2, Cycle 2, Reason: Dose not tolerated) Administration: 4,550 mg (11/14/2017), 3,800 mg (11/28/2017)  for chemotherapy treatment.     12/26/2017 -  Chemotherapy      The patient had gemcitabine (GEMZAR) 1,800 mg in sodium chloride 0.9 % 250 mL chemo infusion, 1,786 mg, Intravenous,  Once, 3 of 4 cycles Administration: 1,800 mg (12/26/2017), 1,800 mg (01/02/2018), 1,800 mg (01/09/2018), 1,800 mg (01/23/2018), 1,800 mg (01/30/2018)  for chemotherapy treatment.      ALLERGIES:  is allergic to nsaids; ibuprofen; and penicillins.  MEDICATIONS:  Current Outpatient Medications  Medication Sig Dispense Refill   acetaminophen (TYLENOL) 500 MG tablet Take 1 tablet by mouth every 6 (six) hours as needed.     apixaban (ELIQUIS) 5 MG TABS tablet Take 1 tablet (5 mg total) by mouth 2 (two) times daily. 60 tablet 3   bacitracin-polymyxin b (POLYSPORIN) ophthalmic ointment Place 2 application into both eyes 2 (two) times daily.     ciprofloxacin (CIPRO) 250 MG tablet Take 1 tablet (250 mg total) by mouth 2 (two) times daily. 28 tablet 0   clotrimazole-betamethasone (LOTRISONE) cream 1 application 2 (two) times daily.     dexamethasone (DECADRON) 4 MG tablet Take 1 tablet (4 mg total) by mouth daily. 30 tablet 1   fentaNYL (DURAGESIC) 75 MCG/HR Place 1 patch onto the skin every 3 (three) days. 5 patch 0   folic acid (FOLVITE) 1 MG tablet TAKE 1 TABLET BY MOUTH EVERY DAY (Patient taking differently: Take 1 mg by mouth daily. ) 90 tablet 1   gabapentin (NEURONTIN) 300 MG capsule Take 1 capsule (300 mg total) by mouth at bedtime. 90 capsule 0  lidocaine-prilocaine (EMLA) cream Apply 1 application topically as needed. (Patient taking differently: Apply 1 application topically as needed (port access). ) 30 g 2   lipase/protease/amylase (CREON) 36000 UNITS CPEP capsule Take 36,000-72,000 Units by mouth See admin instructions. Take 36000 units with small meals and 72000 units with big meals     loperamide (IMODIUM) 2 MG capsule Take 2 mg by mouth as needed for diarrhea or loose stools.      losartan-hydrochlorothiazide (HYZAAR) 50-12.5 MG tablet Take 1 tablet by  mouth 1 day or 1 dose.     NARCAN 4 MG/0.1ML LIQD nasal spray kit Place 1 spray into the nose as needed (opioid overdose).      Nutritional Supplements (FEEDING SUPPLEMENT, VITAL 1.5 CAL,) LIQD Give vital 1.5 91m/hr using continous pump for 24 hours via J-tube. Flush tube with water 1838m4 times per day. 1200 mL 2   ondansetron (ZOFRAN-ODT) 4 MG disintegrating tablet Take 1 tablet by mouth every 8 (eight) hours as needed.     oxyCODONE (ROXICODONE) 15 MG immediate release tablet Take 1-2 tablets (15-30 mg total) by mouth every 4 (four) hours as needed for pain. 90 tablet 0   potassium chloride (KLOR-CON SPRINKLE) 10 MEQ CR capsule Take 2 capsules (20 mEq total) by mouth daily. May open capsule to sprinkle on food. 30 capsule 0   prochlorperazine (COMPAZINE) 5 MG tablet Take 1 tablet (5 mg total) by mouth every 6 (six) hours as needed for nausea or vomiting. 30 tablet 0   promethazine (PHENERGAN) 12.5 MG suppository Place 1 suppository (12.5 mg total) rectally every 6 (six) hours as needed for nausea or vomiting. 12 each 0   promethazine (PHENERGAN) 12.5 MG tablet Take 12.5 mg by mouth every 6 (six) hours as needed for nausea or vomiting.     tiZANidine (ZANAFLEX) 2 MG tablet Take 1 tablet (2 mg total) by mouth every 8 (eight) hours as needed for muscle spasms. 30 tablet 0   traMADol (ULTRAM) 50 MG tablet Take 1 tablet by mouth at bedtime.     No current facility-administered medications for this visit.    Facility-Administered Medications Ordered in Other Visits  Medication Dose Route Frequency Provider Last Rate Last Dose   0.9 %  sodium chloride infusion   Intravenous Once FiLloyd HugerMD 999 mL/hr at 06/04/18 1249     0.9 %  sodium chloride infusion   Intravenous Continuous FiLloyd HugerMD   Stopped at 06/04/18 1246   heparin lock flush 100 unit/mL  500 Units Intravenous Once FiLloyd HugerMD       prochlorperazine (COMPAZINE) injection 10 mg  10 mg  Intravenous Once RoRexene Agent      VITAL SIGNS: There were no vitals taken for this visit. There were no vitals filed for this visit.  Estimated body mass index is 21.43 kg/m as calculated from the following:   Height as of 05/16/18: _0  (1.6 m).   Weight as of an earlier encounter on 06/04/18: 121 lb (54.9 kg).  LABS: CBC:    Component Value Date/Time   WBC 12.9 (H) 06/04/2018 0937   HGB 9.0 (L) 06/04/2018 0937   HCT 27.4 (L) 06/04/2018 0937   PLT 337 06/04/2018 0937   MCV 91.0 06/04/2018 0937   NEUTROABS 9.4 (H) 06/04/2018 0937   LYMPHSABS 1.8 06/04/2018 0937   MONOABS 1.5 (H) 06/04/2018 0937   EOSABS 0.1 06/04/2018 0937   BASOSABS 0.0 06/04/2018 0937   Comprehensive  Metabolic Panel:    Component Value Date/Time   NA 137 06/04/2018 0946   K 3.7 06/04/2018 0946   CL 108 06/04/2018 0946   CO2 17 (L) 06/04/2018 0946   BUN 15 06/04/2018 0946   CREATININE 1.07 (H) 06/04/2018 0946   GLUCOSE 79 06/04/2018 0946   CALCIUM 8.2 (L) 06/04/2018 0946   AST 26 06/04/2018 0946   ALT 21 06/04/2018 0946   ALKPHOS 191 (H) 06/04/2018 0946   BILITOT 0.8 06/04/2018 0946   PROT 7.1 06/04/2018 0946   ALBUMIN 2.1 (L) 06/04/2018 0946    RADIOGRAPHIC STUDIES: Ct Abdomen Wo Contrast  Result Date: 05/20/2018 CLINICAL DATA:  s/p Procedure(s):LAPAROSCOPY DIAGNOSTIC AND J TUBE PLACEMENT (FEEDING TUBE) 3/19/2020Pt c/o N/V today EXAM: CT ABDOMEN WITHOUT CONTRAST TECHNIQUE: Multidetector CT imaging of the abdomen was performed following the standard protocol without IV contrast. COMPARISON:  Radiographs, 05/19/2018. Prior abdomen and pelvis CTs, most recent dated 05/10/2018. FINDINGS: Lower chest: Lower minimal effusions lobe. Dependent lung base opacity mostly in the lower lobes consistent with atelectasis. Both have improved from the prior CT. Hepatobiliary: Diffusely decreased attenuation of the liver consistent with fatty infiltration. No discrete liver mass. Status post cholecystectomy. No bile  duct dilation. Pancreas: Hypoattenuating mass, uncinate process, unchanged from the most recent prior study, less well-defined due to lack of contrast. The large pseudocyst noted along the anterior pancreatic body and tail is significantly decreased in size subsequent to placement of the double pigtail catheter extending from the stomach into the pseudocyst. Cyst now measures approximately 7.7 cm in long axis by 2.0 x 1.4 cm. Spleen: Normal in size without focal abnormality. Adrenals/Urinary Tract: No adrenal masses. Midpole left renal mass, 3.1 cm in greatest dimension, unchanged from the prior study. No new renal masses, no stones and no hydronephrosis. Visualized ureters are unremarkable. Stomach/Bowel: Double pigtail catheter extends from the mid stomach across the posterior gastric wall into the pancreatic pseudocyst, well-positioned and significantly decompressing the pseudocyst. Apparent wall thickening of the distal stomach, which may be reactive to the underlying pancreatic inflammation. Well-positioned jejunostomy tube stable from the prior CT. Mild distention proximal small bowel loops, maximum diameter 3.7 cm no defined transition point, but the more distal small bowel is decompressed. No small bowel wall thickening. Visualized colon is mostly decompressed. No wall thickening or adjacent inflammation. Vascular/Lymphatic: No aneurysm.  No discrete enlarged lymph nodes. Other: Trace amount of ascites primarily seen adjacent to the liver, increased from the previous CT scan. Musculoskeletal: No osteoblastic or osteolytic lesions. IMPRESSION: 1. Large pseudocyst noted on the prior CT along the anterior margin of the pancreatic body and tail has been significant the decompressed following placement a double pigtail catheter extending from the stomach into the pseudocyst, through the stomach posterior wall. 2. Trace amount of ascites which is increased compared to the prior CT. 3. Mild interval decrease in  pleural effusions and dependent lung base atelectasis since the prior CT. 4. Mild dilation of proximal small bowel. This may reflect an adynamic ileus or a partial obstruction. A discrete transition point is not defined, however. 5. No other changes from the recent prior CT. Pancreatic mass is less well-defined due to lack of intravenous contrast. Left renal mass is stable. Electronically Signed   By: Lajean Manes M.D.   On: 05/20/2018 16:07   Dg Abd 1 View  Result Date: 05/17/2018 CLINICAL DATA:  Nausea.  Cystogastrostomy stent placement EXAM: ABDOMEN - 1 VIEW COMPARISON:  May 11, 2018 FINDINGS: Catheter tip is in the  left mid abdomen. There is mild dilatation of the rectum due to fluid and stool. Elsewhere, there is no appreciable bowel dilatation. No air-fluid level. There are surgical clips in the upper abdomen. A drain is noted in the medial left upper abdomen. No free air evident. IMPRESSION: Catheter tip in lateral left mid abdomen region. Drain and left upper quadrant in or overlying the stomach. Rectum distended with fluid and stool. No bowel obstruction or free air demonstrable. Electronically Signed   By: Lowella Grip III M.D.   On: 05/17/2018 08:26   Ct Abdomen Pelvis W Contrast  Result Date: 05/10/2018 CLINICAL DATA:  Stage IV pancreatic cancer. Status post laparoscopy and J-tube placement. EXAM: CT ABDOMEN AND PELVIS WITH CONTRAST TECHNIQUE: Multidetector CT imaging of the abdomen and pelvis was performed using the standard protocol following bolus administration of intravenous contrast. CONTRAST:  168m OMNIPAQUE IOHEXOL 300 MG/ML  SOLN COMPARISON:  04/04/2018 FINDINGS: Lower chest: Patchy bilateral lower lobe opacities, likely atelectasis. Small bilateral pleural effusions. Hepatobiliary: Hepatic steatosis with geographic areas of focal fatty sparing. Possible 5 x 10 mm serosal implant along the anterior surface of the liver inferiorly along segment 3 (series 2/image 46). Status post  cholecystectomy. No intrahepatic or extrahepatic ductal dilatation. Pancreas: Parenchymal atrophy. 2.4 x 3.0 cm hypoenhancing mass in the uncinate process (series 2/image 86), corresponding to the patient's known pancreatic adenocarcinoma, previously 2.4 x 2.8 cm. Adjacent fiducial markers. Large pseudocyst along the pancreatic body/tail measuring 7.2 x 12.7 cm (series 2/image 49), previously 5.0 x 9.8 cm. Additional 4.9 x 6.0 cm complex pseudocyst along the pancreatic tail/splenic hilum (series 2/image 25), previously 3.5 x 2.9 cm. Spleen: Within normal limits. Adrenals/Urinary Tract: Adrenal glands are within normal limits. 2.9 x 2.6 cm enhancing mass in the lateral left upper kidney (series 2/image 83), compatible with solid renal neoplasm, grossly unchanged. Right kidney is within normal limits. No hydronephrosis. Bladder is underdistended but unremarkable. Stomach/Bowel: Stomach is grossly unremarkable. Visualized bowel is notable for a percutaneous jejunostomy in satisfactory position. No evidence of bowel obstruction. Normal appendix (series 4/image 101). Left colonic diverticulosis, without evidence of diverticulitis. Vascular/Lymphatic: No evidence of abdominal aortic aneurysm. No suspicious abdominopelvic lymphadenopathy. Reproductive: Uterus is within normal limits. No adnexal masses. Other: Small volume perihepatic and pelvic ascites. Mild nondependent gas/free air related to recent laparoscopy. Tiny fat/fluid containing bilateral inguinal hernias, right greater than left. Subcutaneous emphysema/fluid along the left lateral abdominal wall, related to recent laparoscopy. Musculoskeletal: Degenerative changes of the visualized thoracolumbar spine. IMPRESSION: 3.0 cm hypoenhancing mass in the uncinate process, corresponding to the patient's known pancreatic adenocarcinoma, mildly increased. Adjacent fiducial markers. Progressive pseudocysts in the left upper abdomen. Possible new 10 mm serosal implant along  the anterior surface of segment 3. 2.9 cm solid renal neoplasm in the left upper kidney, grossly unchanged. Postsurgical changes related to recent laparoscopy, as above. Placement of an indwelling percutaneous jejunostomy in satisfactory position. Electronically Signed   By: SJulian HyM.D.   On: 05/10/2018 14:14   Dg Abd 2 Views  Result Date: 05/19/2018 CLINICAL DATA:  Pancreatic carcinoma, nausea and vomiting. EXAM: ABDOMEN - 2 VIEW COMPARISON:  05/17/2018 FINDINGS: The Axios stent cyst gastrostomy with double pigtail catheter appears stable in position. There is an elongated air-fluid level in the stomach. Jejunostomy tube shows stable positioning. Bowel-gas shows mild prominence of a few small bowel loops which may be consistent with a component of ileus/enteritis. No overt small bowel obstruction identified. No evidence of free intraperitoneal air. IMPRESSION: 1.  Stable positioning of cyst gastrostomy stent with traversing double pigtail catheter. There is an elongated air-fluid level in the stomach likely related to fluid distention of the stomach. 2. Mild gaseous prominence of a few small bowel loops which could be consistent with a component of ileus/enteritis. No overt small bowel obstruction. 3. Stable positioning of jejunostomy tube. Electronically Signed   By: Aletta Edouard M.D.   On: 05/19/2018 11:50   Dg Abd 2 Views  Result Date: 05/11/2018 CLINICAL DATA:  Recent abdominal laparoscopy with J-tube insertion on 05/09/2018. Vomiting status post surgery. History of pancreatic cancer and cholecystectomy. EXAM: ABDOMEN - 2 VIEW COMPARISON:  Plain film of the abdomen dated 04/04/2018. CT abdomen dated 05/10/2018. FINDINGS: Percutaneous jejunostomy catheter overlies the lower abdomen, similar compared to the positioning on CT of 05/10/2018. Overall bowel gas pattern is nonobstructive. No dilated large or small bowel loops seen. No evidence of abnormal fluid collection or free intraperitoneal  air seen. Cholecystectomy clips in the RIGHT upper quadrant. No acute appearing osseous abnormality. IMPRESSION: Nonobstructive bowel gas pattern and no evidence of acute intra-abdominal abnormality. Percutaneous jejunostomy catheter overlying the lower abdomen. Electronically Signed   By: Franki Cabot M.D.   On: 05/11/2018 13:00   Ir Radiologist Eval & Mgmt  Result Date: 05/08/2018 Please refer to notes tab for details about interventional procedure. (Op Note)   PERFORMANCE STATUS (ECOG) : 3 - Symptomatic, >50% confined to bed  Review of Systems Unless otherwise noted, a complete review of systems is negative.  Physical Exam General: NAD, frail appearing, thin Pulmonary: unlabored Abdomen: J tube noted but not visualized  Skin: no rashes Neurological: Weakness but otherwise nonfocal  IMPRESSION: Patient was seen for routine follow-up in the clinic.  I concurrently spoke with patient's son by phone.  Patient says that diarrhea has improved but she is receiving minimal tube feeds at this point.  Son says that she is essentially receiving 30 mL of tube feeds several times a day.  Subsequently, patient has been quite fatigued and weak.  We had a lengthy conversation regarding her goals at this point.  Patient says that she is not interested in further chemotherapy but wants to maintain continuity in the cancer center to receive full labs and IV fluids as needed.  She recognizes that she will decline in the future and will eventually be at end-of-life.  Son also seems to recognize this.  We discussed hospice in detail including services in the home versus a residential hospice facility for end-of-life care.  Patient says that she is not ready for hospice and explains that she is a private person and is not interested in them coming into the home until needed.  However, son asked for the number for hospice and wanted to obtain more information.  Patient has some crampy pain when she receives  the tube feeds.  Consideration could be given for trial of an antispasmodic versus steroids.  Case and plan discussed with Dr. Grayland Ormond who will also see patient today.  PLAN: -Best supportive care -RTC in 1-2 weeks    Patient expressed understanding and was in agreement with this plan. She also understands that She can call clinic at any time with any questions, concerns, or complaints.     Time Total: 60 minutes  Visit consisted of counseling and education dealing with the complex and emotionally intense issues of symptom management and palliative care in the setting of serious and potentially life-threatening illness.Greater than 50%  of this time was spent  counseling and coordinating care related to the above assessment and plan.  Signed by: Altha Harm, PhD, NP-C [phone] (Work Cell)

## 2018-06-04 NOTE — Progress Notes (Signed)
Patient here today for follow up regarding pancreatic cancer. Patient reports feedings are going better, pain improved this morning.

## 2018-06-04 NOTE — Progress Notes (Signed)
[facility]  [phone] [phone]  ID: Cindy Bowman OB: 09-10-1950  MR#: [id number]  RWE#:[id number]  Patient Care Team: Glendon Axe, MD as PCP - General (Internal Medicine) Clent Jacks, RN as Registered Nurse Lloyd Huger, MD as Medical Oncologist (Medical Oncology)  CHIEF COMPLAINT: Clinical stage IV pancreatic adenocarcinoma.  INTERVAL HISTORY: Patient returns to clinic today for repeat laboratory work, further evaluation, and consideration of additional IV fluids.  She feels improved over last week, but still has significant weakness and fatigue and a decreased performance status.  She is elected no further chemotherapy, but is not ready to enroll in hospice.  Her diarrhea has improved, but this is likely secondary to decreasing the rate of tube feeds.  She does not complain of pain today.  She has no neurologic complaints. She denies any recent fevers.  She continues to have a poor appetite and weight loss.  She denies any chest pain, shortness of breath, cough, hemoptysis.  She has no urinary complaints.  Patient continues to feel terrible, but offers no further specific complaints today.  REVIEW OF SYSTEMS:   Review of Systems  Constitutional: Positive for malaise/fatigue and weight loss. Negative for fever.  HENT: Negative.  Negative for congestion and sinus pain.   Respiratory: Negative.  Negative for cough and shortness of breath.   Cardiovascular: Negative for chest pain and leg swelling.  Gastrointestinal: Positive for nausea. Negative for abdominal pain, blood in stool, diarrhea, melena and vomiting.  Genitourinary: Negative.  Negative for dysuria.  Musculoskeletal: Negative.  Negative for back pain.  Skin: Negative.  Negative for rash.  Neurological: Positive for weakness. Negative for dizziness, focal weakness and headaches.  Psychiatric/Behavioral: Negative.  The patient is not nervous/anxious.     As per HPI.  Otherwise, a complete review of systems is negative.  PAST MEDICAL HISTORY: Past Medical History:  Diagnosis Date   Anemia    Complication of anesthesia    Difficult airway for intubation    2018 Oophorectomy, CRNA unable to visualized vocal cords with MAC 3 bladed. Easy ventilations O2 sats 99%. Rolls placed under the shoulders and pillows under the head Repeat Laryngoscopy with MAC 3 blade > Visualization not much better. O2 sats 99%. Intubation with Glidescope on the Second Attempt. BBSE good ETCO 2 O 2 sats 99%.    Essential hypertension    Pancreatic cancer (HCC)    PONV (postoperative nausea and vomiting)    UTI (urinary tract infection)     PAST SURGICAL HISTORY: Past Surgical History:  Procedure Laterality Date   BIOPSY  05/16/2018   Procedure: BIOPSY;  Surgeon: Rush Landmark, Telford Nab., MD;  Location: [facility];  Service: Gastroenterology;;   CESAREAN SECTION     CHOLECYSTECTOMY     ESOPHAGOGASTRODUODENOSCOPY (EGD) WITH PROPOFOL N/A 05/16/2018   Procedure: ESOPHAGOGASTRODUODENOSCOPY (EGD) WITH PROPOFOL;  Surgeon: Irving Copas., MD;  Location: [facility];  Service: Gastroenterology;  Laterality: N/A;   EUS N/A 10/25/2017   Procedure: FULL UPPER ENDOSCOPIC ULTRASOUND (EUS) RADIAL;  Surgeon: Jola Schmidt, MD;  Location: [facility];  Service: Endoscopy;  Laterality: N/A;   IR RADIOLOGIST EVAL & MGMT  05/08/2018   LAPAROSCOPY N/A 05/09/2018   Procedure: LAPAROSCOPY DIAGNOSTIC AND J TUBE PLACEMENT (FEEDING TUBE);  Surgeon: Stark Klein, MD;  Location: [facility];  Service: General;  Laterality: N/A;   OOPHORECTOMY     PANCREATIC STENT PLACEMENT N/A 05/16/2018   Procedure: Zara Chess PLACEMENT;  Surgeon: Irving Copas., MD;  Location: [facility];  Service: Gastroenterology;  Laterality: N/A;  Hobbs Double Pigtail stent placed Balloon Dialtion of Cyst opening Cautery used to open cyst wall   PORTA CATH INSERTION N/A 11/12/2017   Procedure: PORTA  CATH INSERTION;  Surgeon: Algernon Huxley, MD;  Location: [facility];  Service: Cardiovascular;  Laterality: N/A;   TUBAL LIGATION     UPPER ESOPHAGEAL ENDOSCOPIC ULTRASOUND (EUS) N/A 05/16/2018   Procedure: UPPER ESOPHAGEAL ENDOSCOPIC ULTRASOUND (EUS);  Surgeon: Irving Copas., MD;  Location: [facility];  Service: Gastroenterology;  Laterality: N/A;    FAMILY HISTORY: Family History  Problem Relation Age of Onset   Hypertension Mother        deceased 59   Heart attack Father        deceased late 49s    ADVANCED DIRECTIVES (Y/N):  N  HEALTH MAINTENANCE: Social History   Tobacco Use   Smoking status: Never Smoker   Smokeless tobacco: Never Used  Substance Use Topics   Alcohol use: Not Currently   Drug use: Never     Colonoscopy:  PAP:  Bone density:  Lipid panel:  Allergies  Allergen Reactions   Nsaids Other (See Comments)    Decreased GFR   Ibuprofen     Lowers kidney function Other reaction(s): Kidney Disorder   Penicillins     Yeast infection Did it involve swelling of the face/tongue/throat, SOB, or low BP? No Did it involve sudden or severe rash/hives, skin peeling, or any reaction on the inside of your mouth or nose? No Did you need to seek medical attention at a hospital or doctor's office? No When did it last happen?Unknown If all above answers are NO, may proceed with cephalosporin use.      Current Outpatient Medications  Medication Sig Dispense Refill   acetaminophen (TYLENOL) 500 MG tablet Take 1 tablet by mouth every 6 (six) hours as needed.     apixaban (ELIQUIS) 5 MG TABS tablet Take 1 tablet (5 mg total) by mouth 2 (two) times daily. 60 tablet 3   bacitracin-polymyxin b (POLYSPORIN) ophthalmic ointment Place 2 application into both eyes 2 (two) times daily.     ciprofloxacin (CIPRO) 250 MG tablet Take 1 tablet (250 mg total) by mouth 2 (two) times daily. 28 tablet 0   clotrimazole-betamethasone  (LOTRISONE) cream 1 application 2 (two) times daily.     fentaNYL (DURAGESIC) 75 MCG/HR Place 1 patch onto the skin every 3 (three) days. 5 patch 0   folic acid (FOLVITE) 1 MG tablet TAKE 1 TABLET BY MOUTH EVERY DAY (Patient taking differently: Take 1 mg by mouth daily. ) 90 tablet 1   gabapentin (NEURONTIN) 300 MG capsule Take 1 capsule (300 mg total) by mouth at bedtime. 90 capsule 0   lidocaine-prilocaine (EMLA) cream Apply 1 application topically as needed. (Patient taking differently: Apply 1 application topically as needed (port access). ) 30 g 2   lipase/protease/amylase (CREON) 36000 UNITS CPEP capsule Take 36,000-72,000 Units by mouth See admin instructions. Take 36000 units with small meals and 72000 units with big meals     loperamide (IMODIUM) 2 MG capsule Take 2 mg by mouth as needed for diarrhea or loose stools.      losartan-hydrochlorothiazide (HYZAAR) 50-12.5 MG tablet Take 1 tablet by mouth 1 day or 1 dose.     NARCAN 4 MG/0.1ML LIQD nasal spray kit Place 1 spray into the nose as needed (opioid overdose).      Nutritional Supplements (FEEDING SUPPLEMENT, VITAL 1.5  CAL,) LIQD Give vital 1.5 39m/hr using continous pump for 24 hours via J-tube. Flush tube with water 1869m4 times per day. 1200 mL 2   ondansetron (ZOFRAN-ODT) 4 MG disintegrating tablet Take 1 tablet by mouth every 8 (eight) hours as needed.     oxyCODONE (ROXICODONE) 15 MG immediate release tablet Take 1-2 tablets (15-30 mg total) by mouth every 4 (four) hours as needed for pain. 90 tablet 0   potassium chloride (KLOR-CON SPRINKLE) 10 MEQ CR capsule Take 2 capsules (20 mEq total) by mouth daily. May open capsule to sprinkle on food. 30 capsule 0   prochlorperazine (COMPAZINE) 5 MG tablet Take 1 tablet (5 mg total) by mouth every 6 (six) hours as needed for nausea or vomiting. 30 tablet 0   promethazine (PHENERGAN) 12.5 MG suppository Place 1 suppository (12.5 mg total) rectally every 6 (six) hours as needed  for nausea or vomiting. 12 each 0   promethazine (PHENERGAN) 12.5 MG tablet Take 12.5 mg by mouth every 6 (six) hours as needed for nausea or vomiting.     tiZANidine (ZANAFLEX) 2 MG tablet Take 1 tablet (2 mg total) by mouth every 8 (eight) hours as needed for muscle spasms. 30 tablet 0   traMADol (ULTRAM) 50 MG tablet Take 1 tablet by mouth at bedtime.     dexamethasone (DECADRON) 4 MG tablet Take 1 tablet (4 mg total) by mouth daily. 30 tablet 1   No current facility-administered medications for this visit.    Facility-Administered Medications Ordered in Other Visits  Medication Dose Route Frequency Provider Last Rate Last Dose   0.9 %  sodium chloride infusion   Intravenous Continuous FiLloyd HugerMD   Stopped at 06/04/18 1246   prochlorperazine (COMPAZINE) injection 10 mg  10 mg Intravenous Once RoRexene Agent      OBJECTIVE: Vitals:   06/04/18 1021  BP: 124/75  Pulse: (!) 107  Resp: 18  Temp: (!) 97.4 F (36.3 C)     Body mass index is 21.43 kg/m.    ECOG FS:0 - Asymptomatic  General: Thin, ill-appearing. Eyes: Pink conjunctiva, anicteric sclera. HEENT: Normocephalic, moist mucous membranes, clear oropharnyx. Lungs: Clear to auscultation bilaterally. Heart: Regular rate and rhythm. No rubs, murmurs, or gallops. Abdomen: Soft, nontender, nondistended. No organomegaly noted, normoactive bowel sounds.  J-tube noted. Musculoskeletal: No edema, cyanosis, or clubbing. Neuro: Alert, answering all questions appropriately. Cranial nerves grossly intact. Skin: No rashes or petechiae noted. Psych: Normal affect.  LAB RESULTS:  Lab Results  Component Value Date   NA 137 06/04/2018   K 3.7 06/04/2018   CL 108 06/04/2018   CO2 17 (L) 06/04/2018   GLUCOSE 79 06/04/2018   BUN 15 06/04/2018   CREATININE 1.07 (H) 06/04/2018   CALCIUM 8.2 (L) 06/04/2018   PROT 7.1 06/04/2018   ALBUMIN 2.1 (L) 06/04/2018   AST 26 06/04/2018   ALT 21 06/04/2018   ALKPHOS 191 (H)  06/04/2018   BILITOT 0.8 06/04/2018   GFRNONAA 53 (L) 06/04/2018   GFRAA >60 06/04/2018    Lab Results  Component Value Date   WBC 12.9 (H) 06/04/2018   NEUTROABS 9.4 (H) 06/04/2018   HGB 9.0 (L) 06/04/2018   HCT 27.4 (L) 06/04/2018   MCV 91.0 06/04/2018   PLT 337 06/04/2018     STUDIES: Ct Abdomen Wo Contrast  Result Date: 05/20/2018 CLINICAL DATA:  s/p Procedure(s):LAPAROSCOPY DIAGNOSTIC AND J TUBE PLACEMENT (FEEDING TUBE) 3/19/2020Pt c/o N/V today EXAM: CT ABDOMEN WITHOUT  CONTRAST TECHNIQUE: Multidetector CT imaging of the abdomen was performed following the standard protocol without IV contrast. COMPARISON:  Radiographs, 05/19/2018. Prior abdomen and pelvis CTs, most recent dated 05/10/2018. FINDINGS: Lower chest: Lower minimal effusions lobe. Dependent lung base opacity mostly in the lower lobes consistent with atelectasis. Both have improved from the prior CT. Hepatobiliary: Diffusely decreased attenuation of the liver consistent with fatty infiltration. No discrete liver mass. Status post cholecystectomy. No bile duct dilation. Pancreas: Hypoattenuating mass, uncinate process, unchanged from the most recent prior study, less well-defined due to lack of contrast. The large pseudocyst noted along the anterior pancreatic body and tail is significantly decreased in size subsequent to placement of the double pigtail catheter extending from the stomach into the pseudocyst. Cyst now measures approximately 7.7 cm in long axis by 2.0 x 1.4 cm. Spleen: Normal in size without focal abnormality. Adrenals/Urinary Tract: No adrenal masses. Midpole left renal mass, 3.1 cm in greatest dimension, unchanged from the prior study. No new renal masses, no stones and no hydronephrosis. Visualized ureters are unremarkable. Stomach/Bowel: Double pigtail catheter extends from the mid stomach across the posterior gastric wall into the pancreatic pseudocyst, well-positioned and significantly decompressing the  pseudocyst. Apparent wall thickening of the distal stomach, which may be reactive to the underlying pancreatic inflammation. Well-positioned jejunostomy tube stable from the prior CT. Mild distention proximal small bowel loops, maximum diameter 3.7 cm no defined transition point, but the more distal small bowel is decompressed. No small bowel wall thickening. Visualized colon is mostly decompressed. No wall thickening or adjacent inflammation. Vascular/Lymphatic: No aneurysm.  No discrete enlarged lymph nodes. Other: Trace amount of ascites primarily seen adjacent to the liver, increased from the previous CT scan. Musculoskeletal: No osteoblastic or osteolytic lesions. IMPRESSION: 1. Large pseudocyst noted on the prior CT along the anterior margin of the pancreatic body and tail has been significant the decompressed following placement a double pigtail catheter extending from the stomach into the pseudocyst, through the stomach posterior wall. 2. Trace amount of ascites which is increased compared to the prior CT. 3. Mild interval decrease in pleural effusions and dependent lung base atelectasis since the prior CT. 4. Mild dilation of proximal small bowel. This may reflect an adynamic ileus or a partial obstruction. A discrete transition point is not defined, however. 5. No other changes from the recent prior CT. Pancreatic mass is less well-defined due to lack of intravenous contrast. Left renal mass is stable. Electronically Signed   By: Lajean Manes M.D.   On: 05/20/2018 16:07   Dg Abd 1 View  Result Date: 05/17/2018 CLINICAL DATA:  Nausea.  Cystogastrostomy stent placement EXAM: ABDOMEN - 1 VIEW COMPARISON:  May 11, 2018 FINDINGS: Catheter tip is in the left mid abdomen. There is mild dilatation of the rectum due to fluid and stool. Elsewhere, there is no appreciable bowel dilatation. No air-fluid level. There are surgical clips in the upper abdomen. A drain is noted in the medial left upper abdomen. No  free air evident. IMPRESSION: Catheter tip in lateral left mid abdomen region. Drain and left upper quadrant in or overlying the stomach. Rectum distended with fluid and stool. No bowel obstruction or free air demonstrable. Electronically Signed   By: Lowella Grip III M.D.   On: 05/17/2018 08:26   Ct Abdomen Pelvis W Contrast  Result Date: 05/10/2018 CLINICAL DATA:  Stage IV pancreatic cancer. Status post laparoscopy and J-tube placement. EXAM: CT ABDOMEN AND PELVIS WITH CONTRAST TECHNIQUE: Multidetector CT imaging of  the abdomen and pelvis was performed using the standard protocol following bolus administration of intravenous contrast. CONTRAST:  186m OMNIPAQUE IOHEXOL 300 MG/ML  SOLN COMPARISON:  04/04/2018 FINDINGS: Lower chest: Patchy bilateral lower lobe opacities, likely atelectasis. Small bilateral pleural effusions. Hepatobiliary: Hepatic steatosis with geographic areas of focal fatty sparing. Possible 5 x 10 mm serosal implant along the anterior surface of the liver inferiorly along segment 3 (series 2/image 46). Status post cholecystectomy. No intrahepatic or extrahepatic ductal dilatation. Pancreas: Parenchymal atrophy. 2.4 x 3.0 cm hypoenhancing mass in the uncinate process (series 2/image 86), corresponding to the patient's known pancreatic adenocarcinoma, previously 2.4 x 2.8 cm. Adjacent fiducial markers. Large pseudocyst along the pancreatic body/tail measuring 7.2 x 12.7 cm (series 2/image 49), previously 5.0 x 9.8 cm. Additional 4.9 x 6.0 cm complex pseudocyst along the pancreatic tail/splenic hilum (series 2/image 25), previously 3.5 x 2.9 cm. Spleen: Within normal limits. Adrenals/Urinary Tract: Adrenal glands are within normal limits. 2.9 x 2.6 cm enhancing mass in the lateral left upper kidney (series 2/image 83), compatible with solid renal neoplasm, grossly unchanged. Right kidney is within normal limits. No hydronephrosis. Bladder is underdistended but unremarkable. Stomach/Bowel:  Stomach is grossly unremarkable. Visualized bowel is notable for a percutaneous jejunostomy in satisfactory position. No evidence of bowel obstruction. Normal appendix (series 4/image 101). Left colonic diverticulosis, without evidence of diverticulitis. Vascular/Lymphatic: No evidence of abdominal aortic aneurysm. No suspicious abdominopelvic lymphadenopathy. Reproductive: Uterus is within normal limits. No adnexal masses. Other: Small volume perihepatic and pelvic ascites. Mild nondependent gas/free air related to recent laparoscopy. Tiny fat/fluid containing bilateral inguinal hernias, right greater than left. Subcutaneous emphysema/fluid along the left lateral abdominal wall, related to recent laparoscopy. Musculoskeletal: Degenerative changes of the visualized thoracolumbar spine. IMPRESSION: 3.0 cm hypoenhancing mass in the uncinate process, corresponding to the patient's known pancreatic adenocarcinoma, mildly increased. Adjacent fiducial markers. Progressive pseudocysts in the left upper abdomen. Possible new 10 mm serosal implant along the anterior surface of segment 3. 2.9 cm solid renal neoplasm in the left upper kidney, grossly unchanged. Postsurgical changes related to recent laparoscopy, as above. Placement of an indwelling percutaneous jejunostomy in satisfactory position. Electronically Signed   By: SJulian HyM.D.   On: 05/10/2018 14:14   Dg Abd 2 Views  Result Date: 05/19/2018 CLINICAL DATA:  Pancreatic carcinoma, nausea and vomiting. EXAM: ABDOMEN - 2 VIEW COMPARISON:  05/17/2018 FINDINGS: The Axios stent cyst gastrostomy with double pigtail catheter appears stable in position. There is an elongated air-fluid level in the stomach. Jejunostomy tube shows stable positioning. Bowel-gas shows mild prominence of a few small bowel loops which may be consistent with a component of ileus/enteritis. No overt small bowel obstruction identified. No evidence of free intraperitoneal air. IMPRESSION:  1. Stable positioning of cyst gastrostomy stent with traversing double pigtail catheter. There is an elongated air-fluid level in the stomach likely related to fluid distention of the stomach. 2. Mild gaseous prominence of a few small bowel loops which could be consistent with a component of ileus/enteritis. No overt small bowel obstruction. 3. Stable positioning of jejunostomy tube. Electronically Signed   By: GAletta EdouardM.D.   On: 05/19/2018 11:50   Dg Abd 2 Views  Result Date: 05/11/2018 CLINICAL DATA:  Recent abdominal laparoscopy with J-tube insertion on 05/09/2018. Vomiting status post surgery. History of pancreatic cancer and cholecystectomy. EXAM: ABDOMEN - 2 VIEW COMPARISON:  Plain film of the abdomen dated 04/04/2018. CT abdomen dated 05/10/2018. FINDINGS: Percutaneous jejunostomy catheter overlies the lower abdomen, similar  compared to the positioning on CT of 05/10/2018. Overall bowel gas pattern is nonobstructive. No dilated large or small bowel loops seen. No evidence of abnormal fluid collection or free intraperitoneal air seen. Cholecystectomy clips in the RIGHT upper quadrant. No acute appearing osseous abnormality. IMPRESSION: Nonobstructive bowel gas pattern and no evidence of acute intra-abdominal abnormality. Percutaneous jejunostomy catheter overlying the lower abdomen. Electronically Signed   By: Franki Cabot M.D.   On: 05/11/2018 13:00   Ir Radiologist Eval & Mgmt  Result Date: 05/08/2018 Please refer to notes tab for details about interventional procedure. (Op Note)   ASSESSMENT: Clinical stage IV pancreatic adenocarcinoma.  PLAN:    1.  Clinical stage IV pancreatic adenocarcinoma: EUS completed on October 25, 2017 confirmed adenocarcinoma.  Neoadjuvant FOLFIRINOX was too toxic for patient, therefore she was switched to single agent gemcitabine.  Patient's most recent imaging with CT of the abdomen with contrast on May 10, 2018 revealed persistent 3 cm mass in the  pancreas as well as progressive pseudocyst.  She was also noted to have a 1 cm serosal implant likely consistent with metastatic disease.  During patient's J-tube placement, it was noted that she had metastatic disease in her liver which was biopsied and proven malignant.  She continues to have a significantly decreased performance status.  Patient has decided that she does not want any further treatment with chemotherapy, but is not ready to enroll in hospice.  We will continue laboratory work and IV fluids once a week for the time being.  Return to clinic in 1 week for laboratory work and IV fluids and then in 2 weeks for further evaluation. 2.  Anemia: Hemoglobin decreased, but stable at 9.0.   3.  Pain: Continue fentanyl patch to 75 mcg every 3 days.  Continue oxycodone and gabapentin.  Patient was previously given referral for interventional pain clinic for consideration of celiac plexus block.  Appreciate palliative care input. 4.  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 No intervention is needed at this time.   4.  Genetic: Genetic testing is pending at time of dictation. 5.  Chronic renal insufficiency: Patient's creatinine is within normal limits today. 6.  Hypokalemia: Resolved.  Continue oral potassium supplementation. 7.  Diarrhea: Improved.  Continue Lomotil as prescribed. 8.  Pancreatic pseudocyst: Appreciate surgical input.  Patient was placed on Cipro as a prophylaxis, but this has been discontinued as a possible etiology of her diarrhea. 9.  Weight loss: Appreciate dietary input.  Continue J-tube feedings as directed. 10.  Left lower extremity DVT: Confirmed by ultrasound.  Continue Eliquis as prescribed. 11.  Dehydration: Proceed with 2 L IV fluids today.  Follow-up as above.  Patient expressed understanding and was in agreement with this plan. She also understands that She can call clinic at any time with any questions, concerns, or complaints.   Cancer  Staging Malignant neoplasm of head of pancreas [facility]) Staging form: Exocrine Pancreas, AJCC 8th Edition - Clinical stage from 10/26/2017: Stage IB (cT2, cN0, cM0) - Signed by Lloyd Huger, MD on 10/26/2017   Lloyd Huger, MD   06/04/2018 1:57 PM

## 2018-06-06 ENCOUNTER — Other Ambulatory Visit: Payer: Self-pay | Admitting: *Deleted

## 2018-06-06 ENCOUNTER — Telehealth: Payer: Self-pay

## 2018-06-06 MED ORDER — POTASSIUM CHLORIDE ER 10 MEQ PO CPCR: 20.0000 meq | ORAL_CAPSULE | Freq: Every day | ORAL | 0 refills | Status: DC

## 2018-06-06 MED ORDER — FENTANYL 75 MCG/HR TD PT72: 1.0000 | MEDICATED_PATCH | TRANSDERMAL | 0 refills | Status: AC

## 2018-06-06 NOTE — Telephone Encounter (Signed)
Rinell called asking if we can reach out to Dr Lisbeth Renshaw to see if it is necessary for her to follow up with him right now. He states he has called but is not getting a response from his office

## 2018-06-06 NOTE — Telephone Encounter (Signed)
Nutrition Follow-up:  RD working remotely.  Patient with stage Ib adenocarcinoma of pancreas.  Patient s/p laparascopy/J-tube placement/hernia repair on 3/19 by Dr. Barry Dienes.    Spoke with son Cindy Bowman for nutrition follow-up.  Son reports that they received new formula vital 1.5 yesterday and started tube feeding at 4pm yesterday afternoon at 32ml/hr.  Tube feeding has been running since 4pm and patient has been tolerating well, mushy stool not watery diarrhea maybe 2-3 times since starting tube feeding.  Son reports mother complains of more abdominal pain when giving water flush of 125ml 4 times per day.  Reports that he has been using plunger to push water in tube.    Medications: reviewed  Labs: no new  Anthropometrics:   No new weight   Estimated Energy Needs  Kcals: 1620-1890 calories Protein: 81-94 g Fluid: 1.8 L  NUTRITION DIAGNOSIS: Malnutrition continues   MALNUTRITION DIAGNOSIS: severe malnutrition continues   INTERVENTION:  Recommend increasing vital 1.5 to 46ml/hr today and if tolerating during the night increase to goal rate of 50 ml/hr in the am, goal rate.   Recommend decreasing water flush to 120 ml 6 times per day or 90 ml 8 times per day and to not push water but let water flow through tube via gravity to help with abdominal pain. Son verbalized understanding.  Son has imodium on hand and ordered banatrol plus and should be delivered today or tomorrow.  Encouraged that she take banatrol orally if can tolerate and reduce risk of tube getting clogged.  Banatrol plus can be given via tube if needed and discussed this with son.  Verbalized understanding.          MONITORING, EVALUATION, GOAL:  TF tolerance, weight trends   NEXT VISIT: phone follow-up, Monday, April 21  Cindy Bowman B. Cindy Bowman, [place], [place] Registered Dietitian [phone] (pager)

## 2018-06-10 ENCOUNTER — Ambulatory Visit: Payer: Medicare Other | Admitting: Radiation Oncology

## 2018-06-10 ENCOUNTER — Telehealth: Payer: Self-pay | Admitting: Radiation Oncology

## 2018-06-10 ENCOUNTER — Other Ambulatory Visit: Payer: Self-pay

## 2018-06-10 ENCOUNTER — Inpatient Hospital Stay: Payer: Medicare Other

## 2018-06-10 NOTE — Telephone Encounter (Signed)
I called and spoke with the patient's son and it appears that she is not moving forward with surgery given the findings at the time of her laparoscopy. We will be available as needed moving forward as she continues management with Dr. Grayland Ormond in [place].

## 2018-06-10 NOTE — Progress Notes (Addendum)
Nutrition Follow-up:  RD working remotely.  Patient with stage IB adenocarcinoma of pancreas.  Patient s/p laparoscopy/J-tube placement/hernia repair on 3/19 with Dr. Barry Dienes.    Called son, Rinell for nutrition follow-up.  Son reports that on Friday (4/17) advanced vital 1.5 to 72ml/hr and ran for 8-10 hours with 4 episodes of diarrhea (watery stool) during that time.  Reports that a dose of banatrol plus was given.  Reports that patient also had issues with abdominal cramping, pain.  Stopped tube feeding around mid morning on Saturday due to diarrhea and pain.  Restarted vital 1.5 at 62ml/hr and ran during the night with 2 bouts of diarrhea, 1 dose of banatrol plus given.  Patient also with issues of abdominal pain, cramping, feeling full.  Stopped tube feeding on Sunday mid am due to bloating and feeling full, pressure.  Did not restart tube feeding on Sunday. Son reports no pain today while being off tube feeding except when drinking some broth had cramping, pain in abdomin.  No imodium given over the weekend.  Oxycodone was given about 2 times per day over the weekend per son.  Patient does not want to take medication due to it making her sleepy.   Son reports that he has reduced amount of water being given via tube to 170ml and giving it via gravity.  This has helped some with abdominal pain and cramping.   Oral intake has been very little over the weekend with water, some broth.    Medications: oxycodone Banatrol plus being given via J-tube patient couldn't tolerate orally.    Labs: no new  Anthropometrics:   No new   Estimated Energy Needs  Kcals: 1620-1890 calories Protein: 81-94 g Fluid: 1.8 L  NUTRITION DIAGNOSIS: Malnutrition continues   MALNUTRITION DIAGNOSIS: severe malnutrition continues   INTERVENTION:  Discuss pain issues with MD and NP for further recommendations on pain regimen.  Encouraged son to continue to give banatrol plus to help with diarrhea.  Encouraged  son to restart tube feeding of vital 1.5 at 64ml/hr and increase by 50ml daily until goal rate of 53ml/hr.  Patient is currently not meeting nutritional needs with formula or oral intake, MD aware.  Discussed this with son.  Water flush will need to be 143ml 6 times per day or 3ml 8 times per day. Discussed option with son of mixing creon beads (remove capsule) with small amount of applesauce or pureed banana (baby food) (correct ph) before starting tube feeding and run for 3-4 hours, stop feeding and give another dose of creon and run tube feeding for 3-4 hours to see if helps with pain, diarrhea as well.      MONITORING, EVALUATION, GOAL: TF tolerance, weight trends   NEXT VISIT: phone f/u Thursday, April 23  Emmalou Hunger B. Zenia Resides, [place], [place] Registered Dietitian [phone] (pager)

## 2018-06-11 ENCOUNTER — Other Ambulatory Visit: Payer: Self-pay | Admitting: *Deleted

## 2018-06-11 ENCOUNTER — Other Ambulatory Visit: Payer: Self-pay

## 2018-06-11 ENCOUNTER — Inpatient Hospital Stay: Payer: Medicare Other

## 2018-06-11 ENCOUNTER — Telehealth: Payer: Self-pay | Admitting: *Deleted

## 2018-06-11 ENCOUNTER — Inpatient Hospital Stay (HOSPITAL_BASED_OUTPATIENT_CLINIC_OR_DEPARTMENT_OTHER): Payer: Medicare Other | Admitting: Hospice and Palliative Medicine

## 2018-06-11 DIAGNOSIS — E46 Unspecified protein-calorie malnutrition: Secondary | ICD-10-CM

## 2018-06-11 DIAGNOSIS — C259 Malignant neoplasm of pancreas, unspecified: Secondary | ICD-10-CM

## 2018-06-11 DIAGNOSIS — R531 Weakness: Secondary | ICD-10-CM

## 2018-06-11 DIAGNOSIS — R109 Unspecified abdominal pain: Secondary | ICD-10-CM

## 2018-06-11 DIAGNOSIS — C250 Malignant neoplasm of head of pancreas: Secondary | ICD-10-CM | POA: Diagnosis not present

## 2018-06-11 DIAGNOSIS — Z515 Encounter for palliative care: Secondary | ICD-10-CM | POA: Diagnosis not present

## 2018-06-11 DIAGNOSIS — Z95828 Presence of other vascular implants and grafts: Secondary | ICD-10-CM

## 2018-06-11 DIAGNOSIS — C787 Secondary malignant neoplasm of liver and intrahepatic bile duct: Secondary | ICD-10-CM | POA: Diagnosis not present

## 2018-06-11 DIAGNOSIS — G893 Neoplasm related pain (acute) (chronic): Secondary | ICD-10-CM

## 2018-06-11 DIAGNOSIS — N289 Disorder of kidney and ureter, unspecified: Secondary | ICD-10-CM

## 2018-06-11 DIAGNOSIS — Z66 Do not resuscitate: Secondary | ICD-10-CM

## 2018-06-11 LAB — CBC WITH DIFFERENTIAL/PLATELET
Abs Immature Granulocytes: 0.19 10*3/uL — ABNORMAL HIGH (ref 0.00–0.07)
Basophils Absolute: 0 10*3/uL (ref 0.0–0.1)
Basophils Relative: 0 %
Eosinophils Absolute: 0.1 10*3/uL (ref 0.0–0.5)
Eosinophils Relative: 0 %
HCT: 27.3 % — ABNORMAL LOW (ref 36.0–46.0)
Hemoglobin: 8.8 g/dL — ABNORMAL LOW (ref 12.0–15.0)
Immature Granulocytes: 1 %
Lymphocytes Relative: 18 %
Lymphs Abs: 3 10*3/uL (ref 0.7–4.0)
MCH: 29.7 pg (ref 26.0–34.0)
MCHC: 32.2 g/dL (ref 30.0–36.0)
MCV: 92.2 fL (ref 80.0–100.0)
Monocytes Absolute: 2.3 10*3/uL — ABNORMAL HIGH (ref 0.1–1.0)
Monocytes Relative: 14 %
Neutro Abs: 10.6 10*3/uL — ABNORMAL HIGH (ref 1.7–7.7)
Neutrophils Relative %: 67 %
Platelets: 290 10*3/uL (ref 150–400)
RBC: 2.96 MIL/uL — ABNORMAL LOW (ref 3.87–5.11)
RDW: 20 % — ABNORMAL HIGH (ref 11.5–15.5)
WBC: 16.2 10*3/uL — ABNORMAL HIGH (ref 4.0–10.5)
nRBC: 0 % (ref 0.0–0.2)

## 2018-06-11 LAB — MAGNESIUM: Magnesium: 1.8 mg/dL (ref 1.7–2.4)

## 2018-06-11 MED ORDER — SODIUM CHLORIDE 0.9% FLUSH
10.0000 mL | Freq: Once | INTRAVENOUS | Status: AC
  Administered 2018-06-11: 11:00:00 10 mL via INTRAVENOUS
  Filled 2018-06-11: qty 10

## 2018-06-11 MED ORDER — HEPARIN SOD (PORK) LOCK FLUSH 100 UNIT/ML IV SOLN
INTRAVENOUS | Status: AC
  Filled 2018-06-11: qty 5

## 2018-06-11 MED ORDER — DICYCLOMINE HCL 20 MG PO TABS: 20.0000 mg | ORAL_TABLET | Freq: Three times a day (TID) | ORAL | 0 refills | Status: AC

## 2018-06-11 MED ORDER — SODIUM CHLORIDE 0.9 % IV SOLN
Freq: Once | INTRAVENOUS | Status: DC
  Filled 2018-06-11: qty 250

## 2018-06-11 MED ORDER — SODIUM CHLORIDE 0.9 % IV SOLN
Freq: Once | INTRAVENOUS | Status: AC
  Administered 2018-06-11: 13:00:00 via INTRAVENOUS
  Filled 2018-06-11: qty 250

## 2018-06-11 MED ORDER — SODIUM CHLORIDE 0.9 % IV SOLN
Freq: Once | INTRAVENOUS | Status: AC
  Administered 2018-06-11: 12:00:00 via INTRAVENOUS
  Filled 2018-06-11: qty 250

## 2018-06-11 MED ORDER — HEPARIN SOD (PORK) LOCK FLUSH 100 UNIT/ML IV SOLN
500.0000 [IU] | Freq: Once | INTRAVENOUS | Status: AC
  Administered 2018-06-11: 500 [IU] via INTRAVENOUS

## 2018-06-11 NOTE — Addendum Note (Signed)
Addended by: Altha Harm R on: 06/11/2018 01:28 PM   Modules accepted: Orders

## 2018-06-11 NOTE — Telephone Encounter (Signed)
-----   Message from Irving Copas., MD sent at 06/11/2018  3:31 PM EDT ----- Regarding: RE: Mutual Patient That sounds good.If she gets a CT scan done the week before then she can be scheduled on any day that next week as first case while I am the hospital doc. Thanks.GM ----- Message ----- From: Dalene Seltzer, RN Sent: 06/11/2018   3:20 PM EDT To: Irving Copas., MD Subject: RE: Mutual Patient                             You are hospital doc the week of 5/10, do you want her scheduled that week? ----- Message ----- From: Irving Copas., MD Sent: 06/11/2018   3:01 PM EDT To: Stark Klein, MD, Lloyd Huger, MD, # Subject: RE: Mutual Patient                             Thanks for the update. Patty or Freight forwarder, this patient needs a CT-Abdomen with IV contrast. It should be done within the next 2-weeks. Please schedule the patient for an EGD with stent removal 60 minute case in 3-weeks time (about 1-week after the CT scan). Let me know if any issues, we need to remove the AXIOS stent if we can before her risk of bleeding increases or we have issues removing. EGD at [facility]. Thanks. GM ----- Message ----- From: Lloyd Huger, MD Sent: 06/11/2018   2:53 PM EDT To: Stark Klein, MD, Irving Copas., MD Subject: RE: Mutual Patient                             I have not schedule any imaging at this time.  FYI-  She has declined any further chemotherapy, but has not yet enrolled in hospice.  -Tim ----- Message ----- From: Irving Copas., MD Sent: 06/11/2018   2:47 PM EDT To: Stark Klein, MD, Lloyd Huger, MD Subject: Mutual Patient                                 Dorris Fetch and Timothy,I hope you are both well.I was just thinking about our mutual patient.  It is about time for [place] to plan a repeat scan to see if we are able to try and remove her cyst gastrostomy stent.Wanted to check in with you all as to whether you are planning  any cross-sectional imaging of her abdomen.I also read that she is now considering hospice but is not up to that point yet in her decision tree.Either way, I think we need to get that axial stent removed sooner rather than later just due to the bleeding risk.Let me know about what your plans are for any cross-sectional imaging and then I will order any if you were not planning on having any done.  Appreciate you guys help as always.All the best.Gabe

## 2018-06-11 NOTE — Progress Notes (Addendum)
[place]  Telephone:([phone] [phone]   Name: Cindy Bowman Date: 06/11/2018 MRN: [id number]  DOB: 1950-02-22  Patient Care Team: Glendon Axe, MD as PCP - General (Internal Medicine) Clent Jacks, RN as Registered Nurse Lloyd Huger, MD as Medical Oncologist (Medical Oncology)    REASON FOR CONSULTATION: Palliative Care consult requested for this67 y.o.femalewith multiple medical problems including  adenocarcinoma of the pancreas (diagnosed 09/2017) s/p neoadjuvant FOLFIRINOX. Patient also found to have a L. Renal mass suspicious for RCC. Patientdeveloped necrotizing pancreatitis after fiduciary placement procedure at [facility] which was subsequently managed conservatively. She was hospitalized2/14/20 to 04/09/18 with intractable abdominal pain. CT scan revealed large pancreatic pseudocyst without evidence of further necrosis. She was  Managed conservatively.Patient was hospitalized again 05/09/18 to 05/23/18 with gastric outlet obstruction and an enlarging pancreatic pseudocyst. 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 Patient has been referred to palliative care to help with symptom management andaddress treatmentgoals.  SOCIAL HISTORY:    Patient is divorced.  She has one son with whom she lives.  Patient moved here from [place].  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  ADVANCE DIRECTIVES:  Patient says her son is her healthcare power of attorney.  Patient says she has a living will.  CODE STATUS: DNR (MOST completed on 12/01/18)  PAST MEDICAL HISTORY: Past Medical History:  Diagnosis Date   Anemia    Complication of anesthesia    Difficult airway for intubation    2018 Oophorectomy, CRNA unable to visualized vocal cords with MAC 3 bladed. Easy ventilations O2 sats 99%. Rolls placed under the shoulders and pillows under the head  Repeat Laryngoscopy with MAC 3 blade > Visualization not much better. O2 sats 99%. Intubation with Glidescope on the Second Attempt. BBSE good ETCO 2 O 2 sats 99%.    Essential hypertension    Pancreatic cancer (HCC)    PONV (postoperative nausea and vomiting)    UTI (urinary tract infection)     PAST SURGICAL HISTORY:  Past Surgical History:  Procedure Laterality Date   BIOPSY  05/16/2018   Procedure: BIOPSY;  Surgeon: Rush Landmark, Telford Nab., MD;  Location: [facility];  Service: Gastroenterology;;   CESAREAN SECTION     CHOLECYSTECTOMY     ESOPHAGOGASTRODUODENOSCOPY (EGD) WITH PROPOFOL N/A 05/16/2018   Procedure: ESOPHAGOGASTRODUODENOSCOPY (EGD) WITH PROPOFOL;  Surgeon: Irving Copas., MD;  Location: [facility];  Service: Gastroenterology;  Laterality: N/A;   EUS N/A 10/25/2017   Procedure: FULL UPPER ENDOSCOPIC ULTRASOUND (EUS) RADIAL;  Surgeon: Jola Schmidt, MD;  Location: [facility] END[facility];  Service: Endoscopy;  Laterality: N/A;   IR RADIOLOGIST EVAL & MGMT  05/08/2018   LAPAROSCOPY N/A 05/09/2018   Procedure: LAPAROSCOPY DIAGNOSTIC AND J TUBE PLACEMENT (FEEDING TUBE);  Surgeon: Stark Klein, MD;  Location: [facility];  Service: General;  Laterality: N/A;   OOPHORECTOMY     PANCREATIC STENT PLACEMENT N/A 05/16/2018   Procedure: Zara Chess PLACEMENT;  Surgeon: Irving Copas., MD;  Location: [facility];  Service: Gastroenterology;  Laterality: N/A;  Hobbs Double Pigtail stent placed Balloon Dialtion of Cyst opening Cautery used to open cyst wall   PORTA CATH INSERTION N/A 11/12/2017   Procedure: PORTA CATH INSERTION;  Surgeon: Algernon Huxley, MD;  Location: [place] [facility];  Service: Cardiovascular;  Laterality: N/A;   TUBAL LIGATION     UPPER ESOPHAGEAL ENDOSCOPIC ULTRASOUND (EUS) N/A 05/16/2018  Procedure: UPPER ESOPHAGEAL ENDOSCOPIC ULTRASOUND (EUS);  Surgeon: Irving Copas., MD;  Location: [facility];  Service: Gastroenterology;   Laterality: N/A;    HEMATOLOGY/ONCOLOGY HISTORY:    Malignant neoplasm of head of pancreas ([place])   10/18/2017 Initial Diagnosis    Pancreatic adenocarcinoma ([place])    10/26/2017 Cancer Staging    Staging form: Exocrine Pancreas, AJCC 8th Edition - Clinical stage from 10/26/2017: Stage IB (cT2, cN0, cM0) - Signed by Lloyd Huger, MD on 10/26/2017    11/06/2017 - 12/11/2017 Chemotherapy    The patient had palonosetron (ALOXI) injection 0.25 mg, 0.25 mg, Intravenous,  Once, 2 of 4 cycles Administration: 0.25 mg (11/14/2017), 0.25 mg (11/28/2017) irinotecan (CAMPTOSAR) 340 mg in dextrose 5 % 500 mL chemo infusion, 180 mg/m2 = 340 mg, Intravenous,  Once, 2 of 4 cycles Dose modification: 160 mg/m2 (original dose 180 mg/m2, Cycle 2, Reason: Dose not tolerated) Administration: 340 mg (11/14/2017), 300 mg (11/28/2017) leucovorin 750 mg in dextrose 5 % 250 mL infusion, 760 mg, Intravenous,  Once, 2 of 4 cycles Dose modification: 360 mg/m2 (original dose 400 mg/m2, Cycle 2, Reason: Dose not tolerated) Administration: 750 mg (11/14/2017), 700 mg (11/28/2017) oxaliplatin (ELOXATIN) 150 mg in dextrose 5 % 500 mL chemo infusion, 160 mg, Intravenous,  Once, 2 of 4 cycles Dose modification: 70 mg/m2 (original dose 85 mg/m2, Cycle 2, Reason: Dose not tolerated) Administration: 150 mg (11/14/2017), 135 mg (11/28/2017) fluorouracil (ADRUCIL) chemo injection 750 mg, 400 mg/m2 = 750 mg, Intravenous,  Once, 2 of 4 cycles Dose modification: 360 mg/m2 (original dose 400 mg/m2, Cycle 2, Reason: Dose not tolerated) Administration: 750 mg (11/14/2017), 700 mg (11/28/2017) fluorouracil (ADRUCIL) 4,550 mg in sodium chloride 0.9 % 59 mL chemo infusion, 2,400 mg/m2 = 4,550 mg, Intravenous, 1 Day/Dose, 2 of 4 cycles Dose modification: 2,000 mg/m2 (original dose 2,400 mg/m2, Cycle 2, Reason: Dose not tolerated) Administration: 4,550 mg (11/14/2017), 3,800 mg (11/28/2017)  for chemotherapy treatment.     12/26/2017 -  Chemotherapy      The patient had gemcitabine (GEMZAR) 1,800 mg in sodium chloride 0.9 % 250 mL chemo infusion, 1,786 mg, Intravenous,  Once, 3 of 4 cycles Administration: 1,800 mg (12/26/2017), 1,800 mg (01/02/2018), 1,800 mg (01/09/2018), 1,800 mg (01/23/2018), 1,800 mg (01/30/2018)  for chemotherapy treatment.      ALLERGIES:  is allergic to nsaids; ibuprofen; and penicillins.  MEDICATIONS:  Current Outpatient Medications  Medication Sig Dispense Refill   acetaminophen (TYLENOL) 500 MG tablet Take 1 tablet by mouth every 6 (six) hours as needed.     apixaban (ELIQUIS) 5 MG TABS tablet Take 1 tablet (5 mg total) by mouth 2 (two) times daily. 60 tablet 3   bacitracin-polymyxin b (POLYSPORIN) ophthalmic ointment Place 2 application into both eyes 2 (two) times daily.     ciprofloxacin (CIPRO) 250 MG tablet Take 1 tablet (250 mg total) by mouth 2 (two) times daily. 28 tablet 0   clotrimazole-betamethasone (LOTRISONE) cream 1 application 2 (two) times daily.     dexamethasone (DECADRON) 4 MG tablet Take 1 tablet (4 mg total) by mouth daily. 30 tablet 1   fentaNYL (DURAGESIC) 75 MCG/HR Place 1 patch onto the skin every 3 (three) days. 5 patch 0   folic acid (FOLVITE) 1 MG tablet TAKE 1 TABLET BY MOUTH EVERY DAY (Patient taking differently: Take 1 mg by mouth daily. ) 90 tablet 1   gabapentin (NEURONTIN) 300 MG capsule Take 1 capsule (300 mg total) by mouth at bedtime. 90 capsule 0  lidocaine-prilocaine (EMLA) cream Apply 1 application topically as needed. (Patient taking differently: Apply 1 application topically as needed (port access). ) 30 g 2   lipase/protease/amylase (CREON) 36000 UNITS CPEP capsule Take 36,000-72,000 Units by mouth See admin instructions. Take 36000 units with small meals and 72000 units with big meals     loperamide (IMODIUM) 2 MG capsule Take 2 mg by mouth as needed for diarrhea or loose stools.      losartan-hydrochlorothiazide (HYZAAR) 50-12.5 MG tablet Take 1 tablet by  mouth 1 day or 1 dose.     NARCAN 4 MG/0.1ML LIQD nasal spray kit Place 1 spray into the nose as needed (opioid overdose).      Nutritional Supplements (FEEDING SUPPLEMENT, VITAL 1.5 CAL,) LIQD Give vital 1.5 41m/hr using continous pump for 24 hours via J-tube. Flush tube with water 1861m4 times per day. 1200 mL 2   ondansetron (ZOFRAN-ODT) 4 MG disintegrating tablet Take 1 tablet by mouth every 8 (eight) hours as needed.     oxyCODONE (ROXICODONE) 15 MG immediate release tablet Take 1-2 tablets (15-30 mg total) by mouth every 4 (four) hours as needed for pain. 90 tablet 0   potassium chloride (KLOR-CON SPRINKLE) 10 MEQ CR capsule Take 2 capsules (20 mEq total) by mouth daily. May open capsule to sprinkle on food. 30 capsule 0   prochlorperazine (COMPAZINE) 5 MG tablet Take 1 tablet (5 mg total) by mouth every 6 (six) hours as needed for nausea or vomiting. 30 tablet 0   promethazine (PHENERGAN) 12.5 MG suppository Place 1 suppository (12.5 mg total) rectally every 6 (six) hours as needed for nausea or vomiting. 12 each 0   promethazine (PHENERGAN) 12.5 MG tablet Take 12.5 mg by mouth every 6 (six) hours as needed for nausea or vomiting.     tiZANidine (ZANAFLEX) 2 MG tablet Take 1 tablet (2 mg total) by mouth every 8 (eight) hours as needed for muscle spasms. 30 tablet 0   traMADol (ULTRAM) 50 MG tablet Take 1 tablet by mouth at bedtime.     No current facility-administered medications for this visit.    Facility-Administered Medications Ordered in Other Visits  Medication Dose Route Frequency Provider Last Rate Last Dose   0.9 %  sodium chloride infusion   Intravenous Once AlVerlon AuNP       prochlorperazine (COMPAZINE) injection 10 mg  10 mg Intravenous Once RoRexene Agent      VITAL SIGNS: There were no vitals taken for this visit. There were no vitals filed for this visit.  Estimated body mass index is 21.43 kg/m as calculated from the following:   Height as of  05/16/18: _0  (1.6 m).   Weight as of 06/04/18: 121 lb (54.9 kg).  LABS: CBC:    Component Value Date/Time   WBC 16.2 (H) 06/11/2018 1122   HGB 8.8 (L) 06/11/2018 1122   HCT 27.3 (L) 06/11/2018 1122   PLT 290 06/11/2018 1122   MCV 92.2 06/11/2018 1122   NEUTROABS 10.6 (H) 06/11/2018 1122   LYMPHSABS 3.0 06/11/2018 1122   MONOABS 2.3 (H) 06/11/2018 1122   EOSABS 0.1 06/11/2018 1122   BASOSABS 0.0 06/11/2018 1122   Comprehensive Metabolic Panel:    Component Value Date/Time   NA 137 06/04/2018 0946   K 3.7 06/04/2018 0946   CL 108 06/04/2018 0946   CO2 17 (L) 06/04/2018 0946   BUN 15 06/04/2018 0946   CREATININE 1.07 (H) 06/04/2018 0946   GLUCOSE  79 06/04/2018 0946   CALCIUM 8.2 (L) 06/04/2018 0946   AST 26 06/04/2018 0946   ALT 21 06/04/2018 0946   ALKPHOS 191 (H) 06/04/2018 0946   BILITOT 0.8 06/04/2018 0946   PROT 7.1 06/04/2018 0946   ALBUMIN 2.1 (L) 06/04/2018 0946    RADIOGRAPHIC STUDIES: Ct Abdomen Wo Contrast  Result Date: 05/20/2018 CLINICAL DATA:  s/p Procedure(s):LAPAROSCOPY DIAGNOSTIC AND J TUBE PLACEMENT (FEEDING TUBE) 3/19/2020Pt c/o N/V today EXAM: CT ABDOMEN WITHOUT CONTRAST TECHNIQUE: Multidetector CT imaging of the abdomen was performed following the standard protocol without IV contrast. COMPARISON:  Radiographs, 05/19/2018. Prior abdomen and pelvis CTs, most recent dated 05/10/2018. FINDINGS: Lower chest: Lower minimal effusions lobe. Dependent lung base opacity mostly in the lower lobes consistent with atelectasis. Both have improved from the prior CT. Hepatobiliary: Diffusely decreased attenuation of the liver consistent with fatty infiltration. No discrete liver mass. Status post cholecystectomy. No bile duct dilation. Pancreas: Hypoattenuating mass, uncinate process, unchanged from the most recent prior study, less well-defined due to lack of contrast. The large pseudocyst noted along the anterior pancreatic body and tail is significantly decreased in  size subsequent to placement of the double pigtail catheter extending from the stomach into the pseudocyst. Cyst now measures approximately 7.7 cm in long axis by 2.0 x 1.4 cm. Spleen: Normal in size without focal abnormality. Adrenals/Urinary Tract: No adrenal masses. Midpole left renal mass, 3.1 cm in greatest dimension, unchanged from the prior study. No new renal masses, no stones and no hydronephrosis. Visualized ureters are unremarkable. Stomach/Bowel: Double pigtail catheter extends from the mid stomach across the posterior gastric wall into the pancreatic pseudocyst, well-positioned and significantly decompressing the pseudocyst. Apparent wall thickening of the distal stomach, which may be reactive to the underlying pancreatic inflammation. Well-positioned jejunostomy tube stable from the prior CT. Mild distention proximal small bowel loops, maximum diameter 3.7 cm no defined transition point, but the more distal small bowel is decompressed. No small bowel wall thickening. Visualized colon is mostly decompressed. No wall thickening or adjacent inflammation. Vascular/Lymphatic: No aneurysm.  No discrete enlarged lymph nodes. Other: Trace amount of ascites primarily seen adjacent to the liver, increased from the previous CT scan. Musculoskeletal: No osteoblastic or osteolytic lesions. IMPRESSION: 1. Large pseudocyst noted on the prior CT along the anterior margin of the pancreatic body and tail has been significant the decompressed following placement a double pigtail catheter extending from the stomach into the pseudocyst, through the stomach posterior wall. 2. Trace amount of ascites which is increased compared to the prior CT. 3. Mild interval decrease in pleural effusions and dependent lung base atelectasis since the prior CT. 4. Mild dilation of proximal small bowel. This may reflect an adynamic ileus or a partial obstruction. A discrete transition point is not defined, however. 5. No other changes from  the recent prior CT. Pancreatic mass is less well-defined due to lack of intravenous contrast. Left renal mass is stable. Electronically Signed   By: Lajean Manes M.D.   On: 05/20/2018 16:07   Dg Abd 1 View  Result Date: 05/17/2018 CLINICAL DATA:  Nausea.  Cystogastrostomy stent placement EXAM: ABDOMEN - 1 VIEW COMPARISON:  May 11, 2018 FINDINGS: Catheter tip is in the left mid abdomen. There is mild dilatation of the rectum due to fluid and stool. Elsewhere, there is no appreciable bowel dilatation. No air-fluid level. There are surgical clips in the upper abdomen. A drain is noted in the medial left upper abdomen. No free air evident. IMPRESSION: Catheter  tip in lateral left mid abdomen region. Drain and left upper quadrant in or overlying the stomach. Rectum distended with fluid and stool. No bowel obstruction or free air demonstrable. Electronically Signed   By: Lowella Grip III M.D.   On: 05/17/2018 08:26   Dg Abd 2 Views  Result Date: 05/19/2018 CLINICAL DATA:  Pancreatic carcinoma, nausea and vomiting. EXAM: ABDOMEN - 2 VIEW COMPARISON:  05/17/2018 FINDINGS: The Axios stent cyst gastrostomy with double pigtail catheter appears stable in position. There is an elongated air-fluid level in the stomach. Jejunostomy tube shows stable positioning. Bowel-gas shows mild prominence of a few small bowel loops which may be consistent with a component of ileus/enteritis. No overt small bowel obstruction identified. No evidence of free intraperitoneal air. IMPRESSION: 1. Stable positioning of cyst gastrostomy stent with traversing double pigtail catheter. There is an elongated air-fluid level in the stomach likely related to fluid distention of the stomach. 2. Mild gaseous prominence of a few small bowel loops which could be consistent with a component of ileus/enteritis. No overt small bowel obstruction. 3. Stable positioning of jejunostomy tube. Electronically Signed   By: Aletta Edouard M.D.   On:  05/19/2018 11:50    PERFORMANCE STATUS (ECOG) : 3 - Symptomatic, >50% confined to bed  Review of Systems Unless otherwise noted, a complete review of systems is negative.  Physical Exam General: NAD, frail appearing, thin Pulmonary: unlabored Abdomen: J tube noted but not visualized  Skin: no rashes, no edema Neurological: Weakness but otherwise nonfocal  IMPRESSION: Patient was seen for routine follow-up in the clinic.   Patient continues to have poor tolerance of her TFs. She is being actively followed by RD. Patient says her diarrhea is improved but she is receiving minimal (2 small bottles) of feeding each night. She subsequently feels weak and deconditioned.   Patient says she continues to use oxycodone 3-4 times per day and that it is helping her abdominal pain. She also continues to use fentanyl TD every 3 days.   Patient continues to endorse crampy abdominal pain associated with TF. She has tried Creon but says she did not tolerate but does not elucidate on why.   Discussed options with Dr. Grayland Ormond. Will start trial of Bentyl.   PLAN: -Best supportive care -Start trial of Bentyl 79m TID AC -RTC in 1-2 weeks    Patient expressed understanding and was in agreement with this plan. She also understands that She can call clinic at any time with any questions, concerns, or complaints.     Time Total: 30 minutes  Visit consisted of counseling and education dealing with the complex and emotionally intense issues of symptom management and palliative care in the setting of serious and potentially life-threatening illness.Greater than 50%  of this time was spent counseling and coordinating care related to the above assessment and plan.  Signed by: JAltha Harm PhD, NP-C [phone](Work Cell)

## 2018-06-11 NOTE — Telephone Encounter (Signed)
Order in for CT, left message at [facility] CT to call back to schedule.

## 2018-06-12 ENCOUNTER — Telehealth: Payer: Self-pay | Admitting: Oncology

## 2018-06-12 LAB — CANCER ANTIGEN 19-9: CA 19-9: 497 U/mL — ABNORMAL HIGH (ref 0–35)

## 2018-06-12 NOTE — Telephone Encounter (Signed)
CT scheduled at [facility] for 06/26/18 at 10:30 am. Will report to the patient to arrive 30 minutes prior to drink the water based contrast that will be provided at [facility] CT for the pancreatic protocol.   EGD with stent removal scheduled at [facility] on 07/02/18 (60 minute case) at 7:30 am. Will report to the patient to arrive one hour early with a family member who will wait in the car on hospital grounds.   Left message for patient to call back.

## 2018-06-13 ENCOUNTER — Other Ambulatory Visit: Payer: Self-pay

## 2018-06-13 ENCOUNTER — Inpatient Hospital Stay: Payer: Medicare Other

## 2018-06-13 ENCOUNTER — Telehealth: Payer: Self-pay

## 2018-06-13 DIAGNOSIS — C250 Malignant neoplasm of head of pancreas: Secondary | ICD-10-CM

## 2018-06-13 NOTE — Telephone Encounter (Signed)
Nutrition  RD working remotely.  Called son, Rinell at 10:08 am today for nutrition follow.   No answer.  Left message on voicemail to return RD's call.    Mesa Janus B. Zenia Resides, [place], [place] Registered Dietitian [phone] (pager)

## 2018-06-14 ENCOUNTER — Telehealth: Payer: Self-pay

## 2018-06-14 ENCOUNTER — Telehealth: Payer: Self-pay | Admitting: *Deleted

## 2018-06-14 NOTE — Telephone Encounter (Signed)
Order was faxed to [facility].

## 2018-06-14 NOTE — Telephone Encounter (Signed)
Nutrition  Left message yesterday 4/23 for son to return RD call regarding nutrition follow-up for patient.  No return call so called this am.  No answer.  Left another message with RD callback number.  Tynslee Bowlds B. Zenia Resides, [place], [place] Registered Dietitian [phone] (pager)

## 2018-06-14 NOTE — Telephone Encounter (Signed)
I faxed an order to [facility] for patient to receive her wheelchair since her insurance will only pay for one item every 5 years. Patient's son was notified and agreed.

## 2018-06-14 NOTE — Telephone Encounter (Signed)
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  Patient s/p laparoscopy/J tube placement/hernia repair on 3/19 with Dr. Barry Dienes. During admission patient also found to have pancreatic pseudocyst causing gastric outlet obstruction and stent was placed.   Patient is followed by Dr. Grayland Ormond and Merrily Pew, Palliative NP.     RD working remotely.  Son returned RD's call.  Reports that patient has not had any tube feeding since Monday.  Reports that vital 1.5 was at 51ml/hr and ran for about 6-7 hours before patient wanted to turn tube feeding off due to abdominal pain, cramping.  Son reports 2 diarrhea episodes during this time frame that tube feeding was on.  Reports that patient has had about 2 "mushy" stool each day this week since tube feeding has been off.  Reports that patient has eaten broth, carnation breakfast essentials and ate little bit of rice and also complains of abdominal pain when eating.  Son reports that patient was not able to take creon orally.    Son has not been able to get bentyl as insurance has not approved it yet.  Hopeful, he will get it today.   Son reports that he is not aware of any follow-up with GI doctor at this time  Medications: fentanyl, imodium, zofran, KCL, compazine, phenergan  Labs: reviewed  Anthropometrics:   Weight 121 lb noted on 4/14.     Estimated Energy Needs  Kcals: 1620-1890 calories Protein: 81-94 g Fluid: 1.8 L  NUTRITION DIAGNOSIS: Malnutrition continues   MALNUTRITION DIAGNOSIS: severe malnutrition continues   INTERVENTION:  RD started patient off on osmolite 1.2 (same formula inpatient) and has increased diarrhea (6-7+ times per day).  Imodium has been added along with banatrol plus to help with diarrhea.  Rate of tube feeding was reduced.  Tube feeding formula has been changed to vital 1.5 (peptide-based formula for patients experiencing malabsorption, maldigestion and GI intolerance).   Patient unable to have feeding at rate of 15-33ml/hr due to abdominal pain, cramping, bloating. Patient tried creon without success. Bentyl prescribed but insurance has not approved yet.  RD unable to explain why patient is not tolerating tube feeding at higher rate.  Question if checking c-diff is appropriate or further abdominal imaging will be revealing.  Will discuss with MD and NP.  Current tube feeding regimen is considered trickle feeding and not meeting patient's nutritional needs.  Oral intake is also not meeting patient's nutritional needs.  This has been discussed with patient, son and MD and NP.     MONITORING, EVALUATION, GOAL: TF tolerance, weight   NEXT VISIT: phone f/u on Monday, April 27  Macayla Ekdahl B. Zenia Resides, [place], [place] Registered Dietitian [phone] (pager)

## 2018-06-14 NOTE — Telephone Encounter (Signed)
Nurse with [facility] asking for DME orders be sent for a Rollator and a wheelchair for htis patient. Please send orders

## 2018-06-17 ENCOUNTER — Telehealth: Payer: Self-pay

## 2018-06-17 ENCOUNTER — Encounter: Payer: Self-pay | Admitting: *Deleted

## 2018-06-17 NOTE — Progress Notes (Signed)
[facility]  [phone] [phone]  ID: Cindy Bowman OB: [birth year]  MR#: [id number]  JIR#:[id number]  Patient Care Team: Glendon Axe, MD as PCP - General (Internal Medicine) Clent Jacks, RN as Registered Nurse Lloyd Huger, MD as Medical Oncologist (Medical Oncology)  CHIEF COMPLAINT: Clinical stage IV pancreatic adenocarcinoma.  INTERVAL HISTORY: Patient returns to clinic today for repeat laboratory work, further evaluation, and continuation of IV fluids.  She continues to have significant pain with her tube feeds.  Patient states when she is not receiving tube feeds, her pain is well controlled.  She continues to have increased weakness and fatigue with a decreased performance status.  She continues to have diarrhea with her tube feeds as well.  She has no neurologic complaints. She denies any recent fevers.  She continues to have a poor appetite and weight loss.  She denies any chest pain, shortness of breath, cough, hemoptysis.  She has no urinary complaints.  Patient feels generally terrible, but offers no further specific complaints today.  REVIEW OF SYSTEMS:   Review of Systems  Constitutional: Positive for malaise/fatigue and weight loss. Negative for fever.  HENT: Negative.  Negative for congestion and sinus pain.   Respiratory: Negative.  Negative for cough and shortness of breath.   Cardiovascular: Negative for chest pain and leg swelling.  Gastrointestinal: Positive for abdominal pain, diarrhea and nausea. Negative for blood in stool, melena and vomiting.  Genitourinary: Negative.  Negative for dysuria.  Musculoskeletal: Negative.  Negative for back pain.  Skin: Negative.  Negative for rash.  Neurological: Positive for weakness. Negative for dizziness, focal weakness and headaches.  Psychiatric/Behavioral: Negative.  The patient is not nervous/anxious.     As per HPI. Otherwise, a complete review of systems is  negative.  PAST MEDICAL HISTORY: Past Medical History:  Diagnosis Date   Anemia    Complication of anesthesia    Difficult airway for intubation    2018 Oophorectomy, CRNA unable to visualized vocal cords with MAC 3 bladed. Easy ventilations O2 sats 99%. Rolls placed under the shoulders and pillows under the head Repeat Laryngoscopy with MAC 3 blade > Visualization not much better. O2 sats 99%. Intubation with Glidescope on the Second Attempt. BBSE good ETCO 2 O 2 sats 99%.    Essential hypertension    Pancreatic cancer (HCC)    PONV (postoperative nausea and vomiting)    UTI (urinary tract infection)     PAST SURGICAL HISTORY: Past Surgical History:  Procedure Laterality Date   BIOPSY  05/16/2018   Procedure: BIOPSY;  Surgeon: Rush Landmark, Telford Nab., MD;  Location: [facility];  Service: Gastroenterology;;   CESAREAN SECTION     CHOLECYSTECTOMY     ESOPHAGOGASTRODUODENOSCOPY (EGD) WITH PROPOFOL N/A 05/16/2018   Procedure: ESOPHAGOGASTRODUODENOSCOPY (EGD) WITH PROPOFOL;  Surgeon: Irving Copas., MD;  Location: [facility];  Service: Gastroenterology;  Laterality: N/A;   EUS N/A 10/25/2017   Procedure: FULL UPPER ENDOSCOPIC ULTRASOUND (EUS) RADIAL;  Surgeon: Jola Schmidt, MD;  Location: [facility];  Service: Endoscopy;  Laterality: N/A;   IR RADIOLOGIST EVAL & MGMT  05/08/2018   LAPAROSCOPY N/A 05/09/2018   Procedure: LAPAROSCOPY DIAGNOSTIC AND J TUBE PLACEMENT (FEEDING TUBE);  Surgeon: Stark Klein, MD;  Location: [facility];  Service: General;  Laterality: N/A;   OOPHORECTOMY     PANCREATIC STENT PLACEMENT N/A 05/16/2018   Procedure: Zara Chess PLACEMENT;  Surgeon: Irving Copas., MD;  Location: [facility];  Service: Gastroenterology;  Laterality: N/A;  Hobbs Double Pigtail stent placed Balloon Dialtion of Cyst opening Cautery used to open cyst wall   PORTA CATH INSERTION N/A 11/12/2017   Procedure: PORTA CATH INSERTION;  Surgeon: Algernon Huxley,  MD;  Location: [facility];  Service: Cardiovascular;  Laterality: N/A;   TUBAL LIGATION     UPPER ESOPHAGEAL ENDOSCOPIC ULTRASOUND (EUS) N/A 05/16/2018   Procedure: UPPER ESOPHAGEAL ENDOSCOPIC ULTRASOUND (EUS);  Surgeon: Irving Copas., MD;  Location: [facility];  Service: Gastroenterology;  Laterality: N/A;    FAMILY HISTORY: Family History  Problem Relation Age of Onset   Hypertension Mother        deceased 29   Heart attack Father        deceased late 54s    ADVANCED DIRECTIVES (Y/N):  N  HEALTH MAINTENANCE: Social History   Tobacco Use   Smoking status: Never Smoker   Smokeless tobacco: Never Used  Substance Use Topics   Alcohol use: Not Currently   Drug use: Never     Colonoscopy:  PAP:  Bone density:  Lipid panel:  Allergies  Allergen Reactions   Nsaids Other (See Comments)    Decreased GFR   Ibuprofen     Lowers kidney function Other reaction(s): Kidney Disorder   Penicillins     Yeast infection Did it involve swelling of the face/tongue/throat, SOB, or low BP? No Did it involve sudden or severe rash/hives, skin peeling, or any reaction on the inside of your mouth or nose? No Did you need to seek medical attention at a hospital or doctor's office? No When did it last happen?Unknown If all above answers are NO, may proceed with cephalosporin use.      Current Outpatient Medications  Medication Sig Dispense Refill   acetaminophen (TYLENOL) 500 MG tablet Take 1 tablet by mouth every 6 (six) hours as needed.     apixaban (ELIQUIS) 5 MG TABS tablet Take 1 tablet (5 mg total) by mouth 2 (two) times daily. 60 tablet 3   bacitracin-polymyxin b (POLYSPORIN) ophthalmic ointment Place 2 application into both eyes 2 (two) times daily.     clotrimazole-betamethasone (LOTRISONE) cream 1 application 2 (two) times daily.     fentaNYL (DURAGESIC) 75 MCG/HR Place 1 patch onto the skin every 3 (three) days. 5 patch 0   folic  acid (FOLVITE) 1 MG tablet TAKE 1 TABLET BY MOUTH EVERY DAY (Patient taking differently: Take 1 mg by mouth daily. ) 90 tablet 1   gabapentin (NEURONTIN) 300 MG capsule Take 1 capsule (300 mg total) by mouth at bedtime. 90 capsule 0   lidocaine-prilocaine (EMLA) cream Apply 1 application topically as needed. (Patient taking differently: Apply 1 application topically as needed (port access). ) 30 g 2   lipase/protease/amylase (CREON) 36000 UNITS CPEP capsule Take 36,000-72,000 Units by mouth See admin instructions. Take 36000 units with small meals and 72000 units with big meals     loperamide (IMODIUM) 2 MG capsule Take 2 mg by mouth as needed for diarrhea or loose stools.      losartan-hydrochlorothiazide (HYZAAR) 50-12.5 MG tablet Take 1 tablet by mouth 1 day or 1 dose.     NARCAN 4 MG/0.1ML LIQD nasal spray kit Place 1 spray into the nose as needed (opioid overdose).      Nutritional Supplements (FEEDING SUPPLEMENT, VITAL 1.5 CAL,) LIQD Give vital 1.5 75m/hr using continous pump for 24 hours via J-tube. Flush tube with water 1865m4 times per day. 1200 mL 2   ondansetron (ZOFRAN-ODT)  4 MG disintegrating tablet Take 1 tablet by mouth every 8 (eight) hours as needed.     oxyCODONE (ROXICODONE) 15 MG immediate release tablet Take 1-2 tablets (15-30 mg total) by mouth every 4 (four) hours as needed for pain. 90 tablet 0   potassium chloride (KLOR-CON SPRINKLE) 10 MEQ CR capsule Take 2 capsules (20 mEq total) by mouth daily. May open capsule to sprinkle on food. 30 capsule 0   prochlorperazine (COMPAZINE) 5 MG tablet Take 1 tablet (5 mg total) by mouth every 6 (six) hours as needed for nausea or vomiting. 30 tablet 0   promethazine (PHENERGAN) 12.5 MG suppository Place 1 suppository (12.5 mg total) rectally every 6 (six) hours as needed for nausea or vomiting. 12 each 0   promethazine (PHENERGAN) 12.5 MG tablet Take 12.5 mg by mouth every 6 (six) hours as needed for nausea or vomiting.      tiZANidine (ZANAFLEX) 2 MG tablet Take 1 tablet (2 mg total) by mouth every 8 (eight) hours as needed for muscle spasms. 30 tablet 0   traMADol (ULTRAM) 50 MG tablet Take 1 tablet by mouth at bedtime.     dexamethasone (DECADRON) 4 MG tablet Take 1 tablet (4 mg total) by mouth daily. (Patient not taking: Reported on 06/18/2018) 30 tablet 1   dicyclomine (BENTYL) 20 MG tablet Take 1 tablet (20 mg total) by mouth 3 (three) times daily before meals. (Patient not taking: Reported on 06/18/2018) 90 tablet 0   No current facility-administered medications for this visit.    Facility-Administered Medications Ordered in Other Visits  Medication Dose Route Frequency Provider Last Rate Last Dose   prochlorperazine (COMPAZINE) injection 10 mg  10 mg Intravenous Once Rexene Agent       sodium chloride flush (NS) 0.9 % injection 10 mL  10 mL Intravenous PRN Lloyd Huger, MD   10 mL at 06/18/18 0954    OBJECTIVE: Vitals:   06/18/18 1012  BP: (!) 144/63  Pulse: 74  Temp: 97.8 F (36.6 C)     Body mass index is 20.9 kg/m.    ECOG FS:2 - Symptomatic, <50% confined to bed  General: Thin, ill-appearing, no acute distress. Eyes: Pink conjunctiva, anicteric sclera. HEENT: Normocephalic, moist mucous membranes. Lungs: Clear to auscultation bilaterally. Heart: Regular rate and rhythm. No rubs, murmurs, or gallops. Abdomen: Soft, nontender, nondistended. No organomegaly noted, normoactive bowel sounds.  J-tube noted. Musculoskeletal: No edema, cyanosis, or clubbing. Neuro: Alert, answering all questions appropriately. Cranial nerves grossly intact. Skin: No rashes or petechiae noted. Psych: Normal affect.  LAB RESULTS:  Lab Results  Component Value Date   NA 136 06/18/2018   K 3.0 (L) 06/18/2018   CL 104 06/18/2018   CO2 22 06/18/2018   GLUCOSE 84 06/18/2018   BUN 13 06/18/2018   CREATININE 0.95 06/18/2018   CALCIUM 8.2 (L) 06/18/2018   PROT 6.7 06/18/2018   ALBUMIN 2.2 (L) 06/18/2018    AST 59 (H) 06/18/2018   ALT 51 (H) 06/18/2018   ALKPHOS 302 (H) 06/18/2018   BILITOT 0.7 06/18/2018   GFRNONAA >60 06/18/2018   GFRAA >60 06/18/2018    Lab Results  Component Value Date   WBC 17.5 (H) 06/18/2018   NEUTROABS 13.0 (H) 06/18/2018   HGB 8.3 (L) 06/18/2018   HCT 25.4 (L) 06/18/2018   MCV 92.0 06/18/2018   PLT 277 06/18/2018     STUDIES: Ct Abdomen Wo Contrast  Result Date: 05/20/2018 CLINICAL DATA:  s/p Procedure(s):LAPAROSCOPY DIAGNOSTIC AND J  TUBE PLACEMENT (FEEDING TUBE) 3/19/2020Pt c/o N/V today EXAM: CT ABDOMEN WITHOUT CONTRAST TECHNIQUE: Multidetector CT imaging of the abdomen was performed following the standard protocol without IV contrast. COMPARISON:  Radiographs, 05/19/2018. Prior abdomen and pelvis CTs, most recent dated 05/10/2018. FINDINGS: Lower chest: Lower minimal effusions lobe. Dependent lung base opacity mostly in the lower lobes consistent with atelectasis. Both have improved from the prior CT. Hepatobiliary: Diffusely decreased attenuation of the liver consistent with fatty infiltration. No discrete liver mass. Status post cholecystectomy. No bile duct dilation. Pancreas: Hypoattenuating mass, uncinate process, unchanged from the most recent prior study, less well-defined due to lack of contrast. The large pseudocyst noted along the anterior pancreatic body and tail is significantly decreased in size subsequent to placement of the double pigtail catheter extending from the stomach into the pseudocyst. Cyst now measures approximately 7.7 cm in long axis by 2.0 x 1.4 cm. Spleen: Normal in size without focal abnormality. Adrenals/Urinary Tract: No adrenal masses. Midpole left renal mass, 3.1 cm in greatest dimension, unchanged from the prior study. No new renal masses, no stones and no hydronephrosis. Visualized ureters are unremarkable. Stomach/Bowel: Double pigtail catheter extends from the mid stomach across the posterior gastric wall into the pancreatic  pseudocyst, well-positioned and significantly decompressing the pseudocyst. Apparent wall thickening of the distal stomach, which may be reactive to the underlying pancreatic inflammation. Well-positioned jejunostomy tube stable from the prior CT. Mild distention proximal small bowel loops, maximum diameter 3.7 cm no defined transition point, but the more distal small bowel is decompressed. No small bowel wall thickening. Visualized colon is mostly decompressed. No wall thickening or adjacent inflammation. Vascular/Lymphatic: No aneurysm.  No discrete enlarged lymph nodes. Other: Trace amount of ascites primarily seen adjacent to the liver, increased from the previous CT scan. Musculoskeletal: No osteoblastic or osteolytic lesions. IMPRESSION: 1. Large pseudocyst noted on the prior CT along the anterior margin of the pancreatic body and tail has been significant the decompressed following placement a double pigtail catheter extending from the stomach into the pseudocyst, through the stomach posterior wall. 2. Trace amount of ascites which is increased compared to the prior CT. 3. Mild interval decrease in pleural effusions and dependent lung base atelectasis since the prior CT. 4. Mild dilation of proximal small bowel. This may reflect an adynamic ileus or a partial obstruction. A discrete transition point is not defined, however. 5. No other changes from the recent prior CT. Pancreatic mass is less well-defined due to lack of intravenous contrast. Left renal mass is stable. Electronically Signed   By: Lajean Manes M.D.   On: 05/20/2018 16:07    ASSESSMENT: Clinical stage IV pancreatic adenocarcinoma.  PLAN:    1.  Clinical stage IV pancreatic adenocarcinoma: EUS completed on October 25, 2017 confirmed adenocarcinoma.  Neoadjuvant FOLFIRINOX was too toxic for patient, therefore she was switched to single agent gemcitabine.  Patient's most recent imaging with CT of the abdomen with contrast on May 10, 2018  revealed persistent 3 cm mass in the pancreas as well as progressive pseudocyst.  She was also noted to have a 1 cm serosal implant likely consistent with metastatic disease.  During patient's J-tube placement, it was noted that she had metastatic disease in her liver which was biopsied and proven malignant.  She continues to have a significantly decreased performance status.  Patient has decided that she does not want any further treatment with chemotherapy, but is not ready to enroll in hospice.  We will continue laboratory work and  IV fluids once a week for the time being.  Proceed with IV fluids and potassium today.  Return to clinic in 1 week for laboratory work and IV fluids only and then in 2 weeks for further evaluation.   2.  Anemia: Hemoglobin continues to trend down and now is 8.3.  Monitor. 3.  Pain: Continue fentanyl patch to 75 mcg every 3 days.  Continue oxycodone and gabapentin.  Patient was previously given referral for interventional pain clinic for consideration of celiac plexus block.  Appreciate palliative care input. 4.  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 No intervention is needed at this time.   4.  Genetic: Genetic testing is pending at time of dictation. 5.  Chronic renal insufficiency: Patient's creatinine is within normal limits today. 6.  Hypokalemia: Resolved.  Continue oral potassium supplementation.  Patient also received 40 mEq IV potassium today. 7.  Diarrhea: Related to tube feeds.  Follow-up with GI next week as scheduled.  Continue Lomotil as prescribed. 8.  Pancreatic pseudocyst: Appreciate surgical input.  Patient was placed on Cipro as a prophylaxis, but this has been discontinued as a possible etiology of her diarrhea.  Follow-up with GI as above. 9.  Weight loss: Appreciate dietary input.  Continue J-tube feedings as directed. 10.  Left lower extremity DVT: Confirmed by ultrasound.  Continue Eliquis as prescribed.  Patient expressed  understanding and was in agreement with this plan. She also understands that She can call clinic at any time with any questions, concerns, or complaints.   Cancer Staging Malignant neoplasm of head of pancreas [facility]) Staging form: Exocrine Pancreas, AJCC 8th Edition - Clinical stage from 10/26/2017: Stage IB (cT2, cN0, cM0) - Signed by Lloyd Huger, MD on 10/26/2017   Lloyd Huger, MD   06/18/2018 1:40 PM

## 2018-06-17 NOTE — Telephone Encounter (Signed)
Mailed letter with dates, times, and instructions.

## 2018-06-17 NOTE — Telephone Encounter (Signed)
Nutrition Follow-up:  RD working remotely.  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  Patient s/p laparoscopy/J tube placement/hernia repair on 3/19 by Dr. Barry Dienes.  During admission patient also found to have pancreatic pseudocyst causing gastric outlet obstruction and stent was placed.  Patient is followed by Dr. Grayland Ormond and Merrily Pew, Palliative NP.    Called son for nutrition follow-up.  Son reports patient only wanted to try tube feeding on Saturday.  Started vital 1.5 at 53ml/hr at 3pm on Saturday and ran until about 9pm and patient requested feeding to be turned off.  Had about 4 episodes of diarrhea while tube feeding was going.  Had taken banatrol plus and was given 1 episode of imodium.  Son reports patient wanted tube feeding turned off due to diarrhea and abdominal pain, cramping.   Son reports today patient has complained of abdominal pain and did not want to take morning medication.  Reports she has had 2 episodes of diarrhea this am but has not taking any imodium.  Son reports may have to get liquid version as patient not wanting to take pills.    Son reports bentyl insurance still has not approved.    Oral intake remains minimal with broth, bites of chicken noodle soup, fruit.   Medications: reviewed  Labs: no new  Anthropometrics:   No new weight   Estimated Energy Needs  Kcals: 1620-1890 calories Protein: 81-94 g Fluid: 1.8 L  NUTRITION DIAGNOSIS: Malnutrition continues   MALNUTRITION DIAGNOSIS: severe malnutrition continues   INTERVENTION:  Osmolite 1.2 (same formula as inpatient was started as outpatient with increased diarrhea (6-7 + times per day).  Imodium and banatrol plus added and rate of tube feeding reduced. Tube feeding formula changed to vital 1.5 (peptide based formula for patients experiencing malabsorption, maldigestion and GI intolerance.  Patient unable to have feeding advanced beyond 61ml/hr due to diarrhea,  abdominal pain, cramping, bloating.  Patient was unable to tolerate creon.  Bentyl has not yet been approved.  Question if checking c-dff and additional abdominal imaging will be revealing.  Discussed with MD on 4/23.   RD will provide samples of vivonex RTF formula for patient to try (complete elemental formula, low fat).  Discussed with son. Son will start at low rate 15-82ml/hr and monitor tolerance.  Will leave samples with Dr. Gary Fleet RN to give to patient on 4/28.     MONITORING, EVALUATION, GOAL: TF tolerance, weight   NEXT VISIT: phone follow-up Monday, May 4  Cindy Bowman, [place], [place] Registered Dietitian [phone] (pager)

## 2018-06-17 NOTE — Telephone Encounter (Signed)
Called patient for the second time with no answer. Left message for patient to call back.

## 2018-06-17 NOTE — Telephone Encounter (Signed)
-----   Message from Timothy Lasso, RN sent at 05/27/2018  9:19 AM EDT ----- Regarding: FW: Follow up  ----- Message ----- From: Irving Copas., MD Sent: 05/22/2018   3:12 PM EDT To: Milus Banister, MD, Stark Klein, MD, # Subject: Follow up                                      Dear Chong Sicilian or covering RN,Patient will require a follow-up CT abdomen with IV/p.o. contrast to be performed in 2-1/2 to 3 weeks.Please schedule a telehealth visit with me 2 days after the CT so that I may make decision on timing of AXIOS cyst gastrostomy stent removal.Thank you.GM

## 2018-06-18 ENCOUNTER — Inpatient Hospital Stay (HOSPITAL_BASED_OUTPATIENT_CLINIC_OR_DEPARTMENT_OTHER): Payer: Medicare Other | Admitting: Oncology

## 2018-06-18 ENCOUNTER — Telehealth: Payer: Self-pay | Admitting: *Deleted

## 2018-06-18 ENCOUNTER — Encounter: Payer: Self-pay | Admitting: Oncology

## 2018-06-18 ENCOUNTER — Other Ambulatory Visit: Payer: Self-pay

## 2018-06-18 ENCOUNTER — Other Ambulatory Visit: Payer: Self-pay | Admitting: Oncology

## 2018-06-18 ENCOUNTER — Inpatient Hospital Stay: Payer: Medicare Other

## 2018-06-18 VITALS — BP 144/63 | HR 74 | Temp 97.8°F | Wt 118.0 lb

## 2018-06-18 DIAGNOSIS — R531 Weakness: Secondary | ICD-10-CM

## 2018-06-18 DIAGNOSIS — E876 Hypokalemia: Secondary | ICD-10-CM | POA: Diagnosis not present

## 2018-06-18 DIAGNOSIS — C250 Malignant neoplasm of head of pancreas: Secondary | ICD-10-CM

## 2018-06-18 DIAGNOSIS — R630 Anorexia: Secondary | ICD-10-CM

## 2018-06-18 DIAGNOSIS — N289 Disorder of kidney and ureter, unspecified: Secondary | ICD-10-CM

## 2018-06-18 DIAGNOSIS — R5383 Other fatigue: Secondary | ICD-10-CM

## 2018-06-18 DIAGNOSIS — Z931 Gastrostomy status: Secondary | ICD-10-CM | POA: Diagnosis not present

## 2018-06-18 DIAGNOSIS — C259 Malignant neoplasm of pancreas, unspecified: Secondary | ICD-10-CM

## 2018-06-18 DIAGNOSIS — R197 Diarrhea, unspecified: Secondary | ICD-10-CM

## 2018-06-18 DIAGNOSIS — R634 Abnormal weight loss: Secondary | ICD-10-CM

## 2018-06-18 DIAGNOSIS — N189 Chronic kidney disease, unspecified: Secondary | ICD-10-CM | POA: Diagnosis not present

## 2018-06-18 DIAGNOSIS — R52 Pain, unspecified: Secondary | ICD-10-CM

## 2018-06-18 DIAGNOSIS — C787 Secondary malignant neoplasm of liver and intrahepatic bile duct: Secondary | ICD-10-CM

## 2018-06-18 DIAGNOSIS — D649 Anemia, unspecified: Secondary | ICD-10-CM | POA: Diagnosis not present

## 2018-06-18 DIAGNOSIS — I82409 Acute embolism and thrombosis of unspecified deep veins of unspecified lower extremity: Secondary | ICD-10-CM

## 2018-06-18 DIAGNOSIS — E46 Unspecified protein-calorie malnutrition: Secondary | ICD-10-CM

## 2018-06-18 LAB — COMPREHENSIVE METABOLIC PANEL
ALT: 51 U/L — ABNORMAL HIGH (ref 0–44)
AST: 59 U/L — ABNORMAL HIGH (ref 15–41)
Albumin: 2.2 g/dL — ABNORMAL LOW (ref 3.5–5.0)
Alkaline Phosphatase: 302 U/L — ABNORMAL HIGH (ref 38–126)
Anion gap: 10 (ref 5–15)
BUN: 13 mg/dL (ref 8–23)
CO2: 22 mmol/L (ref 22–32)
Calcium: 8.2 mg/dL — ABNORMAL LOW (ref 8.9–10.3)
Chloride: 104 mmol/L (ref 98–111)
Creatinine, Ser: 0.95 mg/dL (ref 0.44–1.00)
GFR calc Af Amer: >60 mL/min (ref >60)
GFR calc non Af Amer: >60 mL/min (ref >60)
Glucose, Bld: 84 mg/dL (ref 70–99)
Potassium: 3 mmol/L — ABNORMAL LOW (ref 3.5–5.1)
Sodium: 136 mmol/L (ref 135–145)
Total Bilirubin: 0.7 mg/dL (ref 0.3–1.2)
Total Protein: 6.7 g/dL (ref 6.5–8.1)

## 2018-06-18 LAB — MAGNESIUM: Magnesium: 1.9 mg/dL (ref 1.7–2.4)

## 2018-06-18 LAB — CBC WITH DIFFERENTIAL/PLATELET
Abs Immature Granulocytes: 0.23 10*3/uL — ABNORMAL HIGH (ref 0.00–0.07)
Basophils Absolute: 0 10*3/uL (ref 0.0–0.1)
Basophils Relative: 0 %
Eosinophils Absolute: 0 10*3/uL (ref 0.0–0.5)
Eosinophils Relative: 0 %
HCT: 25.4 % — ABNORMAL LOW (ref 36.0–46.0)
Hemoglobin: 8.3 g/dL — ABNORMAL LOW (ref 12.0–15.0)
Immature Granulocytes: 1 %
Lymphocytes Relative: 17 %
Lymphs Abs: 2.9 10*3/uL (ref 0.7–4.0)
MCH: 30.1 pg (ref 26.0–34.0)
MCHC: 32.7 g/dL (ref 30.0–36.0)
MCV: 92 fL (ref 80.0–100.0)
Monocytes Absolute: 1.3 10*3/uL — ABNORMAL HIGH (ref 0.1–1.0)
Monocytes Relative: 7 %
Neutro Abs: 13 10*3/uL — ABNORMAL HIGH (ref 1.7–7.7)
Neutrophils Relative %: 75 %
Platelets: 277 10*3/uL (ref 150–400)
RBC: 2.76 MIL/uL — ABNORMAL LOW (ref 3.87–5.11)
RDW: 19.9 % — ABNORMAL HIGH (ref 11.5–15.5)
WBC: 17.5 10*3/uL — ABNORMAL HIGH (ref 4.0–10.5)
nRBC: 0 % (ref 0.0–0.2)

## 2018-06-18 LAB — AMYLASE: Amylase: 44 U/L (ref 28–100)

## 2018-06-18 MED ORDER — OXYCODONE HCL 15 MG PO TABS: 15.0000 mg | ORAL_TABLET | ORAL | 0 refills | Status: AC | PRN

## 2018-06-18 MED ORDER — SODIUM CHLORIDE 0.9 % IV SOLN
Freq: Once | INTRAVENOUS | Status: AC
  Administered 2018-06-18: 11:00:00 via INTRAVENOUS
  Filled 2018-06-18: qty 1000

## 2018-06-18 MED ORDER — HEPARIN SOD (PORK) LOCK FLUSH 100 UNIT/ML IV SOLN
500.0000 [IU] | Freq: Once | INTRAVENOUS | Status: AC
  Administered 2018-06-18: 13:00:00 500 [IU] via INTRAVENOUS
  Filled 2018-06-18: qty 5

## 2018-06-18 MED ORDER — SODIUM CHLORIDE 0.9% FLUSH
10.0000 mL | INTRAVENOUS | Status: DC | PRN
  Administered 2018-06-18: 10 mL via INTRAVENOUS
  Filled 2018-06-18: qty 10

## 2018-06-18 MED ORDER — ALOSETRON HCL 0.5 MG PO TABS: 0.5000 mg | ORAL_TABLET | Freq: Two times a day (BID) | ORAL | 0 refills | Status: AC

## 2018-06-18 NOTE — Progress Notes (Signed)
Patient stated that she has had good days and bad days. Patient stated that there are days that she has nausea and diarrhea. Patient takes medication to help her with that. Patient is requesting a refill on her Oxycodone.

## 2018-06-18 NOTE — Telephone Encounter (Signed)
Last OV today. Potassium results 3.0 Last refill 06/06/18, #30, 0 refills.

## 2018-06-18 NOTE — Telephone Encounter (Signed)
PA submitted for patient's Bentyl tablets Cindy [place] Key: [id number] - Rx #: [id number]   Pending insurance approval.

## 2018-06-18 NOTE — Progress Notes (Signed)
[place]  [phone] [phone]   Name: Cindy Bowman Date: 06/18/2018 MRN: [id number]  DOB: 1950-09-23  Patient Care Team: Glendon Axe, MD as PCP - General (Internal Medicine) Clent Jacks, RN as Registered Nurse Lloyd Huger, MD as Medical Oncologist (Medical Oncology)    REASON FOR CONSULTATION: Palliative Care consult requested for this67 y.o.femalewith multiple medical problems including  adenocarcinoma of the pancreas (diagnosed 09/2017) s/p neoadjuvant FOLFIRINOX. Patient also found to have a L. Renal mass suspicious for RCC. Patientdeveloped necrotizing pancreatitis after fiduciary placement procedure at [facility] which was subsequently managed conservatively. She was hospitalized2/14/20 to 04/09/18 with intractable abdominal pain. CT scan revealed large pancreatic pseudocyst without evidence of further necrosis. She was  Managed conservatively.Patient was hospitalized again 05/09/18 to 05/23/18 with gastric outlet obstruction and an enlarging pancreatic pseudocyst. 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 Patient has been referred to palliative care to help with symptom management andaddress treatmentgoals.  SOCIAL HISTORY:    Patient is divorced.  She has one son with whom she lives.  Patient moved here from [place].  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  ADVANCE DIRECTIVES:  Patient says her son is her healthcare power of attorney.  Patient says she has a living will.  CODE STATUS: DNR (MOST completed on 12/01/18)  PAST MEDICAL HISTORY: Past Medical History:  Diagnosis Date  . Anemia   . Complication of anesthesia   . Difficult airway for intubation    2018 Oophorectomy, CRNA unable to visualized vocal cords with MAC 3 bladed. Easy ventilations O2 sats 99%. Rolls placed under the shoulders and pillows under the head  Repeat Laryngoscopy with MAC 3 blade > Visualization not much better. O2 sats 99%. Intubation with Glidescope on the Second Attempt. BBSE good ETCO 2 O 2 sats 99%.   . Essential hypertension   . Pancreatic cancer ([place])   . PONV (postoperative nausea and vomiting)   . UTI (urinary tract infection)     PAST SURGICAL HISTORY:  Past Surgical History:  Procedure Laterality Date  . BIOPSY  05/16/2018   Procedure: BIOPSY;  Surgeon: Rush Landmark Telford Nab., MD;  Location: [facility];  Service: Gastroenterology;;  . CESAREAN SECTION    . CHOLECYSTECTOMY    . ESOPHAGOGASTRODUODENOSCOPY (EGD) WITH PROPOFOL N/A 05/16/2018   Procedure: ESOPHAGOGASTRODUODENOSCOPY (EGD) WITH PROPOFOL;  Surgeon: Rush Landmark Telford Nab., MD;  Location: [facility];  Service: Gastroenterology;  Laterality: N/A;  . EUS N/A 10/25/2017   Procedure: FULL UPPER ENDOSCOPIC ULTRASOUND (EUS) RADIAL;  Surgeon: Jola Schmidt, MD;  Location: [facility] END[facility];  Service: Endoscopy;  Laterality: N/A;  . IR RADIOLOGIST EVAL & MGMT  05/08/2018  . LAPAROSCOPY N/A 05/09/2018   Procedure: LAPAROSCOPY DIAGNOSTIC AND J TUBE PLACEMENT (FEEDING TUBE);  Surgeon: Stark Klein, MD;  Location: [facility];  Service: General;  Laterality: N/A;  . OOPHORECTOMY    . PANCREATIC STENT PLACEMENT N/A 05/16/2018   Procedure: AXIOS STENT PLACEMENT;  Surgeon: Irving Copas., MD;  Location: [facility];  Service: Gastroenterology;  Laterality: N/A;  Hobbs Double Pigtail stent placed Balloon Dialtion of Cyst opening Cautery used to open cyst wall  . PORTA CATH INSERTION N/A 11/12/2017   Procedure: PORTA CATH INSERTION;  Surgeon: Algernon Huxley, MD;  Location: [facility];  Service: Cardiovascular;  Laterality: N/A;  . TUBAL LIGATION    . UPPER ESOPHAGEAL ENDOSCOPIC ULTRASOUND (EUS) N/A 05/16/2018  Procedure: UPPER ESOPHAGEAL ENDOSCOPIC ULTRASOUND (EUS);  Surgeon: Irving Copas., MD;  Location: [facility];  Service: Gastroenterology;   Laterality: N/A;    HEMATOLOGY/ONCOLOGY HISTORY:    Malignant neoplasm of head of pancreas ([place])   10/18/2017 Initial Diagnosis    Pancreatic adenocarcinoma ([place])    10/26/2017 Cancer Staging    Staging form: Exocrine Pancreas, AJCC 8th Edition - Clinical stage from 10/26/2017: Stage IB (cT2, cN0, cM0) - Signed by Lloyd Huger, MD on 10/26/2017    11/06/2017 - 12/11/2017 Chemotherapy    The patient had palonosetron (ALOXI) injection 0.25 mg, 0.25 mg, Intravenous,  Once, 2 of 4 cycles Administration: 0.25 mg (11/14/2017), 0.25 mg (11/28/2017) irinotecan (CAMPTOSAR) 340 mg in dextrose 5 % 500 mL chemo infusion, 180 mg/m2 = 340 mg, Intravenous,  Once, 2 of 4 cycles Dose modification: 160 mg/m2 (original dose 180 mg/m2, Cycle 2, Reason: Dose not tolerated) Administration: 340 mg (11/14/2017), 300 mg (11/28/2017) leucovorin 750 mg in dextrose 5 % 250 mL infusion, 760 mg, Intravenous,  Once, 2 of 4 cycles Dose modification: 360 mg/m2 (original dose 400 mg/m2, Cycle 2, Reason: Dose not tolerated) Administration: 750 mg (11/14/2017), 700 mg (11/28/2017) oxaliplatin (ELOXATIN) 150 mg in dextrose 5 % 500 mL chemo infusion, 160 mg, Intravenous,  Once, 2 of 4 cycles Dose modification: 70 mg/m2 (original dose 85 mg/m2, Cycle 2, Reason: Dose not tolerated) Administration: 150 mg (11/14/2017), 135 mg (11/28/2017) fluorouracil (ADRUCIL) chemo injection 750 mg, 400 mg/m2 = 750 mg, Intravenous,  Once, 2 of 4 cycles Dose modification: 360 mg/m2 (original dose 400 mg/m2, Cycle 2, Reason: Dose not tolerated) Administration: 750 mg (11/14/2017), 700 mg (11/28/2017) fluorouracil (ADRUCIL) 4,550 mg in sodium chloride 0.9 % 59 mL chemo infusion, 2,400 mg/m2 = 4,550 mg, Intravenous, 1 Day/Dose, 2 of 4 cycles Dose modification: 2,000 mg/m2 (original dose 2,400 mg/m2, Cycle 2, Reason: Dose not tolerated) Administration: 4,550 mg (11/14/2017), 3,800 mg (11/28/2017)  for chemotherapy treatment.     12/26/2017 -  Chemotherapy     The patient had gemcitabine (GEMZAR) 1,800 mg in sodium chloride 0.9 % 250 mL chemo infusion, 1,786 mg, Intravenous,  Once, 3 of 4 cycles Administration: 1,800 mg (12/26/2017), 1,800 mg (01/02/2018), 1,800 mg (01/09/2018), 1,800 mg (01/23/2018), 1,800 mg (01/30/2018)  for chemotherapy treatment.      ALLERGIES:  is allergic to nsaids; ibuprofen; and penicillins.  MEDICATIONS:  Current Outpatient Medications  Medication Sig Dispense Refill  . acetaminophen (TYLENOL) 500 MG tablet Take 1 tablet by mouth every 6 (six) hours as needed.    Marland Kitchen apixaban (ELIQUIS) 5 MG TABS tablet Take 1 tablet (5 mg total) by mouth 2 (two) times daily. 60 tablet 3  . bacitracin-polymyxin b (POLYSPORIN) ophthalmic ointment Place 2 application into both eyes 2 (two) times daily.    . clotrimazole-betamethasone (LOTRISONE) cream 1 application 2 (two) times daily.    Marland Kitchen dexamethasone (DECADRON) 4 MG tablet Take 1 tablet (4 mg total) by mouth daily. (Patient not taking: Reported on 06/18/2018) 30 tablet 1  . dicyclomine (BENTYL) 20 MG tablet Take 1 tablet (20 mg total) by mouth 3 (three) times daily before meals. (Patient not taking: Reported on 06/18/2018) 90 tablet 0  . fentaNYL (DURAGESIC) 75 MCG/HR Place 1 patch onto the skin every 3 (three) days. 5 patch 0  . folic acid (FOLVITE) 1 MG tablet TAKE 1 TABLET BY MOUTH EVERY DAY (Patient taking differently: Take 1 mg by mouth daily. ) 90 tablet 1  . gabapentin (NEURONTIN) 300 MG capsule  Take 1 capsule (300 mg total) by mouth at bedtime. 90 capsule 0  . lidocaine-prilocaine (EMLA) cream Apply 1 application topically as needed. (Patient taking differently: Apply 1 application topically as needed (port access). ) 30 g 2  . lipase/protease/amylase (CREON) 36000 UNITS CPEP capsule Take 36,000-72,000 Units by mouth See admin instructions. Take 36000 units with small meals and 72000 units with big meals    . loperamide (IMODIUM) 2 MG capsule Take 2 mg by mouth as needed for diarrhea  or loose stools.     Marland Kitchen losartan-hydrochlorothiazide (HYZAAR) 50-12.5 MG tablet Take 1 tablet by mouth 1 day or 1 dose.    Marland Kitchen NARCAN 4 MG/0.1ML LIQD nasal spray kit Place 1 spray into the nose as needed (opioid overdose).     . Nutritional Supplements (FEEDING SUPPLEMENT, VITAL 1.5 CAL,) LIQD Give vital 1.5 33m/hr using continous pump for 24 hours via J-tube. Flush tube with water 1822m4 times per day. 1200 mL 2  . ondansetron (ZOFRAN-ODT) 4 MG disintegrating tablet Take 1 tablet by mouth every 8 (eight) hours as needed.    . Marland KitchenxyCODONE (ROXICODONE) 15 MG immediate release tablet Take 1-2 tablets (15-30 mg total) by mouth every 4 (four) hours as needed for pain. 90 tablet 0  . potassium chloride (KLOR-CON SPRINKLE) 10 MEQ CR capsule Take 2 capsules (20 mEq total) by mouth daily. May open capsule to sprinkle on food. 30 capsule 0  . prochlorperazine (COMPAZINE) 5 MG tablet Take 1 tablet (5 mg total) by mouth every 6 (six) hours as needed for nausea or vomiting. 30 tablet 0  . promethazine (PHENERGAN) 12.5 MG suppository Place 1 suppository (12.5 mg total) rectally every 6 (six) hours as needed for nausea or vomiting. 12 each 0  . promethazine (PHENERGAN) 12.5 MG tablet Take 12.5 mg by mouth every 6 (six) hours as needed for nausea or vomiting.    . Marland KitcheniZANidine (ZANAFLEX) 2 MG tablet Take 1 tablet (2 mg total) by mouth every 8 (eight) hours as needed for muscle spasms. 30 tablet 0  . traMADol (ULTRAM) 50 MG tablet Take 1 tablet by mouth at bedtime.     No current facility-administered medications for this visit.    Facility-Administered Medications Ordered in Other Visits  Medication Dose Route Frequency Provider Last Rate Last Dose  . prochlorperazine (COMPAZINE) injection 10 mg  10 mg Intravenous Once RoRexene Agent    . sodium chloride flush (NS) 0.9 % injection 10 mL  10 mL Intravenous PRN FiLloyd HugerMD   10 mL at 06/18/18 0954    VITAL SIGNS: There were no vitals taken for this visit.  There were no vitals filed for this visit.  Estimated body mass index is 20.9 kg/m as calculated from the following:   Height as of 05/16/18: _0  (1.6 m).   Weight as of an earlier encounter on 06/18/18: 118 lb (53.5 kg).  LABS: CBC:    Component Value Date/Time   WBC 17.5 (H) 06/18/2018 0946   HGB 8.3 (L) 06/18/2018 0946   HCT 25.4 (L) 06/18/2018 0946   PLT 277 06/18/2018 0946   MCV 92.0 06/18/2018 0946   NEUTROABS 13.0 (H) 06/18/2018 0946   LYMPHSABS 2.9 06/18/2018 0946   MONOABS 1.3 (H) 06/18/2018 0946   EOSABS 0.0 06/18/2018 0946   BASOSABS 0.0 06/18/2018 0946   Comprehensive Metabolic Panel:    Component Value Date/Time   NA 136 06/18/2018 0946   K 3.0 (L) 06/18/2018 099381  CL 104 06/18/2018 0946   CO2 22 06/18/2018 0946   BUN 13 06/18/2018 0946   CREATININE 0.95 06/18/2018 0946   GLUCOSE 84 06/18/2018 0946   CALCIUM 8.2 (L) 06/18/2018 0946   AST 59 (H) 06/18/2018 0946   ALT 51 (H) 06/18/2018 0946   ALKPHOS 302 (H) 06/18/2018 0946   BILITOT 0.7 06/18/2018 0946   PROT 6.7 06/18/2018 0946   ALBUMIN 2.2 (L) 06/18/2018 0946    RADIOGRAPHIC STUDIES: Ct Abdomen Wo Contrast  Result Date: 05/20/2018 CLINICAL DATA:  s/p Procedure(s):LAPAROSCOPY DIAGNOSTIC AND J TUBE PLACEMENT (FEEDING TUBE) 3/19/2020Pt c/o N/V today EXAM: CT ABDOMEN WITHOUT CONTRAST TECHNIQUE: Multidetector CT imaging of the abdomen was performed following the standard protocol without IV contrast. COMPARISON:  Radiographs, 05/19/2018. Prior abdomen and pelvis CTs, most recent dated 05/10/2018. FINDINGS: Lower chest: Lower minimal effusions lobe. Dependent lung base opacity mostly in the lower lobes consistent with atelectasis. Both have improved from the prior CT. Hepatobiliary: Diffusely decreased attenuation of the liver consistent with fatty infiltration. No discrete liver mass. Status post cholecystectomy. No bile duct dilation. Pancreas: Hypoattenuating mass, uncinate process, unchanged from the most  recent prior study, less well-defined due to lack of contrast. The large pseudocyst noted along the anterior pancreatic body and tail is significantly decreased in size subsequent to placement of the double pigtail catheter extending from the stomach into the pseudocyst. Cyst now measures approximately 7.7 cm in long axis by 2.0 x 1.4 cm. Spleen: Normal in size without focal abnormality. Adrenals/Urinary Tract: No adrenal masses. Midpole left renal mass, 3.1 cm in greatest dimension, unchanged from the prior study. No new renal masses, no stones and no hydronephrosis. Visualized ureters are unremarkable. Stomach/Bowel: Double pigtail catheter extends from the mid stomach across the posterior gastric wall into the pancreatic pseudocyst, well-positioned and significantly decompressing the pseudocyst. Apparent wall thickening of the distal stomach, which may be reactive to the underlying pancreatic inflammation. Well-positioned jejunostomy tube stable from the prior CT. Mild distention proximal small bowel loops, maximum diameter 3.7 cm no defined transition point, but the more distal small bowel is decompressed. No small bowel wall thickening. Visualized colon is mostly decompressed. No wall thickening or adjacent inflammation. Vascular/Lymphatic: No aneurysm.  No discrete enlarged lymph nodes. Other: Trace amount of ascites primarily seen adjacent to the liver, increased from the previous CT scan. Musculoskeletal: No osteoblastic or osteolytic lesions. IMPRESSION: 1. Large pseudocyst noted on the prior CT along the anterior margin of the pancreatic body and tail has been significant the decompressed following placement a double pigtail catheter extending from the stomach into the pseudocyst, through the stomach posterior wall. 2. Trace amount of ascites which is increased compared to the prior CT. 3. Mild interval decrease in pleural effusions and dependent lung base atelectasis since the prior CT. 4. Mild dilation  of proximal small bowel. This may reflect an adynamic ileus or a partial obstruction. A discrete transition point is not defined, however. 5. No other changes from the recent prior CT. Pancreatic mass is less well-defined due to lack of intravenous contrast. Left renal mass is stable. Electronically Signed   By: Lajean Manes M.D.   On: 05/20/2018 16:07    PERFORMANCE STATUS (ECOG) : 3 - Symptomatic, >50% confined to bed  Review of Systems Unless otherwise noted, a complete review of systems is negative.  Physical Exam General: NAD, frail appearing, thin Pulmonary: unlabored Abdomen: J tube noted but not visualized  Skin: no rashes Neurological: Weakness but otherwise nonfocal  IMPRESSION:  Patient was seen for palliative care follow-up while receiving IV infusion.  She continues to receive minimal tube feeds.  Last tube feed was on Saturday.  States she may have received 20 to 30 mL's prior to the start of abdominal cramping and diarrhea.  She continues to be quite fatigued and weak.  She is here today for IV fluids and electrolyte replacement.  She continues to not be interested in chemotherapy . Feels if she did not have cramping or diarrhea with tube feeds she would feel much better and have more strength.   Unfortunately, Bentyl has still not been approved by her insurance.  Can try Lotronex 0.5 mg twice daily X 7 days.  Per up-to-date this medication is to be started at 0.5 mg twice daily for 4 weeks and if tolerated can be increased to 1 mg twice daily.  I have given her a 2-week supply and she will touch base with Josh Borders, palliative NP to see if improvement is noted.   I briefly touched base regarding her thoughts on hospice and she continues to decline at this time.  Case and plan discussed with Dr. Grayland Ormond who will also see patient today.  PLAN: -Best supportive care -RTC in 1-2 weeks   Patient expressed understanding and was in agreement with this plan. She also  understands that She can call clinic at any time with any questions, concerns, or complaints.   Time Total: 25 minutes  Visit consisted of counseling and education dealing with the complex and emotionally intense issues of symptom management and palliative care in the setting of serious and potentially life-threatening illness.Greater than 50%  of this time was spent counseling and coordinating care related to the above assessment and plan.  Signed by: Faythe Casa, AGNP-C

## 2018-06-19 LAB — CANCER ANTIGEN 19-9: CA 19-9: 591 U/mL — ABNORMAL HIGH (ref 0–35)

## 2018-06-24 ENCOUNTER — Telehealth: Payer: Self-pay

## 2018-06-24 NOTE — Telephone Encounter (Signed)
Nutrition  Called [name] for nutrition follow-up.  Left message for son to call RD back.  Klye Besecker B. Zenia Resides, [place], [place] Registered Dietitian [phone] (pager)

## 2018-06-25 ENCOUNTER — Other Ambulatory Visit: Payer: Self-pay

## 2018-06-25 ENCOUNTER — Inpatient Hospital Stay: Payer: Medicare Other | Attending: Oncology

## 2018-06-25 ENCOUNTER — Inpatient Hospital Stay: Payer: Medicare Other

## 2018-06-25 ENCOUNTER — Inpatient Hospital Stay (HOSPITAL_BASED_OUTPATIENT_CLINIC_OR_DEPARTMENT_OTHER): Payer: Medicare Other | Admitting: Hospice and Palliative Medicine

## 2018-06-25 DIAGNOSIS — R634 Abnormal weight loss: Secondary | ICD-10-CM | POA: Insufficient documentation

## 2018-06-25 DIAGNOSIS — Z9181 History of falling: Secondary | ICD-10-CM | POA: Insufficient documentation

## 2018-06-25 DIAGNOSIS — Z515 Encounter for palliative care: Secondary | ICD-10-CM | POA: Diagnosis present

## 2018-06-25 DIAGNOSIS — R109 Unspecified abdominal pain: Secondary | ICD-10-CM | POA: Insufficient documentation

## 2018-06-25 DIAGNOSIS — R410 Disorientation, unspecified: Secondary | ICD-10-CM

## 2018-06-25 DIAGNOSIS — G893 Neoplasm related pain (acute) (chronic): Secondary | ICD-10-CM

## 2018-06-25 DIAGNOSIS — C250 Malignant neoplasm of head of pancreas: Secondary | ICD-10-CM

## 2018-06-25 DIAGNOSIS — R531 Weakness: Secondary | ICD-10-CM

## 2018-06-25 DIAGNOSIS — C787 Secondary malignant neoplasm of liver and intrahepatic bile duct: Secondary | ICD-10-CM | POA: Insufficient documentation

## 2018-06-25 DIAGNOSIS — R740 Nonspecific elevation of levels of transaminase and lactic acid dehydrogenase [LDH]: Secondary | ICD-10-CM

## 2018-06-25 DIAGNOSIS — C259 Malignant neoplasm of pancreas, unspecified: Secondary | ICD-10-CM

## 2018-06-25 DIAGNOSIS — R978 Other abnormal tumor markers: Secondary | ICD-10-CM

## 2018-06-25 DIAGNOSIS — Z95828 Presence of other vascular implants and grafts: Secondary | ICD-10-CM

## 2018-06-25 LAB — COMPREHENSIVE METABOLIC PANEL
ALT: 58 U/L — ABNORMAL HIGH (ref 0–44)
AST: 74 U/L — ABNORMAL HIGH (ref 15–41)
Albumin: 2.4 g/dL — ABNORMAL LOW (ref 3.5–5.0)
Alkaline Phosphatase: 513 U/L — ABNORMAL HIGH (ref 38–126)
Anion gap: 8 (ref 5–15)
BUN: 15 mg/dL (ref 8–23)
CO2: 23 mmol/L (ref 22–32)
Calcium: 8.1 mg/dL — ABNORMAL LOW (ref 8.9–10.3)
Chloride: 109 mmol/L (ref 98–111)
Creatinine, Ser: 0.72 mg/dL (ref 0.44–1.00)
GFR calc Af Amer: >60 mL/min (ref >60)
GFR calc non Af Amer: >60 mL/min (ref >60)
Glucose, Bld: 112 mg/dL — ABNORMAL HIGH (ref 70–99)
Potassium: 3.1 mmol/L — ABNORMAL LOW (ref 3.5–5.1)
Sodium: 140 mmol/L (ref 135–145)
Total Bilirubin: 0.7 mg/dL (ref 0.3–1.2)
Total Protein: 6.5 g/dL (ref 6.5–8.1)

## 2018-06-25 LAB — CBC WITH DIFFERENTIAL/PLATELET
Abs Immature Granulocytes: 0.13 10*3/uL — ABNORMAL HIGH (ref 0.00–0.07)
Basophils Absolute: 0 10*3/uL (ref 0.0–0.1)
Basophils Relative: 0 %
Eosinophils Absolute: 0 10*3/uL (ref 0.0–0.5)
Eosinophils Relative: 0 %
HCT: 27.9 % — ABNORMAL LOW (ref 36.0–46.0)
Hemoglobin: 8.9 g/dL — ABNORMAL LOW (ref 12.0–15.0)
Immature Granulocytes: 1 %
Lymphocytes Relative: 11 %
Lymphs Abs: 1.8 10*3/uL (ref 0.7–4.0)
MCH: 30 pg (ref 26.0–34.0)
MCHC: 31.9 g/dL (ref 30.0–36.0)
MCV: 93.9 fL (ref 80.0–100.0)
Monocytes Absolute: 1.2 10*3/uL — ABNORMAL HIGH (ref 0.1–1.0)
Monocytes Relative: 7 %
Neutro Abs: 13.2 10*3/uL — ABNORMAL HIGH (ref 1.7–7.7)
Neutrophils Relative %: 81 %
Platelets: 190 10*3/uL (ref 150–400)
RBC: 2.97 MIL/uL — ABNORMAL LOW (ref 3.87–5.11)
RDW: 21.8 % — ABNORMAL HIGH (ref 11.5–15.5)
WBC: 16.3 10*3/uL — ABNORMAL HIGH (ref 4.0–10.5)
nRBC: 0 % (ref 0.0–0.2)

## 2018-06-25 LAB — MAGNESIUM: Magnesium: 1.8 mg/dL (ref 1.7–2.4)

## 2018-06-25 LAB — AMYLASE: Amylase: 48 U/L (ref 28–100)

## 2018-06-25 MED ORDER — SODIUM CHLORIDE 0.9% FLUSH
10.0000 mL | Freq: Once | INTRAVENOUS | Status: AC
  Administered 2018-06-25: 14:00:00 10 mL via INTRAVENOUS
  Filled 2018-06-25: qty 10

## 2018-06-25 MED ORDER — HEPARIN SOD (PORK) LOCK FLUSH 100 UNIT/ML IV SOLN
500.0000 [IU] | Freq: Once | INTRAVENOUS | Status: AC
  Administered 2018-06-25: 500 [IU] via INTRAVENOUS
  Filled 2018-06-25: qty 5

## 2018-06-25 MED ORDER — SODIUM CHLORIDE 0.9 % IV SOLN
Freq: Once | INTRAVENOUS | Status: AC
  Administered 2018-06-25: 15:00:00 via INTRAVENOUS
  Filled 2018-06-25: qty 250

## 2018-06-25 NOTE — Progress Notes (Signed)
RN made MD aware of pt.'s labs this afternoon. Verbal order for pt to receive 1L of NS over a hour today. Pt updated and all questions answered.   Cindy Bowman [employer]

## 2018-06-25 NOTE — Progress Notes (Signed)
[place]  [phone] [phone]   Name: Cindy Bowman Date: 06/25/2018 MRN: [id number]  DOB: 06-01-1950  Patient Care Team: Glendon Axe, MD as PCP - General (Internal Medicine) Clent Jacks, RN as Registered Nurse Lloyd Huger, MD as Medical Oncologist (Medical Oncology)    REASON FOR CONSULTATION: Palliative Care consult requested for this67 y.o.femalewith multiple medical problems including  adenocarcinoma of the pancreas (diagnosed 09/2017) s/p neoadjuvant FOLFIRINOX. Patient also found to have a L. Renal mass suspicious for RCC. Patientdeveloped necrotizing pancreatitis after fiduciary placement procedure at [facility] which was subsequently managed conservatively. She was hospitalized2/14/20 to 04/09/18 with intractable abdominal pain. CT scan revealed large pancreatic pseudocyst without evidence of further necrosis. She was  Managed conservatively.Patient was hospitalized again 05/09/18 to 05/23/18 with gastric outlet obstruction and an enlarging pancreatic pseudocyst. 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 Patient has been referred to palliative care to help with symptom management andaddress treatmentgoals.  SOCIAL HISTORY:    Patient is divorced.  She has one son with whom she lives.  Patient moved here from [place].  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  ADVANCE DIRECTIVES:  Patient says her son is her healthcare power of attorney.  Patient says she has a living will.  CODE STATUS: DNR (MOST completed on 12/01/18)  PAST MEDICAL HISTORY: Past Medical History:  Diagnosis Date  . Abdominal cramping   . Anemia   . Complication of anesthesia   . Difficult airway for intubation    2018 Oophorectomy, CRNA unable to visualized vocal cords with MAC 3 bladed. Easy ventilations O2 sats 99%. Rolls placed under the shoulders and  pillows under the head Repeat Laryngoscopy with MAC 3 blade > Visualization not much better. O2 sats 99%. Intubation with Glidescope on the Second Attempt. BBSE good ETCO 2 O 2 sats 99%.   . Essential hypertension   . Pancreatic cancer ([name])   . PONV (postoperative nausea and vomiting)   . UTI (urinary tract infection)     PAST SURGICAL HISTORY:  Past Surgical History:  Procedure Laterality Date  . BIOPSY  05/16/2018   Procedure: BIOPSY;  Surgeon: Rush Landmark Telford Nab., MD;  Location: [facility];  Service: Gastroenterology;;  . CESAREAN SECTION    . CHOLECYSTECTOMY    . ESOPHAGOGASTRODUODENOSCOPY (EGD) WITH PROPOFOL N/A 05/16/2018   Procedure: ESOPHAGOGASTRODUODENOSCOPY (EGD) WITH PROPOFOL;  Surgeon: Rush Landmark Telford Nab., MD;  Location: [facility];  Service: Gastroenterology;  Laterality: N/A;  . EUS N/A 10/25/2017   Procedure: FULL UPPER ENDOSCOPIC ULTRASOUND (EUS) RADIAL;  Surgeon: Jola Schmidt, MD;  Location: [facility] END[facility]COPY;  Service: Endoscopy;  Laterality: N/A;  . IR RADIOLOGIST EVAL & MGMT  05/08/2018  . LAPAROSCOPY N/A 05/09/2018   Procedure: LAPAROSCOPY DIAGNOSTIC AND J TUBE PLACEMENT (FEEDING TUBE);  Surgeon: Stark Klein, MD;  Location: [facility];  Service: General;  Laterality: N/A;  . OOPHORECTOMY    . PANCREATIC STENT PLACEMENT N/A 05/16/2018   Procedure: AXIOS STENT PLACEMENT;  Surgeon: Irving Copas., MD;  Location: [facility];  Service: Gastroenterology;  Laterality: N/A;  Hobbs Double Pigtail stent placed Balloon Dialtion of Cyst opening Cautery used to open cyst wall  . PORTA CATH INSERTION N/A 11/12/2017   Procedure: PORTA CATH INSERTION;  Surgeon: Algernon Huxley, MD;  Location: [facility];  Service: Cardiovascular;  Laterality: N/A;  . TUBAL LIGATION    . UPPER ESOPHAGEAL ENDOSCOPIC ULTRASOUND (  EUS) N/A 05/16/2018   Procedure: UPPER ESOPHAGEAL ENDOSCOPIC ULTRASOUND (EUS);  Surgeon: Irving Copas., MD;  Location: [facility];  Service:  Gastroenterology;  Laterality: N/A;    HEMATOLOGY/ONCOLOGY HISTORY:    Malignant neoplasm of head of pancreas ([name])   10/18/2017 Initial Diagnosis    Pancreatic adenocarcinoma ([place])    10/26/2017 Cancer Staging    Staging form: Exocrine Pancreas, AJCC 8th Edition - Clinical stage from 10/26/2017: Stage IB (cT2, cN0, cM0) - Signed by Lloyd Huger, MD on 10/26/2017    11/06/2017 - 12/11/2017 Chemotherapy    The patient had palonosetron (ALOXI) injection 0.25 mg, 0.25 mg, Intravenous,  Once, 2 of 4 cycles Administration: 0.25 mg (11/14/2017), 0.25 mg (11/28/2017) irinotecan (CAMPTOSAR) 340 mg in dextrose 5 % 500 mL chemo infusion, 180 mg/m2 = 340 mg, Intravenous,  Once, 2 of 4 cycles Dose modification: 160 mg/m2 (original dose 180 mg/m2, Cycle 2, Reason: Dose not tolerated) Administration: 340 mg (11/14/2017), 300 mg (11/28/2017) leucovorin 750 mg in dextrose 5 % 250 mL infusion, 760 mg, Intravenous,  Once, 2 of 4 cycles Dose modification: 360 mg/m2 (original dose 400 mg/m2, Cycle 2, Reason: Dose not tolerated) Administration: 750 mg (11/14/2017), 700 mg (11/28/2017) oxaliplatin (ELOXATIN) 150 mg in dextrose 5 % 500 mL chemo infusion, 160 mg, Intravenous,  Once, 2 of 4 cycles Dose modification: 70 mg/m2 (original dose 85 mg/m2, Cycle 2, Reason: Dose not tolerated) Administration: 150 mg (11/14/2017), 135 mg (11/28/2017) fluorouracil (ADRUCIL) chemo injection 750 mg, 400 mg/m2 = 750 mg, Intravenous,  Once, 2 of 4 cycles Dose modification: 360 mg/m2 (original dose 400 mg/m2, Cycle 2, Reason: Dose not tolerated) Administration: 750 mg (11/14/2017), 700 mg (11/28/2017) fluorouracil (ADRUCIL) 4,550 mg in sodium chloride 0.9 % 59 mL chemo infusion, 2,400 mg/m2 = 4,550 mg, Intravenous, 1 Day/Dose, 2 of 4 cycles Dose modification: 2,000 mg/m2 (original dose 2,400 mg/m2, Cycle 2, Reason: Dose not tolerated) Administration: 4,550 mg (11/14/2017), 3,800 mg (11/28/2017)  for chemotherapy treatment.      12/26/2017 -  Chemotherapy    The patient had gemcitabine (GEMZAR) 1,800 mg in sodium chloride 0.9 % 250 mL chemo infusion, 1,786 mg, Intravenous,  Once, 3 of 4 cycles Administration: 1,800 mg (12/26/2017), 1,800 mg (01/02/2018), 1,800 mg (01/09/2018), 1,800 mg (01/23/2018), 1,800 mg (01/30/2018)  for chemotherapy treatment.      ALLERGIES:  is allergic to nsaids; ibuprofen; and penicillins.  MEDICATIONS:  Current Outpatient Medications  Medication Sig Dispense Refill  . acetaminophen (TYLENOL) 500 MG tablet Take 1 tablet by mouth every 6 (six) hours as needed.    Marland Kitchen alosetron (LOTRONEX) 0.5 MG tablet Take 1 tablet (0.5 mg total) by mouth 2 (two) times daily. 14 tablet 0  . apixaban (ELIQUIS) 5 MG TABS tablet Take 1 tablet (5 mg total) by mouth 2 (two) times daily. 60 tablet 3  . bacitracin-polymyxin b (POLYSPORIN) ophthalmic ointment Place 2 application into both eyes 2 (two) times daily.    . clotrimazole-betamethasone (LOTRISONE) cream 1 application 2 (two) times daily.    Marland Kitchen dexamethasone (DECADRON) 4 MG tablet Take 1 tablet (4 mg total) by mouth daily. (Patient not taking: Reported on 06/18/2018) 30 tablet 1  . dicyclomine (BENTYL) 20 MG tablet Take 1 tablet (20 mg total) by mouth 3 (three) times daily before meals. (Patient not taking: Reported on 06/18/2018) 90 tablet 0  . fentaNYL (DURAGESIC) 75 MCG/HR Place 1 patch onto the skin every 3 (three) days. 5 patch 0  . folic acid (FOLVITE) 1 MG tablet  TAKE 1 TABLET BY MOUTH EVERY DAY (Patient taking differently: Take 1 mg by mouth daily. ) 90 tablet 1  . gabapentin (NEURONTIN) 300 MG capsule Take 1 capsule (300 mg total) by mouth at bedtime. 90 capsule 0  . lidocaine-prilocaine (EMLA) cream Apply 1 application topically as needed. (Patient taking differently: Apply 1 application topically as needed (port access). ) 30 g 2  . lipase/protease/amylase (CREON) 36000 UNITS CPEP capsule Take 36,000-72,000 Units by mouth See admin instructions. Take  36000 units with small meals and 72000 units with big meals    . loperamide (IMODIUM) 2 MG capsule Take 2 mg by mouth as needed for diarrhea or loose stools.     Marland Kitchen losartan-hydrochlorothiazide (HYZAAR) 50-12.5 MG tablet Take 1 tablet by mouth 1 day or 1 dose.    Marland Kitchen NARCAN 4 MG/0.1ML LIQD nasal spray kit Place 1 spray into the nose as needed (opioid overdose).     . Nutritional Supplements (FEEDING SUPPLEMENT, VITAL 1.5 CAL,) LIQD Give vital 1.5 31m/hr using continous pump for 24 hours via J-tube. Flush tube with water 1876m4 times per day. 1200 mL 2  . ondansetron (ZOFRAN-ODT) 4 MG disintegrating tablet Take 1 tablet by mouth every 8 (eight) hours as needed.    . Marland KitchenxyCODONE (ROXICODONE) 15 MG immediate release tablet Take 1-2 tablets (15-30 mg total) by mouth every 4 (four) hours as needed for pain. 90 tablet 0  . potassium chloride (MICRO-K) 10 MEQ CR capsule TAKE 2 CAPSULES BY MOUTH DAILY. MAY OPEN CAPSULE TO SPRINKLE ON FOOD. 60 capsule 2  . prochlorperazine (COMPAZINE) 5 MG tablet Take 1 tablet (5 mg total) by mouth every 6 (six) hours as needed for nausea or vomiting. 30 tablet 0  . promethazine (PHENERGAN) 12.5 MG suppository Place 1 suppository (12.5 mg total) rectally every 6 (six) hours as needed for nausea or vomiting. 12 each 0  . promethazine (PHENERGAN) 12.5 MG tablet Take 12.5 mg by mouth every 6 (six) hours as needed for nausea or vomiting.    . Marland KitcheniZANidine (ZANAFLEX) 2 MG tablet Take 1 tablet (2 mg total) by mouth every 8 (eight) hours as needed for muscle spasms. 30 tablet 0  . traMADol (ULTRAM) 50 MG tablet Take 1 tablet by mouth at bedtime.     No current facility-administered medications for this visit.    Facility-Administered Medications Ordered in Other Visits  Medication Dose Route Frequency Provider Last Rate Last Dose  . prochlorperazine (COMPAZINE) injection 10 mg  10 mg Intravenous Once RoRexene Agent      VITAL SIGNS: There were no vitals taken for this visit. There  were no vitals filed for this visit.  Estimated body mass index is 20.9 kg/m as calculated from the following:   Height as of 05/16/18: _0  (1.6 m).   Weight as of 06/18/18: 118 lb (53.5 kg).  LABS: CBC:    Component Value Date/Time   WBC 17.5 (H) 06/18/2018 0946   HGB 8.3 (L) 06/18/2018 0946   HCT 25.4 (L) 06/18/2018 0946   PLT 277 06/18/2018 0946   MCV 92.0 06/18/2018 0946   NEUTROABS 13.0 (H) 06/18/2018 0946   LYMPHSABS 2.9 06/18/2018 0946   MONOABS 1.3 (H) 06/18/2018 0946   EOSABS 0.0 06/18/2018 0946   BASOSABS 0.0 06/18/2018 0946   Comprehensive Metabolic Panel:    Component Value Date/Time   NA 136 06/18/2018 0946   K 3.0 (L) 06/18/2018 0946   CL 104 06/18/2018 0946   CO2 22  06/18/2018 0946   BUN 13 06/18/2018 0946   CREATININE 0.95 06/18/2018 0946   GLUCOSE 84 06/18/2018 0946   CALCIUM 8.2 (L) 06/18/2018 0946   AST 59 (H) 06/18/2018 0946   ALT 51 (H) 06/18/2018 0946   ALKPHOS 302 (H) 06/18/2018 0946   BILITOT 0.7 06/18/2018 0946   PROT 6.7 06/18/2018 0946   ALBUMIN 2.2 (L) 06/18/2018 0946    RADIOGRAPHIC STUDIES: No results found.  PERFORMANCE STATUS (ECOG) : 3 - Symptomatic, >50% confined to bed  Review of Systems Unless otherwise noted, a complete review of systems is negative.  Physical Exam General: NAD, frail appearing, thin Pulmonary: unlabored Abdomen: J tube noted but not visualized  Skin: no rashes, no edema Neurological: Weakness but otherwise nonfocal  IMPRESSION: Patient was seen for routine follow-up. She was seen in the infusion area.   Patient denies any current acute or distressing symptoms.  She is noticeably thinner than when last seen. Weight continues to decline. Patient says she has been tolerating her tube feeds better but nightly rate is clearly still not meeting her nutritional needs.  Patient says that the abdominal pain has improved on Lotronex and weak can continue that as needed.  I called and spoke briefly with patient's  son before he had to take another call.  He has noted some confusion and says that patient fell last week but did not sustain injury.  Overall, patient is clearly declining.  Labs reviewed.  Increasing transaminases and CA 19-9 consistent with expected disease progression.  She is scheduled tomorrow for an abdominal CT to confirm. It continues to be our recommendation that patient pursue hospice and best supportive care in the home.  Case and plan discussed with Dr. Grayland Ormond  PLAN: -Best supportive care -RTC in 1-2 weeks    Patient expressed understanding and was in agreement with this plan. She also understands that She can call clinic at any time with any questions, concerns, or complaints.     Time Total: 20 minutes  Visit consisted of counseling and education dealing with the complex and emotionally intense issues of symptom management and palliative care in the setting of serious and potentially life-threatening illness.Greater than 50%  of this time was spent counseling and coordinating care related to the above assessment and plan.  Signed by: Altha Harm, PhD, NP-C [phone] (Work Cell)

## 2018-06-26 ENCOUNTER — Telehealth: Payer: Self-pay

## 2018-06-26 ENCOUNTER — Inpatient Hospital Stay: Admission: RE | Admit: 2018-06-26 | Payer: Medicare Other | Source: Ambulatory Visit

## 2018-06-26 ENCOUNTER — Telehealth: Payer: Self-pay | Admitting: Hospice and Palliative Medicine

## 2018-06-26 LAB — CANCER ANTIGEN 19-9: CA 19-9: 1951 U/mL — ABNORMAL HIGH (ref 0–35)

## 2018-06-26 NOTE — Telephone Encounter (Signed)
Nutrition  Received voicemail from son, [name] at 1pm this afternoon.    Palliative NP notes reviewed.  Noted hospice referral in the home initiated.    Called son back at 2:21pm and left message.   Franca Stakes B. Zenia Resides, [place], [place] Registered Dietitian [phone] (pager)

## 2018-06-26 NOTE — Telephone Encounter (Signed)
Nutrition  RD working remotely.  Received call back from son, [name].    Son reports that over the weekend patient seemed more alert and eating a little bit more (still toddler size portion).  On Monday son reports that patient was willing to take tube feeding of vital (not vivonex) at 48ml/hr for 6-7 hours and had less diarrhea more mushy stool and less abdominal pain. Patient did not want to try another tube feeding formula.  Son feels that medication (lotronex) that was prescribed did help with abdominal pain.  Patient did not want to continue tube feeding during the night.  Son reports Tuesday and today more confusion, not eating or drinking.  Son reports that he has been in contact with Merrily Pew, Palliative NP.  Notes reviewed. Noted CT of abdomen cancelled for today.    Son reported that hospice will be coming on Friday.  Intervention: Encouraged son not to give anything by mouth unless that patient is alert and able to safely take po foods and liquids.   Patient continues to not meet nutritional needs orally or via feeding tube. Focus is comfort at this time.  Son has RD contact information and can call with questions or concerns.  Denisia Harpole B. Zenia Resides, [place], [place] Registered Dietitian [phone] (pager)

## 2018-06-26 NOTE — Telephone Encounter (Signed)
I called and spoke with patient's son.  He reports continued decline.  Patient no longer interested in eating or drinking.  Tube feed rates, while improved, are not likely meeting her nutritional needs.  Patient has had progressive weakness including the fall last week.  It is becoming more difficult for him to transfer patient back and forth to the cancer center.  I reviewed with him the labs from yesterday and we discussed the probability of disease progression.  Patient was scheduled for a CT scan today to confirm that but son appropriately asked for it to be canceled as transporting her to [facility] would be too taxing.  We again discussed the option of hospice in detail.  Son asked that we send an order to initiate hospice care in the home.  Case and plan discussed with Dr. Grayland Ormond.  Plan: -Hospice referral for in-home care -Cancel CT scan -RTC as needed

## 2018-06-27 ENCOUNTER — Other Ambulatory Visit: Payer: Self-pay

## 2018-06-28 ENCOUNTER — Telehealth: Payer: Self-pay

## 2018-06-28 NOTE — Telephone Encounter (Signed)
Appt has been cancelled per Dr Rush Landmark

## 2018-06-28 NOTE — Telephone Encounter (Signed)
-----   Message from Irving Copas., MD sent at 06/28/2018  9:21 AM EDT ----- Regarding: Follow up Dear Octavia Bruckner and Severiano Gilbert hope you guys are doing well.  I was just reviewing the chart from [place] to start and it looks like she is moving towards home with hospice.She never came in for the CT scan to evaluate for the stent that we placed to hopefully be able to remove it.I just wanted to check in with you all as without a repeat CT scan and looks like you may be moving towards hospice just prior to make a decision about what is the safest thing for her in regards to the maintenance of the AXIOS cyst gastrostomy stent.I am going to go ahead and cancel her procedure for next week as I would not remove it without having already knowing what the CT scan showed. Based on the FDA guidelines would try to minimize keeping that stent in the longer than 8 to 12 weeks due to the increased risk of bleeding that can occur with the stent staying in position.If we feels that as she transitions to hospice that she likely will not survive past the next month to month and a half then maintaining the stent without putting her through any other procedures is likely the most reasonable and we would just want to make sure that the patient and son understand the risk of maintaining the stent. Please let me know what you all were thinking about so I can just make a decision tree together with you guys about the stent itself.Thanks so much for the care to provide her and for allowing me to be a part of her team.Knut Rondinelli, go ahead and please cancel next week's procedure.Thank you.Chester Holstein

## 2018-07-01 ENCOUNTER — Other Ambulatory Visit: Payer: Self-pay

## 2018-07-02 ENCOUNTER — Inpatient Hospital Stay: Payer: Medicare Other | Admitting: Oncology

## 2018-07-02 ENCOUNTER — Encounter (HOSPITAL_COMMUNITY): Admission: RE | Payer: Self-pay | Source: Home / Self Care

## 2018-07-02 ENCOUNTER — Ambulatory Visit (HOSPITAL_COMMUNITY): Admission: RE | Admit: 2018-07-02 | Payer: Medicare Other | Source: Home / Self Care | Admitting: Gastroenterology

## 2018-07-02 ENCOUNTER — Inpatient Hospital Stay: Payer: Medicare Other

## 2018-07-02 SURGERY — ESOPHAGOGASTRODUODENOSCOPY (EGD) WITH PROPOFOL
Anesthesia: Monitor Anesthesia Care

## 2018-07-08 ENCOUNTER — Other Ambulatory Visit: Payer: Self-pay | Admitting: Oncology

## 2018-08-21 DEATH — deceased

## 2018-09-23 LAB — BPAM RBC
Blood Product Expiration Date: 202004132359
Blood Product Expiration Date: 202004132359
ISSUE DATE / TIME: 202003260410
Unit Type and Rh: 5100
Unit Type and Rh: 5100

## 2018-09-23 LAB — TYPE AND SCREEN
ABO/RH(D): O POS
Antibody Screen: NEGATIVE
Unit division: 0

## 2019-02-24 IMAGING — CT CT ABD-PELV W/ CM
2 of 5 series · 16 of 46 positions shown, 18 images · IV contrast (iopamidol)
Comparison: 12/12/2017 CT and prior studies

CLINICAL DATA: 67-year-old female for follow-up/restaging of
pancreatic malignancy. Currently undergoing chemotherapy.

EXAM:
CT ABDOMEN AND PELVIS WITH CONTRAST
TECHNIQUE: Multidetector CT imaging of the abdomen and pelvis was performed
using the standard protocol following bolus administration of
intravenous contrast.
CONTRAST:  100mL LUIH5Q-EPP IOPAMIDOL (LUIH5Q-EPP) INJECTION 61%

[Series 2: abd pelvis · axial · 0.72mm/px · z∈[-1416,-1056]mm · 13 of 82 slices shown, 15 images (1 of 2)]
[im 5/82  soft-tissue]
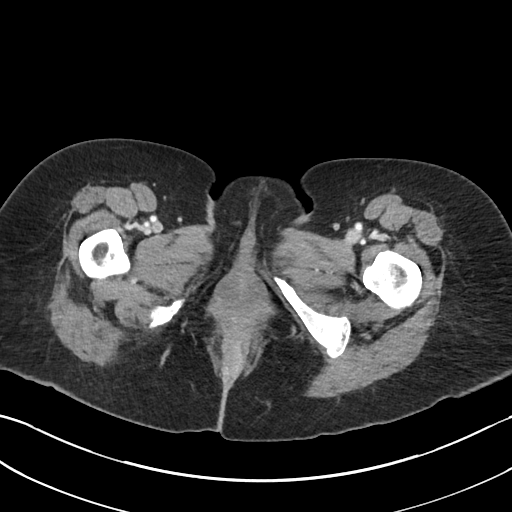
[im 5/82  bone]
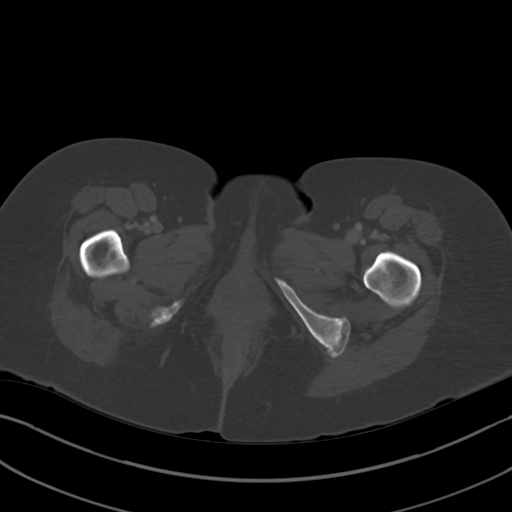
[im 10/82  soft-tissue]
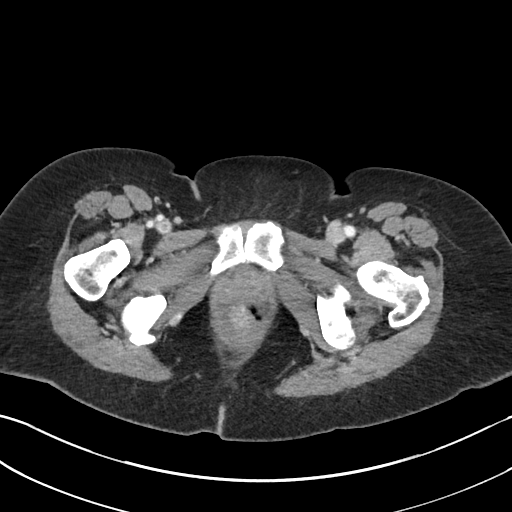
[im 19/82  soft-tissue]
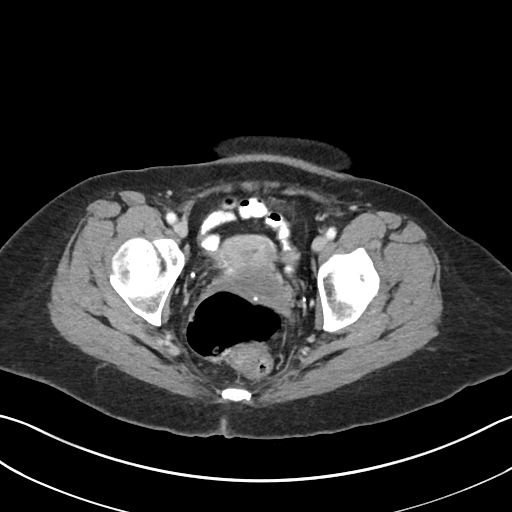
[im 23/82  soft-tissue]
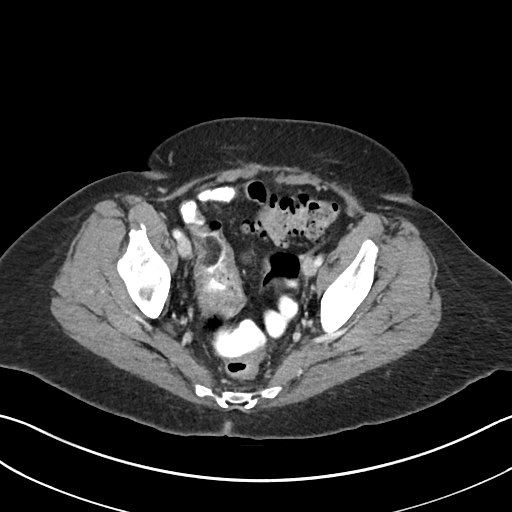
[im 28/82  soft-tissue]
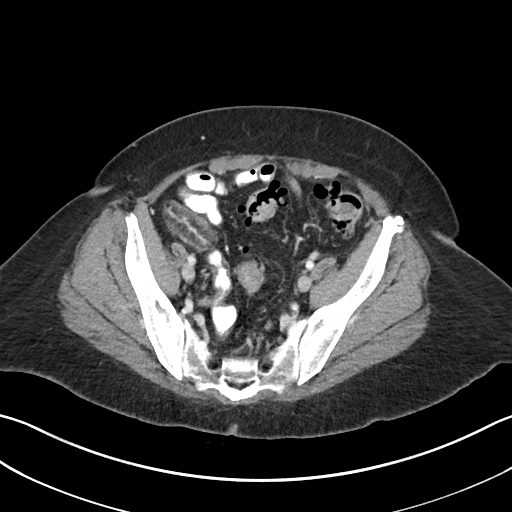
[im 37/82  soft-tissue]
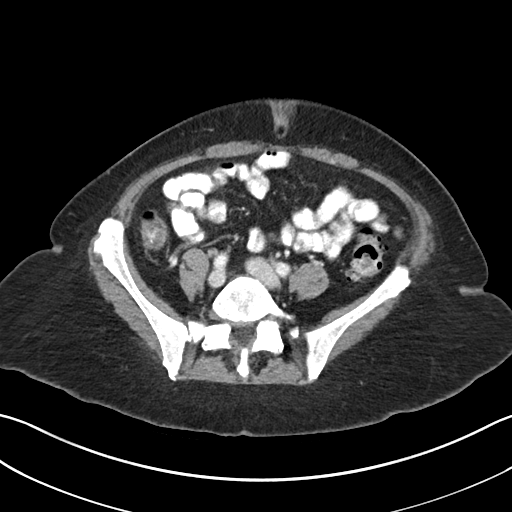
[im 41/82  soft-tissue]
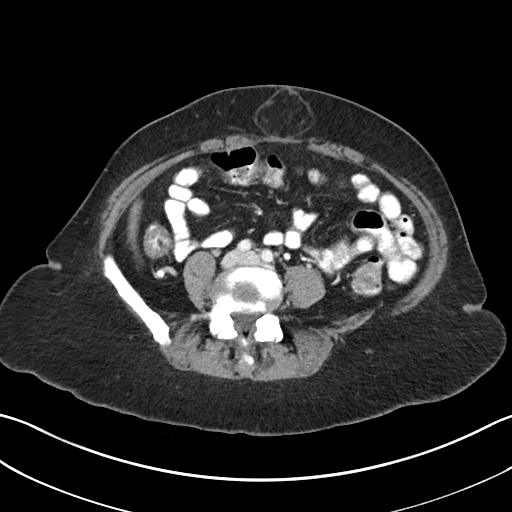
[im 46/82  soft-tissue]
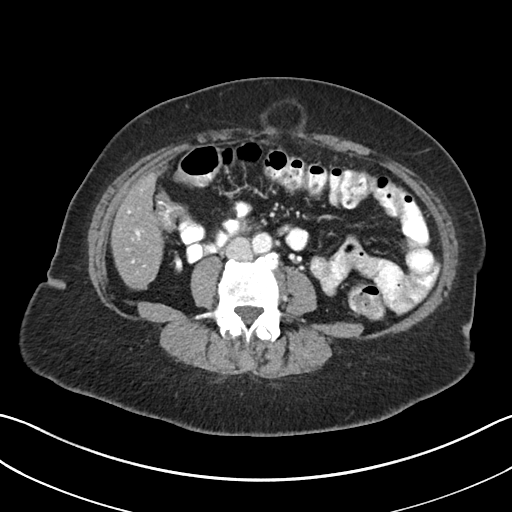
[im 55/82  soft-tissue]
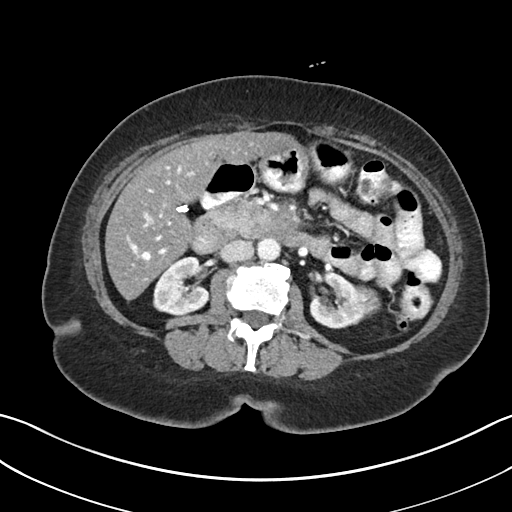
[im 55/82  bone]
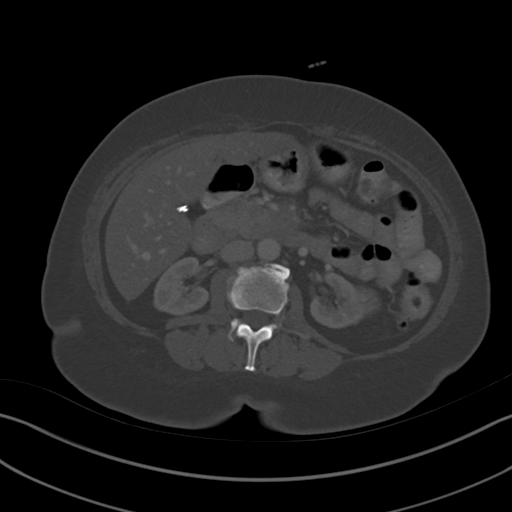
[im 59/82  soft-tissue]
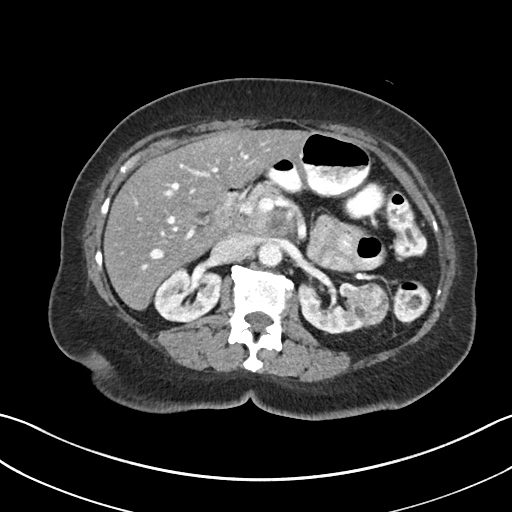
[im 64/82  soft-tissue]
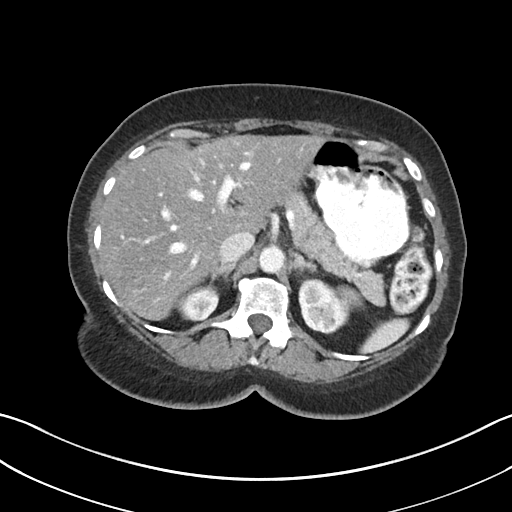
[im 73/82  soft-tissue]
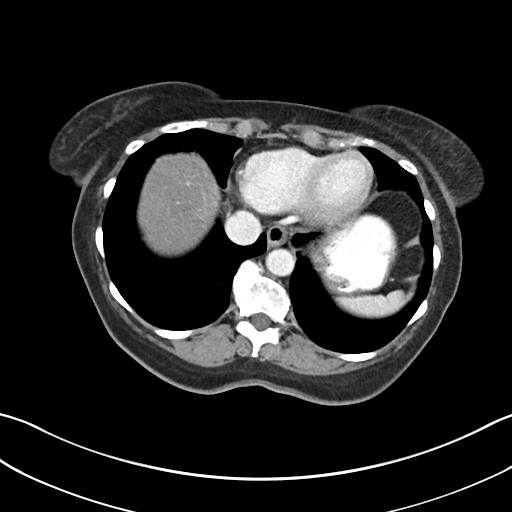
[im 77/82  soft-tissue]
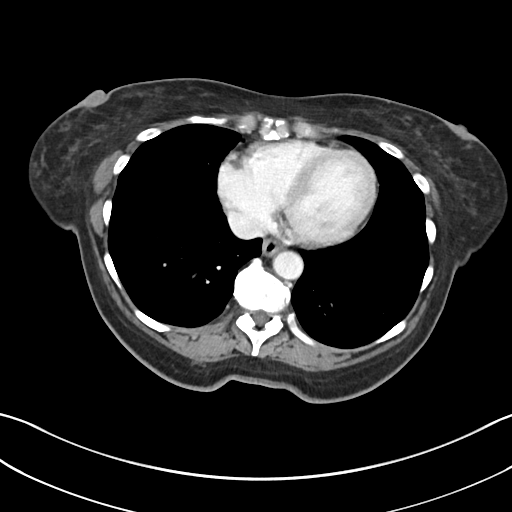

[Series 4: abd pelvis · coronal · 0.72mm/px · 3 of 133 slices shown (2 of 2)]
[im 45/133  soft-tissue]
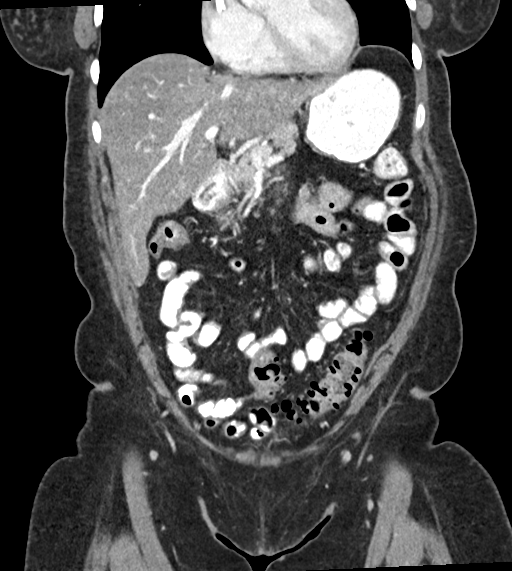
[im 59/133  soft-tissue]
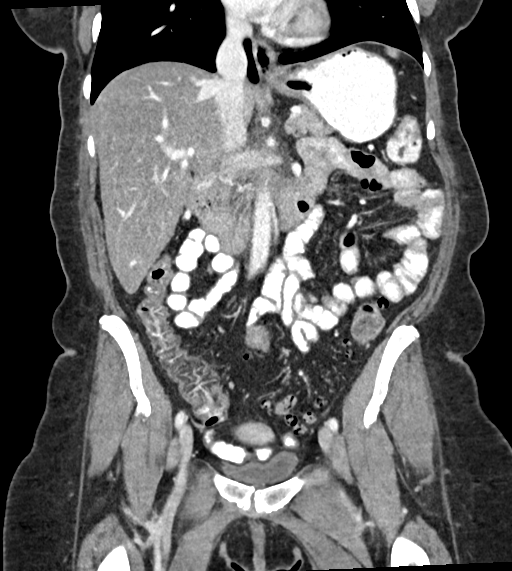
[im 74/133  soft-tissue]
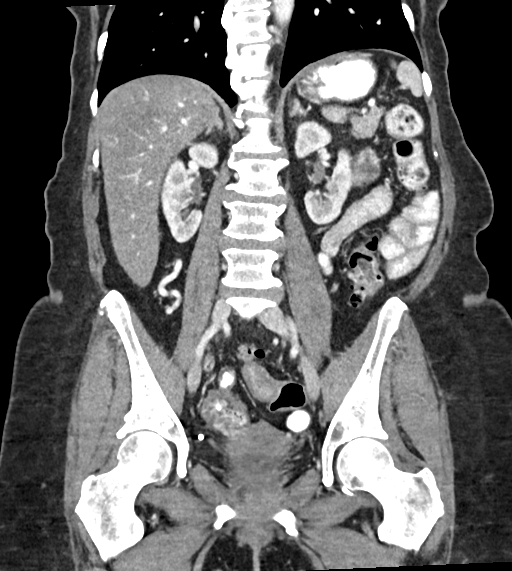

[16 of 46 positions shown; findings below may reference images not displayed]

FINDINGS: Lower chest: No acute or new abnormalities.

Hepatobiliary: Hepatic steatosis again noted. No focal hepatic
lesions are present. Patient is status post cholecystectomy. No
biliary dilatation.

Pancreas: Pancreatic uncinate process mass is unchanged in size
measuring 2.5 x 2.7 cm (series 2: Image 26). Contact with the SMA
and SMV again noted. The remainder of the pancreas is unremarkable.

Spleen: Unremarkable

Adrenals/Urinary Tract: A 3 cm enhancing solid and cystic mass
involving the LATERAL LEFT mid kidney is unchanged. There is no
evidence of hydronephrosis or new renal abnormality. The adrenal
glands are unremarkable. Bladder unremarkable with known bladder
diverticulum difficult to visualized on this exam.

Stomach/Bowel: No bowel wall thickening, bowel obstruction or
inflammatory changes. Colonic diverticulosis noted without evidence
of diverticulitis.

Vascular/Lymphatic: No significant vascular findings are present. No
enlarged abdominal or pelvic lymph nodes.

Reproductive: Uterus and bilateral adnexa are unremarkable.

Other: No ascites or peritoneal/omental abnormality. A moderate
supraumbilical ventral hernia containing fat is unchanged. No
pneumoperitoneum or focal collection/abscess.

Musculoskeletal: No acute or suspicious bony abnormalities.
IMPRESSION: 1. No significant change in 2.7 cm pancreatic uncinate process mass
since 12/12/2017. No evidence of distant metastases.
2. Unchanged 3 cm LEFT renal mass highly suspicious for renal cell
carcinoma.
3. No new or significant changes from the prior study.

## 2019-05-26 IMAGING — DX ABDOMEN - 1 VIEW
2 series · 2 of 2 positions shown · non-contrast
Comparison: May 11, 2018

CLINICAL DATA: Nausea.  Cystogastrostomy stent placement

EXAM:
ABDOMEN - 1 VIEW

[abdomen kub (1 of 2)]
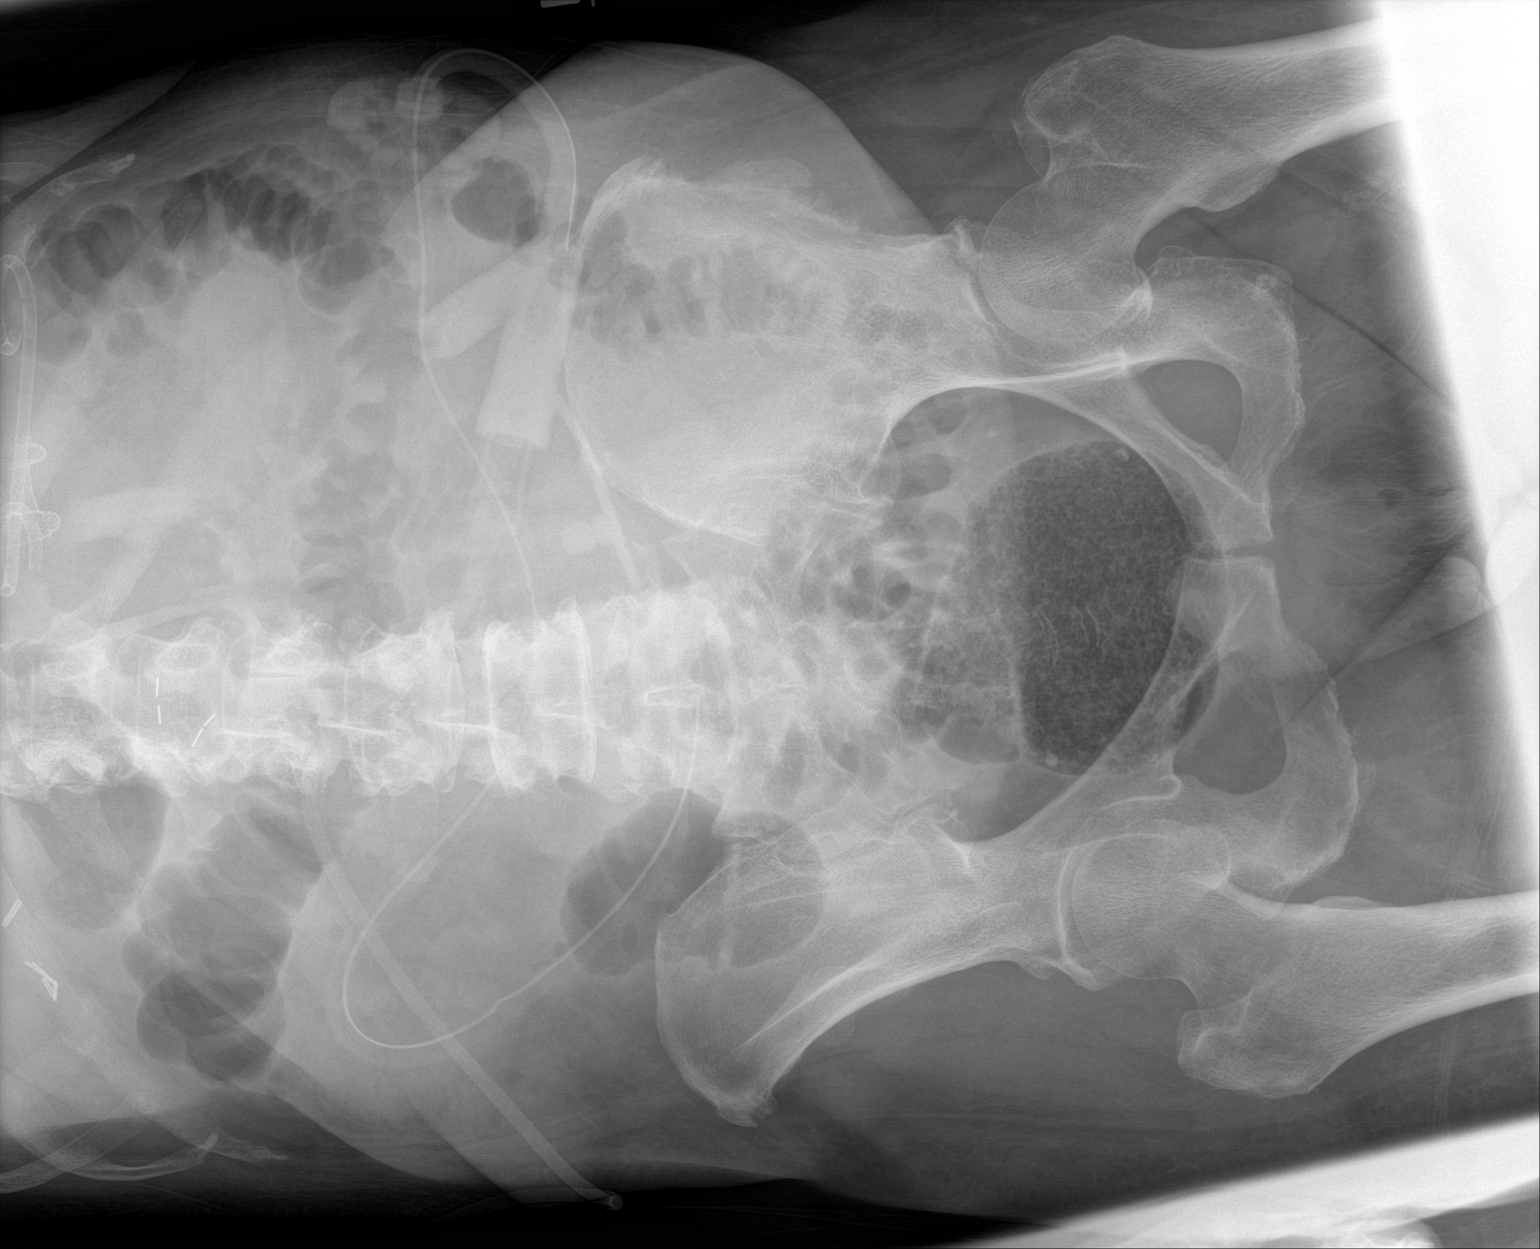

[abdomen kub (2 of 2)]
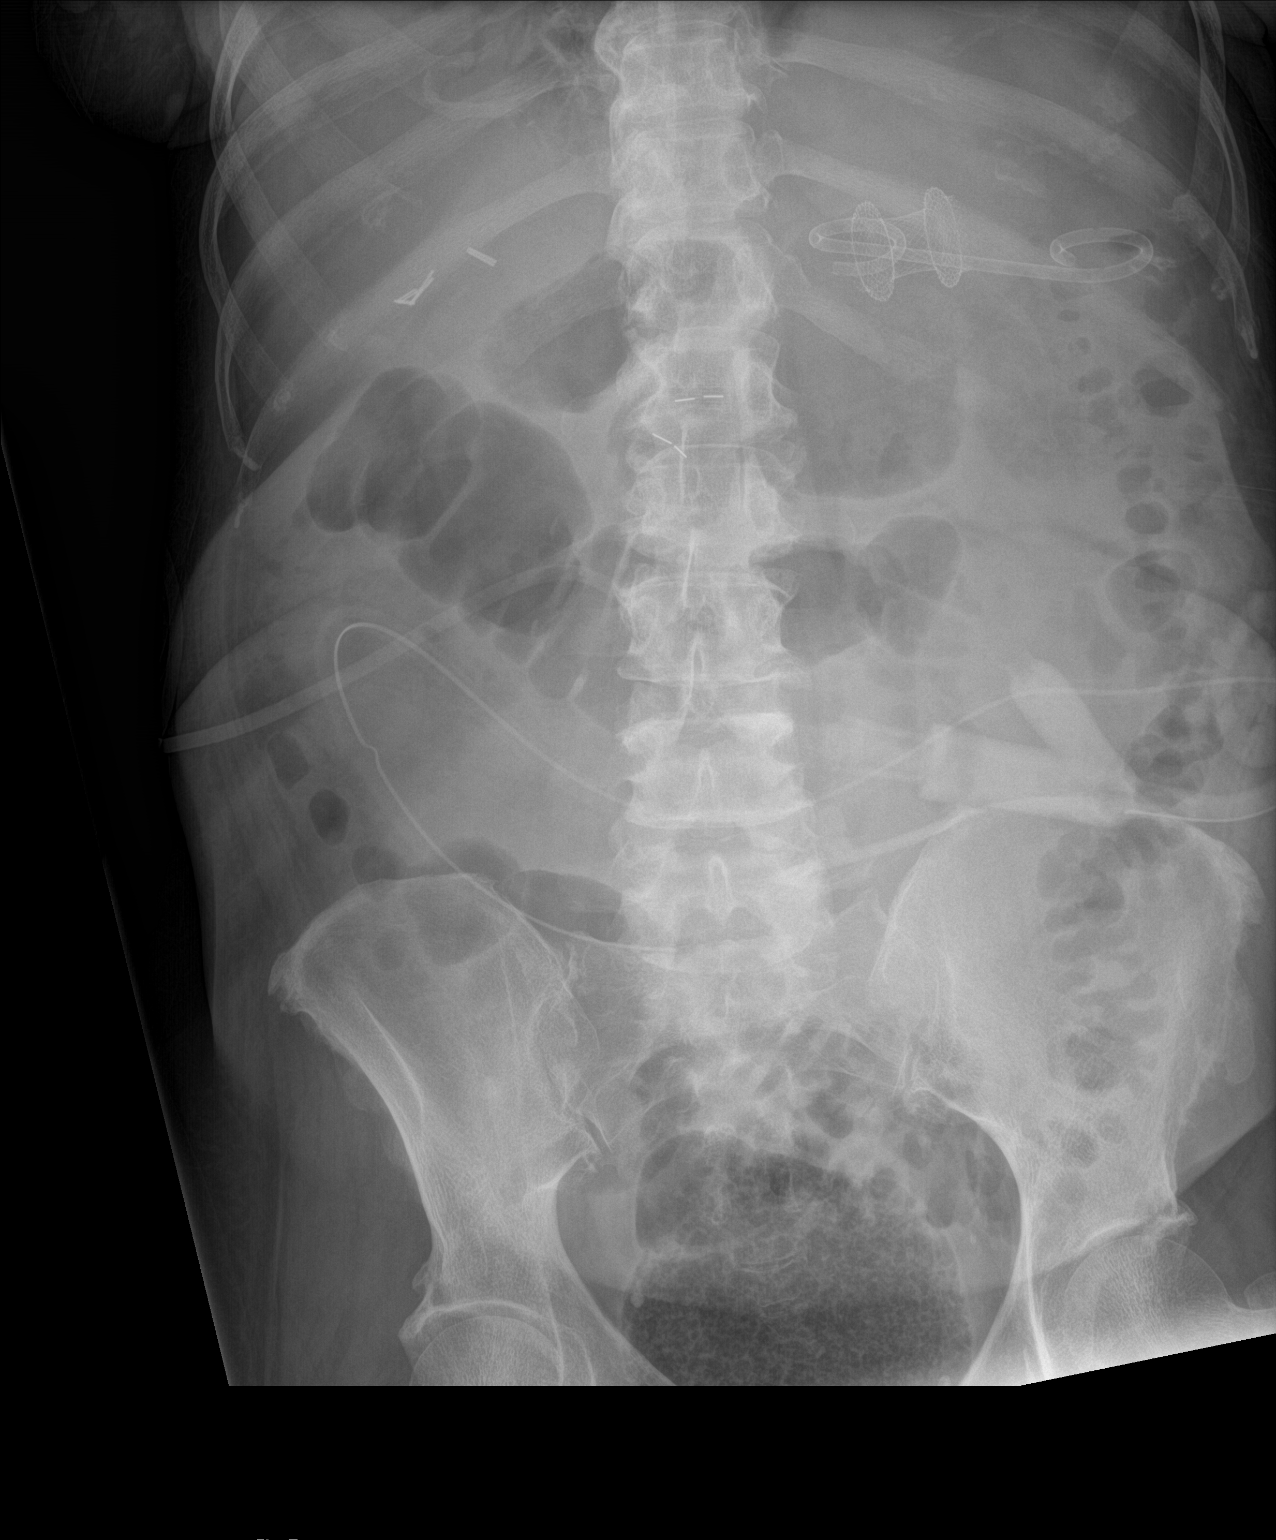

[2 of 2 positions shown; findings below may reference images not displayed]

FINDINGS: Catheter tip is in the left mid abdomen. There is mild dilatation of
the rectum due to fluid and stool. Elsewhere, there is no
appreciable bowel dilatation. No air-fluid level. There are surgical
clips in the upper abdomen. A drain is noted in the medial left
upper abdomen. No free air evident.
IMPRESSION: Catheter tip in lateral left mid abdomen region. Drain and left
upper quadrant in or overlying the stomach. Rectum distended with
fluid and stool. No bowel obstruction or free air demonstrable.
